# Patient Record
Sex: Male | Born: 1955 | Race: Black or African American | Hispanic: No | Marital: Single | Smoking: Never smoker
Health system: Southern US, Community
[De-identification: ages and names within clinical notes are randomized; demographics above are authoritative.]

## PROBLEM LIST (undated history)

## (undated) DIAGNOSIS — I10 Essential (primary) hypertension: Secondary | ICD-10-CM

## (undated) DIAGNOSIS — E785 Hyperlipidemia, unspecified: Secondary | ICD-10-CM

## (undated) DIAGNOSIS — G4733 Obstructive sleep apnea (adult) (pediatric): Secondary | ICD-10-CM

## (undated) DIAGNOSIS — I499 Cardiac arrhythmia, unspecified: Secondary | ICD-10-CM

## (undated) DIAGNOSIS — I2510 Atherosclerotic heart disease of native coronary artery without angina pectoris: Secondary | ICD-10-CM

## (undated) DIAGNOSIS — D649 Anemia, unspecified: Secondary | ICD-10-CM

## (undated) DIAGNOSIS — J301 Allergic rhinitis due to pollen: Secondary | ICD-10-CM

## (undated) DIAGNOSIS — E119 Type 2 diabetes mellitus without complications: Secondary | ICD-10-CM

## (undated) HISTORY — DX: Obstructive sleep apnea (adult) (pediatric): G47.33

## (undated) HISTORY — PX: PROSTATE BIOPSY: SHX241

## (undated) HISTORY — DX: Hyperlipidemia, unspecified: E78.5

## (undated) HISTORY — DX: Essential (primary) hypertension: I10

## (undated) HISTORY — DX: Allergic rhinitis due to pollen: J30.1

---

## 2001-05-12 ENCOUNTER — Encounter: Payer: Self-pay | Admitting: Family Medicine

## 2001-05-12 ENCOUNTER — Ambulatory Visit (HOSPITAL_COMMUNITY): Admission: RE | Admit: 2001-05-12 | Discharge: 2001-05-12 | Payer: Self-pay | Admitting: Family Medicine

## 2003-07-14 HISTORY — PX: OTHER SURGICAL HISTORY: SHX169

## 2004-02-06 ENCOUNTER — Ambulatory Visit (HOSPITAL_COMMUNITY): Admission: RE | Admit: 2004-02-06 | Discharge: 2004-02-06 | Payer: Self-pay | Admitting: Internal Medicine

## 2004-08-05 ENCOUNTER — Ambulatory Visit: Payer: Self-pay | Admitting: Internal Medicine

## 2004-08-11 ENCOUNTER — Ambulatory Visit: Payer: Self-pay | Admitting: Internal Medicine

## 2004-10-06 ENCOUNTER — Ambulatory Visit: Payer: Self-pay | Admitting: Internal Medicine

## 2004-10-28 ENCOUNTER — Ambulatory Visit: Payer: Self-pay | Admitting: Internal Medicine

## 2005-03-31 ENCOUNTER — Ambulatory Visit: Payer: Self-pay | Admitting: Internal Medicine

## 2005-07-29 ENCOUNTER — Ambulatory Visit: Payer: Self-pay | Admitting: Internal Medicine

## 2006-05-25 ENCOUNTER — Ambulatory Visit: Payer: Self-pay | Admitting: Internal Medicine

## 2006-07-02 ENCOUNTER — Ambulatory Visit: Payer: Self-pay | Admitting: Internal Medicine

## 2006-11-02 ENCOUNTER — Ambulatory Visit: Payer: Self-pay | Admitting: Internal Medicine

## 2007-04-22 ENCOUNTER — Ambulatory Visit: Payer: Self-pay | Admitting: Internal Medicine

## 2007-04-23 DIAGNOSIS — J3089 Other allergic rhinitis: Secondary | ICD-10-CM

## 2007-04-23 DIAGNOSIS — J302 Other seasonal allergic rhinitis: Secondary | ICD-10-CM

## 2007-06-27 ENCOUNTER — Ambulatory Visit: Payer: Self-pay | Admitting: Internal Medicine

## 2007-09-16 ENCOUNTER — Ambulatory Visit: Payer: Self-pay | Admitting: Internal Medicine

## 2008-01-31 ENCOUNTER — Ambulatory Visit: Payer: Self-pay | Admitting: Internal Medicine

## 2008-06-27 ENCOUNTER — Ambulatory Visit: Payer: Self-pay | Admitting: Internal Medicine

## 2008-08-14 ENCOUNTER — Ambulatory Visit: Payer: Self-pay | Admitting: Internal Medicine

## 2008-08-14 DIAGNOSIS — I10 Essential (primary) hypertension: Secondary | ICD-10-CM | POA: Insufficient documentation

## 2008-08-14 DIAGNOSIS — E785 Hyperlipidemia, unspecified: Secondary | ICD-10-CM | POA: Insufficient documentation

## 2008-12-07 ENCOUNTER — Ambulatory Visit: Payer: Self-pay | Admitting: Internal Medicine

## 2009-03-22 ENCOUNTER — Telehealth (INDEPENDENT_AMBULATORY_CARE_PROVIDER_SITE_OTHER): Payer: Self-pay | Admitting: *Deleted

## 2009-05-02 ENCOUNTER — Ambulatory Visit: Payer: Self-pay | Admitting: Internal Medicine

## 2009-08-04 ENCOUNTER — Emergency Department (HOSPITAL_COMMUNITY): Admission: EM | Admit: 2009-08-04 | Discharge: 2009-08-04 | Payer: Self-pay | Admitting: Emergency Medicine

## 2009-08-04 IMAGING — CR DG LUMBAR SPINE 2-3V
3 series · 3 of 3 positions shown · non-contrast
Comparison: None

CLINICAL DATA: Low back pain.

LUMBAR SPINE - 2-3 VIEW

[view not recorded (1 of 3)]
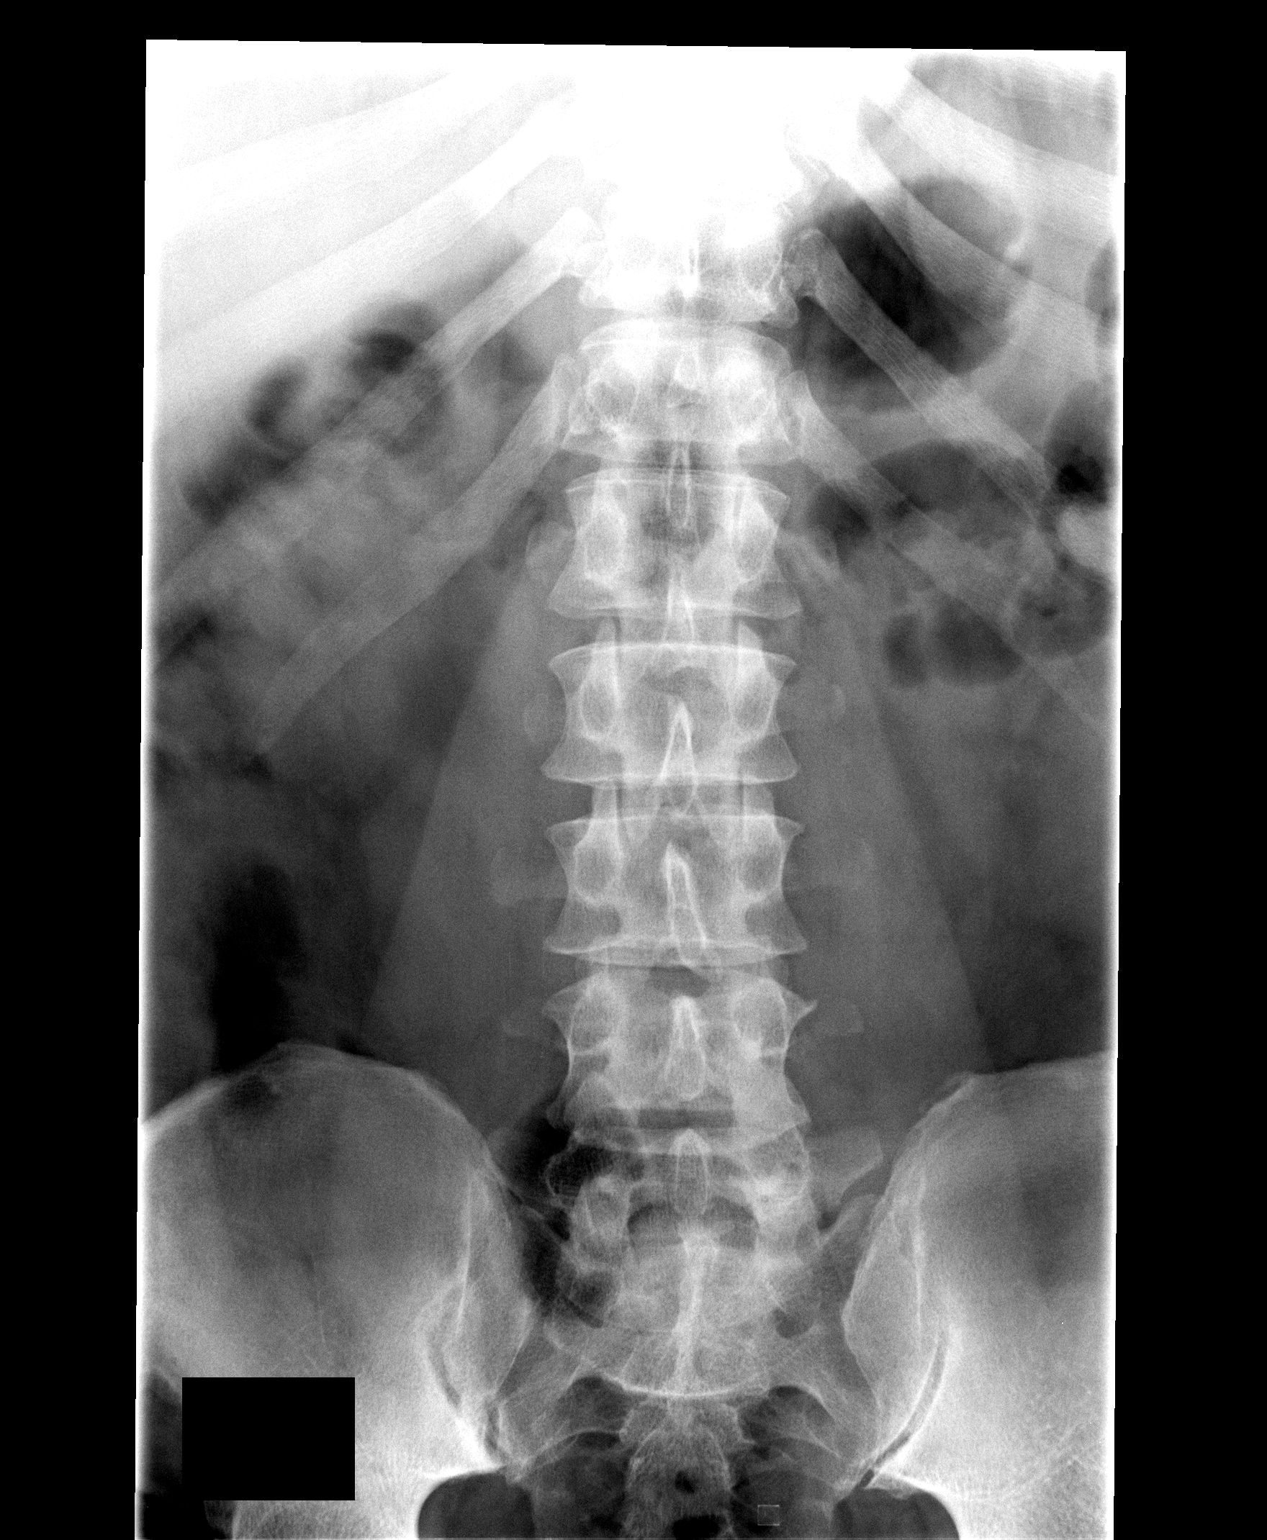

[view not recorded (2 of 3)]
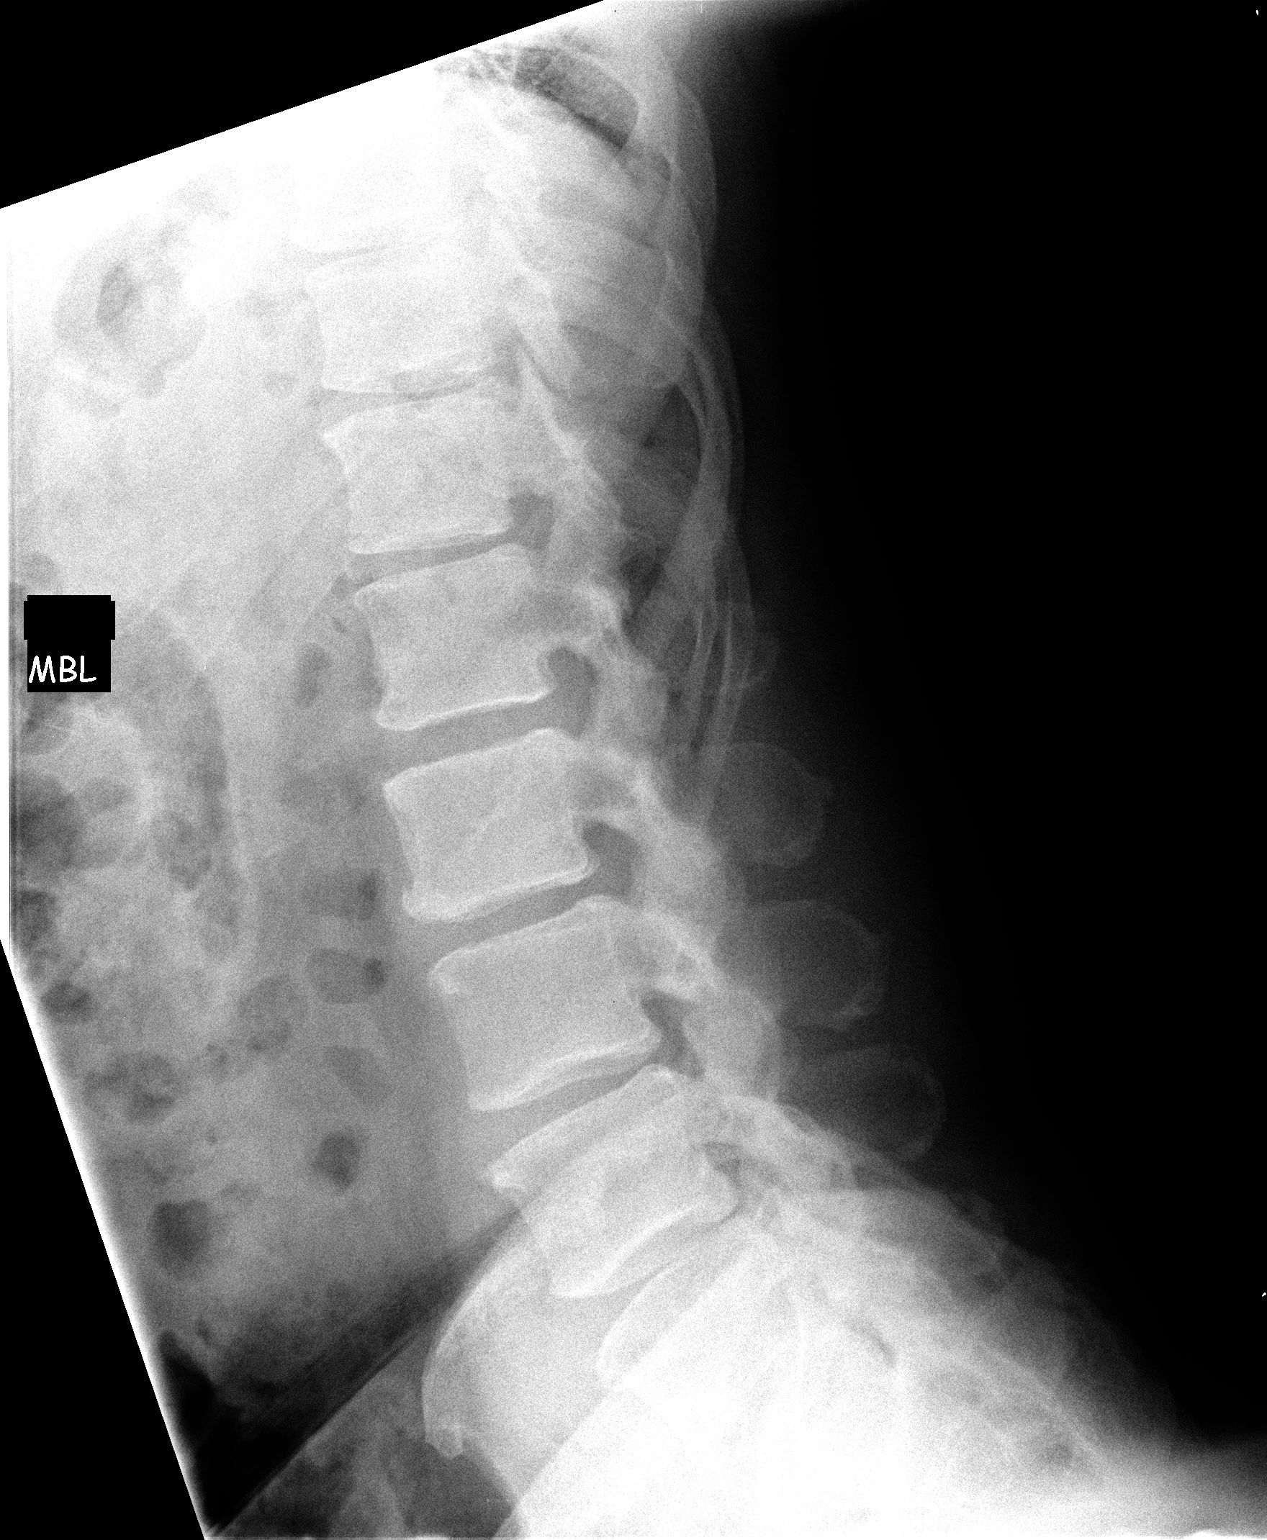

[view not recorded (3 of 3)]
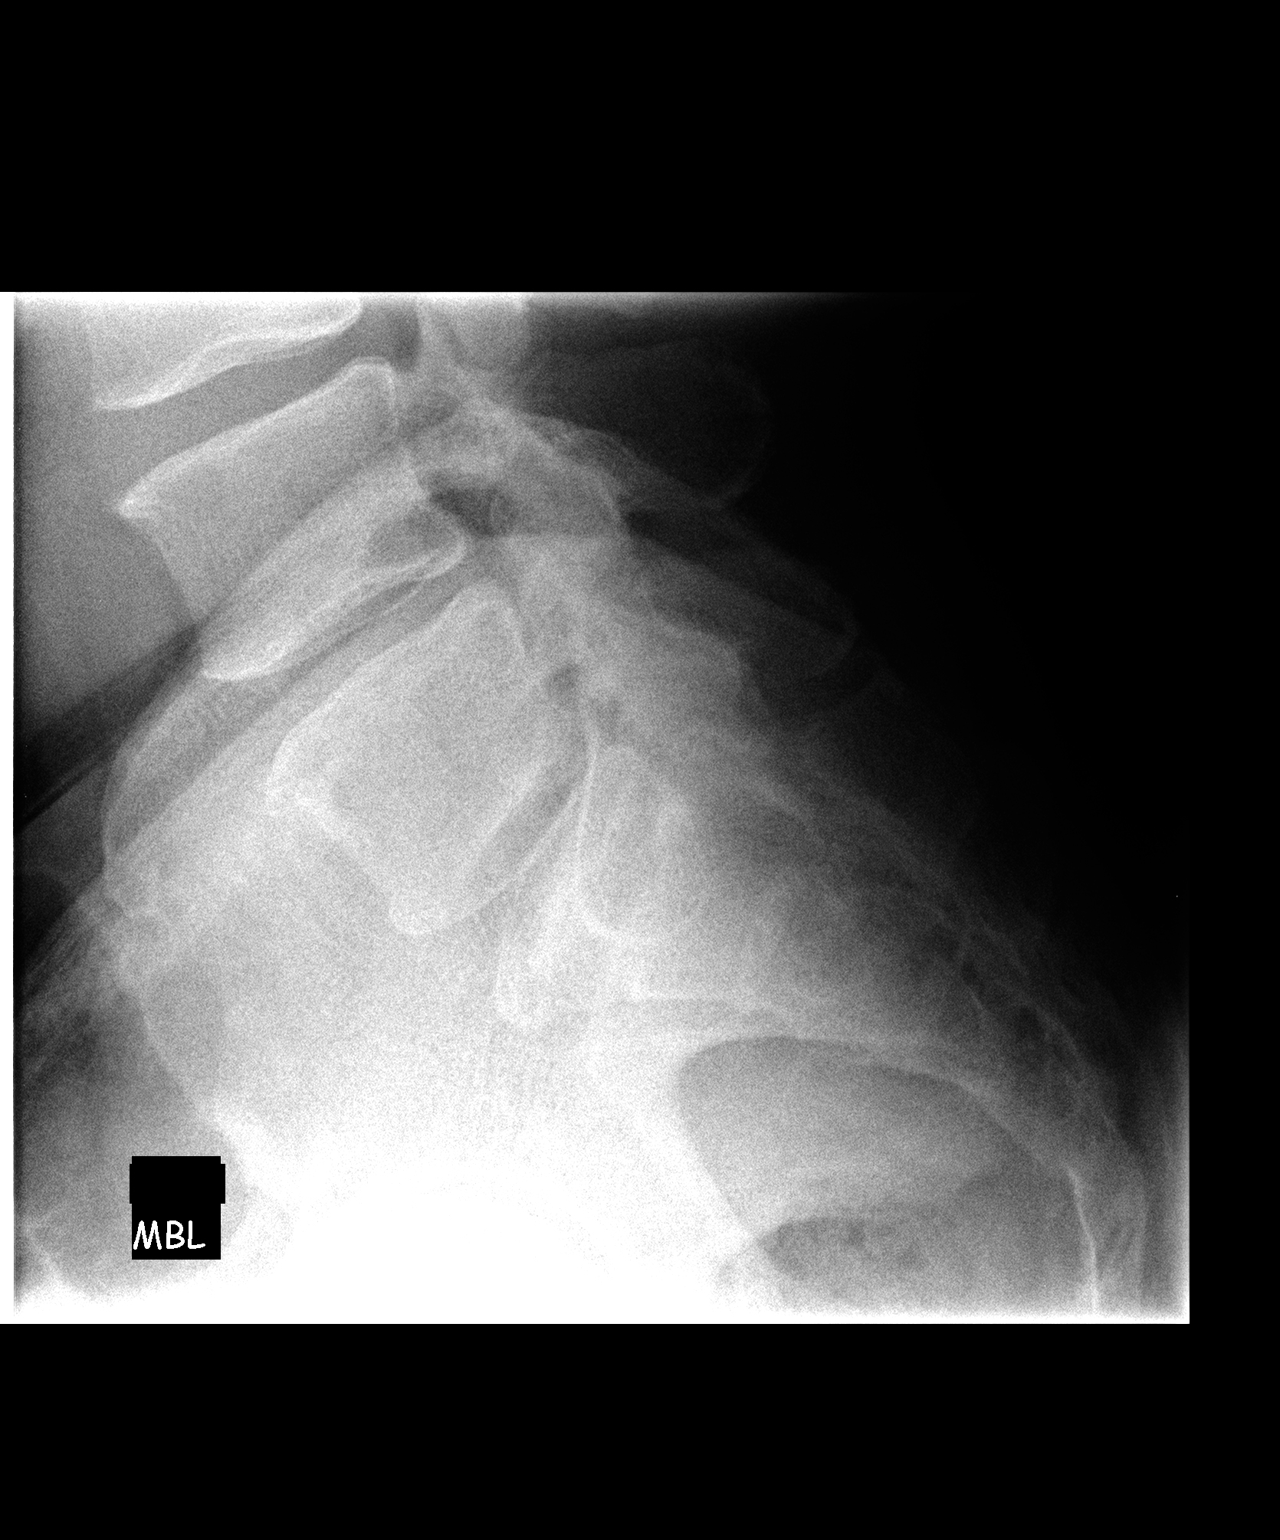

[3 of 3 positions shown; findings below may reference images not displayed]

FINDINGS: There is no evidence of lumbar spine fracture or
subluxation.  Mild degenerative disc disease is seen from levels of
L2-L5. No other significant bone abnormality identified.
IMPRESSION: 1.  No acute findings.
2.  Mild degenerative disc disease from levels of L2-L5.

## 2009-08-13 ENCOUNTER — Ambulatory Visit: Payer: Self-pay | Admitting: Internal Medicine

## 2009-08-14 ENCOUNTER — Emergency Department (HOSPITAL_COMMUNITY): Admission: EM | Admit: 2009-08-14 | Discharge: 2009-08-15 | Payer: Self-pay | Admitting: Emergency Medicine

## 2009-08-14 IMAGING — CR DG LUMBAR SPINE COMPLETE 4+V
5 series · 5 of 5 positions shown · non-contrast
Comparison: [DATE].

CLINICAL DATA: 53-year-old male with low back pain.  No known
injury.  No improvement.

LUMBAR SPINE - COMPLETE 4+ VIEW

[view not recorded (1 of 5)]
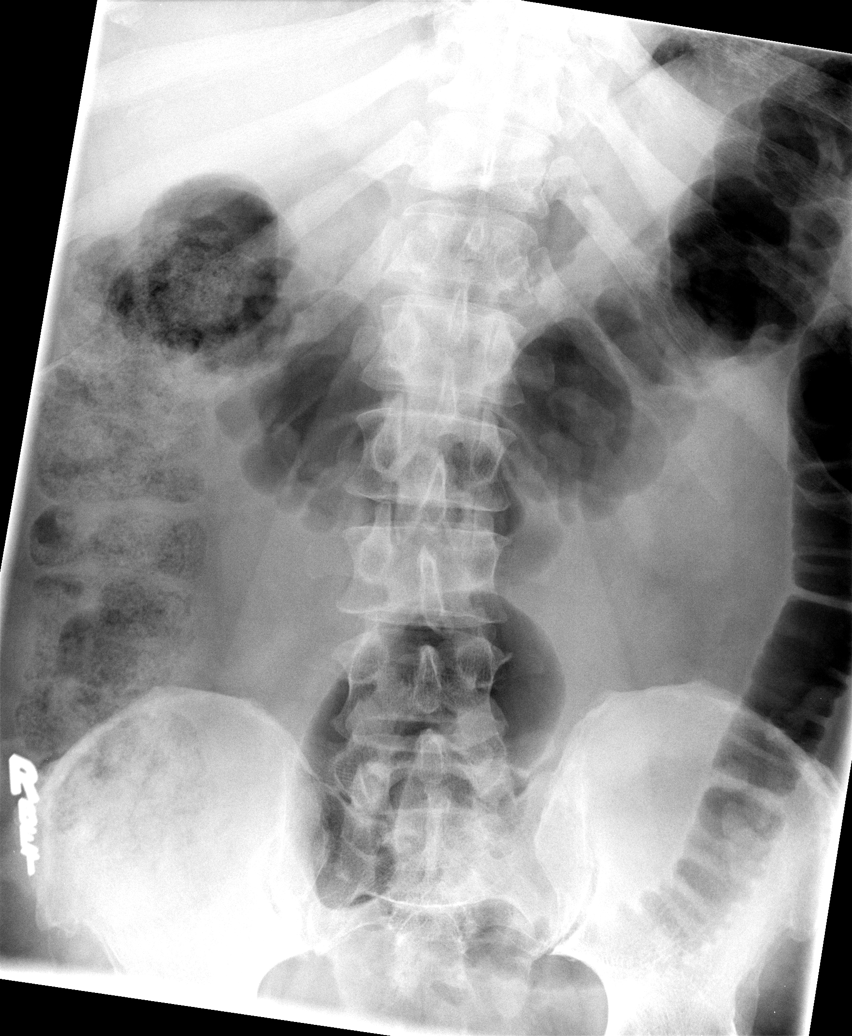

[view not recorded (2 of 5)]
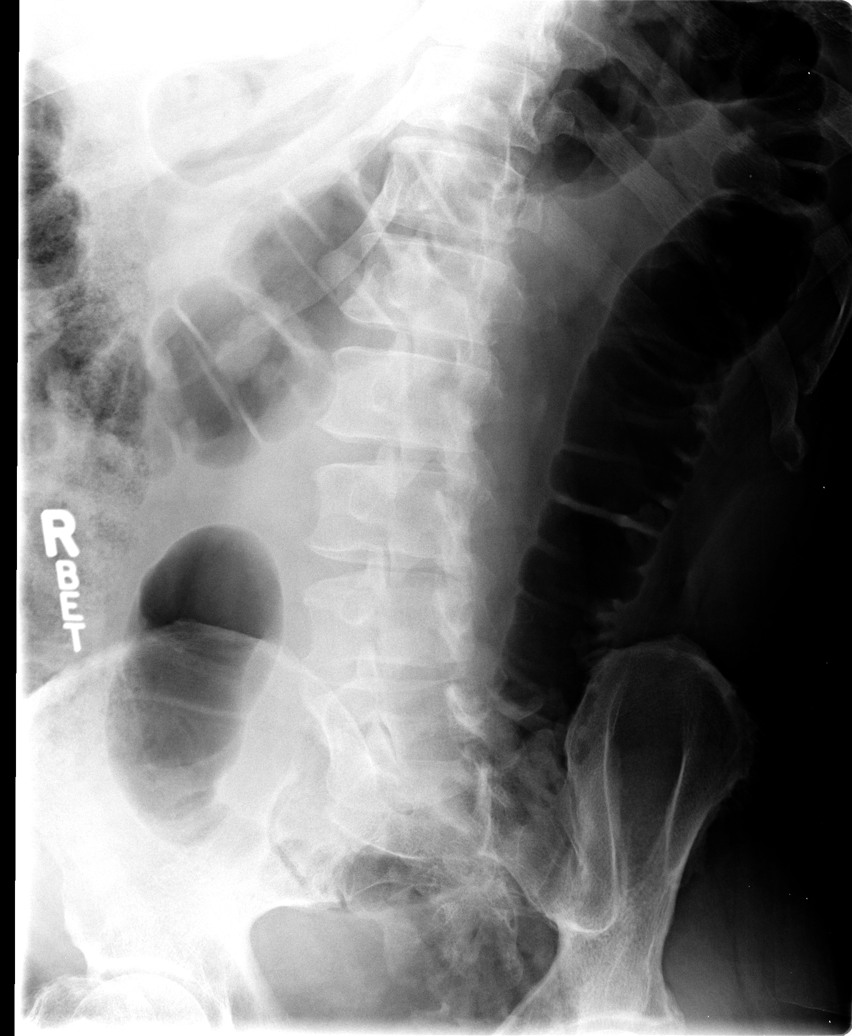

[view not recorded (3 of 5)]
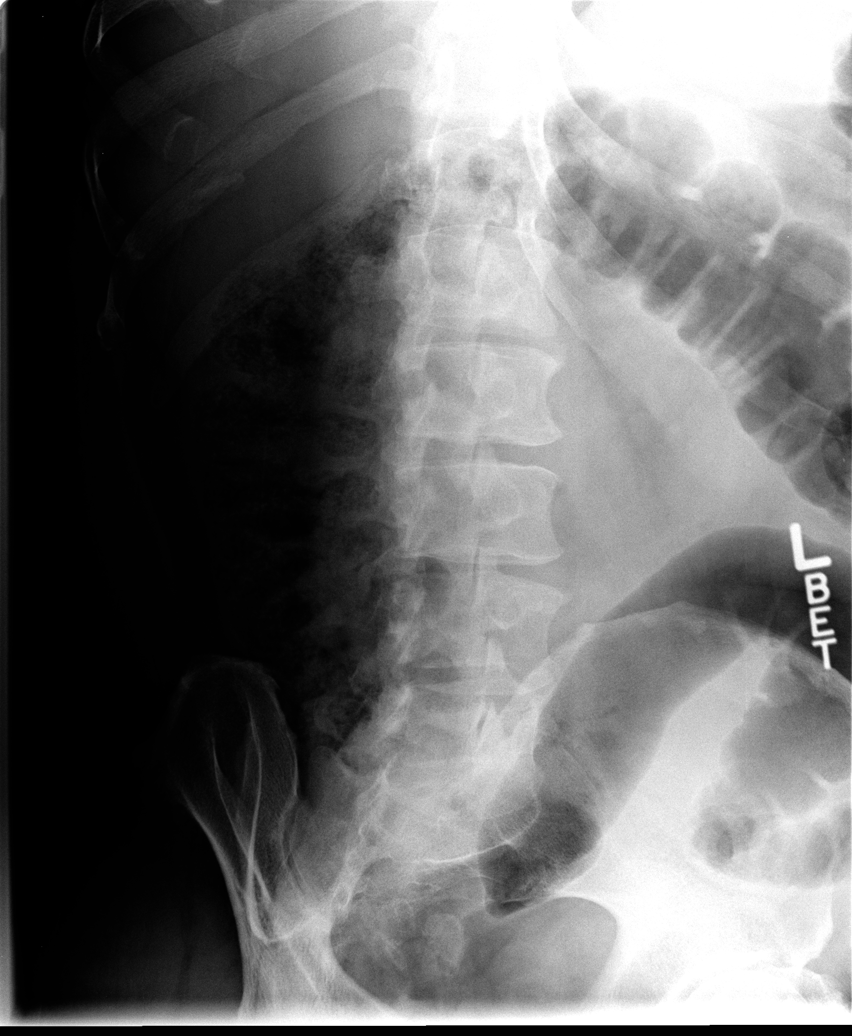

[view not recorded (4 of 5)]
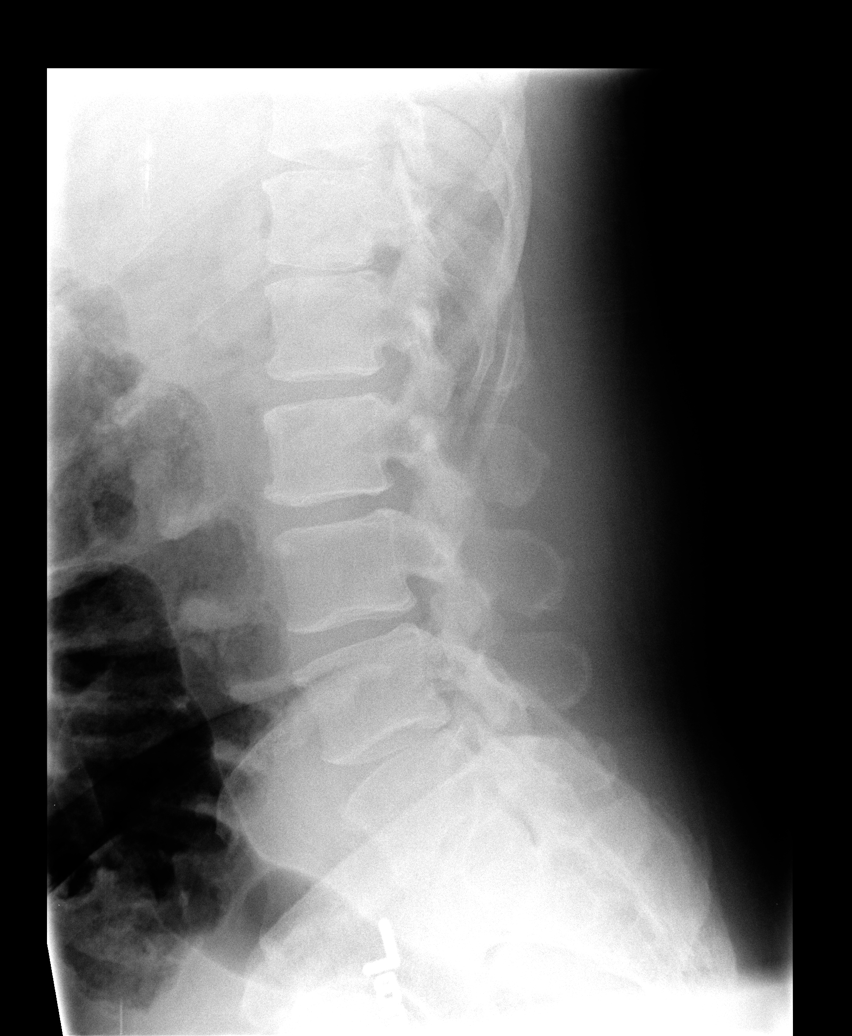

[view not recorded (5 of 5)]
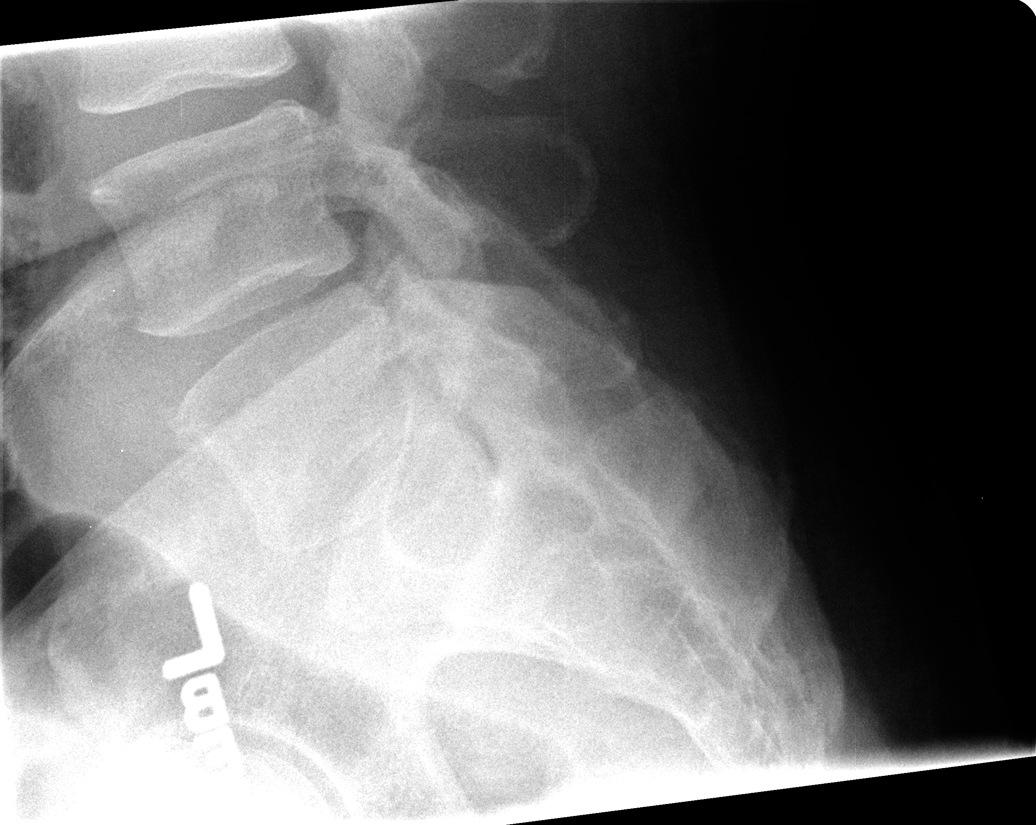

[5 of 5 positions shown; findings below may reference images not displayed]

FINDINGS: Stable, normal vertebral body height and alignment.
Normal lumbar segmentation again noted.  No pars fracture.
Relatively preserved disc spaces are stable.  Stable visualized
lower thoracic and pelvis structures.
IMPRESSION: No acute osseous abnormality identified in the lumbar spine.

## 2009-10-21 ENCOUNTER — Ambulatory Visit: Payer: Self-pay | Admitting: Internal Medicine

## 2010-03-12 ENCOUNTER — Ambulatory Visit: Payer: Self-pay | Admitting: Internal Medicine

## 2010-08-11 ENCOUNTER — Telehealth (INDEPENDENT_AMBULATORY_CARE_PROVIDER_SITE_OTHER): Payer: Self-pay | Admitting: *Deleted

## 2010-08-12 ENCOUNTER — Ambulatory Visit
Admission: RE | Admit: 2010-08-12 | Discharge: 2010-08-12 | Payer: Self-pay | Source: Home / Self Care | Attending: Internal Medicine | Admitting: Internal Medicine

## 2010-08-12 NOTE — Assessment & Plan Note (Signed)
Summary: 12 months/apc   Primary Provider/Referring Provider:  Alla German  CC:  follow up visit-no complaints..  History of Present Illness: 06/15/07- History of Present Illness: Allergic rhinitis follow-up Conntinues allergy vaccine 1:50 at [facility] in [place] able to work outside. 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 In summer freshcut grass, fall leaves transient nasal stuffiness.No need antihistami esw etc. No longer defined seasonal problems. Some nasal congestion in AMs, clear by 10:00AM. All nasal- no purulent, no headache, eyes ok. Never wheeze.  08/14/08- Allergic rhinitis Continues to do  very well with allergy vaccine. Gets at 1:50 given at his primary office in [place] with no concerns or reactions. Occasional minor cough, not bothersome and no wheeze or asthma concern. No longer seasonal symptoms- more mildly perennial.  August 13, 2009- Allergic rhinitis  He continues to get his allergy injections at medical office in [place] and says he is doing very well.  I noted his lisinopril- he denies any cough or hives. He has no reactions to his shots.      Current Medications (verified): 1)  Allergy Vaccine 1: 10 .... Gets Inj in [place] 2)  Aspirin 81 Mg Tbec (Aspirin) .... Take 1 By Mouth Once Daily 3)  Simvastatin 80 Mg  Tabs (Simvastatin) .... Use One By Mouth Once Daily 4)  Enalapril Maleate 20 Mg  Tabs (Enalapril Maleate) 5)  Hydrochlorothiazide 25 Mg  Tabs (Hydrochlorothiazide) .... Take 1/4 Tablet By Mouth Once Daily 6)  Nexium 40 Mg  Cpdr (Esomeprazole Magnesium) .... Take 1 By Mouth Once Daily 7)  Vitamin C-Rose Hips 500 Mg  Tabs (Ascorbic Acid) .... Take 1 By Mouth Once Daily 8)  B-100 Complex   Tabs (Vitamins-Lipotropics) .... Take 1 By Mouth Once Daily 9)  Diltiazem Hcl Coated Beads 240 Mg Xr24h-Cap (Diltiazem Hcl Coated Beads) .... 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         He is guarding sore left hip today Educated on potential for sleep apnea.  Vital Signs:  Patient profile:   55 year old male Weight:      270.25 pounds O2 Sat:      100 % on Room air Pulse rate:   70 / minute BP sitting:   124 / 80  (left arm) Cuff size:   large  Vitals Entered By: Reynaldo Minium CMA (August 13, 2009 3:05 PM)  O2 Flow:  Room air  Physical Exam  Additional Exam:  General: A/Ox3; pleasant and cooperative, NAD, overweight, SKIN: no rash, lesions NODES: no lymphadenopathy HEENT: [place]/AT, EOM- WNL, Conjuctivae- clear, PERRLA, TM-WNL, Nose- clear, Throat- clear and wnl, Melampatti II NECK: Supple w/ fair ROM, JVD- none, normal carotid impulses w/o bruits Thyroid- normal to palpation CHEST: Clear to P&A HEART: RRR, no m/g/r heard ABDOMEN: Soft and  nl; EAV:WUJW, nl pulses, no edema  NEURO: Grossly intact to observation      Impression & Recommendations:  Problem # 1:  RHINITIS, ALLERGIC, 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 Take 1 by mouth once daily  Other Orders: Est. Patient Level II  (11914)  Patient Instructions: 1)  Schedule return in one year, earlier if needed 2)  Continue allergy  vaccine. Call if you have questions or problems.

## 2010-08-20 NOTE — Progress Notes (Signed)
Summary: allergy medication  Phone Note Call from Patient   Caller: Patient Call For: DR YOUNG Summary of Call: Patient phoned stated that he has an appointment tomorrow with Dr. Maple Hudson and he is out of his allegry injections and he has lost the little form sheet. He has no idea where he put it. He wants to know if it can pick it up tomorrow at his appointment. He can be reached at [phone]  Initial call taken by: Vedia Coffer,  August 11, 2010 10:03 AM  Follow-up for Phone Call        Will forward message to Tammy S in [facility] so she can pull this info for pt to pick up tomorrow. Gweneth Dimitri RN  August 11, 2010 10:51 AM   Additional Follow-up for Phone Call Additional follow up Details #1::        Called pt. this morning left a message. He called me back; I told him we could have his vaccine ready for him to pick-up after his appt.tomorrow. Additional Follow-up by: Dimas Millin,  August 11, 2010 4:57 PM

## 2010-08-20 NOTE — Assessment & Plan Note (Signed)
Summary: 12 months/apc   Primary Provider/Referring Provider:  Gareth Morgan  CC:  55 yr followup and coughing  x 2 weeksat night much better this week .  History of Present Illness: 08/14/08- Allergic rhinitis Continues to do  very well with allergy vaccine. Gets at 1:50 given at his primary office in [place] with no concerns or reactions. Occasional minor cough, not bothersome and no wheeze or asthma concern. No longer seasonal symptoms- more mildly perennial.  August 13, 2009- Allergic rhinitis  He continues to get his allergy injections at medical office in [place] and says he is doing very well.  I noted his lisinopril- he denies any cough or hives. He has no reactions to his shots.  August 12, 2010-  Allergic rhinitis Nurse-CC: 55 yr followup, coughing  x 2 weeks at night much better this week  Allergy vaccine is given with no problems through a  clinic in [place] and he says this works very well.  Recent sinus cold x weeks- improving with otc remedies. He again denies chronic cough or issues pertinent to his ACE inhibitor. Denies hx of asthma. 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 He has no desire to change current management.     Preventive Screening-Counseling & Management  Alcohol-Tobacco     Smoking Status: never  Current Medications (verified): 1)  Allergy Vaccine 1: 10 .... Gets Inj in [place] 2)  Aspirin 81 Mg Tbec (Aspirin) .... Take 1 By Mouth Once Daily 3)  Simvastatin 80 Mg  Tabs (Simvastatin) .... Use One By Mouth Once Daily 4)  Enalapril Maleate 20 Mg  Tabs (Enalapril Maleate) 5)  Hydrochlorothiazide 50 Mg Tabs (Hydrochlorothiazide) .... 1/4 Tab Daily 6)  Nexium 40 Mg  Cpdr (Esomeprazole Magnesium) .... Take 1 By Mouth Once Daily 7)  Vitamin C-Rose Hips 500 Mg  Tabs (Ascorbic Acid) .... Take 1 By Mouth Once Daily 8)  B-100 Complex   Tabs (Vitamins-Lipotropics) .... Take 1 By Mouth Once Daily 9)  Diltiazem Hcl Coated Beads 240 Mg  Xr24h-Cap (Diltiazem Hcl Coated Beads) .... 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  The patient denies shortness of breath with activity, shortness of breath at rest, productive cough, coughing up blood, chest pain, irregular heartbeats, acid heartburn, indigestion, loss of appetite, weight change, abdominal pain, difficulty swallowing, sore throat, tooth/dental problems, headaches, and sneezing.    Vital Signs:  Patient profile:   55 year old male Height:      67 inches Weight:      271.38 pounds BMI:     42.66 BP sitting:   130 / 62  (left arm) Cuff size:   large  Vitals Entered By: Reynaldo Minium CMA (August 12, 2010 3:12 PM) CC: 55 yr followup, coughing  x 2 weeksat night much better this week    Physical Exam  Additional Exam:  General: A/Ox3;  pleasant and cooperative, NAD, overweight, SKIN: no rash, lesions NODES: no lymphadenopathy HEENT: /AT, EOM- WNL, Conjuctivae- clear, PERRLA, TM-WNL, Nose- clear, Throat- clear and wnl, Mallampati III-IV NECK: Supple w/ fair ROM, JVD- none, normal carotid impulses w/o bruits Thyroid- normal to palpation CHEST: Clear to P&A HEART: RRR, no m/g/r heard ABDOMEN: Soft and nl; ZHY:QMVH, nl pulses, no edema  NEURO: Grossly intact to observation      Impression &  Recommendations:  Problem # 1:  RHINITIS, ALLERGIC, 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 I don't think he is having problems with cough from enalapril, but cough is common enough ACEI side effect to discuss with that in mind Incidental cold now, is clearing.  Watch for sleep apnea because of body habitus. He denies tiredness, witnessed apnea, or concern.    Orders: Est. Patient Level III (84696)  Medications Added to Medication List This Visit: 1)  Hydrochlorothiazide 50 Mg Tabs (Hydrochlorothiazide) .... 1/4 tab daily  Patient Instructions: 1)  Please schedule a follow-up appointment in 1 year. Please call as needed. 2)  Continue allergy vaccine

## 2010-11-28 NOTE — Op Note (Signed)
NAME:  Andrew Arnold, Andrew Arnold                      ACCOUNT NO.:  [id number]   MEDICAL RECORD NO.:  [id number]                   PATIENT TYPE:  AMB   LOCATION:  DAY                                  FACILITY:  [facility]   PHYSICIAN:  R. Roetta Sessions, M.D.              DATE OF BIRTH:  01/12/1956   DATE OF PROCEDURE:  02/06/2004  DATE OF DISCHARGE:                                 OPERATIVE REPORT   INDICATIONS FOR PROCEDURE:  The patient is a 55 year old gentleman devoid  any upper GI tract symptoms.  He was sent __________ by Dr. Gareth Morgan  for colorectal screening via colonoscopy.  There is no family history of  colorectal neoplasia and he has never had his colon examined.  This  colonoscopy is now being done as a standard for screening maneuver.  The  __________ potential risks, benefits, and alternatives have been reviewed,  please see my handwritten H&P.   PROCEDURE:  Screening colonoscopy.   PROCEDURE NOTE:  O2 saturation, blood pressure, pulse, and respirations were  monitored throughout the entire procedure.   CONSCIOUS SEDATION:  Versed 3 mg IV and Demerol 75 mg IV.   INSTRUMENT:  Olympus video chip system.   FINDINGS:  Digital rectal examination revealed no abnormalities.  Endoscopic  findings:  The prep was excellent.  In the rectum, examination of the rectal  mucosa, including retroflexion to the anal verge revealed no abnormalities.   COLON:  The colonic mucosa was surveyed from the rectosigmoid junction  through the left, transverse and right colon to the appendiceal orifice,  ileocecal valve, and cecum.  The terminal ileum was easily intubated as  well.  Pictures of the ileocecal valve, cecum, and appendiceal orifice were  taken for the record.  From this level, the scope was slowly withdrawn and  all previously mentioned mucosal surfaces were again seen.  There was a  straight shot to the cecum, the scope was easily advanced to the cecum and  back, and the colonic  mucosa appeared entirely normal except for one or two  small sigmoid diverticula.  The terminal ileum appeared normal.   The patient tolerated the procedure well and was reacted after endoscopy.   IMPRESSION:  1. Normal rectum.  2. A couple of sigmoid diverticula in colonic mucosa and terminal ileum     mucosa appeared normal.   RECOMMENDATIONS:  Repeat colonoscopy in 10 years.      ___________________________________________                                            Andrew Arnold, M.D.   RMR/MEDQ  D:  02/06/2004  T:  02/06/2004  Job:  [id number]   cc:   Mila Homer. Sudie Bailey, M.D.  [address] [place], [place] [address]  Fax: [phone]

## 2010-11-28 NOTE — Assessment & Plan Note (Signed)
[facility]                             PULMONARY OFFICE NOTE   NAME:Andrew Arnold, Andrew Arnold                   MRN:          [id number]  DATE:07/02/2006                            DOB:          11/25/1955    PROBLEM:  Allergic rhinitis.   HISTORY:  Andrew Arnold comes in for a one-year followup, doing well.  He  feels that his allergy vaccine control is good enough on the current  strength at 1:50, with no reactions or problems.  He gets his shots at  his primary office, [facility] in [place].  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  No asthma.  He says he can tell is he misses a shot, because he gets  more head congestion.  Overall there has been no progression.  He is  feeling very stable.   MEDICATIONS:  1. Allergy vaccine.  2. Multivitamins.  3. Aspirin 325 mg.  4. Hydrochlorothiazide.  5. Simvastatin 80 mg.  6. Enalapril 20 mg.   ALLERGIES:  No known drug allergies.   OBJECTIVE:  VITAL SIGNS:  Weight 250 pounds, blood pressure 122/64,  pulse regular 67, room air saturation 97%.  HEENT:  There is turbinate edema with clear secretions.  Palate length  is 3 to 4/4.  I discussed warning signals for sleep apnea for him to  watch for, based on his body build.  LUNGS:  Very clear.  HEART:  Sounds regular, without murmur or gallop.   IMPRESSION:  Allergic rhinitis, under stable control.   PLAN:  To continue the vaccine at 1:50.  Schedule a return in one year,  earlier p.r.n.     Clinton D. Maple Hudson, MD, Tonny Bollman, FACP  Electronically Signed    CDY/MedQ  DD: 07/07/2006  DT: 07/07/2006  Job #: [id number]

## 2010-12-11 ENCOUNTER — Ambulatory Visit (INDEPENDENT_AMBULATORY_CARE_PROVIDER_SITE_OTHER): Payer: Self-pay

## 2010-12-11 DIAGNOSIS — J309 Allergic rhinitis, unspecified: Secondary | ICD-10-CM

## 2011-04-16 ENCOUNTER — Ambulatory Visit (INDEPENDENT_AMBULATORY_CARE_PROVIDER_SITE_OTHER): Payer: Self-pay

## 2011-04-16 DIAGNOSIS — J309 Allergic rhinitis, unspecified: Secondary | ICD-10-CM

## 2011-08-12 ENCOUNTER — Encounter: Payer: Self-pay | Admitting: Internal Medicine

## 2011-08-13 ENCOUNTER — Encounter: Payer: Self-pay | Admitting: Internal Medicine

## 2011-08-13 ENCOUNTER — Ambulatory Visit (INDEPENDENT_AMBULATORY_CARE_PROVIDER_SITE_OTHER): Payer: BC Managed Care – PPO | Admitting: Internal Medicine

## 2011-08-13 VITALS — BP 128/76 | HR 76 | Ht 67.0 in | Wt 283.6 lb

## 2011-08-13 DIAGNOSIS — J301 Allergic rhinitis due to pollen: Secondary | ICD-10-CM

## 2011-08-13 NOTE — Progress Notes (Signed)
08/13/11- 55 yoM never smoker followed for allergic rhinitis complicated by hypertension He continues to get his allergy vaccine injections through a clinic in [place]. He is strongly convinced they help him. He says if he misses one or 2 shots, he begins to notice significant increase in nasal congestion and rhinorrhea. He has no lower respiratory problems including wheezing, chest pain or any cough attributable to his ACE inhibitor. He is prone to increased nasal symptoms season changes, especially spring and fall. We discussed use of a supplemental antihistamine if necessary.  ROS-see HPI Constitutional:   No-   weight loss, night sweats, fevers, chills, fatigue, lassitude. HEENT:   No-  headaches, difficulty swallowing, tooth/dental problems, sore throat,       No-  sneezing, itching, ear ache, nasal congestion, post nasal drip,  CV:  No-   chest pain, orthopnea, PND, swelling in lower extremities, anasarca, dizziness, palpitations Resp: No-   shortness of breath with exertion or at rest.              No-   productive cough,  No non-productive cough,  No- coughing up of blood.              No-   change in color of mucus.  No- wheezing.   Skin: No-   rash or lesions. GI:  No-   heartburn, indigestion, abdominal pain, nausea, vomiting, diarrhea,                 change in bowel habits, loss of appetite GU: MS:  No-   joint pain or swelling.  No- decreased range of motion.  No- back pain. Neuro-     nothing unusual Psych:  No- change in mood or affect. No depression or anxiety.  No memory loss.   OBJ- Physical Exam General- Alert, Oriented, Affect-appropriate, Distress- none acute, obese Skin- rash-none, lesions- none, excoriation- none Lymphadenopathy- none Head- atraumatic            Eyes- Gross vision intact, PERRLA, conjunctivae and secretions clear            Ears- Hearing, canals-normal            Nose- Clear, no-Septal dev, mucus, polyps, erosion, perforation   Throat- Mallampati II , mucosa clear , drainage- none, tonsils- atrophic Neck- flexible , trachea midline, no stridor , thyroid nl, carotid no bruit Chest - symmetrical excursion , unlabored           Heart/CV- RRR , no murmur , no gallop  , no rub, nl s1 s2                           - JVD- none , edema- none, stasis changes- none, varices- none           Lung- clear to P&A, wheeze- none, cough- none , dullness-none, rub- none           Chest wall-  Abd- tender-no, distended-no, bowel sounds-present, HSM- no Br/ Gen/ Rectal- Not done, not indicated Extrem- cyanosis- none, clubbing, none, atrophy- none, strength- nl Neuro- grossly intact to observation

## 2011-08-13 NOTE — Patient Instructions (Signed)
Continue allergy vaccine  Ok to ad an otc antihistamine like claritin/ zyrtec/ allegra at any time if needed

## 2011-08-13 NOTE — Assessment & Plan Note (Signed)
History please with his results on allergy vaccine and will continue indefinitely. He recognizes the difference between allergy exacerbation and viral respiratory infections, but he has avoided this winter.

## 2011-08-14 ENCOUNTER — Ambulatory Visit (INDEPENDENT_AMBULATORY_CARE_PROVIDER_SITE_OTHER): Payer: BC Managed Care – PPO

## 2011-08-14 DIAGNOSIS — J309 Allergic rhinitis, unspecified: Secondary | ICD-10-CM

## 2011-12-17 ENCOUNTER — Ambulatory Visit (INDEPENDENT_AMBULATORY_CARE_PROVIDER_SITE_OTHER): Payer: BC Managed Care – PPO

## 2011-12-17 DIAGNOSIS — J309 Allergic rhinitis, unspecified: Secondary | ICD-10-CM

## 2012-04-28 ENCOUNTER — Ambulatory Visit (INDEPENDENT_AMBULATORY_CARE_PROVIDER_SITE_OTHER): Payer: BC Managed Care – PPO

## 2012-04-28 DIAGNOSIS — J309 Allergic rhinitis, unspecified: Secondary | ICD-10-CM

## 2012-08-12 ENCOUNTER — Ambulatory Visit (INDEPENDENT_AMBULATORY_CARE_PROVIDER_SITE_OTHER): Payer: BC Managed Care – PPO | Admitting: Internal Medicine

## 2012-08-12 ENCOUNTER — Encounter: Payer: Self-pay | Admitting: Internal Medicine

## 2012-08-12 VITALS — BP 150/92 | HR 76 | Ht 67.0 in | Wt 262.2 lb

## 2012-08-12 DIAGNOSIS — J301 Allergic rhinitis due to pollen: Secondary | ICD-10-CM

## 2012-08-12 NOTE — Patient Instructions (Addendum)
We can continue allergy vaccine 1:10.   Please have your [facility] staff call us if there are any questions about your allergy shots.  It would be ok for you to add an antihistamine for nasal allergy if needed. Good over-the- counter ones would be Claritin/ loratadine or Allegra/ fexofenadine  Please call as needed

## 2012-08-12 NOTE — Progress Notes (Signed)
08/13/11- 55 yoM never smoker followed for allergic rhinitis complicated by hypertension He continues to get his allergy vaccine injections through a clinic in [place]. He is strongly convinced they help him. He says if he misses one or 2 shots, he begins to notice significant increase in nasal congestion and rhinorrhea. He has no lower respiratory problems including wheezing, chest pain or any cough attributable to his ACE inhibitor. He is prone to increased nasal symptoms season changes, especially spring and fall. We discussed use of a supplemental antihistamine if necessary.  08/12/12- 56 yoM never smoker followed for allergic rhinitis complicated by hypertension follows for :still on vaccine. still getting in [place].  Allergy vaccine 1:10 given at [facility] in [place], weekly without problems. He is satisfied that it helps. Spring and fall seasons are worst. No asthma. Risk-benefit discussion done.  ROS-see HPI Constitutional:   No-   weight loss, night sweats, fevers, chills, fatigue, lassitude. HEENT:   No-  headaches, difficulty swallowing, tooth/dental problems, sore throat,       No-  sneezing, itching, ear ache, nasal congestion, post nasal drip,  CV:  No-   chest pain, orthopnea, PND, swelling in lower extremities, anasarca, dizziness, palpitations Resp: No-   shortness of breath with exertion or at rest.              No-   productive cough,  No non-productive cough,  No- coughing up of blood.              No-   change in color of mucus.  No- wheezing.   Skin: No-   rash or lesions. GI:  No-   heartburn, indigestion, abdominal pain, nausea, vomiting,  GU: MS:  No-   joint pain or swelling.  . Neuro-     nothing unusual Psych:  No- change in mood or affect. No depression or anxiety.  No memory loss.   OBJ- Physical Exam General- Alert, Oriented, Affect-appropriate, Distress- none acute, obese Skin- rash-none, lesions- none, excoriation- none Lymphadenopathy-  none Head- atraumatic            Eyes- Gross vision intact, PERRLA, conjunctivae and secretions clear            Ears- Hearing, canals-normal            Nose- Clear, no-Septal dev, mucus, polyps, erosion, perforation             Throat- Mallampati III , mucosa clear , drainage- none, tonsils- atrophic Neck- flexible , trachea midline, no stridor , thyroid nl, carotid no bruit Chest - symmetrical excursion , unlabored           Heart/CV- RRR , no murmur , no gallop  , no rub, nl s1 s2                           - JVD- none , edema- none, stasis changes- none, varices- none           Lung- clear to P&A, wheeze- none, cough- none , dullness-none, rub- none           Chest wall-  Abd-  Br/ Gen/ Rectal- Not done, not indicated Extrem- cyanosis- none, clubbing, none, atrophy- none, strength- nl Neuro- grossly intact to observation

## 2012-08-15 ENCOUNTER — Ambulatory Visit (INDEPENDENT_AMBULATORY_CARE_PROVIDER_SITE_OTHER): Payer: BC Managed Care – PPO

## 2012-08-15 DIAGNOSIS — J309 Allergic rhinitis, unspecified: Secondary | ICD-10-CM

## 2012-08-20 NOTE — Assessment & Plan Note (Signed)
He believes he get symptomatic if he misses a couple of weeks. He intends to continue shots. Discussed safety. Discussed supplementation with antihistamine if needed

## 2012-12-14 ENCOUNTER — Ambulatory Visit (INDEPENDENT_AMBULATORY_CARE_PROVIDER_SITE_OTHER): Payer: BC Managed Care – PPO

## 2012-12-14 DIAGNOSIS — J309 Allergic rhinitis, unspecified: Secondary | ICD-10-CM

## 2013-04-28 ENCOUNTER — Ambulatory Visit (INDEPENDENT_AMBULATORY_CARE_PROVIDER_SITE_OTHER): Payer: BC Managed Care – PPO

## 2013-04-28 DIAGNOSIS — J309 Allergic rhinitis, unspecified: Secondary | ICD-10-CM

## 2013-08-23 ENCOUNTER — Ambulatory Visit (INDEPENDENT_AMBULATORY_CARE_PROVIDER_SITE_OTHER): Payer: BC Managed Care – PPO

## 2013-08-23 DIAGNOSIS — J309 Allergic rhinitis, unspecified: Secondary | ICD-10-CM

## 2013-11-12 ENCOUNTER — Emergency Department (HOSPITAL_COMMUNITY): Payer: BC Managed Care – PPO

## 2013-11-12 ENCOUNTER — Observation Stay (HOSPITAL_COMMUNITY)
Admission: EM | Admit: 2013-11-12 | Discharge: 2013-11-14 | Disposition: A | Discharge status: Home / Self Care | Payer: BC Managed Care – PPO | Attending: Family Medicine | Admitting: Family Medicine

## 2013-11-12 ENCOUNTER — Encounter (HOSPITAL_COMMUNITY): Payer: Self-pay | Admitting: Emergency Medicine

## 2013-11-12 DIAGNOSIS — Z7982 Long term (current) use of aspirin: Secondary | ICD-10-CM | POA: Diagnosis not present

## 2013-11-12 DIAGNOSIS — Z79899 Other long term (current) drug therapy: Secondary | ICD-10-CM | POA: Diagnosis not present

## 2013-11-12 DIAGNOSIS — I10 Essential (primary) hypertension: Secondary | ICD-10-CM | POA: Insufficient documentation

## 2013-11-12 DIAGNOSIS — N289 Disorder of kidney and ureter, unspecified: Secondary | ICD-10-CM

## 2013-11-12 DIAGNOSIS — E785 Hyperlipidemia, unspecified: Secondary | ICD-10-CM | POA: Insufficient documentation

## 2013-11-12 DIAGNOSIS — J189 Pneumonia, unspecified organism: Secondary | ICD-10-CM | POA: Diagnosis present

## 2013-11-12 DIAGNOSIS — J159 Unspecified bacterial pneumonia: Secondary | ICD-10-CM | POA: Diagnosis not present

## 2013-11-12 DIAGNOSIS — R404 Transient alteration of awareness: Secondary | ICD-10-CM | POA: Diagnosis present

## 2013-11-12 DIAGNOSIS — R55 Syncope and collapse: Secondary | ICD-10-CM | POA: Diagnosis not present

## 2013-11-12 DIAGNOSIS — J301 Allergic rhinitis due to pollen: Secondary | ICD-10-CM

## 2013-11-12 HISTORY — DX: Cardiac arrhythmia, unspecified: I49.9

## 2013-11-12 LAB — I-STAT CG4 LACTIC ACID, ED: LACTIC ACID, VENOUS: 2.22 mmol/L — AB (ref 0.5–2.2)

## 2013-11-12 LAB — TROPONIN I

## 2013-11-12 LAB — COMPREHENSIVE METABOLIC PANEL
ALT: 88 U/L — ABNORMAL HIGH (ref 0–53)
AST: 50 U/L — ABNORMAL HIGH (ref 0–37)
Albumin: 3.9 g/dL (ref 3.5–5.2)
Alkaline Phosphatase: 178 U/L — ABNORMAL HIGH (ref 39–117)
BUN: 25 mg/dL — AB (ref 6–23)
CALCIUM: 9.3 mg/dL (ref 8.4–10.5)
CO2: 22 mEq/L (ref 19–32)
CREATININE: 1.74 mg/dL — AB (ref 0.50–1.35)
Chloride: 96 mEq/L (ref 96–112)
GFR calc Af Amer: 48 mL/min — ABNORMAL LOW (ref >90)
GFR calc non Af Amer: 41 mL/min — ABNORMAL LOW (ref >90)
GLUCOSE: 139 mg/dL — AB (ref 70–99)
Potassium: 3 mEq/L — ABNORMAL LOW (ref 3.7–5.3)
Sodium: 138 mEq/L (ref 137–147)
TOTAL PROTEIN: 8.2 g/dL (ref 6.0–8.3)
Total Bilirubin: 1.2 mg/dL (ref 0.3–1.2)

## 2013-11-12 LAB — URINALYSIS, ROUTINE W REFLEX MICROSCOPIC
Glucose, UA: NEGATIVE mg/dL
Hgb urine dipstick: NEGATIVE
Ketones, ur: 15 mg/dL — AB
Leukocytes, UA: NEGATIVE
Nitrite: NEGATIVE
Protein, ur: >300 mg/dL — AB
Specific Gravity, Urine: 1.03 (ref 1.005–1.030)
UROBILINOGEN UA: 1 mg/dL (ref 0.0–1.0)
pH: 5 (ref 5.0–8.0)

## 2013-11-12 LAB — CBC WITH DIFFERENTIAL/PLATELET
Basophils Absolute: 0 10*3/uL (ref 0.0–0.1)
Basophils Relative: 0 % (ref 0–1)
EOS ABS: 0 10*3/uL (ref 0.0–0.7)
EOS PCT: 0 % (ref 0–5)
HCT: 40.2 % (ref 39.0–52.0)
Hemoglobin: 14.3 g/dL (ref 13.0–17.0)
LYMPHS ABS: 1.8 10*3/uL (ref 0.7–4.0)
Lymphocytes Relative: 36 % (ref 12–46)
MCH: 32.9 pg (ref 26.0–34.0)
MCHC: 35.6 g/dL (ref 30.0–36.0)
MCV: 92.4 fL (ref 78.0–100.0)
MONO ABS: 0.4 10*3/uL (ref 0.1–1.0)
Monocytes Relative: 8 % (ref 3–12)
Neutro Abs: 2.8 10*3/uL (ref 1.7–7.7)
Neutrophils Relative %: 56 % (ref 43–77)
PLATELETS: 169 10*3/uL (ref 150–400)
RBC: 4.35 MIL/uL (ref 4.22–5.81)
RDW: 14 % (ref 11.5–15.5)
WBC: 5 10*3/uL (ref 4.0–10.5)

## 2013-11-12 LAB — URINE MICROSCOPIC-ADD ON

## 2013-11-12 LAB — CBG MONITORING, ED: Glucose-Capillary: 139 mg/dL — ABNORMAL HIGH (ref 70–99)

## 2013-11-12 LAB — TSH: TSH: 1.68 u[IU]/mL (ref 0.350–4.500)

## 2013-11-12 LAB — LIPASE, BLOOD: Lipase: 26 U/L (ref 11–59)

## 2013-11-12 IMAGING — CT CT HEAD W/O CM
2 series · 16 of 30 positions shown, 20 images · non-contrast
Comparison: None.

CLINICAL DATA: Loss of consciousness, denies pain to the head, neck
or back

EXAM:
CT HEAD WITHOUT CONTRAST
TECHNIQUE: Contiguous axial images were obtained from the base of the skull
through the vertex without intravenous contrast.

[Series 201: head w/o, idose (1) · axial · non-contrast · 0.49mm/px · z∈[+67,+187]mm · 13 of 30 slices shown, 17 images]
[im 3/30  brain]
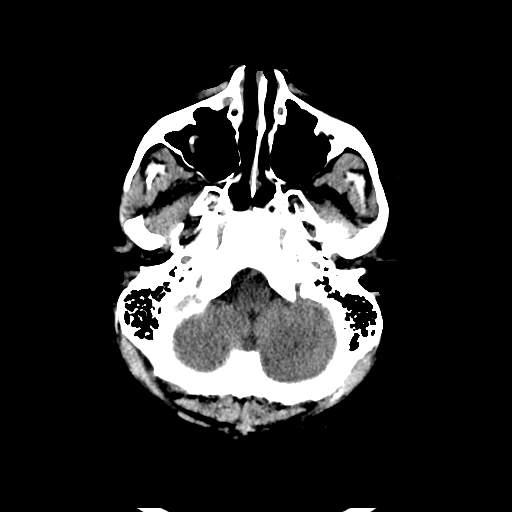
[im 3/30  bone]
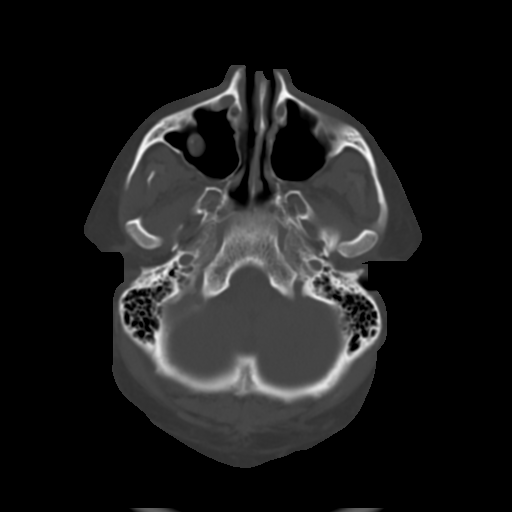
[im 5/30  brain]
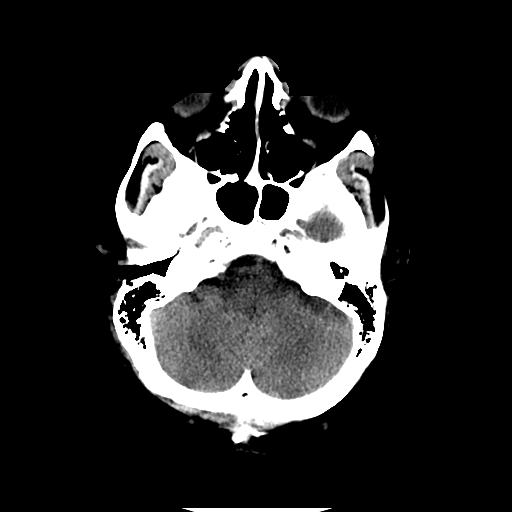
[im 7/30  brain]
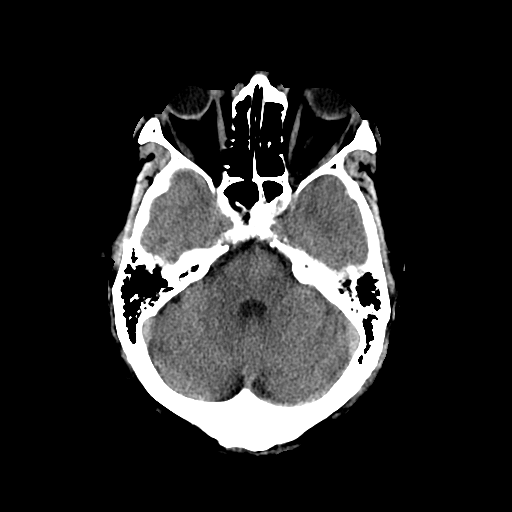
[im 9/30  brain]
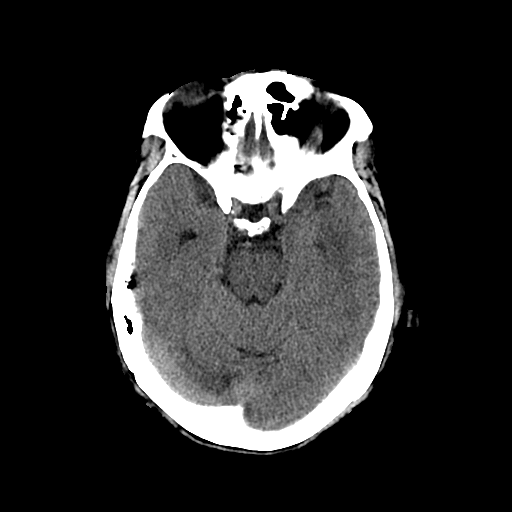
[im 11/30  brain]
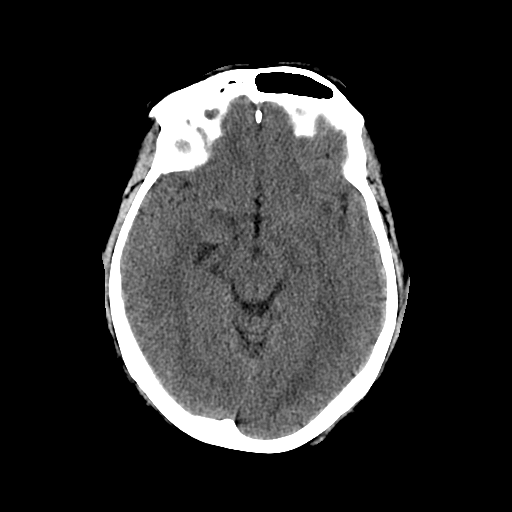
[im 11/30  bone]
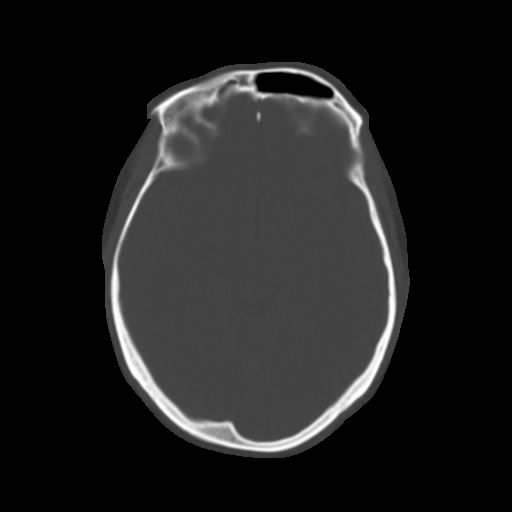
[im 13/30  brain]
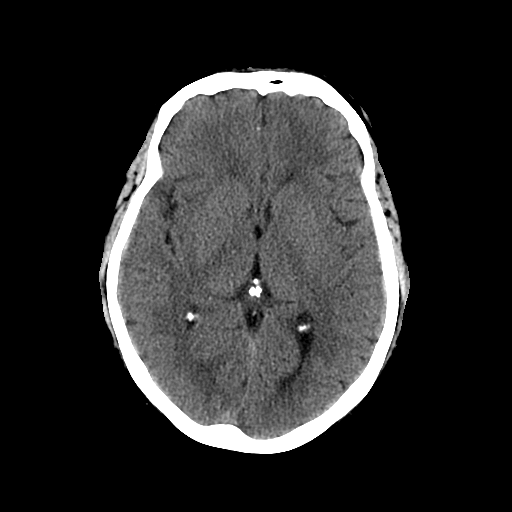
[im 15/30  brain]
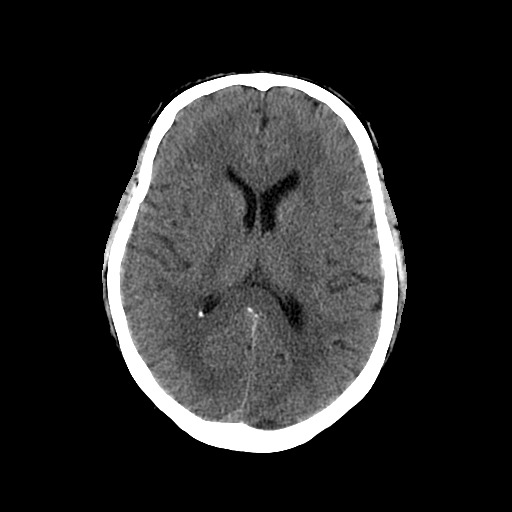
[im 17/30  brain]
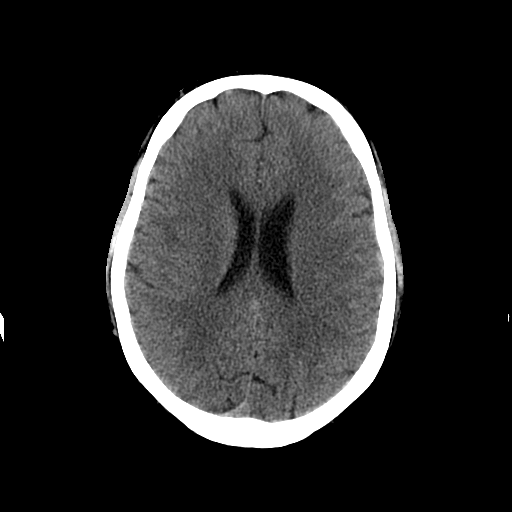
[im 19/30  brain]
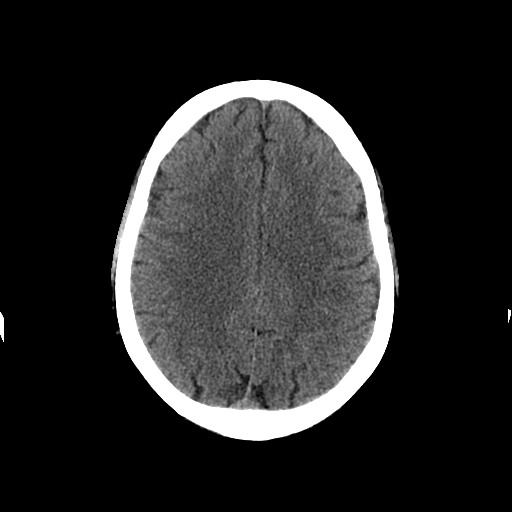
[im 19/30  bone]
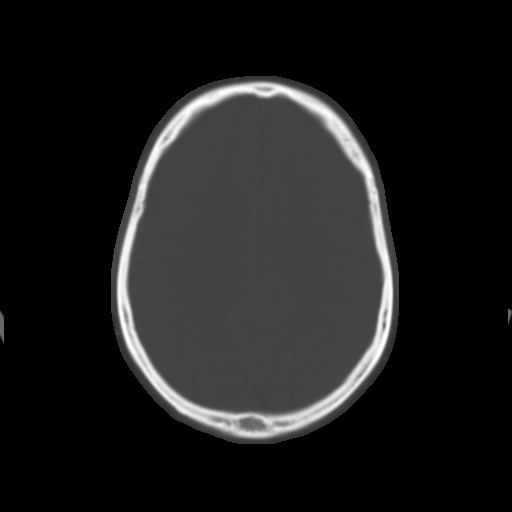
[im 21/30  brain]
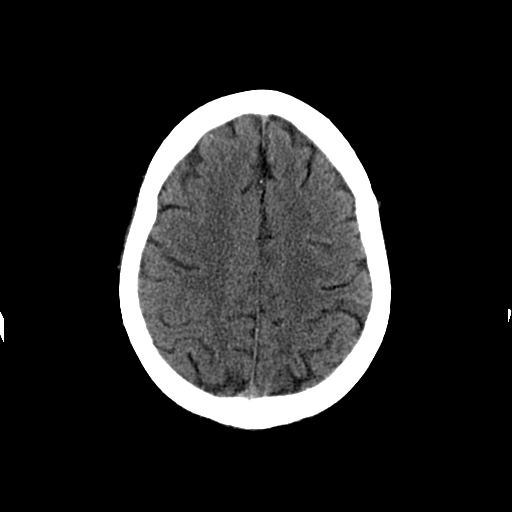
[im 23/30  brain]
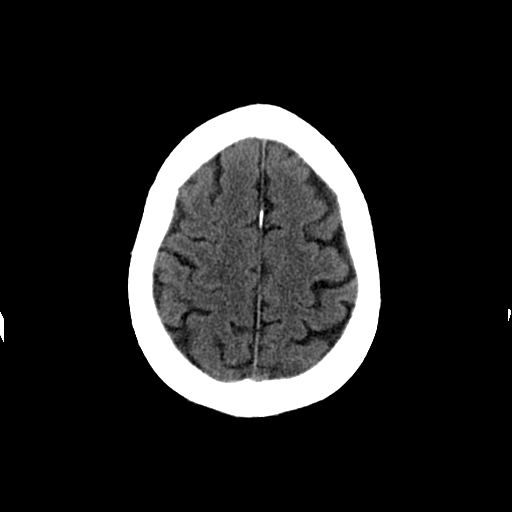
[im 25/30  brain]
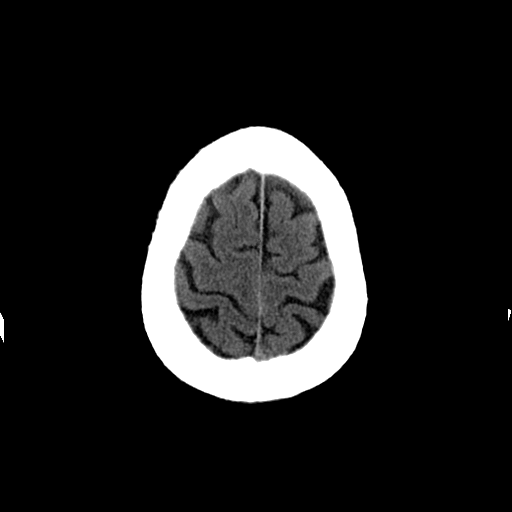
[im 27/30  brain]
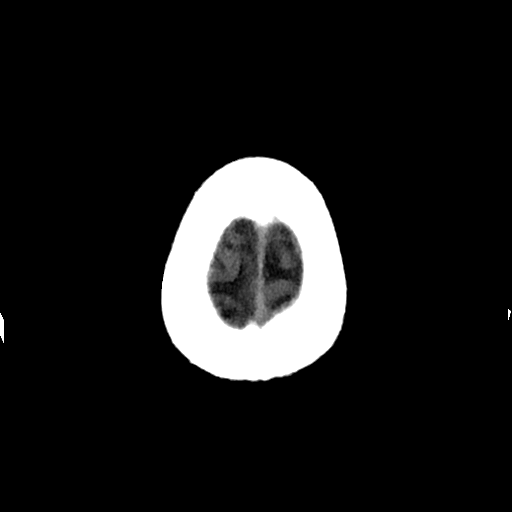
[im 27/30  bone]
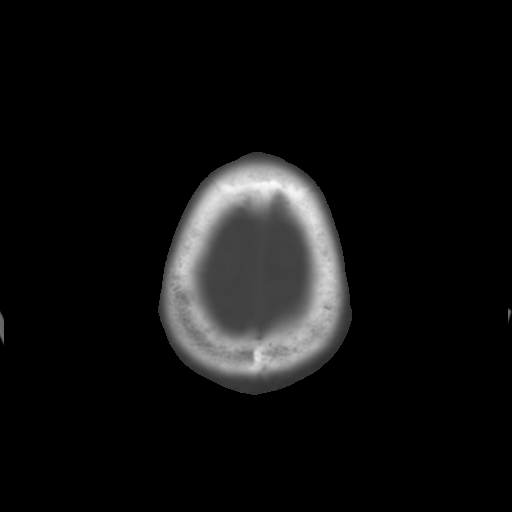

[Series 202: head w/o bone, idose (1) · axial · non-contrast · 0.49mm/px · z∈[+67,+107]mm · 3 of 30 slices shown]
[im 3/30  bone]
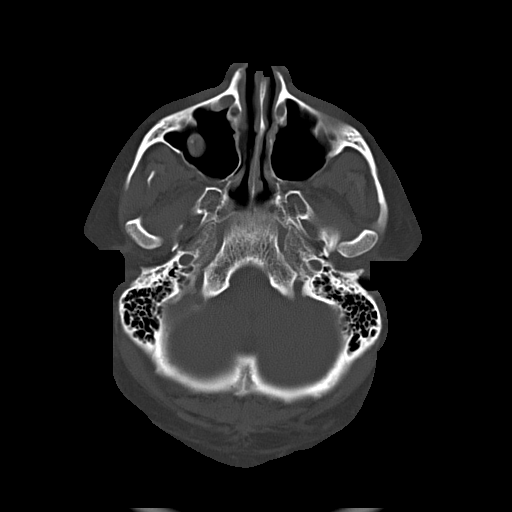
[im 7/30  bone]
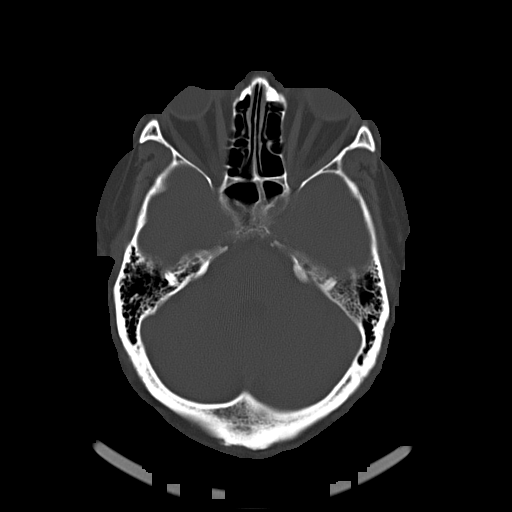
[im 11/30  bone]
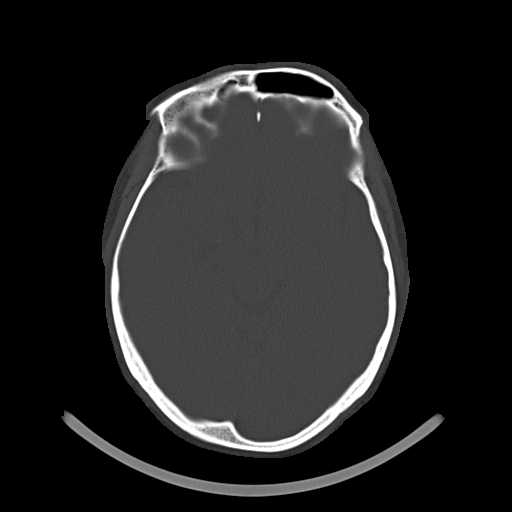

[16 of 30 positions shown; findings below may reference images not displayed]

FINDINGS: There is no evidence of mass effect, midline shift or extra-axial
fluid collections. There is no evidence of a space-occupying lesion
or intracranial hemorrhage. There is no evidence of a cortical-based
area of acute infarction.

The ventricles and sulci are appropriate for the patient's age. The
basal cisterns are patent.

Visualized portions of the orbits are unremarkable. The visualized
portions of the paranasal sinuses and mastoid air cells are
unremarkable.

The osseous structures are unremarkable.
IMPRESSION: No acute intracranial pathology.

## 2013-11-12 IMAGING — CR DG CHEST 2V
2 series · 2 of 2 positions shown · non-contrast
Comparison: None.

CLINICAL DATA: Cough, congestion and fever.

EXAM:
CHEST  2 VIEW

[w chest pa]
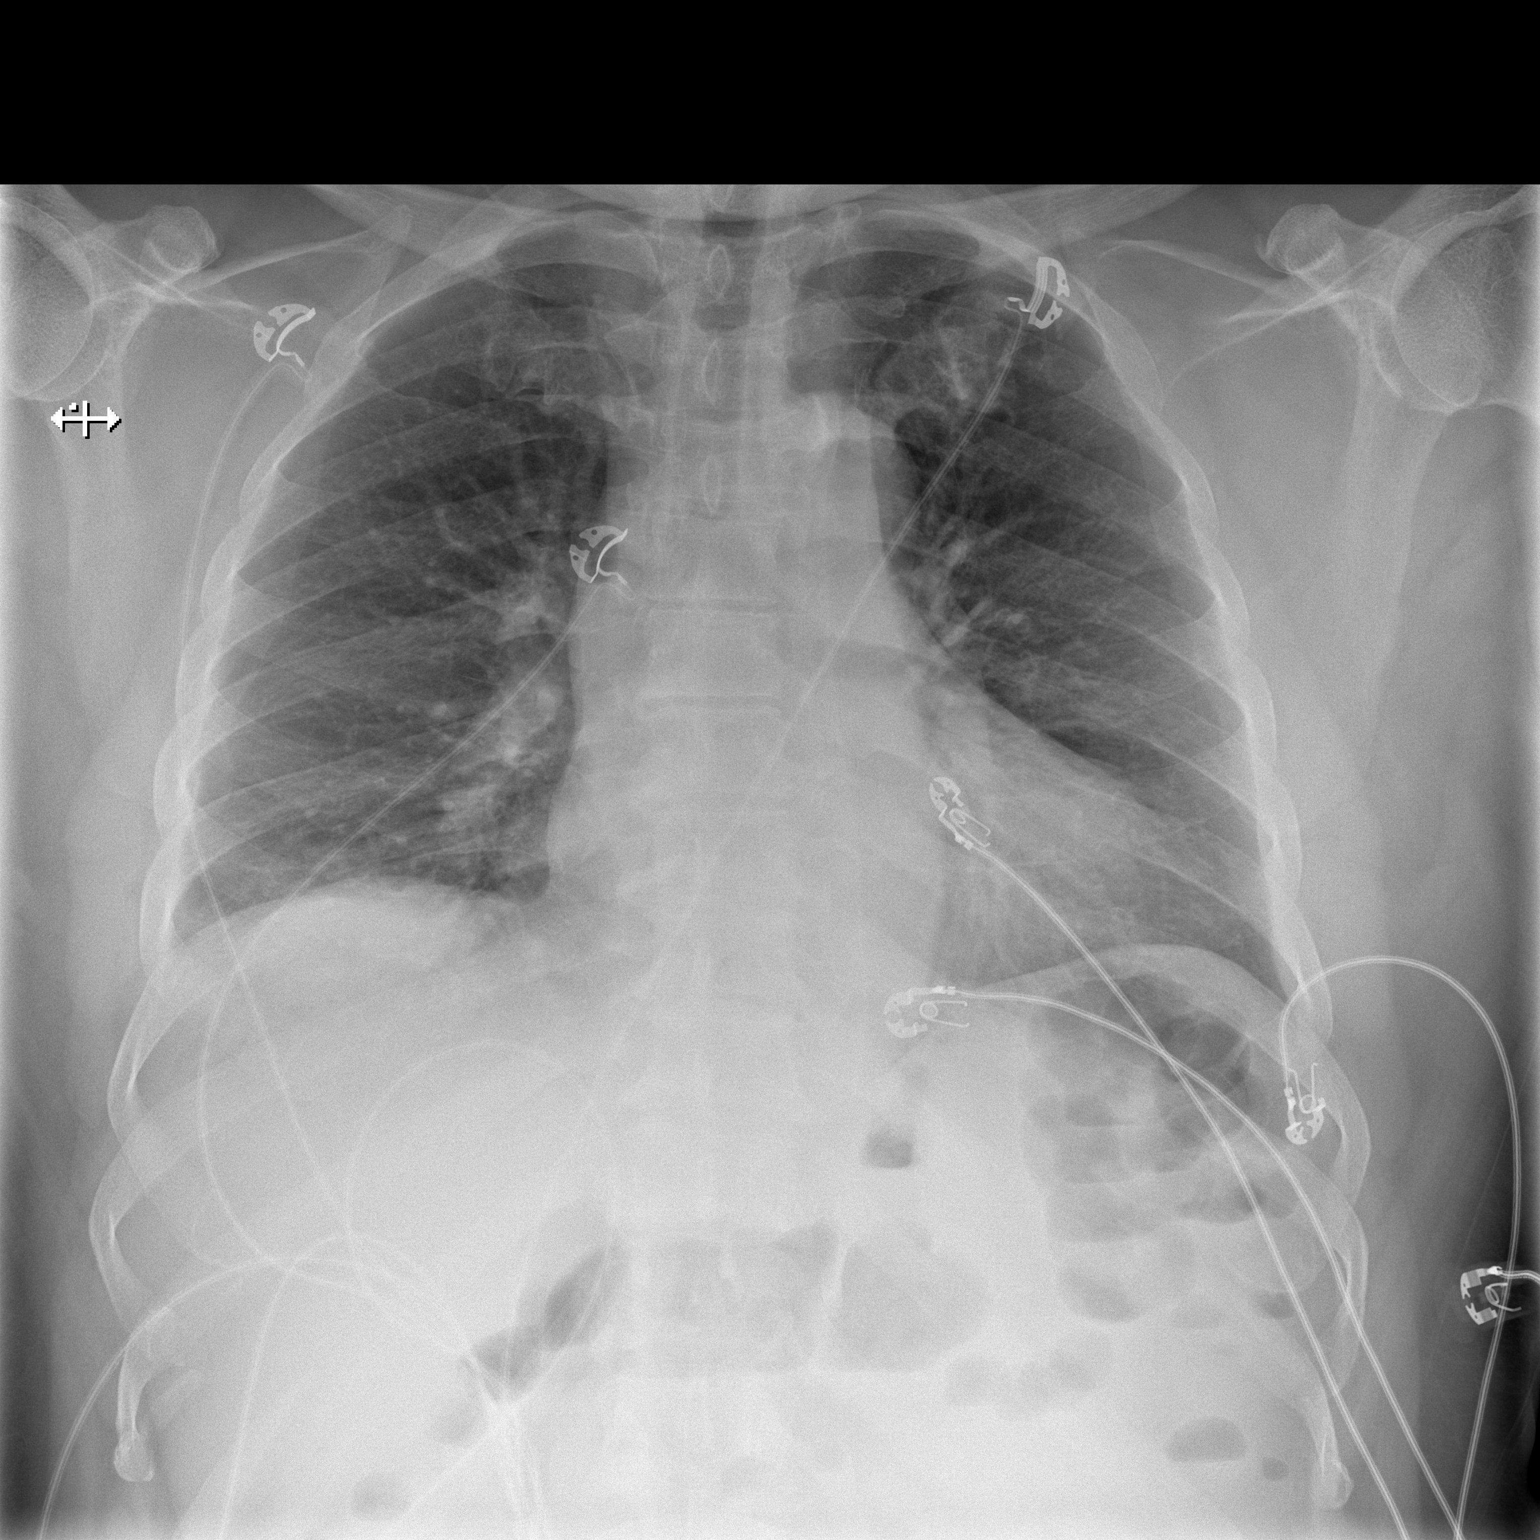

[w chest lat]
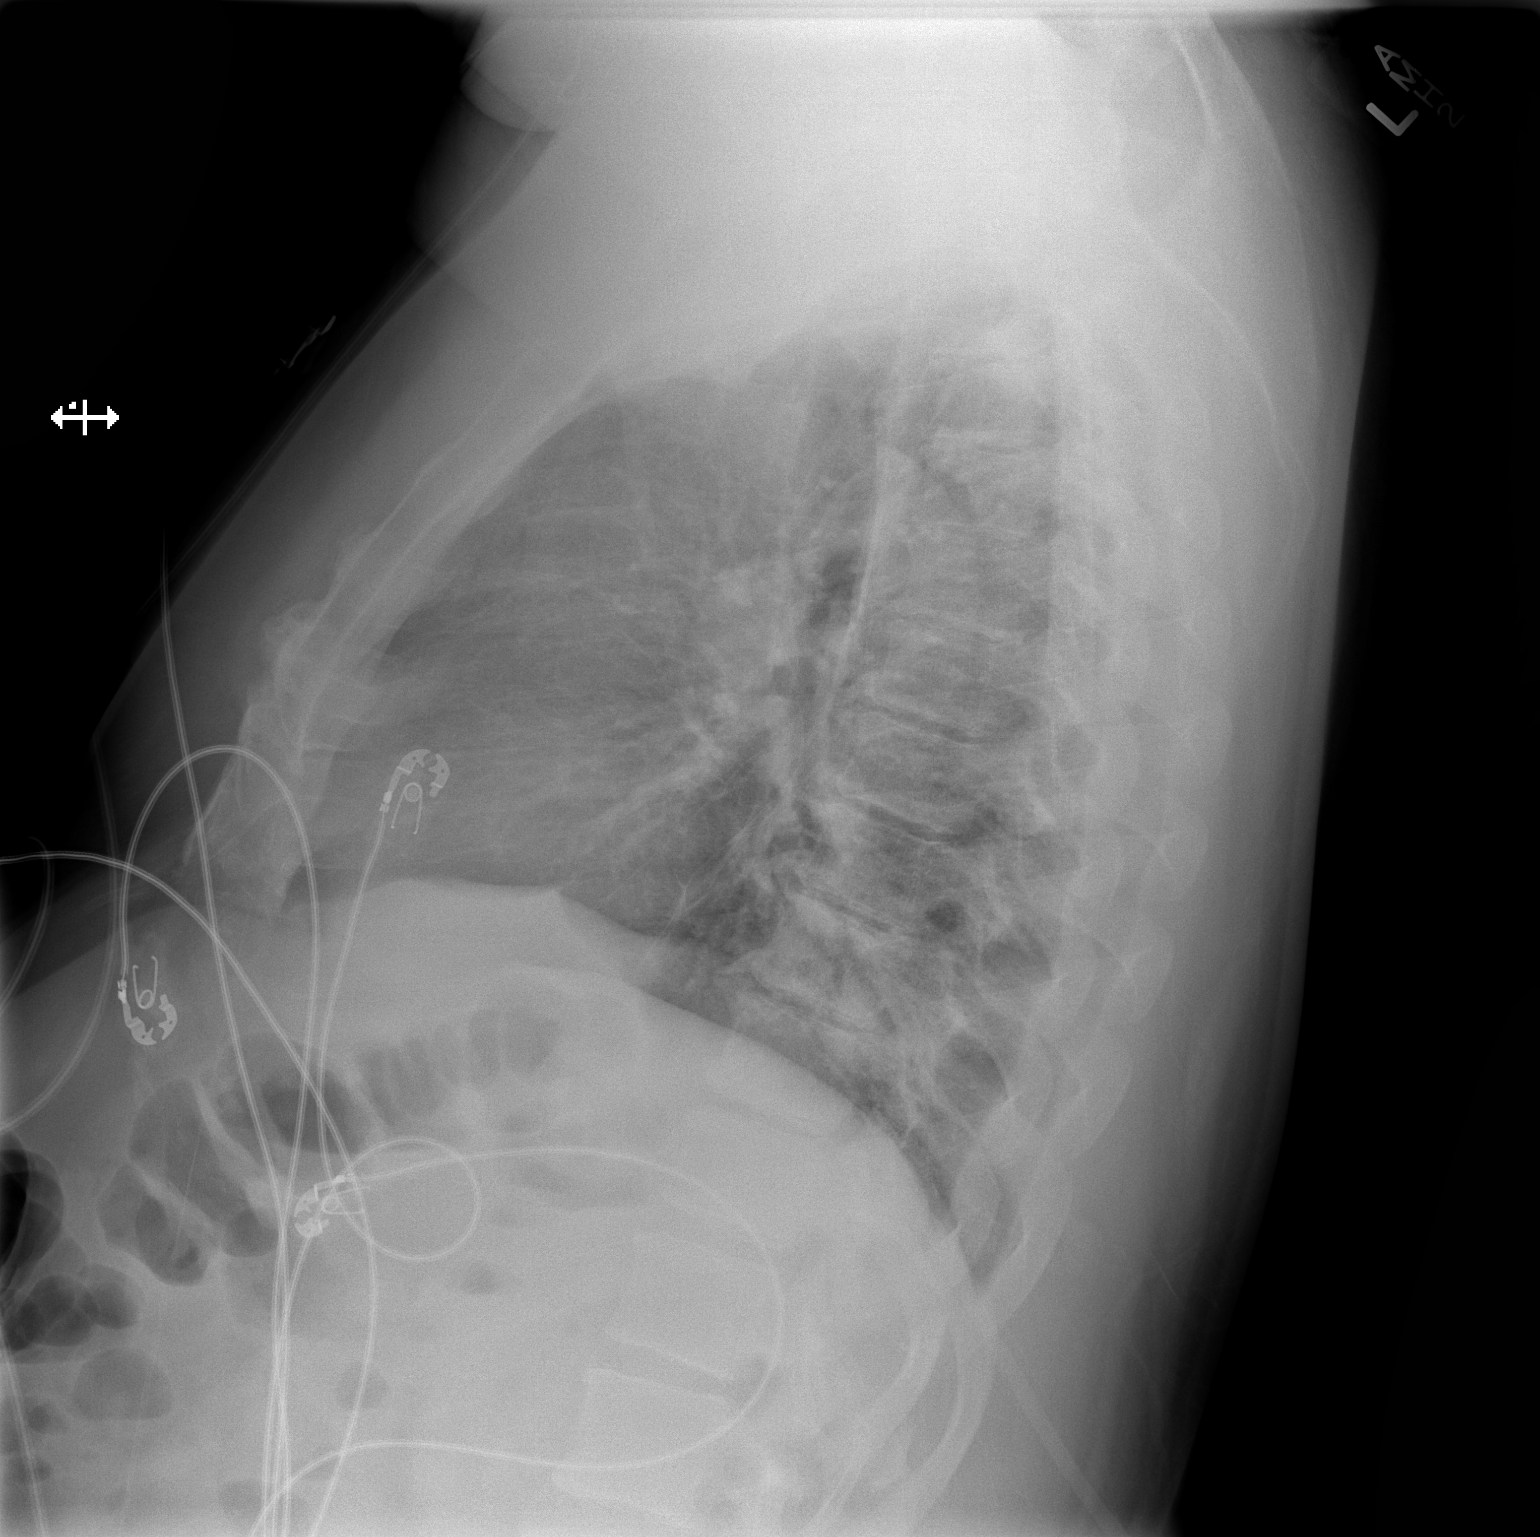

[2 of 2 positions shown; findings below may reference images not displayed]

FINDINGS: Right lower lobe airspace disease is compatible with pneumonia.

The cardiomediastinal silhouette is unremarkable.

There is no evidence of pulmonary edema, suspicious pulmonary
nodule/mass, pleural effusion, or pneumothorax. No acute bony
abnormalities are identified.
IMPRESSION: Right lower lobe pneumonia. Radiographic followup to resolution is
recommended.

## 2013-11-12 MED ORDER — AZITHROMYCIN 250 MG PO TABS
500.0000 mg | ORAL_TABLET | Freq: Once | ORAL | Status: AC
  Administered 2013-11-12: 500 mg via ORAL
  Filled 2013-11-12: qty 2

## 2013-11-12 MED ORDER — DILTIAZEM HCL ER 240 MG PO CP24
240.0000 mg | ORAL_CAPSULE | Freq: Every day | ORAL | Status: DC
  Administered 2013-11-12 – 2013-11-13 (×2): 240 mg via ORAL
  Filled 2013-11-12 (×3): qty 1

## 2013-11-12 MED ORDER — ACETAMINOPHEN 650 MG RE SUPP: 650.0000 mg | Freq: Four times a day (QID) | RECTAL | Status: DC | PRN

## 2013-11-12 MED ORDER — ATORVASTATIN CALCIUM 80 MG PO TABS
80.0000 mg | ORAL_TABLET | Freq: Every day | ORAL | Status: DC
  Administered 2013-11-12 – 2013-11-13 (×2): 80 mg via ORAL
  Filled 2013-11-12 (×3): qty 1

## 2013-11-12 MED ORDER — HEPARIN SODIUM (PORCINE) 5000 UNIT/ML IJ SOLN
5000.0000 [IU] | Freq: Three times a day (TID) | INTRAMUSCULAR | Status: DC
  Administered 2013-11-13 – 2013-11-14 (×4): 5000 [IU] via SUBCUTANEOUS
  Filled 2013-11-12 (×7): qty 1

## 2013-11-12 MED ORDER — VITAMIN C 500 MG PO TABS: 500.0000 mg | ORAL_TABLET | Freq: Every morning | ORAL | Status: DC

## 2013-11-12 MED ORDER — DEXTROSE 5 % IV SOLN
1.0000 g | Freq: Once | INTRAVENOUS | Status: AC
  Administered 2013-11-12: 1 g via INTRAVENOUS
  Filled 2013-11-12: qty 10

## 2013-11-12 MED ORDER — ACETAMINOPHEN 325 MG PO TABS: 650.0000 mg | ORAL_TABLET | Freq: Four times a day (QID) | ORAL | Status: DC | PRN

## 2013-11-12 MED ORDER — SODIUM CHLORIDE 0.9 % IJ SOLN
3.0000 mL | Freq: Two times a day (BID) | INTRAMUSCULAR | Status: DC
  Administered 2013-11-12: 3 mL via INTRAVENOUS

## 2013-11-12 MED ORDER — ASPIRIN EC 81 MG PO TBEC
81.0000 mg | DELAYED_RELEASE_TABLET | Freq: Every day | ORAL | Status: DC
  Administered 2013-11-13 – 2013-11-14 (×2): 81 mg via ORAL
  Filled 2013-11-12 (×2): qty 1

## 2013-11-12 MED ORDER — ALBUTEROL SULFATE (2.5 MG/3ML) 0.083% IN NEBU: 2.5000 mg | INHALATION_SOLUTION | RESPIRATORY_TRACT | Status: DC | PRN

## 2013-11-12 MED ORDER — ASPIRIN 81 MG PO TABS: 81.0000 mg | ORAL_TABLET | Freq: Every morning | ORAL | Status: DC

## 2013-11-12 MED ORDER — SODIUM CHLORIDE 0.9 % IV SOLN
INTRAVENOUS | Status: DC
  Administered 2013-11-12 – 2013-11-14 (×3): via INTRAVENOUS

## 2013-11-12 MED ORDER — AZITHROMYCIN 250 MG PO TABS
250.0000 mg | ORAL_TABLET | Freq: Every day | ORAL | Status: DC
  Administered 2013-11-13 – 2013-11-14 (×2): 250 mg via ORAL
  Filled 2013-11-12 (×2): qty 1

## 2013-11-12 MED ORDER — PANTOPRAZOLE SODIUM 40 MG PO TBEC
40.0000 mg | DELAYED_RELEASE_TABLET | Freq: Every day | ORAL | Status: DC
  Administered 2013-11-13 – 2013-11-14 (×2): 40 mg via ORAL
  Filled 2013-11-12 (×2): qty 1

## 2013-11-12 MED ORDER — VITAMIN C 500 MG PO TABS
500.0000 mg | ORAL_TABLET | Freq: Every day | ORAL | Status: DC
  Administered 2013-11-13 – 2013-11-14 (×2): 500 mg via ORAL
  Filled 2013-11-12 (×2): qty 1

## 2013-11-12 MED ORDER — POTASSIUM CHLORIDE 20 MEQ/15ML (10%) PO LIQD
40.0000 meq | Freq: Once | ORAL | Status: AC
  Administered 2013-11-13: 40 meq via ORAL
  Filled 2013-11-12: qty 30

## 2013-11-12 NOTE — ED Notes (Signed)
Lab results reported to Dr.McManus. 

## 2013-11-12 NOTE — ED Notes (Signed)
Pt was upstairs visiting family when he reports becoming diaphoretic. Pt does not remember wheat happened after that. Pt states that he was sitting in a chair. Was not reported if he fell out of chair. Pt denies and pain to head neck or back. Pt able to move all extremities. Pt was diaphoretic and arousable and oriented upon arrival to department. Pt was able to stand and sit on bed. Pt states that he has not ate much since Thursday. States that he has not had an appetite. Pt was incontinent upon arrival as well.

## 2013-11-12 NOTE — H&P (Signed)
[facility] Admission History and Physical Service Pager: [phone]  Patient name: Andrew Arnold Medical record number: [id number] Date of birth: Nov 17, 1955 Age: 58 y.o. Gender: male  Primary Care Provider: Robert Bellow, MD - Unassigned Consultants: none Code Status: DNR  Chief Complaint: syncope, general malaise  Assessment and Plan: Andrew Arnold is a 58 y.o. male presenting with syncope with urinary incontinence, found to have RLL PNA. PMH is significant for HTN, HLD  #Pulm: RLL PNA on CXR; allergic rhinitis; has sick contact with sister (admitted to hospital recently) and attesting to hx of cough and congestion in last several days; lungs otherwise largely clear to auscultation. CURB-65 score 2 (no confusion, BUN >19, RR <30, SBP >49 but diastolic <17) making moderate risk 6.8% 30 day mortality -s/p azithro, rocephin in the ED -will continue azithro 277m tmw, to complete 5 day course for CAP -trend fever curve -rehydration with IVF  #Syncopal episode assc with loss of urinary continence; ddx includes vasovagal vs cardiogenic vs seizure; CT head negative; Poor PO intake proceeding; Pt denies discussion of stressful events prior to event. No abnormal twitching or tongue biting noted.    -telemetry -monitor for close MS changes  -rehydration with IVF -cycle trops as below -A1C, TSH for risk strat  #CV: Hyperlipidemia/HTN; EKG changes concerning for cardiac etiology of syncope with uwaves and twave inversions, do not have prior EKG to compare to; does not appear volume overloaded on exam (actually volume contracted), trace edema bilaterally and lungs CTAB -on telemetry -repeat EKG tmw am -will cycle trops -ASA 81, ator 80 -will hold on ECHO for now -if concern for ACS will consider therapeutic heparin drip -cont on dilt given tachycardia, will hold enalapril, HCTZ in light of dehydration and AKI  #Renal: AKI, proteinuria >300; likely  component of decreased PO intake and excess GI losses (through stool); albumin 3.9 and total protein 8.2; denies recent hx of polydipsia/polyuria or polyphagia; acanthosis present and pt with central adiposity -hgb A1C  -repeat U/A tmw morning  -CK  -consider Protein/Cr ratio, FENa and Renal UKoreaif persistent -Ucx pending  #FEN/GI: hypokalemia; component of dehydration with elevated lactic acid 2.22, Cr 1.74 and hx of poor PO intake in last several days; reports UTD with colonoscopy (is due this year when turns 58) -rehydration with IVF, KCL -HHD -recheck BMET tmw morning  Prophylaxis: hsq  Disposition: admit to tele under Dr. FRee Kida History of Present Illness: Andrew BANIKis a 58y.o. male presenting with syncopal episode while visiting family member in the hospital. Pt reporting poor PO intake for the past several days. Has had both altered smell and taste for foods. Attesting to mostly drinking sweet teas and cokes; eating mainly only crackers. Additionally has had 2-3 days of loose stools. Also has noted general malaise and intermittent cough. Reporting minimal intake this morning prior to coming to visit sister in hospital. While sitting in chair at bedside noted himself to be profusely sweaty. Denies CP, SOB, palps, exertional dyspnea. Pt then woke up with several family members/friends looking over him. Has no memory in between. Noted urinary incontinence. No reported seizure like activity per family members.   In the ED pt with tmax 99.7, HR 104, BP 112/59. CT head obtained, negative. Lactic acid 2.22. CMET with mild transaminitis 50/88, alk phos 178. Cr 1.74, K 3.0. itrop neg. WBC 5.0, hgb 14.3. Urine with >300 protein and 15 ketones. Spec grav 1.030. CXR obtained suggestive of RLL PNA.  Of note sister diagnosed with PNA while in the hospital. Pt started on azithro/rocephin.  Has been taking all meds including HCTZ and enalapril.  Unclear as to why on Dilt but hx of ?arrhythmia.   No known A.Fib or prior CAD/MI.  Review Of Systems: Per HPI with the following additions: none Otherwise 12 point review of systems was performed and was unremarkable.  Patient Active Problem List   Diagnosis Date Noted  . CAP (community acquired pneumonia) 11/12/2013  . HYPERLIPIDEMIA 08/14/2008  . HYPERTENSION 08/14/2008  . RHINITIS, ALLERGIC, DUE TO POLLEN 04/23/2007   Past Medical History: Past Medical History  Diagnosis Date  . Allergic rhinitis due to pollen     on allergy shots  . Hyperlipidemia   . HTN (hypertension)   . Arrhythmia     1980s hospitalized for HTN and ?irregular rhythm but no specific dx but resolved during hospitalization   Past Surgical History: Past Surgical History  Procedure Laterality Date  . Colonoscopy      @ age 36; reported as normal; recommended to repeat at age 50   Social History: History  Substance Use Topics  . Smoking status: Never Smoker   . Smokeless tobacco: Not on file  . Alcohol Use: No   Additional social history:  Please also refer to relevant sections of EMR.  Family History: Family History  Problem Relation Age of Onset  . Cancer Mother   . Cirrhosis Father     cirrhosis of the liver  . Other Other     neice was on allergy vaccine when young   Allergies and Medications: No Known Allergies No current facility-administered medications on file prior to encounter.   Current Outpatient Prescriptions on File Prior to Encounter  Medication Sig Dispense Refill  . ascorbic acid (VITAMIN C) 500 MG tablet Take 500 mg by mouth every morning.       Marland Kitchen aspirin 81 MG tablet Take 81 mg by mouth every morning.       Marland Kitchen atorvastatin (LIPITOR) 80 MG tablet Take 80 mg by mouth at bedtime.       . B Complex Vitamins (B COMPLEX 100 PO) Take 1 tablet by mouth every morning.       . diltiazem (DILACOR XR) 240 MG 24 hr capsule Take 240 mg by mouth at bedtime.       . enalapril (VASOTEC) 20 MG tablet Take 20 mg by mouth at bedtime.        . hydrochlorothiazide (HYDRODIURIL) 50 MG tablet Take 12.5 mg by mouth at bedtime. 1/4 tab by mouth once daily        Objective: BP 152/68  Pulse 93  Temp(Src) 99.2 F (37.3 C) (Oral)  Resp 18  Ht '5\' 9"'  (1.753 m)  Wt 261 lb 12.8 oz (118.752 kg)  BMI 38.64 kg/m2  SpO2 96% Exam: General: awake, alert, lying in bed HEENT: NCAT, PERRL, EOMI, supple thick neck Cardiovascular: RRR, nml S1/2, no murmurs, carotid bruits difficult to assess 2/2 patient habitus Respiratory: CTAB apart from faint crackle on RLL, nml WOB on RA Abdomen: obese, soft, NTND, normoactive  Extremities: WWP, trace edema bilaterally Skin: no rashes or lesions Neuro: A&OX3, no focal deficits  Labs and Imaging: CBC BMET   Recent Labs Lab 11/12/13 1640  WBC 5.0  HGB 14.3  HCT 40.2  PLT 169    Recent Labs Lab 11/12/13 1640  NA 138  K 3.0*  CL 96  CO2 22  BUN 25*  CREATININE 1.74*  GLUCOSE 139*  CALCIUM 9.3     IMPRESSION:  Right lower lobe pneumonia. Radiographic followup to resolution is  recommended.  EKG- sinus with uwaves in II, III, avf, and V5 with twave inversion  Andrew Masker, MD 11/13/2013 PGY-1, [place] Intern pager: [phone], text pages welcome  R2 Addendum:  I have seen the above patient and have discussed the patient's presentation, history, objective data, physical exam and assessment and plan with Dr. Skeet Simmer.  Briefly, this patient is a 59 y.o. year old male who experienced a syncopal event while visiting his sister.  Prodromal illness likely reflecting underlying PNA dx in ED.  Given unclear history of need for diltiazem will cycle CEs and monitor on telemetry.  Likely neurocardiogenic given prodromal flushing but must consider arrhythmia as well.  Will risk stratify as well with TSH and A1c.  Needs close monitoring.  Of note Code Status discussion pt reports he does not wish for Heroic measures and this is consistent with his mother and aunt who both  shared this view.  This should likely be further discussed but pt was clear in his decision.  Gerda Diss, DO [facility] Resident - PGY-3 11/13/2013 12:15 AM

## 2013-11-12 NOTE — ED Provider Notes (Signed)
CSN: [id number]     Arrival date & time 11/12/13  1615 History   First MD Initiated Contact with Patient 11/12/13 1634     Chief Complaint  Patient presents with  . Loss of Consciousness      HPI Pt was seen at 1630. Per witness report and pt, c/o sudden onset and resolution of one episode of brief syncope that occurred PTA. Pt was sitting in a chair visiting a friend in the hospital when he "felt lightheaded and sweaty" and "then I woke up with everyone around me." Witnesses reported syncopal episode with urinary incont. Pt awoke A&O. No reported seizure activity, no apnea, no pulselessness. Pt states for the past 3 to 4 days he has had intermittent loose stools, cough, decreased PO intake, as well as generalized weakness/fatigue. Denies CP/palpitations, no SOB, no abd pain, no N/V, no fevers, no back pain, no black or blood in stools.    Past Medical History  Diagnosis Date  . Allergic rhinitis due to pollen     on allergy shots  . Hyperlipidemia   . HTN (hypertension)    History reviewed. No pertinent past surgical history.  Family History  Problem Relation Age of Onset  . Cancer Mother   . Cirrhosis Father     cirrhosis of the liver  . Other Other     neice was on allergy vaccine when young   History  Substance Use Topics  . Smoking status: Never Smoker   . Smokeless tobacco: Not on file  . Alcohol Use: No    Review of Systems ROS: Statement: All systems negative except as marked or noted in the HPI; Constitutional: Negative for fever and chills. +generalized weakness/fatigue.; ; Eyes: Negative for eye pain, redness and discharge. ; ; ENMT: Negative for ear pain, hoarseness, nasal congestion, sinus pressure and sore throat. ; ; Cardiovascular: Negative for chest pain, palpitations, dyspnea and peripheral edema. ; ; Respiratory: +cough. Negative for wheezing and stridor. ; ; Gastrointestinal: +diarrhea. Negative for nausea, vomiting, abdominal pain, blood in stool,  hematemesis, jaundice and rectal bleeding. ; ; Genitourinary: Negative for dysuria, flank pain and hematuria. ; ; Musculoskeletal: Negative for back pain and neck pain. Negative for swelling and trauma.; ; Skin: +diaphoresis. Negative for pruritus, rash, abrasions, blisters, bruising and skin lesion.; ; Neuro: +lightheadedness.Negative for headache and neck stiffness. Negative for extremity weakness, paresthesias, involuntary movement, seizure and +syncope.      Allergies  Review of patient's allergies indicates no known allergies.  Home Medications   Prior to Admission medications   Medication Sig Start Date End Date Taking? Authorizing Provider  ascorbic acid (VITAMIN C) 500 MG tablet Take 500 mg by mouth every morning.    Yes Historical Provider, MD  aspirin 81 MG tablet Take 81 mg by mouth every morning.    Yes Historical Provider, MD  atorvastatin (LIPITOR) 80 MG tablet Take 80 mg by mouth at bedtime.    Yes Historical Provider, MD  B Complex Vitamins (B COMPLEX 100 PO) Take 1 tablet by mouth every morning.    Yes Historical Provider, MD  diltiazem (DILACOR XR) 240 MG 24 hr capsule Take 240 mg by mouth at bedtime.    Yes Historical Provider, MD  enalapril (VASOTEC) 20 MG tablet Take 20 mg by mouth at bedtime.    Yes Historical Provider, MD  esomeprazole (NEXIUM) 20 MG capsule Take 20 mg by mouth daily as needed (for heart burn).   Yes Historical Provider, MD  hydrochlorothiazide (HYDRODIURIL)  50 MG tablet Take 12.5 mg by mouth at bedtime. 1/4 tab by mouth once daily   Yes Historical Provider, MD   BP 112/59  Pulse 104  Temp(Src) 99.7 F (37.6 C) (Oral)  Resp 23  Ht 5\' 9"  (1.753 m)  Wt 270 lb (122.471 kg)  BMI 39.85 kg/m2  SpO2 95% Physical Exam 1635: Physical examination:  Nursing notes reviewed; Vital signs and O2 SAT reviewed;  Constitutional: Well developed, Well nourished, In no acute distress; Head:  Normocephalic, atraumatic; Eyes: EOMI, PERRL, No scleral icterus; ENMT: TM's  clear bilat. +edemetous nasal turbinates bilat with clear rhinorrhea. Mouth and pharynx normal, Mucous membranes dry; Neck: Supple, Full range of motion, No lymphadenopathy; Cardiovascular: Regular rate and rhythm, No gallop; Respiratory: Breath sounds coarse & equal bilaterally, No wheezes. Speaking full sentences with ease, Normal respiratory effort/excursion; Chest: Nontender, Movement normal; Abdomen: Soft, Nontender, Nondistended, Normal bowel sounds; Genitourinary: No CVA tenderness; Extremities: Pulses normal, No tenderness, No edema, No calf edema or asymmetry.; Neuro: AA&Ox3, Major CN grossly intact.Speech clear.  No facial droop.  No nystagmus. Grips equal. Strength 5/5 equal bilat UE's and LE's.  DTR 2/4 equal bilat UE's and LE's.  No gross sensory deficits.  Normal cerebellar testing bilat UE's (finger-nose) and LE's (heel-shin).; Skin: Color normal, Warm, Dry.   ED Course  Procedures     EKG Interpretation   Date/Time:  Sunday Nov 12 2013 16:20:27 EDT Ventricular Rate:  97 PR Interval:  79 QRS Duration: 97 QT Interval:  361 QTC Calculation: 459 R Axis:   48 Text Interpretation:  Sinus rhythm Short PR interval Baseline wander  Nonspecific T wave abnormality No old tracing to compare Confirmed by  [facility]  MD, Nunzio Cory [phone]) on 11/12/2013 4:45:20 PM      MDM  MDM Reviewed: nursing note and vitals Interpretation: labs, ECG, x-ray and CT scan    Results for orders placed during the hospital encounter of 11/12/13  CBC WITH DIFFERENTIAL      Result Value Ref Range   WBC 5.0  4.0 - 10.5 K/uL   RBC 4.35  4.22 - 5.81 MIL/uL   Hemoglobin 14.3  13.0 - 17.0 g/dL   HCT 40.2  39.0 - 52.0 %   MCV 92.4  78.0 - 100.0 fL   MCH 32.9  26.0 - 34.0 pg   MCHC 35.6  30.0 - 36.0 g/dL   RDW 14.0  11.5 - 15.5 %   Platelets 169  150 - 400 K/uL   Neutrophils Relative % 56  43 - 77 %   Neutro Abs 2.8  1.7 - 7.7 K/uL   Lymphocytes Relative 36  12 - 46 %   Lymphs Abs 1.8  0.7 - 4.0 K/uL    Monocytes Relative 8  3 - 12 %   Monocytes Absolute 0.4  0.1 - 1.0 K/uL   Eosinophils Relative 0  0 - 5 %   Eosinophils Absolute 0.0  0.0 - 0.7 K/uL   Basophils Relative 0  0 - 1 %   Basophils Absolute 0.0  0.0 - 0.1 K/uL  COMPREHENSIVE METABOLIC PANEL      Result Value Ref Range   Sodium 138  137 - 147 mEq/L   Potassium 3.0 (*) 3.7 - 5.3 mEq/L   Chloride 96  96 - 112 mEq/L   CO2 22  19 - 32 mEq/L   Glucose, Bld 139 (*) 70 - 99 mg/dL   BUN 25 (*) 6 - 23 mg/dL   Creatinine, Ser  1.74 (*) 0.50 - 1.35 mg/dL   Calcium 9.3  8.4 - 10.5 mg/dL   Total Protein 8.2  6.0 - 8.3 g/dL   Albumin 3.9  3.5 - 5.2 g/dL   AST 50 (*) 0 - 37 U/L   ALT 88 (*) 0 - 53 U/L   Alkaline Phosphatase 178 (*) 39 - 117 U/L   Total Bilirubin 1.2  0.3 - 1.2 mg/dL   GFR calc non Af Amer 41 (*) >90 mL/min   GFR calc Af Amer 48 (*) >90 mL/min  LIPASE, BLOOD      Result Value Ref Range   Lipase 26  11 - 59 U/L  TROPONIN I      Result Value Ref Range   Troponin I <0.30  <0.30 ng/mL  CBG MONITORING, ED      Result Value Ref Range   Glucose-Capillary 139 (*) 70 - 99 mg/dL  I-STAT CG4 LACTIC ACID, ED      Result Value Ref Range   Lactic Acid, Venous 2.22 (*) 0.5 - 2.2 mmol/L   Dg Chest 2 View 11/12/2013   CLINICAL DATA:  Cough, congestion and fever.  EXAM: CHEST  2 VIEW  COMPARISON:  None.  FINDINGS: Right lower lobe airspace disease is compatible with pneumonia.  The cardiomediastinal silhouette is unremarkable.  There is no evidence of pulmonary edema, suspicious pulmonary nodule/mass, pleural effusion, or pneumothorax. No acute bony abnormalities are identified.  IMPRESSION: Right lower lobe pneumonia. Radiographic followup to resolution is recommended.   Electronically Signed   By: Hassan Rowan M.D.   On: 11/12/2013 18:01   Ct Head Wo Contrast 11/12/2013   CLINICAL DATA:  Loss of consciousness, denies pain to the head, neck or back  EXAM: CT HEAD WITHOUT CONTRAST  TECHNIQUE: Contiguous axial images were obtained from the  base of the skull through the vertex without intravenous contrast.  COMPARISON:  None.  FINDINGS: There is no evidence of mass effect, midline shift or extra-axial fluid collections. There is no evidence of a space-occupying lesion or intracranial hemorrhage. There is no evidence of a cortical-based area of acute infarction.  The ventricles and sulci are appropriate for the patient's age. The basal cisterns are patent.  Visualized portions of the orbits are unremarkable. The visualized portions of the paranasal sinuses and mastoid air cells are unremarkable.  The osseous structures are unremarkable.  IMPRESSION: No acute intracranial pathology.   Electronically Signed   By: Kathreen Devoid   On: 11/12/2013 18:05    1900:  CAP on CXR; will dose IV abx. BUN/Cr elevated, no old to compare; will dose judicious IVF. Potassium repleted PO. Dx and testing d/w pt and family.  Questions answered.  Verb understanding, agreeable to admit. T/C to [facility] Resident, case discussed, including:  HPI, pertinent PM/SHx, VS/PE, dx testing, ED course and treatment:  Agreeable to admit, requests to write temporary orders, obtain observation tele bed to Dr. Nedra Hai service.    Alfonzo Feller, DO 11/14/13 [phone]

## 2013-11-12 NOTE — ED Notes (Signed)
MD at bedside. Admitting MD.

## 2013-11-12 NOTE — ED Notes (Signed)
MD at bedside. 

## 2013-11-13 DIAGNOSIS — N289 Disorder of kidney and ureter, unspecified: Secondary | ICD-10-CM

## 2013-11-13 DIAGNOSIS — I517 Cardiomegaly: Secondary | ICD-10-CM

## 2013-11-13 DIAGNOSIS — I10 Essential (primary) hypertension: Secondary | ICD-10-CM

## 2013-11-13 LAB — URINALYSIS, ROUTINE W REFLEX MICROSCOPIC
BILIRUBIN URINE: NEGATIVE
Glucose, UA: NEGATIVE mg/dL
Ketones, ur: NEGATIVE mg/dL
Leukocytes, UA: NEGATIVE
NITRITE: NEGATIVE
Protein, ur: 30 mg/dL — AB
SPECIFIC GRAVITY, URINE: 1.027 (ref 1.005–1.030)
Urobilinogen, UA: 1 mg/dL (ref 0.0–1.0)
pH: 5 (ref 5.0–8.0)

## 2013-11-13 LAB — TROPONIN I: Troponin I: <0.3 ng/mL (ref <0.30)

## 2013-11-13 LAB — CREATININE, SERUM
Creatinine, Ser: 1.51 mg/dL — ABNORMAL HIGH (ref 0.50–1.35)
GFR calc Af Amer: 57 mL/min — ABNORMAL LOW (ref >90)
GFR calc non Af Amer: 49 mL/min — ABNORMAL LOW (ref >90)

## 2013-11-13 LAB — COMPREHENSIVE METABOLIC PANEL
ALK PHOS: 147 U/L — AB (ref 39–117)
ALT: 69 U/L — AB (ref 0–53)
AST: 43 U/L — ABNORMAL HIGH (ref 0–37)
Albumin: 3.3 g/dL — ABNORMAL LOW (ref 3.5–5.2)
BUN: 24 mg/dL — ABNORMAL HIGH (ref 6–23)
CO2: 24 meq/L (ref 19–32)
Calcium: 8.4 mg/dL (ref 8.4–10.5)
Chloride: 98 mEq/L (ref 96–112)
Creatinine, Ser: 1.38 mg/dL — ABNORMAL HIGH (ref 0.50–1.35)
GFR, EST AFRICAN AMERICAN: 63 mL/min — AB (ref >90)
GFR, EST NON AFRICAN AMERICAN: 54 mL/min — AB (ref >90)
GLUCOSE: 120 mg/dL — AB (ref 70–99)
POTASSIUM: 3.5 meq/L — AB (ref 3.7–5.3)
SODIUM: 136 meq/L — AB (ref 137–147)
Total Bilirubin: 0.9 mg/dL (ref 0.3–1.2)
Total Protein: 7.2 g/dL (ref 6.0–8.3)

## 2013-11-13 LAB — CBC
HCT: 35.7 % — ABNORMAL LOW (ref 39.0–52.0)
Hemoglobin: 12.3 g/dL — ABNORMAL LOW (ref 13.0–17.0)
MCH: 32 pg (ref 26.0–34.0)
MCHC: 34.5 g/dL (ref 30.0–36.0)
MCV: 93 fL (ref 78.0–100.0)
Platelets: 140 10*3/uL — ABNORMAL LOW (ref 150–400)
RBC: 3.84 MIL/uL — ABNORMAL LOW (ref 4.22–5.81)
RDW: 14.4 % (ref 11.5–15.5)
WBC: 3.2 10*3/uL — ABNORMAL LOW (ref 4.0–10.5)

## 2013-11-13 LAB — URINE MICROSCOPIC-ADD ON

## 2013-11-13 LAB — URINE CULTURE
Colony Count: NO GROWTH
Culture: NO GROWTH

## 2013-11-13 LAB — HEMOGLOBIN A1C
Hgb A1c MFr Bld: 6.3 % — ABNORMAL HIGH (ref <5.7)
Mean Plasma Glucose: 134 mg/dL — ABNORMAL HIGH (ref <117)

## 2013-11-13 LAB — CK: Total CK: 646 U/L — ABNORMAL HIGH (ref 7–232)

## 2013-11-13 MED ORDER — POTASSIUM CHLORIDE 20 MEQ/15ML (10%) PO LIQD
40.0000 meq | Freq: Once | ORAL | Status: AC
  Administered 2013-11-13: 40 meq via ORAL
  Filled 2013-11-13: qty 30

## 2013-11-13 MED ORDER — DEXTROSE 5 % IV SOLN
1.0000 g | Freq: Once | INTRAVENOUS | Status: AC
  Administered 2013-11-13: 1 g via INTRAVENOUS
  Filled 2013-11-13: qty 10

## 2013-11-13 NOTE — Progress Notes (Signed)
UR Completed.  Vergie Living [id number] 11/13/2013

## 2013-11-13 NOTE — Progress Notes (Signed)
Family Medicine Teaching Service Daily Progress Note Intern Pager: [phone]  Patient name: Andrew Arnold Medical record number: [id number] Date of birth: [birth year] Age: 58 y.o. Gender: male  Primary Care Provider: Robert Bellow, MD Consultants: none Code Status: DNR  Pt Overview and Major Events to Date:  5/3 pt admitted for syncope and RLL PNA 5/4 rehydration, repeat EKG, Ucx pending  Assessment and Plan: Andrew Arnold is a 58 y.o. male presenting with syncope with urinary incontinence, found to have RLL PNA. PMH is significant for HTN, HLD   #Pulm: RLL PNA on CXR; allergic rhinitis; has sick contact with sister (admitted to hospital recently) and attesting to hx of cough and congestion in last several days; lungs otherwise largely clear to auscultation. CURB-65 score 2 (no confusion, BUN >19, RR <30, SBP >76 but diastolic <73) making moderate risk 6.8% 30 day mortality  -s/p azithro, rocephin in the ED  -azithro 250mg , to complete 5 day course for CAP  -trend fever curve  -rehydration with IVF   #Syncopal episode assc with loss of urinary continence; ddx includes vasovagal vs cardiogenic vs seizure; CT head negative; Poor PO intake proceeding; Pt denies discussion of stressful events prior to event. No abnormal twitching or tongue biting noted. TSH 1.68  -telemetry  -monitor for close MS changes  -rehydration with IVF  -A1C for risk strat   #CV: Hyperlipidemia/HTN; EKG changes concerning for cardiac etiology of syncope with uwaves and twave inversions, do not have prior EKG to compare to; does not appear volume overloaded on exam (actually volume contracted), trace edema bilaterally and lungs CTAB; trops neg X3 -on telemetry  -repeat EKG this morning  -ASA 81, ator 80  -ECHO today given PVCs for valvular dx -cont on dilt given tachycardia (pt unclear of why he is on this medication), will hold enalapril, HCTZ in light of dehydration and AKI   #Renal: AKI,  proteinuria >300; likely component of decreased PO intake and excess GI losses (through stool) and assc ATN; albumin 3.9 and total protein 8.2; denies recent hx of polydipsia/polyuria or polyphagia; acanthosis present and pt with central adiposity. CK elevated to 646. Cr improved 1.74->1.38, repeat U/A with 30 protein, negative ketones but large hgb and RBCs TNTC and granular casts, accompaniment of URI predisposse to glomerular nephritis or IGA -hgb A1C   -consider Protein/Cr ratio, FENa and Renal US if persistent  -Ucx pending, will cont rocephin for coverage until neg -repeat U/A in outpt setting  #FEN/GI: hypokalemia; component of dehydration with elevated lactic acid 2.22, initial Cr of 1.74 and hx of poor PO intake in last several days; reports UTD with colonoscopy (is due this year when turns 58)  -rehydration with IVF, KCL  -HHD   Prophylaxis: hsq  Disposition: rehydration, improvement in vitals  Subjective: No further syncopal episodes, still having difficulty taking good amount of PO; denies CP, palps SOB  Objective: Temp:  [98.6 F (37 C)-99.7 F (37.6 C)] 98.6 F (37 C) (05/04 0430) Pulse Rate:  [78-104] 86 (05/04 0436) Resp:  [18-23] 18 (05/04 0430) BP: (112-152)/(45-79) 127/71 mmHg (05/04 0436) SpO2:  [95 %-100 %] 98 % (05/04 0430) Weight:  [261 lb 12.8 oz (118.752 kg)-270 lb (122.471 kg)] 261 lb 12.8 oz (118.752 kg) (05/03 2117) Physical Exam: General: awake, alert, sitting up in bed with friend at bedside HEENT: NCAT, PERRL, EOMI, supple thick neck  Cardiovascular: RRR, nml S1/2, no murmurs, carotid bruits difficult to assess 2/2 patient habitus  Respiratory: CTAB apart from faint  crackle on RLL, nml WOB on RA  Abdomen: obese, soft, NTND, normoactive  Extremities: WWP, trace edema bilaterally  Skin: no rashes or lesions  Neuro: A&OX3, no focal deficits  Laboratory:  Recent Labs Lab 11/12/13 1640 11/13/13 0335  WBC 5.0 3.2*  HGB 14.3 12.3*  HCT 40.2 35.7*   PLT 169 140*    Recent Labs Lab 11/12/13 1640 11/12/13 2235 11/13/13 0335  NA 138  --  136*  K 3.0*  --  3.5*  CL 96  --  98  CO2 22  --  24  BUN 25*  --  24*  CREATININE 1.74* 1.51* 1.38*  CALCIUM 9.3  --  8.4  PROT 8.2  --  7.2  BILITOT 1.2  --  0.9  ALKPHOS 178*  --  147*  ALT 88*  --  69*  AST 50*  --  43*  GLUCOSE 139*  --  120*     Imaging/Diagnostic Tests: IMPRESSION:  Right lower lobe pneumonia. Radiographic followup to resolution is  recommended.   EKG- sinus with uwaves in II, III, avf, and V5 with twave inversion   Langston Masker, MD 11/13/2013, 7:31 AM PGY-1, [place] Intern pager: [phone], text pages welcome

## 2013-11-13 NOTE — H&P (Signed)
FMTS Attending Note  I personally saw and evaluated the patient. The plan of care was discussed with the resident team. I agree with the assessment and plan as documented by the resident.   Patient was seen and evaluated at 0900 on 11/13/2013. Andrew Arnold is a pleasant 58 year old male with past medical history of hypertension and hyperlipidemia who is admitted for a syncopal episode while visiting a family member in the hospital. Patient reports one week history of worsening shortness of breath, productive cough, generalized malaise. He reports decreased by mouth intake over the past few days. When he was visiting his family member he suddenly became flushed and lightheaded, he apparently passed out however does not recall the episode. Please refer to resident note for additional history of present illness. Patient currently denies chest pain or shortness of breath, he tolerated his breakfast well, denies abdominal pain, no changes in his current bowel habits, he denies a history of myocardial infarction or CHF. Patient denies orthopnea or PND.  Vitals: Reviewed General: Pleasant African American male, no acute distress HEENT: Normocephalic, pupils are equal round and reactive to light, extraocular movements are intact, no scleral icterus, moist mucous members, uvula midline, no pharyngeal erythema or exudate noted, neck was supple Cardiac: Regular in rhythm, S1 and S2 present, no murmurs, no heaves or thrills, no JVD Respiratory: Clear to auscultation bilaterally, normal effort Abdomen: Obese, soft, nontender, normal bowel sounds Extremities: No edema, 2+ radial pulses bilaterally, 2 posterior cells pedis pulses bilaterally  Reviewed lab work since time of admission. EKG was concerning for T wave inversions in the inferior leads, chest x-ray consistent with right lower lobe pneumonia  Assessment and plan: 58 year old male admitted with syncopal episode 1. Syncope-suspect due to dehydration from  community acquired pneumonia, given EKG changes Will set of troponins and check echocardiogram, initiate aspirin and statin therapy 2. Right lower lobe pneumonia-likely contributing to syncopal episode, agree with azithromycin 3. EKG changes - trend troponins, consider cardiology referral 4. Acute kidney injury-likely secondary to radiation from community acquired pneumonia, continue IV fluid resuscitation and monitor creatinine  Dossie Arbour MD

## 2013-11-13 NOTE — Progress Notes (Signed)
Pharmacy Antibiotic Consult : Initial Note   Pharmacy consulted to manage antibiotic dose in 12 YOM for RLL pneumonia. Currently only on azithromycin 250 mg by mouth daily. The dose is appropriate and does not need any renal dose adjustment. Pharmacy will sign off for now.   Albertina Parr, PharmD.  Clinical Pharmacist Pager [phone]

## 2013-11-13 NOTE — Progress Notes (Signed)
FMTS Attending Note  I personally saw and evaluated the patient. The plan of care was discussed with the resident team. I agree with the assessment and plan as documented by the resident.   Hristopher Missildine MD 

## 2013-11-14 LAB — BASIC METABOLIC PANEL
BUN: 12 mg/dL (ref 6–23)
CO2: 22 mEq/L (ref 19–32)
Calcium: 8.3 mg/dL — ABNORMAL LOW (ref 8.4–10.5)
Chloride: 106 mEq/L (ref 96–112)
Creatinine, Ser: 1 mg/dL (ref 0.50–1.35)
GFR, EST NON AFRICAN AMERICAN: 80 mL/min — AB (ref >90)
Glucose, Bld: 151 mg/dL — ABNORMAL HIGH (ref 70–99)
POTASSIUM: 3.7 meq/L (ref 3.7–5.3)
SODIUM: 141 meq/L (ref 137–147)

## 2013-11-14 MED ORDER — ENALAPRIL MALEATE 20 MG PO TABS: 10.0000 mg | ORAL_TABLET | Freq: Every day | ORAL | Status: DC

## 2013-11-14 MED ORDER — AZITHROMYCIN 250 MG PO TABS: ORAL_TABLET | ORAL | Status: DC

## 2013-11-14 NOTE — Discharge Instructions (Signed)
[name], Andrew Arnold were seen in the hospital for a syncopal episode assc with urinary incontinence. We obtained an EKG of heart and cycled cardiac enzymes to evaluate for possible cardiac injury. We also obtained an ECHOcardiogram of your heart to look at your valves. It did show some thickening of your heart muscle as well as some difficulty with relaxation. It will be important to modify your cardiac risk factors when you go home. Continue taking ASA 81 and atorvastatin. We also looked at your thyroid fxn as well as tested you for diabetes. Your hemoglobin A1C was elevated, indicating that you will need to modify your diet to prevent diabetic changes. You may also need to go medication but hopefully you can modify through diet and exercise. Please call your PCP for follow up appointment ASAP. You should also f/up with a cardiologist for consideration of an outpatient stress test. When you go home should you experience another syncopal episode, nausea and vomiting associated chest pain or confusion and sweating please call 911 immediately as these could be signs of a medical emergency.

## 2013-11-14 NOTE — Discharge Summary (Signed)
FMTS Attending Note  I personally saw and evaluated the patient. The plan of care was discussed with the resident team. I agree with the assessment and plan as documented by the resident.   Patient asymptomatic today. No further syncopal episodes. Breathing status has improved.  Echo: EF 60-65%, LVH, grade I diastolic dysfunction  Stable for discharge. Will need outpatient cardiology follow up given ?history of arrythmia and EKG changes. Patient counseled that he may benefit from outpatient stress test and OSA evaluation. Continue home Diltiazem and Enalapril. Hold HCTZ given syncopal episode.   Dossie Arbour MD

## 2013-11-14 NOTE — Progress Notes (Signed)
Patient discharged to home.  Patient alert, oriented, verbally responsive, breathing regular and non-labored throughout, no s/s of distress noted throughout, no c/o pain throughout.  Discharge instructions verbalized to patient and friend at bedside.  Both verbalized understanding throughout.  Patient left unit per wheelchair accompanied by Apolonio Schneiders CNA.  Diabetes meal planning education administered to patient upon discharge.  Landin Tallon 11/14/2013 3:20 PM

## 2013-11-14 NOTE — Discharge Summary (Signed)
[facility] Discharge Summary  Patient name: Andrew Arnold Medical record number: [id number] Date of birth: 11/20/1955 Age: 58 y.o. Gender: male Date of Admission: 11/12/2013  Date of Discharge: 11/14/2013 Admitting Physician: Lupita Dawn, MD  Primary Care Provider: Robert Bellow, MD Consultants: none  Indication for Hospitalization: syncope, CAP (RLL PNA)  Discharge Diagnoses/Problem List:  CAP Syncope HTN HLD AKI Hypokalemia Diastolic dysfunction (grade I) Mild LVH  Disposition: home   Discharge Condition: stable  Discharge Exam:  BP 122/46  Pulse 71  Temp(Src) 98.6 F (37 C) (Oral)  Resp 18  Ht '5\' 9"'  (1.753 m)  Wt 269 lb (122.018 kg)  BMI 39.71 kg/m2  SpO2 98% General: awake, alert, sitting up in bed with friend at bedside  HEENT: NCAT, PERRL, EOMI, supple thick neck  Cardiovascular: RRR, nml S1/2, no murmurs, carotid bruits difficult to assess 2/2 patient habitus  Respiratory: CTAB apart from faint crackle on RLL, nml WOB on RA  Abdomen: obese, soft, NTND, normoactive  Extremities: WWP, trace edema bilaterally  Skin: no rashes or lesions  Neuro: A&OX3, no focal deficits  Brief Hospital Course:  Andrew Arnold is a 58 y.o. male presenting with syncope with urinary incontinence, found to have RLL PNA. PMH is significant for HTN, HLD   #Pulm: In the ED pt with tmax 99.7, HR 104, BP 112/59. CT head obtained, negative. Lactic acid 2.22. CMET with mild transaminitis 50/88, alk phos 178. Cr 1.74, K 3.0. itrop neg. WBC 5.0, hgb 14.3. Urine with >300 protein and 15 ketones. Spec grav 1.030. CXR obtained suggestive of RLL PNA. S/p rocephin and azithro. Of note sister also with PNA. On admission lungs otherwise largely clear to auscultation. CURB-65 score 2 (no confusion, BUN >19, RR <30l SBP >40 but diastolic <81) making moderate risk 6.8% 30 day mortality. Pt given one additional dose of rocephin (for possible coverage of UTI) and  was d/c to complete 5 day course of azithro for CAP  #Syncopal episode assc with loss of urinary continence: Pt with syncopal episode while visiting family member in hospital (in light of recent poor PO intake, likely related to the above). No seizure like activity noted. Etiology felt to be vasovagal vs cardiogenic vs seizure; CT head negative; TSH 1.68. No further changes/syncopal events after rehydration with IVF.   #CV: Pt with hx of hyperlipidemia/HTN. Initial EKG changes concerning for cardiac etiology of syncope with uwaves and twave inversions. Repeat EKG unchanged. Trops cycled, negative. Echo obtained given presence of PVCs throughout admission showing mild LVH and grade 1 diastolic dysfxn. Cont on ASA 81 and ator 80. Enalapril, HCTZ held in light of dehydration/AKI. Cont'd on dilt? (unclear indication??) On discharge pt started on 1/2 home dose enalapril.   #Renal: AKI, proteinuria >300; CK elevated to 646. Cr improved 1.74. Likely component of decreased PO intake and excess GI losses (through stool) and assc ATN given urine with granular casts. Albumin 3.9 and total protein 8.2. Subsequent u/a with hgb and RBCs TNTC. Pt given 2 doses of IV rocephin (in light of above). Ucx neg. Could consider repeat outpt U/A for resolution and possible protein/cr ratio, FENa and Renal US. Denies recent hx of polydipsia/polyuria or polyphagia. Acanthosis present and pt with central adiposity. HgbA1C 6.3. Will defer to PCP for dietary/exercise interventions before initiation of metformin.   #FEN/GI: Pt initially with hypokalemia and lactic acidosis (to 2.22). Pt with report of poor PO intake in last several days. Improved with rehydration with IVF  and KCl. Reports UTD with colonoscopy (is due this year when turns 58)   Issues for Follow Up:  1. Need for weight loss/dietary changes vs metformin in A1C of 6.3 2. Rec outpt f.up on BMET for AKI vs CKD (discharge Cr 1.00) 3. Completion of azithro course 4.  Cardiology follow up for outpt myoview given PVCs seen during admission, ECHO with mild LVH and grade I diasystolic dysfxn 5. Need for diltiazem? Unclear indication apart from patient reporting arrhythmia in 1980s  Significant Procedures: None  Significant Labs and Imaging:   Recent Labs Lab 11/12/13 1640 11/13/13 0335  WBC 5.0 3.2*  HGB 14.3 12.3*  HCT 40.2 35.7*  PLT 169 140*    Recent Labs Lab 11/12/13 1640 11/12/13 2235 11/13/13 0335 11/14/13 0915  NA 138  --  136* 141  K 3.0*  --  3.5* 3.7  CL 96  --  98 106  CO2 22  --  24 22  GLUCOSE 139*  --  120* 151*  BUN 25*  --  24* 12  CREATININE 1.74* 1.51* 1.38* 1.00  CALCIUM 9.3  --  8.4 8.3*  ALKPHOS 178*  --  147*  --   AST 50*  --  43*  --   ALT 88*  --  69*  --   ALBUMIN 3.9  --  3.3*  --    ECHO 11/13/13 Left ventricle: The cavity size was normal. Wall thickness was increased in a pattern of mild LVH. Systolic function was normal. The estimated ejection fraction was in the range of 60% to 65%. Wall motion was normal; there were no regional wall motion abnormalities. Doppler parameters are consistent with abnormal left ventricular relaxation (grade 1 diastolic dysfunction  EKG- uwaves in i, ii, avr, avf  Results/Tests Pending at Time of Discharge: none  Discharge Medications:    Medication List    STOP taking these medications       hydrochlorothiazide 50 MG tablet  Commonly known as:  HYDRODIURIL      TAKE these medications       ascorbic acid 500 MG tablet  Commonly known as:  VITAMIN C  Take 500 mg by mouth every morning.     aspirin 81 MG tablet  Take 81 mg by mouth every morning.     atorvastatin 80 MG tablet  Commonly known as:  LIPITOR  Take 80 mg by mouth at bedtime.     azithromycin 250 MG tablet  Commonly known as:  ZITHROMAX  Take one pill every day for next 3 days     B COMPLEX 100 PO  Take 1 tablet by mouth every morning.     diltiazem 240 MG 24 hr capsule  Commonly known  as:  DILACOR XR  Take 240 mg by mouth at bedtime.     enalapril 20 MG tablet  Commonly known as:  VASOTEC  Take 0.5 tablets (10 mg total) by mouth at bedtime. Until you see your PCP     esomeprazole 20 MG capsule  Commonly known as:  NEXIUM  Take 20 mg by mouth daily as needed (for heart burn).        Discharge Instructions: Please refer to Patient Instructions section of EMR for full details.  Patient was counseled important signs and symptoms that should prompt return to medical care, changes in medications, dietary instructions, activity restrictions, and follow up appointments.   Follow-Up Appointments: Follow-up Information   Schedule an appointment as soon as possible for a  visit with Robert Bellow, MD. (For post hospital follow up)    Specialty:  Family Medicine   Contact information:   [place]  [phone]       Schedule an appointment as soon as possible for a visit with [place]. (For consideration of an outpatient stress test)    Contact information:   [place] [address] [phone]      Langston Masker, MD 11/14/2013, 12:58 PM PGY-1, [place]

## 2013-12-11 ENCOUNTER — Telehealth: Payer: Self-pay | Admitting: *Deleted

## 2013-12-11 NOTE — Telephone Encounter (Signed)
Pt was referred by Dr. Vickey Sages office for screening colonoscopy. His last one according to our records was 01/2004 by Dr/ Rourk and his next to be in 10 yrs.  I returned his call and LMOM for a return call.

## 2013-12-11 NOTE — Telephone Encounter (Signed)
Pt called to schedule a colonoscopy. Please advise [phone] [facility] if he don't answer

## 2013-12-12 NOTE — Telephone Encounter (Signed)
Pt returned Andrew Arnold' call. Please advise [phone]

## 2013-12-12 NOTE — Telephone Encounter (Signed)
Pt wants to be called early next week to get scheduled for his colonoscopy. His last one was 02/06/2004.

## 2013-12-19 NOTE — Telephone Encounter (Signed)
LMOM for a return call.  

## 2013-12-27 ENCOUNTER — Ambulatory Visit (INDEPENDENT_AMBULATORY_CARE_PROVIDER_SITE_OTHER): Payer: BC Managed Care – PPO

## 2013-12-27 DIAGNOSIS — J309 Allergic rhinitis, unspecified: Secondary | ICD-10-CM

## 2014-01-11 NOTE — Telephone Encounter (Signed)
Pt is scheduled for OV with Laban Emperor, NP on 02/01/2014 at 8:00 AM . Pt not sure if he has hemorrhoids, he has had some spotting of blood on tissue.   LMOM for him to return call if he has any questions.

## 2014-01-15 ENCOUNTER — Encounter: Payer: Self-pay | Admitting: Cardiovascular Disease

## 2014-01-15 ENCOUNTER — Ambulatory Visit (INDEPENDENT_AMBULATORY_CARE_PROVIDER_SITE_OTHER): Payer: BC Managed Care – PPO | Admitting: Cardiovascular Disease

## 2014-01-15 VITALS — BP 148/91 | HR 60 | Ht 69.0 in | Wt 252.2 lb

## 2014-01-15 DIAGNOSIS — I951 Orthostatic hypotension: Secondary | ICD-10-CM | POA: Insufficient documentation

## 2014-01-15 NOTE — Assessment & Plan Note (Signed)
My presents today for followup of an episode of orthostatic hypotension and subsequent syncope. Was diagnosed as having pneumonia and was dehydrated at the time. He also was noted to have some premature ventricular contractions. He was thought that he might need a stress test because of the presence of these PVCs.  There is nothing that sounds anginal in his history. The fact that he had PVCs during this episode of dehydration  is not unusual. I've encouraged him to work on a good diet and exercise program. He needs to limit his carbohydrates as well as his salt. I've given him symptoms to look out for. He will comment back if he has any symptoms of chest pain or angina with exertion. I'll see him on an as-needed basis.

## 2014-01-15 NOTE — Progress Notes (Signed)
[name] Date of Birth  December 20, 1955       [facility] [address], [place], [place]  [id number]   [place], [place]  [address] [place]   Fax  [phone]     Fax [phone]  Problem List: 1. Orthostatic hypotension 2. PVCs 3, Hypertension 4. Hyperlipidemia 5, allergies   History of Present Illness:  Andrew Arnold is a 58 yo who had an episode of syncope while visiting family.  He had not eaten well for the previos 3-4 days.  He had crackers and a coke prior to going to church and then later went to the hospital to visit family  and had a syncopal episode.  He was taken down the the ER and was found to have pneumonia and dehydration.    He has better since that time.   He works as a Engineer, site.     He does not exercise as much as he should.    He's never had any episodes of angina when he does his daily activities. He does a lot of yard work without limitations. He was sent to [place] today to consider doing a stress test since he had some premature ventricular contractions when he was admitted to the hospital.  Current Outpatient Prescriptions on File Prior to Visit  Medication Sig Dispense Refill  . ascorbic acid (VITAMIN C) 500 MG tablet Take 500 mg by mouth every morning.       Marland Kitchen aspirin 81 MG tablet Take 81 mg by mouth every morning.       Marland Kitchen atorvastatin (LIPITOR) 80 MG tablet Take 80 mg by mouth at bedtime.       . B Complex Vitamins (B COMPLEX 100 PO) Take 1 tablet by mouth every morning.       . diltiazem (DILACOR XR) 240 MG 24 hr capsule Take 240 mg by mouth at bedtime.       Marland Kitchen esomeprazole (NEXIUM) 20 MG capsule Take 20 mg by mouth daily as needed (for heart burn).       No current facility-administered medications on file prior to visit.    No Known Allergies  Past Medical History  Diagnosis Date  . Allergic rhinitis due to pollen     on allergy shots  .  Hyperlipidemia   . HTN (hypertension)   . Arrhythmia     1980s hospitalized for HTN and ?irregular rhythm but no specific dx but resolved during hospitalization    Past Surgical History  Procedure Laterality Date  . Colonoscopy      @ age 19; reported as normal; recommended to repeat at age 11    History  Smoking status  . Never Smoker   Smokeless tobacco  . Not on file    History  Alcohol Use No    Family History  Problem Relation Age of Onset  . Cancer Mother   . Cirrhosis Father     cirrhosis of the liver  . Other Other     neice was on allergy vaccine when young    Reviw of Systems:  Reviewed in the HPI.  All other systems are negative.  Physical Exam: Blood pressure 148/91, pulse 60, height 5\' 9"  (1.753 m), weight 252 lb 3.2 oz (114.397 kg). Wt Readings from Last 3 Encounters:  01/15/14 252 lb 3.2 oz (114.397 kg)  11/14/13 269 lb (122.018 kg)  08/12/12 262 lb 3.2 oz (118.933 kg)     General: Well developed, well nourished, in no acute distress.  Head: Normocephalic, atraumatic, sclera non-icteric, mucus membranes are moist,   Neck: Supple. Carotids are 2 + without bruits. No JVD   Lungs: Clear   Heart: RR, norml S1S2  Abdomen: Soft, non-tender, non-distended with normal bowel sounds.  Msk:  Strength and tone are normal   Extremities: No clubbing or cyanosis. No edema.  Distal pedal pulses are 2+ and equal    Neuro: CN II - XII intact.  Alert and oriented X 3.   Psych:  Normal   ECG:   Assessment / Plan:

## 2014-01-15 NOTE — Patient Instructions (Signed)
Your physician recommends that you continue on your current medications as directed. Please refer to the Current Medication list given to you today.  Your physician recommends that you schedule a follow-up appointment in: as needed with Dr. Nahser  

## 2014-02-01 ENCOUNTER — Ambulatory Visit (INDEPENDENT_AMBULATORY_CARE_PROVIDER_SITE_OTHER): Payer: BC Managed Care – PPO | Admitting: Gastroenterology

## 2014-02-01 ENCOUNTER — Encounter: Payer: Self-pay | Admitting: Gastroenterology

## 2014-02-01 VITALS — BP 151/90 | HR 56 | Temp 97.0°F | Ht 69.0 in | Wt 252.8 lb

## 2014-02-01 DIAGNOSIS — Z1211 Encounter for screening for malignant neoplasm of colon: Secondary | ICD-10-CM

## 2014-02-01 MED ORDER — PEG 3350-KCL-NA BICARB-NACL 420 G PO SOLR: 4000.0000 mL | ORAL | Status: DC

## 2014-02-01 MED ORDER — HYDROCORTISONE 2.5 % RE CREA: 1.0000 "application " | TOPICAL_CREAM | Freq: Two times a day (BID) | RECTAL | Status: DC

## 2014-02-01 NOTE — Patient Instructions (Addendum)
We have scheduled you for a colonoscopy with Dr. Gala Romney in the near future.  Follow a high fiber diet; see attached. I have also sent a cream to your pharmacy to use twice a day for 7 days for hemorrhoids.   Further recommendations to follow.    High-Fiber Diet Fiber is found in fruits, vegetables, and grains. A high-fiber diet encourages the addition of more whole grains, legumes, fruits, and vegetables in your diet. The recommended amount of fiber for adult males is 38 g per day. For adult females, it is 25 g per day. Pregnant and lactating women should get 28 g of fiber per day. If you have a digestive or bowel problem, ask your caregiver for advice before adding high-fiber foods to your diet. Eat a variety of high-fiber foods instead of only a select few type of foods.  PURPOSE  To increase stool bulk.  To make bowel movements more regular to prevent constipation.  To lower cholesterol.  To prevent overeating. WHEN IS THIS DIET USED?  It may be used if you have constipation and hemorrhoids.  It may be used if you have uncomplicated diverticulosis (intestine condition) and irritable bowel syndrome.  It may be used if you need help with weight management.  It may be used if you want to add it to your diet as a protective measure against atherosclerosis, diabetes, and cancer. SOURCES OF FIBER  Whole-grain breads and cereals.  Fruits, such as apples, oranges, bananas, berries, prunes, and pears.  Vegetables, such as green peas, carrots, sweet potatoes, beets, broccoli, cabbage, spinach, and artichokes.  Legumes, such split peas, soy, lentils.  Almonds. FIBER CONTENT IN FOODS Starches and Grains / Dietary Fiber (g)  Cheerios, 1 cup / 3 g  Corn Flakes cereal, 1 cup / 0.7 g  Rice crispy treat cereal, 1 cup / 0.3 g  Instant oatmeal (cooked),  cup / 2 g  Frosted wheat cereal, 1 cup / 5.1 g  Brown, long-grain rice (cooked), 1 cup / 3.5 g  White, long-grain rice  (cooked), 1 cup / 0.6 g  Enriched macaroni (cooked), 1 cup / 2.5 g Legumes / Dietary Fiber (g)  Baked beans (canned, plain, or vegetarian),  cup / 5.2 g  Kidney beans (canned),  cup / 6.8 g  Pinto beans (cooked),  cup / 5.5 g Breads and Crackers / Dietary Fiber (g)  Plain or honey graham crackers, 2 squares / 0.7 g  Saltine crackers, 3 squares / 0.3 g  Plain, salted pretzels, 10 pieces / 1.8 g  Whole-wheat bread, 1 slice / 1.9 g  White bread, 1 slice / 0.7 g  Raisin bread, 1 slice / 1.2 g  Plain bagel, 3 oz / 2 g  Flour tortilla, 1 oz / 0.9 g  Corn tortilla, 1 small / 1.5 g  Hamburger or hotdog bun, 1 small / 0.9 g Fruits / Dietary Fiber (g)  Apple with skin, 1 medium / 4.4 g  Sweetened applesauce,  cup / 1.5 g  Banana,  medium / 1.5 g  Grapes, 10 grapes / 0.4 g  Orange, 1 small / 2.3 g  Raisin, 1.5 oz / 1.6 g  Melon, 1 cup / 1.4 g Vegetables / Dietary Fiber (g)  Green beans (canned),  cup / 1.3 g  Carrots (cooked),  cup / 2.3 g  Broccoli (cooked),  cup / 2.8 g  Peas (cooked),  cup / 4.4 g  Mashed potatoes,  cup / 1.6 g  Lettuce, 1  cup / 0.5 g  Corn (canned),  cup / 1.6 g  Tomato,  cup / 1.1 g Document Released: 06/29/2005 Document Revised: 12/29/2011 Document Reviewed: 10/01/2011 [employer] Patient Information 2015 [place] [place]. This information is not intended to replace advice given to you by your health care provider. Make sure you discuss any questions you have with your health care provider.

## 2014-02-01 NOTE — Assessment & Plan Note (Signed)
58 year old due for routine screening colonoscopy, with last in 2005 overall without significant findings or polyps. No FH of colorectal cancer. Painless low-volume hematochezia likely benign in nature, hemorrhoidal etiology. Provide Anusol BID in interim prior to colonoscopy; avoid straining, follow high fiber diet.   Proceed with TCS with Dr. Gala Romney in near future: the risks, benefits, and alternatives have been discussed with the patient in detail. The patient states understanding and desires to proceed.

## 2014-02-01 NOTE — Progress Notes (Signed)
Primary Care Physician:  Robert Bellow, MD Primary Gastroenterologist:  Dr. Gala Romney   Chief Complaint  Patient presents with  . Colonoscopy  . Rectal Bleeding    HPI:   Andrew Arnold presents today for a visit prior to routine screening colonoscopy. Last colonoscopy in 2005 with sigmoid diverticula, no polyps. No FH of colon cancer.   States he has seen spotty paper hematochezia in the past. Not a routine, continuous thing. Mild rectal itching, painless. No constipation or diarrhea. Will sometimes strain. No abdominal pain. No N/V. No GERD symptoms or dysphagia. Weight stable, good appetite.   Past Medical History  Diagnosis Date  . Allergic rhinitis due to pollen     on allergy shots  . Hyperlipidemia   . HTN (hypertension)   . Arrhythmia     1980s hospitalized for HTN and ?irregular rhythm but no specific dx but resolved during hospitalization    Past Surgical History  Procedure Laterality Date  . Colonoscopy  2005    Dr. Gala Romney: normal rectum, sigmoid diverticula in colonic mucosa    Current Outpatient Prescriptions  Medication Sig Dispense Refill  . ascorbic acid (VITAMIN C) 500 MG tablet Take 500 mg by mouth every morning.       Marland Kitchen aspirin 81 MG tablet Take 81 mg by mouth every morning.       Marland Kitchen atorvastatin (LIPITOR) 80 MG tablet Take 80 mg by mouth at bedtime.       . B Complex Vitamins (B COMPLEX 100 PO) Take 1 tablet by mouth every morning.       . diltiazem (DILACOR XR) 240 MG 24 hr capsule Take 240 mg by mouth at bedtime.       Marland Kitchen esomeprazole (NEXIUM) 20 MG capsule Take 20 mg by mouth daily as needed (for heart burn).      Marland Kitchen lisinopril (PRINIVIL,ZESTRIL) 20 MG tablet Take 20 mg by mouth daily.      . hydrocortisone (PROCTOSOL HC) 2.5 % rectal cream Place 1 application rectally 2 (two) times daily.  30 g  0  . polyethylene glycol-electrolytes (TRILYTE) 420 G solution Take 4,000 mLs by mouth as directed.  4000 mL  0   No current facility-administered  medications for this visit.    Allergies as of 02/01/2014  . (No Known Allergies)    Family History  Problem Relation Age of Onset  . Cancer Mother   . Cirrhosis Father     cirrhosis of the liver; alcohol related  . Other Other     neice was on allergy vaccine when young  . Colon cancer Neg Hx     History   Social History  . Marital Status: Single    Spouse Name: N/A    Number of Children: N/A  . Years of Education: N/A   Occupational History  . school counselor    Social History Main Topics  . Smoking status: Never Smoker   . Smokeless tobacco: Not on file  . Alcohol Use: No  . Drug Use: No  . Sexual Activity: Yes   Other Topics Concern  . 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  CV: Denies chest pain, heart palpitations, peripheral edema, syncope.  Resp: Denies shortness of breath at rest  or with exertion. Denies wheezing or cough.  GI: see HPI GU : Denies urinary burning, urinary frequency, urinary hesitancy MS: Denies joint pain, muscle weakness, cramps, or limitation of movement.  Derm: Denies rash, itching, dry skin Psych: Denies depression, anxiety, memory loss, and confusion Heme: Denies bruising, bleeding, and enlarged lymph nodes.  Physical Exam: BP 151/90  Pulse 56  Temp(Src) 97 F (36.1 C) (Oral)  Ht 5\' 9"  (1.753 m)  Wt 252 lb 12.8 oz (114.669 kg)  BMI 37.31 kg/m2 General:   Alert and oriented. Pleasant and cooperative. Well-nourished and well-developed.  Head:  Normocephalic and atraumatic. Eyes:  Without icterus, sclera clear and conjunctiva pink.  Ears:  Normal auditory acuity. Nose:  No deformity, discharge,  or lesions. Mouth:  No deformity or lesions, oral mucosa pink.  Neck:  Supple, without mass or thyromegaly. Lungs:  Clear to auscultation bilaterally. No wheezes, rales,  or rhonchi. No distress.  Heart:  S1, S2 present without murmurs appreciated.  Abdomen:  +BS, soft, non-tender and non-distended. No HSM noted. No guarding or rebound. No masses appreciated.  Rectal:  Deferred  Msk:  Symmetrical without gross deformities. Normal posture. Extremities:  Without clubbing or edema. Neurologic:  Alert and  oriented x4;  grossly normal neurologically. Skin:  Intact without significant lesions or rashes. Cervical Nodes:  No significant cervical adenopathy. Psych:  Alert and cooperative. Normal mood and affect.

## 2014-02-06 NOTE — Progress Notes (Signed)
cc'd to pcp 

## 2014-02-13 ENCOUNTER — Encounter (HOSPITAL_COMMUNITY): Payer: Self-pay | Admitting: Pharmacy Technician

## 2014-02-15 ENCOUNTER — Encounter (HOSPITAL_COMMUNITY): Payer: Self-pay

## 2014-02-15 ENCOUNTER — Ambulatory Visit (HOSPITAL_COMMUNITY)
Admission: RE | Admit: 2014-02-15 | Discharge: 2014-02-15 | Disposition: A | Discharge status: Home / Self Care | Payer: BC Managed Care – PPO | Source: Ambulatory Visit | Attending: Internal Medicine | Admitting: Internal Medicine

## 2014-02-15 ENCOUNTER — Encounter (HOSPITAL_COMMUNITY)
Admission: RE | Disposition: A | Discharge status: Home / Self Care | Payer: Self-pay | Source: Ambulatory Visit | Attending: Internal Medicine

## 2014-02-15 DIAGNOSIS — Z1211 Encounter for screening for malignant neoplasm of colon: Secondary | ICD-10-CM | POA: Insufficient documentation

## 2014-02-15 DIAGNOSIS — K5730 Diverticulosis of large intestine without perforation or abscess without bleeding: Secondary | ICD-10-CM | POA: Insufficient documentation

## 2014-02-15 DIAGNOSIS — D126 Benign neoplasm of colon, unspecified: Secondary | ICD-10-CM | POA: Insufficient documentation

## 2014-02-15 DIAGNOSIS — Z86010 Personal history of colonic polyps: Secondary | ICD-10-CM

## 2014-02-15 HISTORY — PX: COLONOSCOPY: SHX5424

## 2014-02-15 SURGERY — COLONOSCOPY
Anesthesia: Moderate Sedation

## 2014-02-15 MED ORDER — ONDANSETRON HCL 4 MG/2ML IJ SOLN
INTRAMUSCULAR | Status: DC | PRN
  Administered 2014-02-15: 4 mg via INTRAVENOUS

## 2014-02-15 MED ORDER — ONDANSETRON HCL 4 MG/2ML IJ SOLN
INTRAMUSCULAR | Status: AC
  Filled 2014-02-15: qty 2

## 2014-02-15 MED ORDER — MEPERIDINE HCL 100 MG/ML IJ SOLN
INTRAMUSCULAR | Status: DC | PRN
  Administered 2014-02-15: 25 mg via INTRAVENOUS
  Administered 2014-02-15: 50 mg via INTRAVENOUS

## 2014-02-15 MED ORDER — SIMETHICONE 40 MG/0.6ML PO SUSP
ORAL | Status: DC | PRN
  Administered 2014-02-15: 14:00:00

## 2014-02-15 MED ORDER — MIDAZOLAM HCL 5 MG/5ML IJ SOLN
INTRAMUSCULAR | Status: AC
  Filled 2014-02-15: qty 10

## 2014-02-15 MED ORDER — MEPERIDINE HCL 100 MG/ML IJ SOLN
INTRAMUSCULAR | Status: AC
  Filled 2014-02-15: qty 2

## 2014-02-15 MED ORDER — SODIUM CHLORIDE 0.9 % IV SOLN
INTRAVENOUS | Status: DC
  Administered 2014-02-15: 14:00:00 via INTRAVENOUS

## 2014-02-15 MED ORDER — MIDAZOLAM HCL 5 MG/5ML IJ SOLN
INTRAMUSCULAR | Status: DC | PRN
  Administered 2014-02-15: 2 mg via INTRAVENOUS
  Administered 2014-02-15: 1 mg via INTRAVENOUS
  Administered 2014-02-15: 2 mg via INTRAVENOUS

## 2014-02-15 NOTE — Op Note (Signed)
[facility] [address] [place], [address]   COLONOSCOPY PROCEDURE REPORT  PATIENT: Andrew Arnold, Andrew Arnold  MR#:         [id number] BIRTHDATE: 18-Oct-1955 , 58  yrs. old GENDER: Male ENDOSCOPIST: R.  Garfield Cornea, MD FACP FACG REFERRED BY:  [name], M.D. PROCEDURE DATE:  02/15/2014 PROCEDURE:     Colonoscopy with biopsy  INDICATIONS: Average risk colon cancer screening examination  INFORMED CONSENT:  The risks, benefits, alternatives and imponderables including but not limited to bleeding, perforation as well as the possibility of a missed lesion have been reviewed.  The potential for biopsy, lesion removal, etc. have also been discussed.  Questions have been answered.  All parties agreeable. Please see the history and physical in the medical record for more information.  MEDICATIONS: Versed 5 mg IV and Demerol 75 mg IV in divided doses. Zofran 4 mg IV.  DESCRIPTION OF PROCEDURE:  After a digital rectal exam was performed, the EC-[id number] ([id number])  colonoscope was advanced from the anus through the rectum and colon to the area of the cecum, ileocecal valve and appendiceal orifice.  The cecum was deeply intubated.  These structures were well-seen and photographed for the record.  From the level of the cecum and ileocecal valve, the scope was slowly and cautiously withdrawn.  The mucosal surfaces were carefully surveyed utilizing scope tip deflection to facilitate fold flattening as needed.  The scope was pulled down into the rectum where a thorough examination including retroflexion was performed.    FINDINGS:  Adequate preparation. Normal rectum. Shallow left-sided diverticula; the remainder of the colonic mucosa appeared normal aside from one diminutive polyp in the cecum.  THERAPEUTIC / DIAGNOSTIC MANEUVERS PERFORMED:  The above-mentioned polyps cold biopsied/removed  COMPLICATIONS: none  CECAL WITHDRAWAL TIME:  11 minutes  IMPRESSION:   Colonic diverticulosis. Colonic polyp-removed as described above  RECOMMENDATIONS: Followup on pathology.   _______________________________ eSigned:  R. Garfield Cornea, MD FACP [facility] 02/15/2014 2:48 PM   CC:

## 2014-02-15 NOTE — Interval H&P Note (Signed)
History and Physical Interval Note:  02/15/2014 2:07 PM  Andrew Arnold  has presented today for surgery, with the diagnosis of SCREENING COLONOSCOPY  The various methods of treatment have been discussed with the patient and family. After consideration of risks, benefits and other options for treatment, the patient has consented to  Procedure(s) with comments: COLONOSCOPY (N/A) - 2:00 as a surgical intervention .  The patient's history has been reviewed, patient examined, no change in status, stable for surgery.  I have reviewed the patient's chart and labs.  Questions were answered to the patient's satisfaction.     Andrew Arnold  No change. Colonoscopy per plan.The risks, benefits, limitations, alternatives and imponderables have been reviewed with the patient. Questions have been answered. All parties are agreeable.

## 2014-02-15 NOTE — H&P (View-Only) (Signed)
Primary Care Physician:  Robert Bellow, MD Primary Gastroenterologist:  Dr. Gala Romney   Chief Complaint  Patient presents with  . Colonoscopy  . Rectal Bleeding    HPI:   Andrew Arnold presents today for a visit prior to routine screening colonoscopy. Last colonoscopy in 2005 with sigmoid diverticula, no polyps. No FH of colon cancer.   States he has seen spotty paper hematochezia in the past. Not a routine, continuous thing. Mild rectal itching, painless. No constipation or diarrhea. Will sometimes strain. No abdominal pain. No N/V. No GERD symptoms or dysphagia. Weight stable, good appetite.   Past Medical History  Diagnosis Date  . Allergic rhinitis due to pollen     on allergy shots  . Hyperlipidemia   . HTN (hypertension)   . Arrhythmia     1980s hospitalized for HTN and ?irregular rhythm but no specific dx but resolved during hospitalization    Past Surgical History  Procedure Laterality Date  . Colonoscopy  2005    Dr. Gala Romney: normal rectum, sigmoid diverticula in colonic mucosa    Current Outpatient Prescriptions  Medication Sig Dispense Refill  . ascorbic acid (VITAMIN C) 500 MG tablet Take 500 mg by mouth every morning.       Marland Kitchen aspirin 81 MG tablet Take 81 mg by mouth every morning.       Marland Kitchen atorvastatin (LIPITOR) 80 MG tablet Take 80 mg by mouth at bedtime.       . B Complex Vitamins (B COMPLEX 100 PO) Take 1 tablet by mouth every morning.       . diltiazem (DILACOR XR) 240 MG 24 hr capsule Take 240 mg by mouth at bedtime.       Marland Kitchen esomeprazole (NEXIUM) 20 MG capsule Take 20 mg by mouth daily as needed (for heart burn).      Marland Kitchen lisinopril (PRINIVIL,ZESTRIL) 20 MG tablet Take 20 mg by mouth daily.      . hydrocortisone (PROCTOSOL HC) 2.5 % rectal cream Place 1 application rectally 2 (two) times daily.  30 g  0  . polyethylene glycol-electrolytes (TRILYTE) 420 G solution Take 4,000 mLs by mouth as directed.  4000 mL  0   No current facility-administered  medications for this visit.    Allergies as of 02/01/2014  . (No Known Allergies)    Family History  Problem Relation Age of Onset  . Cancer Mother   . Cirrhosis Father     cirrhosis of the liver; alcohol related  . Other Other     neice was on allergy vaccine when young  . Colon cancer Neg Hx     History   Social History  . Marital Status: Single    Spouse Name: N/A    Number of Children: N/A  . Years of Education: N/A   Occupational History  . school counselor    Social History Main Topics  . Smoking status: Never Smoker   . Smokeless tobacco: Not on file  . Alcohol Use: No  . Drug Use: No  . Sexual Activity: Yes   Other Topics Concern  . 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  CV: Denies chest pain, heart palpitations, peripheral edema, syncope.  Resp: Denies shortness of breath at rest  or with exertion. Denies wheezing or cough.  GI: see HPI GU : Denies urinary burning, urinary frequency, urinary hesitancy MS: Denies joint pain, muscle weakness, cramps, or limitation of movement.  Derm: Denies rash, itching, dry skin Psych: Denies depression, anxiety, memory loss, and confusion Heme: Denies bruising, bleeding, and enlarged lymph nodes.  Physical Exam: BP 151/90  Pulse 56  Temp(Src) 97 F (36.1 C) (Oral)  Ht 5\' 9"  (1.753 m)  Wt 252 lb 12.8 oz (114.669 kg)  BMI 37.31 kg/m2 General:   Alert and oriented. Pleasant and cooperative. Well-nourished and well-developed.  Head:  Normocephalic and atraumatic. Eyes:  Without icterus, sclera clear and conjunctiva pink.  Ears:  Normal auditory acuity. Nose:  No deformity, discharge,  or lesions. Mouth:  No deformity or lesions, oral mucosa pink.  Neck:  Supple, without mass or thyromegaly. Lungs:  Clear to auscultation bilaterally. No wheezes, rales,  or rhonchi. No distress.  Heart:  S1, S2 present without murmurs appreciated.  Abdomen:  +BS, soft, non-tender and non-distended. No HSM noted. No guarding or rebound. No masses appreciated.  Rectal:  Deferred  Msk:  Symmetrical without gross deformities. Normal posture. Extremities:  Without clubbing or edema. Neurologic:  Alert and  oriented x4;  grossly normal neurologically. Skin:  Intact without significant lesions or rashes. Cervical Nodes:  No significant cervical adenopathy. Psych:  Alert and cooperative. Normal mood and affect.

## 2014-02-15 NOTE — Discharge Instructions (Addendum)
Colonoscopy Discharge Instructions  Read the instructions outlined below and refer to this sheet in the next few weeks. These discharge instructions provide you with general information on caring for yourself after you leave the hospital. Your doctor may also give you specific instructions. While your treatment has been planned according to the most current medical practices available, unavoidable complications occasionally occur. If you have any problems or questions after discharge, call Dr. Gala Romney at [phone]. ACTIVITY  You may resume your regular activity, but move at a slower pace for the next 24 hours.   Take frequent rest periods for the next 24 hours.   Walking will help get rid of the air and reduce the bloated feeling in your belly (abdomen).   No driving for 24 hours (because of the medicine (anesthesia) used during the test).    Do not sign any important legal documents or operate any machinery for 24 hours (because of the anesthesia used during the test).  NUTRITION  Drink plenty of fluids.   You may resume your normal diet as instructed by your doctor.   Begin with a light meal and progress to your normal diet. Heavy or fried foods are harder to digest and may make you feel sick to your stomach (nauseated).   Avoid alcoholic beverages for 24 hours or as instructed.  MEDICATIONS  You may resume your normal medications unless your doctor tells you otherwise.  WHAT YOU CAN EXPECT TODAY  Some feelings of bloating in the abdomen.   Passage of more gas than usual.   Spotting of blood in your stool or on the toilet paper.  IF YOU HAD POLYPS REMOVED DURING THE COLONOSCOPY:  No aspirin products for 7 days or as instructed.   No alcohol for 7 days or as instructed.   Eat a soft diet for the next 24 hours.  FINDING OUT THE RESULTS OF YOUR TEST Not all test results are available during your visit. If your test results are not back during the visit, make an appointment  with your caregiver to find out the results. Do not assume everything is normal if you have not heard from your caregiver or the medical facility. It is important for you to follow up on all of your test results.  SEEK IMMEDIATE MEDICAL ATTENTION IF:  You have more than a spotting of blood in your stool.   Your belly is swollen (abdominal distention).   You are nauseated or vomiting.   You have a temperature over 101.   Diverticulosis and polyp information provided  Further recommendations to follow pending review of pathology report  Begin Benefiber 2 teaspoons twice daily  If you have further rectal bleeding you need to let me know  You have abdominal pain or discomfort that is severe or gets worse throughout the day.   Diverticulosis Diverticulosis is the condition that develops when small pouches (diverticula) form in the wall of your colon. Your colon, or large intestine, is where water is absorbed and stool is formed. The pouches form when the inside layer of your colon pushes through weak spots in the outer layers of your colon. CAUSES  No one knows exactly what causes diverticulosis. RISK FACTORS Being older than 19. Your risk for this condition increases with age. Diverticulosis is rare in people younger than 40 years. By age 32, almost everyone has it. Eating a low-fiber diet. Being frequently constipated. Being overweight. Not getting enough exercise. Smoking. Taking over-the-counter pain medicines, like aspirin and ibuprofen. SYMPTOMS  Most people with diverticulosis do not have symptoms. DIAGNOSIS  Because diverticulosis often has no symptoms, health care providers often discover the condition during an exam for other colon problems. In many cases, a health care provider will diagnose diverticulosis while using a flexible scope to examine the colon (colonoscopy). TREATMENT  If you have never developed an infection related to diverticulosis, you may not need  treatment. If you have had an infection before, treatment may include: Eating more fruits, vegetables, and grains. Taking a fiber supplement. Taking a live bacteria supplement (probiotic). Taking medicine to relax your colon. HOME CARE INSTRUCTIONS  Drink at least 6-8 glasses of water each day to prevent constipation. Try not to strain when you have a bowel movement. Keep all follow-up appointments. If you have had an infection before: Increase the fiber in your diet as directed by your health care provider or dietitian. Take a dietary fiber supplement if your health care provider approves. Only take medicines as directed by your health care provider. SEEK MEDICAL CARE IF:  You have abdominal pain. You have bloating. You have cramps. You have not gone to the bathroom in 3 days. SEEK IMMEDIATE MEDICAL CARE IF:  Your pain gets worse. Yourbloating becomes very bad. You have a fever or chills, and your symptoms suddenly get worse. You begin vomiting. You have bowel movements that are bloody or black. MAKE SURE YOU: Understand these instructions. Will watch your condition. Will get help right away if you are not doing well or get worse. Document Released: 03/26/2004 Document Revised: 07/04/2013 Document Reviewed: 05/24/2013 [facility] Patient Information 2015 [place], [place]. This information is not intended to replace advice given to you by your health care provider. Make sure you discuss any questions you have with your health care provider.  Colon Polyps Polyps are lumps of extra tissue growing inside the body. Polyps can grow in the large intestine (colon). Most colon polyps are noncancerous (benign). However, some colon polyps can become cancerous over time. Polyps that are larger than a pea may be harmful. To be safe, caregivers remove and test all polyps. CAUSES  Polyps form when mutations in the genes cause your cells to grow and divide even though no more tissue is needed. RISK  FACTORS There are a number of risk factors that can increase your chances of getting colon polyps. They include:  Being older than 50 years.  Family history of colon polyps or colon cancer.  Long-term colon diseases, such as colitis or Crohn disease.  Being overweight.  Smoking.  Being inactive.  Drinking too much alcohol. SYMPTOMS  Most small polyps do not cause symptoms. If symptoms are present, they may include:  Blood in the stool. The stool may look dark red or black.  Constipation or diarrhea that lasts longer than 1 week. DIAGNOSIS People often do not know they have polyps until their caregiver finds them during a regular checkup. Your caregiver can use 4 tests to check for polyps:  Digital rectal exam. The caregiver wears gloves and feels inside the rectum. This test would find polyps only in the rectum.  Barium enema. The caregiver puts a liquid called barium into your rectum before taking X-rays of your colon. Barium makes your colon look white. Polyps are dark, so they are easy to see in the X-ray pictures.  Sigmoidoscopy. A thin, flexible tube (sigmoidoscope) is placed into your rectum. The sigmoidoscope has a light and tiny camera in it. The caregiver uses the sigmoidoscope to look at the last  third of your colon.  Colonoscopy. This test is like sigmoidoscopy, but the caregiver looks at the entire colon. This is the most common method for finding and removing polyps. TREATMENT  Any polyps will be removed during a sigmoidoscopy or colonoscopy. The polyps are then tested for cancer. PREVENTION  To help lower your risk of getting more colon polyps:  Eat plenty of fruits and vegetables. Avoid eating fatty foods.  Do not smoke.  Avoid drinking alcohol.  Exercise every day.  Lose weight if recommended by your caregiver.  Eat plenty of calcium and folate. Foods that are rich in calcium include milk, cheese, and broccoli. Foods that are rich in folate include  chickpeas, kidney beans, and spinach. HOME CARE INSTRUCTIONS Keep all follow-up appointments as directed by your caregiver. You may need periodic exams to check for polyps. SEEK MEDICAL CARE IF: You notice bleeding during a bowel movement. Document Released: 03/25/2004 Document Revised: 09/21/2011 Document Reviewed: 09/08/2011 [facility] Patient Information 2015 [place], [place]. This information is not intended to replace advice given to you by your health care provider. Make sure you discuss any questions you have with your health care provider.

## 2014-02-20 ENCOUNTER — Encounter: Payer: Self-pay | Admitting: Internal Medicine

## 2014-02-21 ENCOUNTER — Encounter (HOSPITAL_COMMUNITY): Payer: Self-pay | Admitting: Internal Medicine

## 2014-04-13 ENCOUNTER — Ambulatory Visit (INDEPENDENT_AMBULATORY_CARE_PROVIDER_SITE_OTHER): Payer: BC Managed Care – PPO

## 2014-04-13 DIAGNOSIS — J309 Allergic rhinitis, unspecified: Secondary | ICD-10-CM

## 2014-05-25 ENCOUNTER — Encounter: Payer: Self-pay | Admitting: Internal Medicine

## 2014-08-06 ENCOUNTER — Ambulatory Visit (INDEPENDENT_AMBULATORY_CARE_PROVIDER_SITE_OTHER): Payer: BLUE CROSS/BLUE SHIELD

## 2014-08-06 DIAGNOSIS — J309 Allergic rhinitis, unspecified: Secondary | ICD-10-CM

## 2014-12-24 ENCOUNTER — Telehealth: Payer: Self-pay | Admitting: Internal Medicine

## 2014-12-24 NOTE — Telephone Encounter (Signed)
Called pt. Andrew Arnold. When pt.calls back to let me know when he want's to pick up his vac. I'll put it and close the note.

## 2014-12-27 ENCOUNTER — Telehealth: Payer: Self-pay | Admitting: Internal Medicine

## 2014-12-27 ENCOUNTER — Ambulatory Visit (INDEPENDENT_AMBULATORY_CARE_PROVIDER_SITE_OTHER): Payer: BLUE CROSS/BLUE SHIELD

## 2014-12-27 DIAGNOSIS — J309 Allergic rhinitis, unspecified: Secondary | ICD-10-CM | POA: Diagnosis not present

## 2015-01-03 NOTE — Telephone Encounter (Signed)
Tammy, has this been taken care of?

## 2015-01-03 NOTE — Telephone Encounter (Signed)
Yes, pt. Came and picked up his vac. The other day. My bad, I thought I had taken care of it,it wasn't in my box(basket).

## 2015-01-04 ENCOUNTER — Ambulatory Visit (INDEPENDENT_AMBULATORY_CARE_PROVIDER_SITE_OTHER): Payer: BLUE CROSS/BLUE SHIELD | Admitting: Internal Medicine

## 2015-01-04 ENCOUNTER — Encounter: Payer: Self-pay | Admitting: Internal Medicine

## 2015-01-04 VITALS — BP 160/80 | HR 65 | Ht 68.0 in | Wt 262.0 lb

## 2015-01-04 DIAGNOSIS — J301 Allergic rhinitis due to pollen: Secondary | ICD-10-CM | POA: Diagnosis not present

## 2015-01-04 NOTE — Patient Instructions (Signed)
We can continue allergy vaccine 1:10, given at [facility] in [place]  If needed, there would be no problem for you to add an otc antihistamine like         Claritin/ loratadine, Allegra/ fexofenadine or Zyrtec/ cetirizine        And/ or an allergy nasal spray like Flonase/ fluticasone

## 2015-01-04 NOTE — Assessment & Plan Note (Signed)
He describes personal satisfaction that allergy vaccine continues to be helpful for him, pointing out that he has had much less trouble with seasonal allergic rhinitis. The spring was particularly good. We discussed options of nasal steroid sprays and antihistamines and introduced concept of sublingual immunotherapy as alternatives. We see no reason to change what he is doing.

## 2015-01-04 NOTE — Progress Notes (Signed)
08/13/11- 55 yoM never smoker followed for allergic rhinitis complicated by hypertension He continues to get his allergy vaccine injections through a clinic in [place]. He is strongly convinced they help him. He says if he misses one or 2 shots, he begins to notice significant increase in nasal congestion and rhinorrhea. He has no lower respiratory problems including wheezing, chest pain or any cough attributable to his ACE inhibitor. He is prone to increased nasal symptoms season changes, especially spring and fall. We discussed use of a supplemental antihistamine if necessary.  08/12/12- 3 yoM never smoker followed for allergic rhinitis complicated by hypertension follows for :still on vaccine. still getting in [place].  Allergy vaccine 1:10 given at [facility] in [place], weekly without problems. He is satisfied that it helps. Spring and fall seasons are worst. No asthma. Risk-benefit discussion done.  01/04/15- 59 yoM never smoker followed for allergic rhinitis complicated by hypertension Allergy vaccine 1:10 given at [facility] in [place] For: Pt states breathing is doing well. Pt states no new complaints. He feels allergy vaccine is still helpful in worth continuing. He reports a good spring this year without routine need for antihistamine or nasal spray. He is comfortable now.   ROS-see HPI Constitutional:   No-   weight loss, night sweats, fevers, chills, fatigue, lassitude. HEENT:   No-  headaches, difficulty swallowing, tooth/dental problems, sore throat,       No-  sneezing, itching, ear ache, nasal congestion, post nasal drip,  CV:  No-   chest pain, orthopnea, PND, swelling in lower extremities, anasarca, dizziness, palpitations Resp: No-   shortness of breath with exertion or at rest.              No-   productive cough,  No non-productive cough,  No- coughing up of blood.              No-   change in color of mucus.  No- wheezing.   Skin: No-   rash or  lesions. GI:  No-   heartburn, indigestion, abdominal pain, nausea, vomiting,  GU: MS:  No-   joint pain or swelling.  . Neuro-     nothing unusual Psych:  No- change in mood or affect. No depression or anxiety.  No memory loss.   OBJ- Physical Exam General- Alert, Oriented, Affect-appropriate, Distress- none acute,               +obese Skin- rash-none, lesions- none, excoriation- none Lymphadenopathy- none Head- atraumatic            Eyes- Gross vision intact, PERRLA, conjunctivae and secretions clear            Ears- Hearing, canals-normal            Nose- + mild turbinate edema, no-Septal dev, mucus, polyps, erosion, perforation             Throat- Mallampati III , mucosa clear , drainage- none, tonsils- atrophic Neck- flexible , trachea midline, no stridor , thyroid nl, carotid no bruit Chest - symmetrical excursion , unlabored           Heart/CV- RRR , no murmur , no gallop  , no rub, nl s1 s2                           - JVD- none , edema- none, stasis changes- none, varices- none  Lung- clear to P&A, wheeze- none, cough- none , dullness-none, rub- none           Chest wall-  Abd-  Br/ Gen/ Rectal- Not done, not indicated Extrem- cyanosis- none, clubbing, none, atrophy- none, strength- nl Neuro- grossly intact to observation

## 2015-01-25 NOTE — Telephone Encounter (Signed)
Date Mixed: 12/27/14 Vial: 1 Strength: 1:10 Here/Mail/Pick Up: pick-up Mixed By: tbs

## 2015-02-07 ENCOUNTER — Encounter (HOSPITAL_COMMUNITY): Payer: Self-pay | Admitting: *Deleted

## 2015-02-07 ENCOUNTER — Emergency Department (HOSPITAL_COMMUNITY)
Admission: EM | Admit: 2015-02-07 | Discharge: 2015-02-07 | Disposition: A | Discharge status: Home / Self Care | Payer: BLUE CROSS/BLUE SHIELD | Source: Home / Self Care | Attending: Family Medicine | Admitting: Family Medicine

## 2015-02-07 DIAGNOSIS — L739 Follicular disorder, unspecified: Secondary | ICD-10-CM | POA: Diagnosis not present

## 2015-02-07 MED ORDER — DOXYCYCLINE HYCLATE 100 MG PO CAPS: 100.0000 mg | ORAL_CAPSULE | Freq: Two times a day (BID) | ORAL | Status: DC

## 2015-02-07 NOTE — ED Notes (Signed)
Pt  Reports       A  Rash         X  2-3  Weeks        Rash  Spreads          Into  Groin  Area            Pt  denys  Starting  Any  New  meds  He  Displays  No angioedema     He  Is  Sitting  Upright  On the  Exam table  Speaking in  Complete sentances

## 2015-02-07 NOTE — Discharge Instructions (Signed)
Antibacterial soap, take all of medicine, return as needed or see your doctor

## 2015-02-07 NOTE — ED Provider Notes (Signed)
CSN: [id number]     Arrival date & time 02/07/15  1304 History   First MD Initiated Contact with Patient 02/07/15 1350     Chief Complaint  Patient presents with  . Rash   (Consider location/radiation/quality/duration/timing/severity/associated sxs/prior Treatment) Patient is a 59 y.o. Arnold presenting with rash. The history is provided by the patient.  Rash Location:  Leg Leg rash location:  L hip and L upper leg Quality: itchiness and redness   Severity:  Mild Onset quality:  Gradual Duration:  3 weeks Progression:  Unchanged Chronicity:  New Associated symptoms: no fever     Past Medical History  Diagnosis Date  . Allergic rhinitis due to pollen     on allergy shots  . Hyperlipidemia   . HTN (hypertension)   . Arrhythmia     1980s hospitalized for HTN and ?irregular rhythm but no specific dx but resolved during hospitalization   Past Surgical History  Procedure Laterality Date  . Colonoscopy  2005    Dr. Gala Romney: normal rectum, sigmoid diverticula in colonic mucosa  . Colonoscopy N/A 02/15/2014    Procedure: COLONOSCOPY;  Surgeon: Daneil Dolin, MD;  Location: [facility];  Service: Endoscopy;  Laterality: N/A;  2:00   Family History  Problem Relation Age of Onset  . Cancer Mother   . Cirrhosis Father     cirrhosis of the liver; alcohol related  . Other Other     neice was on allergy vaccine when young  . Colon cancer Neg Hx    History  Substance Use Topics  . Smoking status: Never Smoker   . Smokeless tobacco: Not on file  . Alcohol Use: No    Review of Systems  Constitutional: Negative.  Negative for fever.  Skin: Positive for rash.    Allergies  Review of patient's allergies indicates no known allergies.  Home Medications   Prior to Admission medications   Medication Sig Start Date End Date Taking? Authorizing Provider  ascorbic acid (VITAMIN C) 500 MG tablet Take 500 mg by mouth every morning.     Historical Provider, MD  aspirin 81 MG tablet  Take 81 mg by mouth every morning.     Historical Provider, MD  atorvastatin (LIPITOR) 80 MG tablet Take 80 mg by mouth at bedtime.     Historical Provider, MD  B Complex Vitamins (B COMPLEX 100 PO) Take 1 tablet by mouth every morning.     Historical Provider, MD  diltiazem (DILACOR XR) 240 MG 24 hr capsule Take 240 mg by mouth at bedtime.     Historical Provider, MD  esomeprazole (NEXIUM) 20 MG capsule Take 20 mg by mouth daily as needed (for heart burn).    Historical Provider, MD  lisinopril (PRINIVIL,ZESTRIL) 20 MG tablet Take 20 mg by mouth daily.    Historical Provider, MD   BP 143/86 mmHg  Pulse 65  Temp(Src) 97.8 F (36.6 C) (Oral)  Resp 16  SpO2 98% Physical Exam  Constitutional: He is oriented to person, place, and time. He appears well-developed and well-nourished. No distress.  Neurological: He is alert and oriented to person, place, and time.  Skin: Skin is warm and dry. Rash noted.  Papulopustular skin rash to left thigh/ buttock  Nursing note and vitals reviewed.   ED Course  Procedures (including critical care time) Labs Review Labs Reviewed - No data to display  Imaging Review No results found.   MDM  No diagnosis found.     Nelda Severe  Kindl, MD 02/07/15 1425

## 2015-03-25 ENCOUNTER — Ambulatory Visit (INDEPENDENT_AMBULATORY_CARE_PROVIDER_SITE_OTHER): Payer: BLUE CROSS/BLUE SHIELD

## 2015-03-25 DIAGNOSIS — J309 Allergic rhinitis, unspecified: Secondary | ICD-10-CM | POA: Diagnosis not present

## 2015-03-26 ENCOUNTER — Telehealth: Payer: Self-pay | Admitting: Internal Medicine

## 2015-03-26 NOTE — Telephone Encounter (Signed)
Date Mixed: 03/25/2015 Vial: A Strength: 1:10 Here/Mail/Pick Up: Mail Mixed By: Desmond Dike, CMA

## 2015-04-15 ENCOUNTER — Encounter: Payer: Self-pay | Admitting: Internal Medicine

## 2015-07-09 ENCOUNTER — Telehealth: Payer: Self-pay | Admitting: Internal Medicine

## 2015-07-09 ENCOUNTER — Ambulatory Visit: Payer: BLUE CROSS/BLUE SHIELD | Admitting: Internal Medicine

## 2015-07-09 NOTE — Telephone Encounter (Signed)
Pt. Called back and he decided to pick vac. Up 12/28 or 07/11/15. I explained to Mr. Demattos this is remainder of his 03/2015 vac.Marland Kitchen He had to see CDY  Then, in the mean time CDY  Told me to make him enough for a month's supply and he could pick the rest rest up at his appt.Marland Kitchen He and we forgot so this is the rest of it. Nothing further needed.

## 2015-07-09 NOTE — Telephone Encounter (Signed)
I will call pt. Explain and send vac. To him. Lm on pt.'s mobile, vac. Is ready either to pick-up or mail. Asked pt. To please call back if he had any questions.

## 2015-07-09 NOTE — Telephone Encounter (Signed)
Please advise Andrew Arnold. thanks 

## 2015-07-24 ENCOUNTER — Telehealth: Payer: Self-pay | Admitting: Internal Medicine

## 2015-07-25 NOTE — Telephone Encounter (Signed)
Spoke with Juluis Rainier at [facility], states that pt brought in 2 vials with his allergy serum that only had enough serum for 2 weeks worth of injections-Brenda was wanting to know why, and that we will need to make new vials for patient.   Spoke to [employer] about this, was told by Joellen Jersey to forward to [employer] as this pt needs a new vaccine vial made. Tammy please advise.  Thanks.

## 2015-07-25 NOTE — Telephone Encounter (Signed)
Allergy Serum Extract Date Mixed: 07/25/15 Vial: 1 Strength: 1:10 Here/Mail/Pick Up: mail Mixed By: tbs Last OV: 01/04/15 Pending OV: 01/07/16

## 2015-07-25 NOTE — Telephone Encounter (Signed)
Spoke with Andrew Arnold and explained to her as I did Andrew Arnold that what they have is the remainder of what was sent 03/2015. Pt. Is now out so I'll send him full vials(a 3 months supply). He saw CDY  01/04/15 and has an appt. This June. I will leave this encounter open so I can attach his vac. To it.

## 2015-07-26 DIAGNOSIS — J309 Allergic rhinitis, unspecified: Secondary | ICD-10-CM | POA: Diagnosis not present

## 2015-11-18 ENCOUNTER — Telehealth: Payer: Self-pay | Admitting: Internal Medicine

## 2015-11-18 DIAGNOSIS — J309 Allergic rhinitis, unspecified: Secondary | ICD-10-CM | POA: Diagnosis not present

## 2015-11-18 NOTE — Telephone Encounter (Signed)
Allergy Serum Extract Date Mixed: 11/18/15 Vial: 1 Strength: 1:10 Here/Mail/Pick Up: mail Mixed By: tbs Last OV: 01/04/15 Pending OV: 01/07/16

## 2016-01-06 ENCOUNTER — Ambulatory Visit (INDEPENDENT_AMBULATORY_CARE_PROVIDER_SITE_OTHER): Payer: BLUE CROSS/BLUE SHIELD | Admitting: Internal Medicine

## 2016-01-06 ENCOUNTER — Encounter: Payer: Self-pay | Admitting: Internal Medicine

## 2016-01-06 DIAGNOSIS — J302 Other seasonal allergic rhinitis: Secondary | ICD-10-CM | POA: Diagnosis not present

## 2016-01-06 DIAGNOSIS — J3089 Other allergic rhinitis: Secondary | ICD-10-CM

## 2016-01-06 NOTE — Patient Instructions (Signed)
Ok to continue allergy vaccine. We discussed changing to get your shots every other week till the vaccine supply is used up, or until we close the allergy clinic at this office early next year,  Please call if needed

## 2016-01-06 NOTE — Progress Notes (Signed)
08/13/11- 55 yoM never smoker followed for allergic rhinitis complicated by hypertension He continues to get his allergy vaccine injections through a clinic in [place]. He is strongly convinced they help him. He says if he misses one or 2 shots, he begins to notice significant increase in nasal congestion and rhinorrhea. He has no lower respiratory problems including wheezing, chest pain or any cough attributable to his ACE inhibitor. He is prone to increased nasal symptoms season changes, especially spring and fall. We discussed use of a supplemental antihistamine if necessary.  08/12/12- 76 yoM never smoker followed for allergic rhinitis complicated by hypertension follows for :still on vaccine. still getting in [place].  Allergy vaccine 1:10 given at [facility] in [place], weekly without problems. He is satisfied that it helps. Spring and fall seasons are worst. No asthma. Risk-benefit discussion done.  01/04/15- 59 yoM never smoker followed for allergic rhinitis complicated by hypertension Allergy vaccine 1:10 given at [facility] in [place] For: Pt states breathing is doing well. Pt states no new complaints. He feels allergy vaccine is still helpful in worth continuing. He reports a good spring this year without routine need for antihistamine or nasal spray. He is comfortable now.  01/06/2016- 60 year old male never smoker followed for Allergic rhinitis, complicated by HBP Allergy vaccine 1:10 given at [facility] in [place] He feels he is doing "great" with some seasonal stuffiness and drainage in spring and fall. Adequate control. No wheezing.  ROS-see HPI Constitutional:   No-   weight loss, night sweats, fevers, chills, fatigue, lassitude. HEENT:   No-  headaches, difficulty swallowing, tooth/dental problems, sore throat,       No-  sneezing, itching, ear ache, nasal congestion, post nasal drip,  CV:  No-   chest pain, orthopnea, PND, swelling in lower extremities,  anasarca, dizziness, palpitations Resp: No-   shortness of breath with exertion or at rest.              No-   productive cough,  No non-productive cough,  No- coughing up of blood.              No-   change in color of mucus.  No- wheezing.   Skin: No-   rash or lesions. GI:  No-   heartburn, indigestion, abdominal pain, nausea, vomiting,  GU: MS:  No-   joint pain or swelling.  . Neuro-     nothing unusual Psych:  No- change in mood or affect. No depression or anxiety.  No memory loss.   OBJ- Physical Exam General- Alert, Oriented, Affect-appropriate, Distress- none acute,  +obese Skin- rash-none, lesions- none, excoriation- none Lymphadenopathy- none Head- atraumatic            Eyes- Gross vision intact, PERRLA, conjunctivae and secretions clear            Ears- Hearing, canals-normal            Nose- + mild turbinate edema, no-Septal dev, mucus, polyps, erosion, perforation             Throat- Mallampati III , mucosa clear , drainage- none, tonsils- atrophic Neck- flexible , trachea midline, no stridor , thyroid nl, carotid no bruit Chest - symmetrical excursion , unlabored           Heart/CV- RRR , no murmur , no gallop  , no rub, nl s1 s2                           -  JVD- none , edema- none, stasis changes- none, varices- none           Lung- clear to P&A, wheeze- none, cough- none , dullness-none, rub- none           Chest wall-  Abd-  Br/ Gen/ Rectal- Not done, not indicated Extrem- cyanosis- none, clubbing, none, atrophy- none, strength- nl Neuro- grossly intact to observation

## 2016-04-23 ENCOUNTER — Telehealth: Payer: Self-pay | Admitting: Internal Medicine

## 2016-04-23 DIAGNOSIS — J309 Allergic rhinitis, unspecified: Secondary | ICD-10-CM | POA: Diagnosis not present

## 2016-04-23 NOTE — Telephone Encounter (Signed)
Allergy Serum Extract Date Mixed: 04/23/16 Vial: 1 Strength: 1:10 Here/Mail/Pick Up: mail Mixed By: tbs Last OV: 01/01/16 Pending OV: N/A

## 2016-04-26 NOTE — Assessment & Plan Note (Signed)
I explained plans to close the allergy program at this office late winter 2017. At that point I would suggest he will watch to see how he does with antihistamines and nasal sprays. He can transfer to another allergy practice at any time as needed.

## 2016-07-15 ENCOUNTER — Telehealth: Payer: Self-pay | Admitting: Internal Medicine

## 2016-07-15 NOTE — Telephone Encounter (Signed)
Or call his cell [phone]

## 2016-07-15 NOTE — Telephone Encounter (Signed)
Will forward to TS to follow up with pt about mixing his serum to last him till spring.  TS please advise. thanks

## 2016-07-15 NOTE — Telephone Encounter (Signed)
Called pt. Back and explained to him the allergy clinic is closed and I can't make up anymore vaccine. Pt. Was very understanding, he did get a letter but did not realize the allergy clinic was closing. Pt. Voiced he understood. Nothing further needed.

## 2017-08-29 ENCOUNTER — Observation Stay (HOSPITAL_COMMUNITY)
Admission: EM | Admit: 2017-08-29 | Discharge: 2017-08-30 | Disposition: A | Discharge status: Home / Self Care | Payer: BLUE CROSS/BLUE SHIELD | Attending: Family Medicine | Admitting: Family Medicine

## 2017-08-29 ENCOUNTER — Observation Stay (HOSPITAL_COMMUNITY): Payer: BLUE CROSS/BLUE SHIELD

## 2017-08-29 ENCOUNTER — Encounter (HOSPITAL_COMMUNITY): Payer: Self-pay | Admitting: Emergency Medicine

## 2017-08-29 ENCOUNTER — Other Ambulatory Visit: Payer: Self-pay

## 2017-08-29 ENCOUNTER — Emergency Department (HOSPITAL_COMMUNITY): Payer: BLUE CROSS/BLUE SHIELD

## 2017-08-29 DIAGNOSIS — Z79899 Other long term (current) drug therapy: Secondary | ICD-10-CM | POA: Diagnosis not present

## 2017-08-29 DIAGNOSIS — E860 Dehydration: Secondary | ICD-10-CM | POA: Diagnosis present

## 2017-08-29 DIAGNOSIS — A419 Sepsis, unspecified organism: Secondary | ICD-10-CM | POA: Insufficient documentation

## 2017-08-29 DIAGNOSIS — R079 Chest pain, unspecified: Secondary | ICD-10-CM | POA: Diagnosis not present

## 2017-08-29 DIAGNOSIS — K529 Noninfective gastroenteritis and colitis, unspecified: Secondary | ICD-10-CM | POA: Insufficient documentation

## 2017-08-29 DIAGNOSIS — Z7982 Long term (current) use of aspirin: Secondary | ICD-10-CM | POA: Diagnosis not present

## 2017-08-29 DIAGNOSIS — R7989 Other specified abnormal findings of blood chemistry: Secondary | ICD-10-CM | POA: Diagnosis not present

## 2017-08-29 DIAGNOSIS — K219 Gastro-esophageal reflux disease without esophagitis: Secondary | ICD-10-CM | POA: Insufficient documentation

## 2017-08-29 DIAGNOSIS — J988 Other specified respiratory disorders: Secondary | ICD-10-CM | POA: Diagnosis not present

## 2017-08-29 DIAGNOSIS — E872 Acidosis, unspecified: Secondary | ICD-10-CM

## 2017-08-29 DIAGNOSIS — R509 Fever, unspecified: Secondary | ICD-10-CM | POA: Diagnosis not present

## 2017-08-29 DIAGNOSIS — R197 Diarrhea, unspecified: Secondary | ICD-10-CM | POA: Diagnosis present

## 2017-08-29 DIAGNOSIS — I10 Essential (primary) hypertension: Secondary | ICD-10-CM | POA: Diagnosis present

## 2017-08-29 DIAGNOSIS — J069 Acute upper respiratory infection, unspecified: Secondary | ICD-10-CM | POA: Diagnosis not present

## 2017-08-29 DIAGNOSIS — E785 Hyperlipidemia, unspecified: Secondary | ICD-10-CM | POA: Diagnosis not present

## 2017-08-29 LAB — COMPREHENSIVE METABOLIC PANEL
ALBUMIN: 3.8 g/dL (ref 3.5–5.0)
ALT: 24 U/L (ref 17–63)
ANION GAP: 12 (ref 5–15)
AST: 21 U/L (ref 15–41)
Alkaline Phosphatase: 99 U/L (ref 38–126)
BILIRUBIN TOTAL: 0.8 mg/dL (ref 0.3–1.2)
BUN: 27 mg/dL — ABNORMAL HIGH (ref 6–20)
CALCIUM: 9.1 mg/dL (ref 8.9–10.3)
CO2: 26 mmol/L (ref 22–32)
CREATININE: 1.21 mg/dL (ref 0.61–1.24)
Chloride: 100 mmol/L — ABNORMAL LOW (ref 101–111)
GFR calc Af Amer: >60 mL/min (ref >60)
GFR calc non Af Amer: >60 mL/min (ref >60)
GLUCOSE: 130 mg/dL — AB (ref 65–99)
Potassium: 3.6 mmol/L (ref 3.5–5.1)
Sodium: 138 mmol/L (ref 135–145)
TOTAL PROTEIN: 7.7 g/dL (ref 6.5–8.1)

## 2017-08-29 LAB — URINALYSIS, ROUTINE W REFLEX MICROSCOPIC
BACTERIA UA: NONE SEEN
Bilirubin Urine: NEGATIVE
GLUCOSE, UA: NEGATIVE mg/dL
HGB URINE DIPSTICK: NEGATIVE
Ketones, ur: NEGATIVE mg/dL
Leukocytes, UA: NEGATIVE
NITRITE: NEGATIVE
PROTEIN: 100 mg/dL — AB
Specific Gravity, Urine: 1.027 (ref 1.005–1.030)
pH: 5 (ref 5.0–8.0)

## 2017-08-29 LAB — D-DIMER, QUANTITATIVE (NOT AT ARMC): D DIMER QUANT: 6.11 ug{FEU}/mL — AB (ref 0.00–0.50)

## 2017-08-29 LAB — CBC WITH DIFFERENTIAL/PLATELET
BASOS ABS: 0 10*3/uL (ref 0.0–0.1)
BASOS PCT: 0 %
EOS ABS: 0 10*3/uL (ref 0.0–0.7)
EOS PCT: 0 %
HEMATOCRIT: 41.9 % (ref 39.0–52.0)
Hemoglobin: 13.7 g/dL (ref 13.0–17.0)
Lymphocytes Relative: 13 %
Lymphs Abs: 0.4 10*3/uL — ABNORMAL LOW (ref 0.7–4.0)
MCH: 31.1 pg (ref 26.0–34.0)
MCHC: 32.7 g/dL (ref 30.0–36.0)
MCV: 95.2 fL (ref 78.0–100.0)
MONO ABS: 0.3 10*3/uL (ref 0.1–1.0)
MONOS PCT: 9 %
NEUTROS ABS: 2.6 10*3/uL (ref 1.7–7.7)
Neutrophils Relative %: 78 %
PLATELETS: 202 10*3/uL (ref 150–400)
RBC: 4.4 MIL/uL (ref 4.22–5.81)
RDW: 14.1 % (ref 11.5–15.5)
WBC: 3.3 10*3/uL — ABNORMAL LOW (ref 4.0–10.5)

## 2017-08-29 LAB — TROPONIN I
Troponin I: <0.03 ng/mL (ref <0.03)
Troponin I: <0.03 ng/mL (ref <0.03)

## 2017-08-29 LAB — I-STAT CG4 LACTIC ACID, ED
Lactic Acid, Venous: 2.25 mmol/L (ref 0.5–1.9)
Lactic Acid, Venous: 2.11 mmol/L (ref 0.5–1.9)

## 2017-08-29 LAB — TSH: TSH: 0.333 u[IU]/mL — ABNORMAL LOW (ref 0.350–4.500)

## 2017-08-29 LAB — INFLUENZA PANEL BY PCR (TYPE A & B)
INFLAPCR: NEGATIVE
Influenza B By PCR: NEGATIVE

## 2017-08-29 IMAGING — CT CT ANGIO CHEST
2 of 6 series · 18 of 46 positions shown · IV contrast (Isovue)
Comparison: Chest radiograph from earlier today.

CLINICAL DATA: Dyspnea.  Elevated D-dimer.

EXAM:
CT ANGIOGRAPHY CHEST WITH CONTRAST
TECHNIQUE: Multidetector CT imaging of the chest was performed using the
standard protocol during bolus administration of intravenous
contrast. Multiplanar CT image reconstructions and MIPs were
obtained to evaluate the vascular anatomy.
CONTRAST:  80 cc [01] IOPAMIDOL ([01]) INJECTION 76%

[Series 5: thins · axial · 0.72mm/px · z∈[+1315,+1609]mm · 15 of 323 slices shown]
[im 15/323  lung]
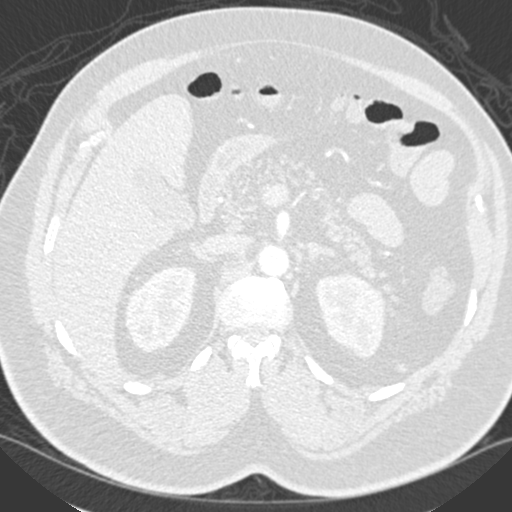
[im 43/323  soft-tissue]
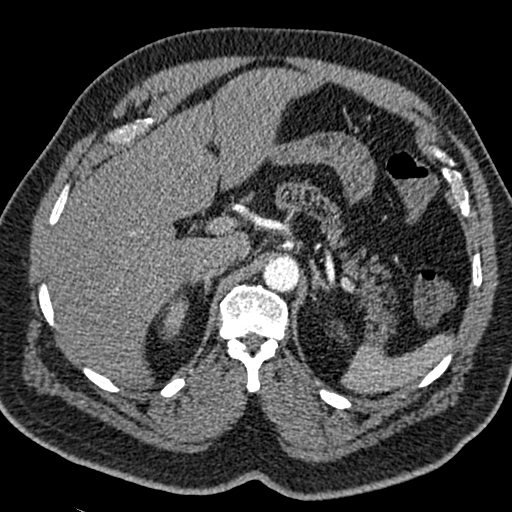
[im 57/323  lung]
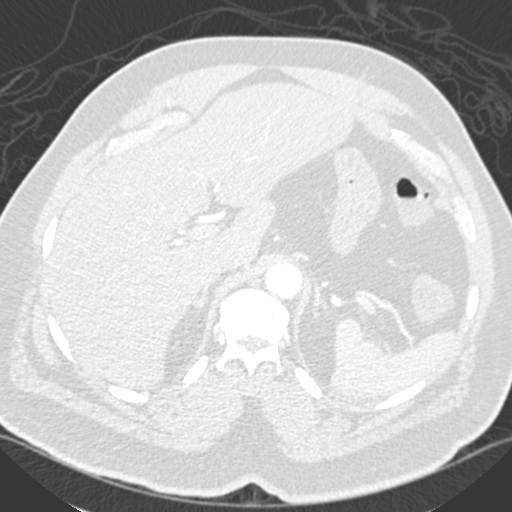
[im 85/323  soft-tissue]
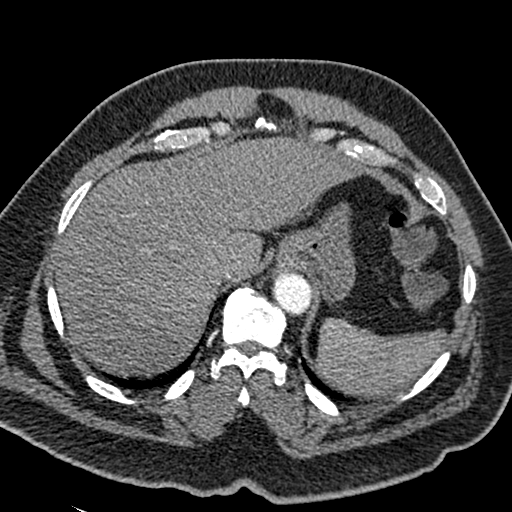
[im 99/323  lung]
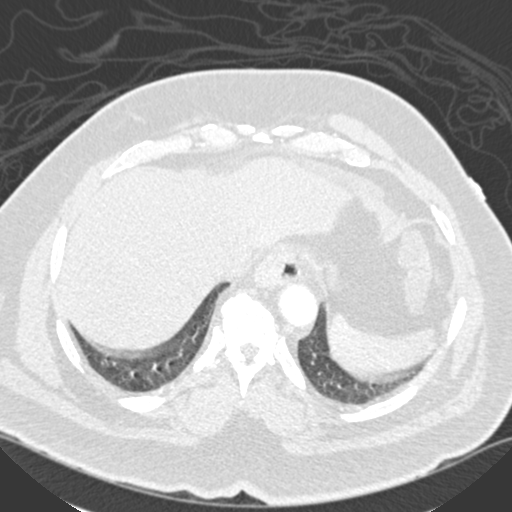
[im 127/323  soft-tissue]
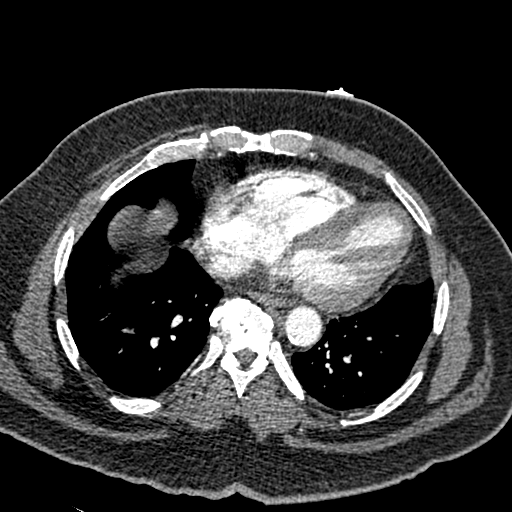
[im 141/323  lung]
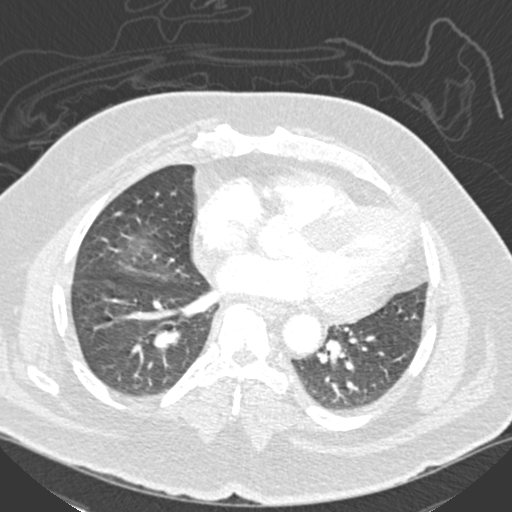
[im 169/323  soft-tissue]
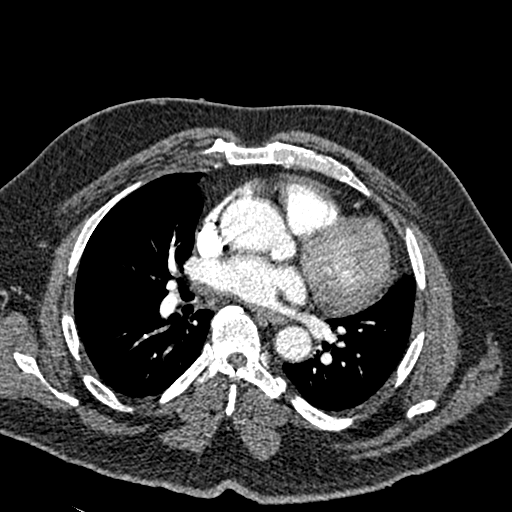
[im 183/323  lung]
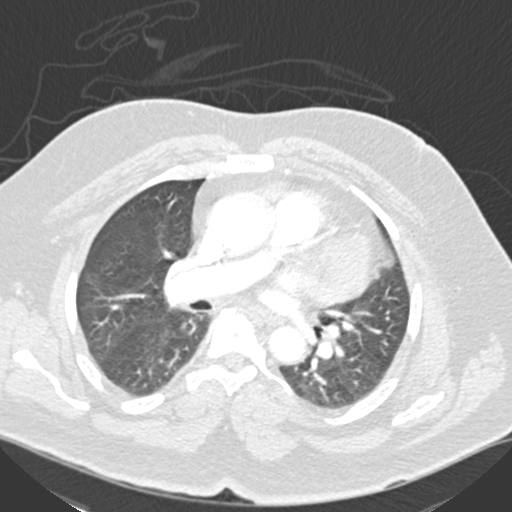
[im 197/323  soft-tissue]
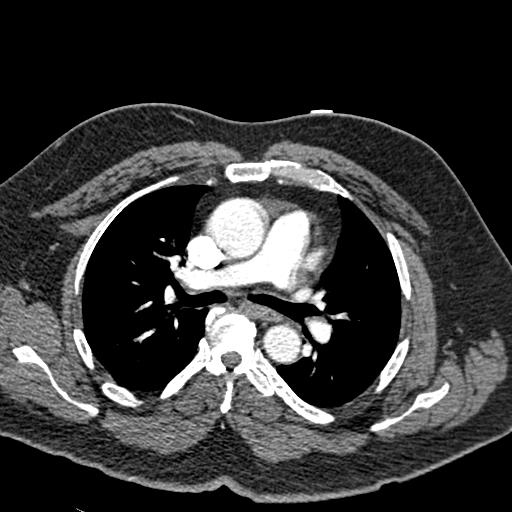
[im 225/323  lung]
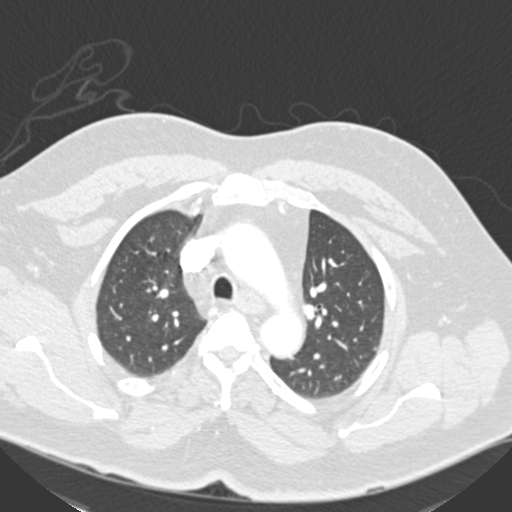
[im 239/323  soft-tissue]
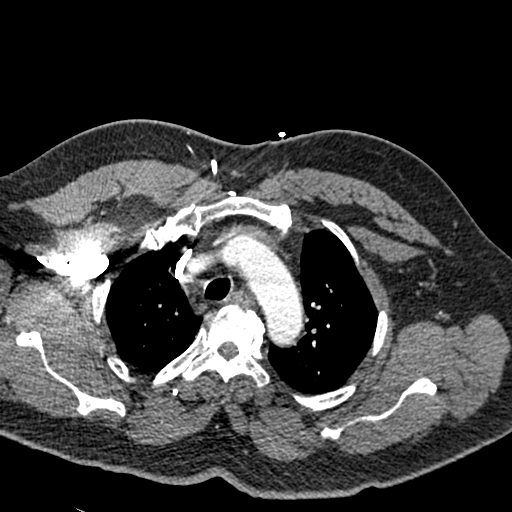
[im 267/323  lung]
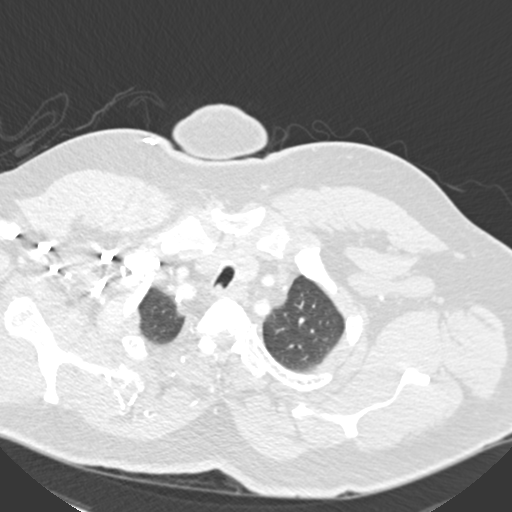
[im 281/323  soft-tissue]
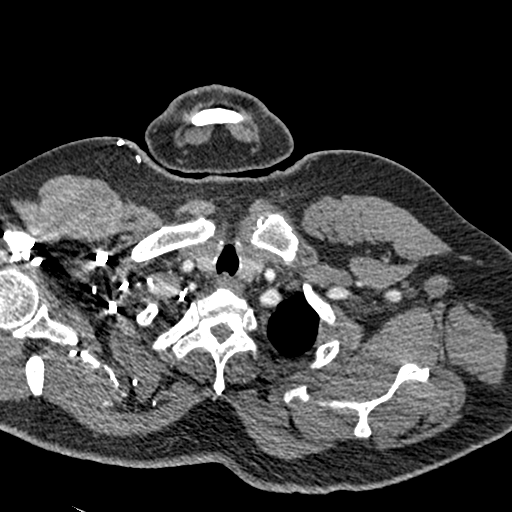
[im 309/323  lung]
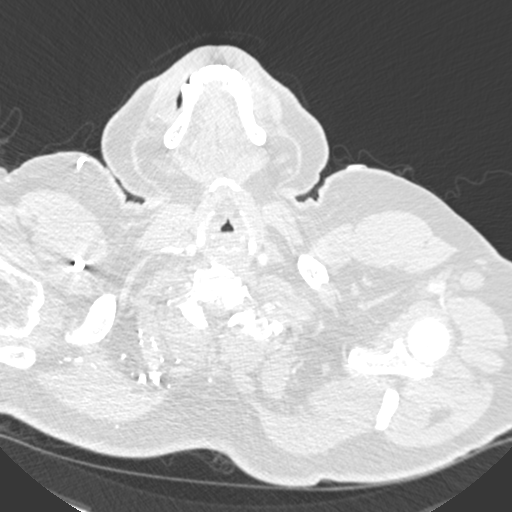

[Series 7: coronal mpr · coronal · 0.63mm/px · 3 of 162 slices shown]
[im 41/162  soft-tissue]
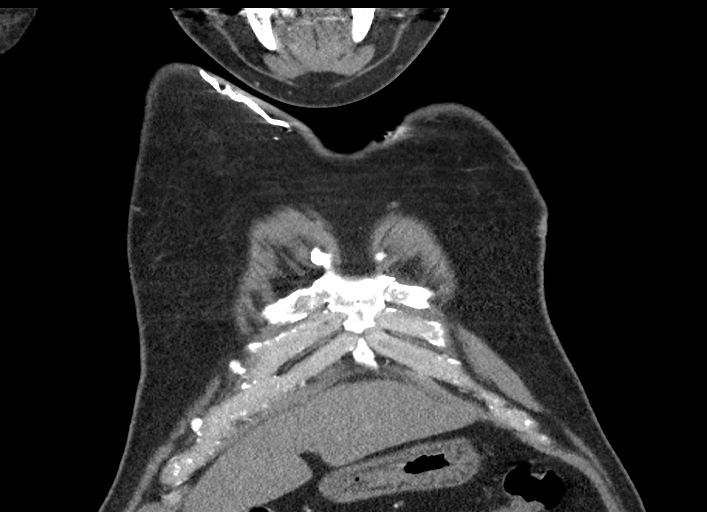
[im 81/162  soft-tissue]
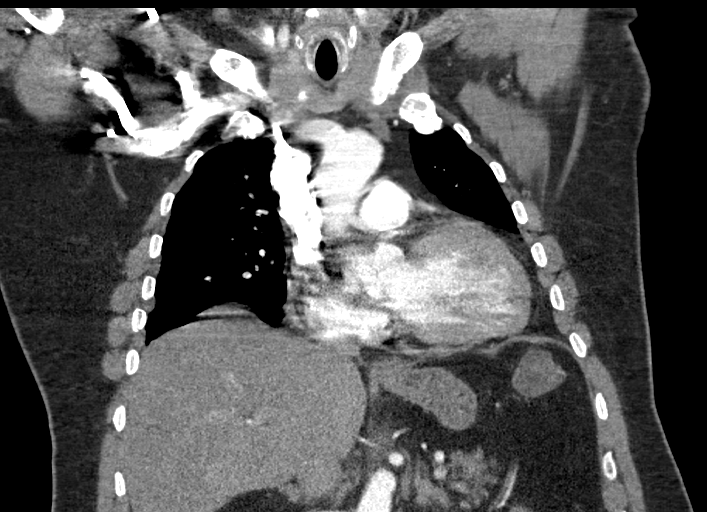
[im 121/162  soft-tissue]
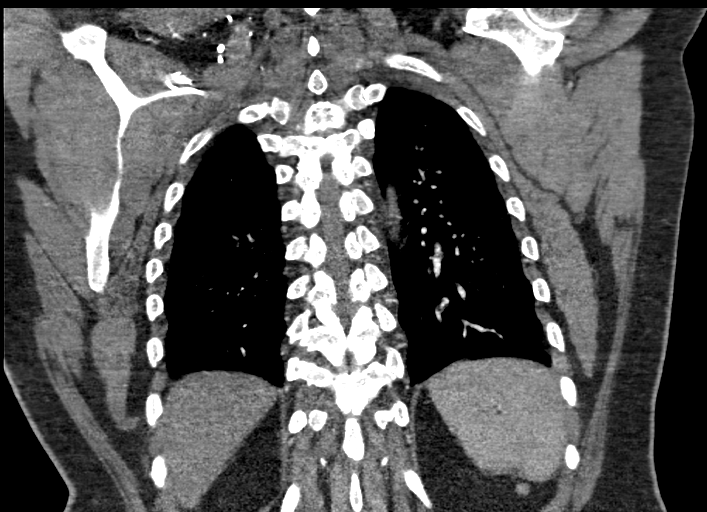

[18 of 46 positions shown; findings below may reference images not displayed]

FINDINGS: Cardiovascular: The study is moderate quality for the evaluation of
pulmonary embolism, with some motion degradation limiting evaluation
of the segmental and subsegmental branches. There are no convincing
filling defects in the central, lobar, segmental or subsegmental
pulmonary artery branches to suggest acute pulmonary embolism.
Atherosclerotic nonaneurysmal thoracic aorta. Normal caliber
pulmonary arteries. Top-normal heart size. No significant
pericardial fluid/thickening.

Mediastinum/Nodes: No discrete thyroid nodules. Unremarkable
esophagus. No pathologically enlarged axillary, mediastinal or hilar
lymph nodes.

Lungs/Pleura: No pneumothorax. No pleural effusion. No acute
consolidative airspace disease, lung masses or significant pulmonary
nodules.

Upper abdomen: Small hiatal hernia.

Musculoskeletal: No aggressive appearing focal osseous lesions.
Marked thoracic spondylosis.

Review of the MIP images confirms the above findings.
IMPRESSION: 1. Motion degraded scan. No convincing evidence of pulmonary
embolism.
2. No acute pulmonary disease.
3. Small hiatal hernia.

Aortic Atherosclerosis ([01]-[01]).

## 2017-08-29 IMAGING — CR DG CHEST 1V PORT
1 series · 2 of 2 positions shown · non-contrast
Comparison: [DATE]

CLINICAL DATA: Chest pain with syncopal episode and diarrhea.

EXAM:
PORTABLE CHEST 1 VIEW

[Series 1: portable · 0.17mm/px · 2 of 2 slices shown]
[im 1/2]
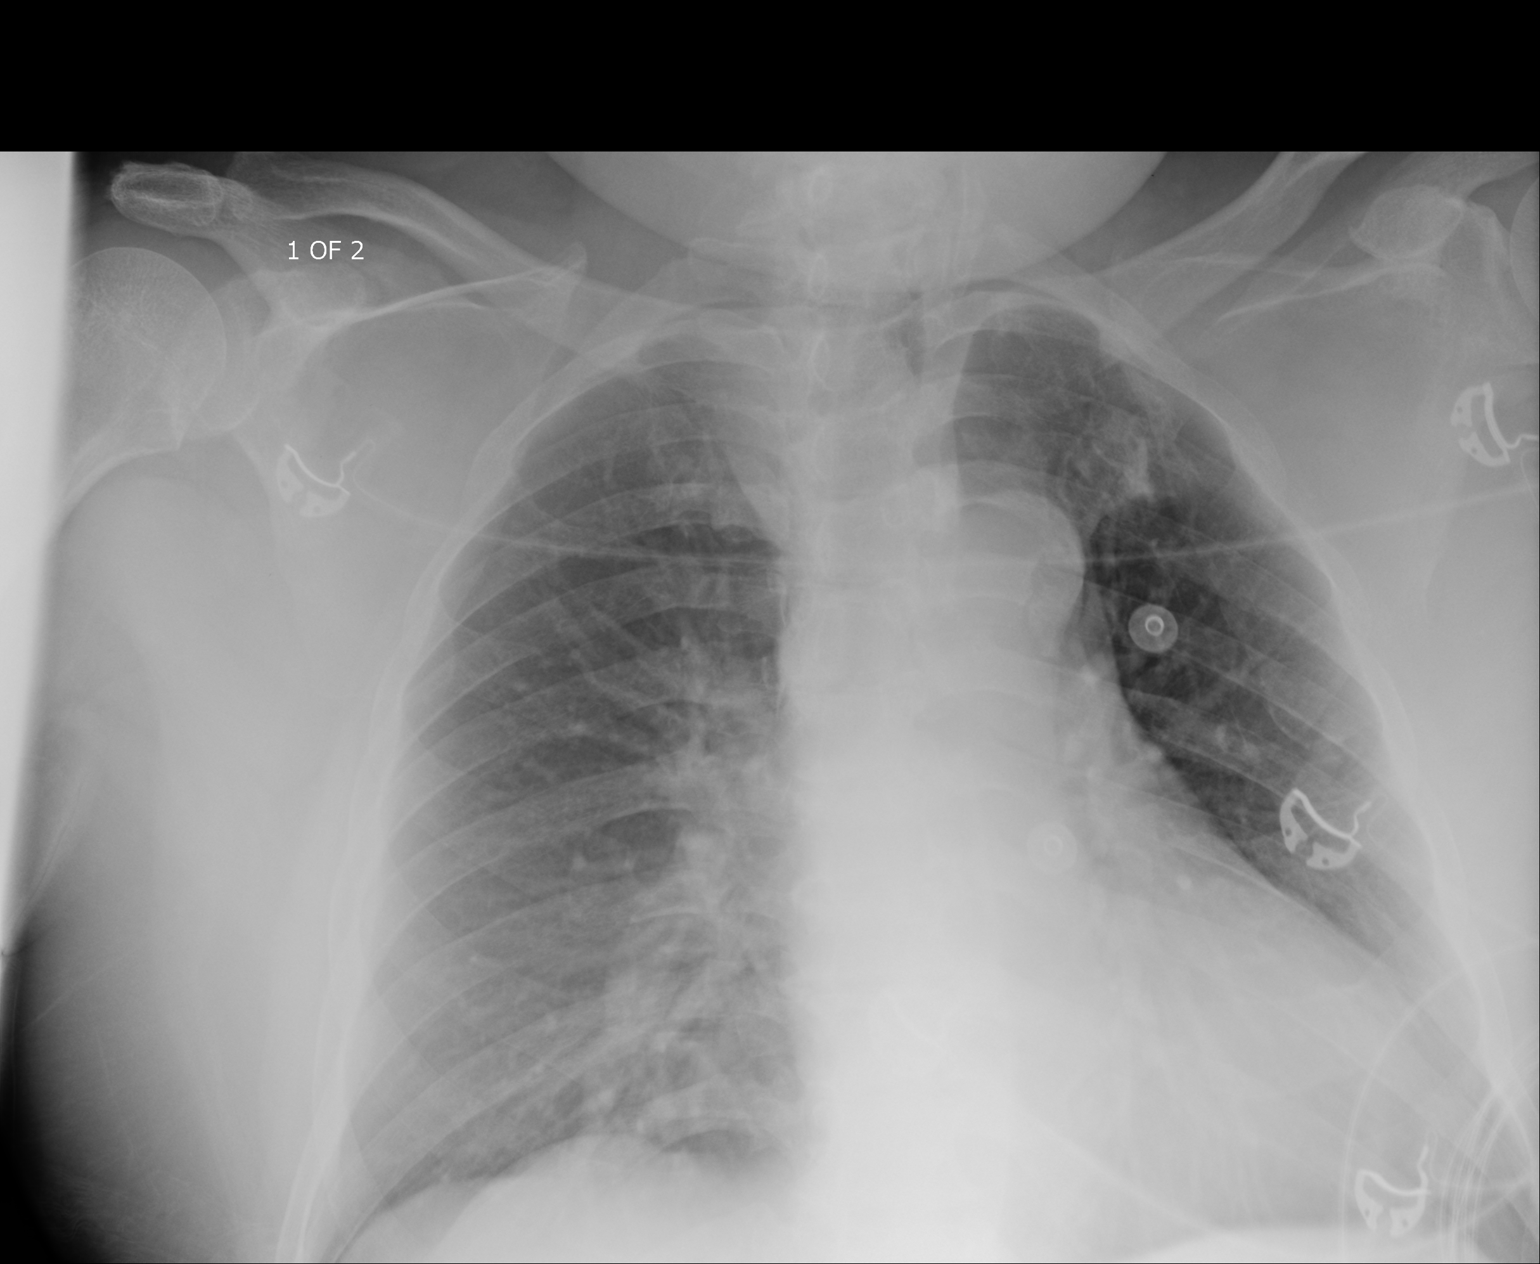
[im 2/2]
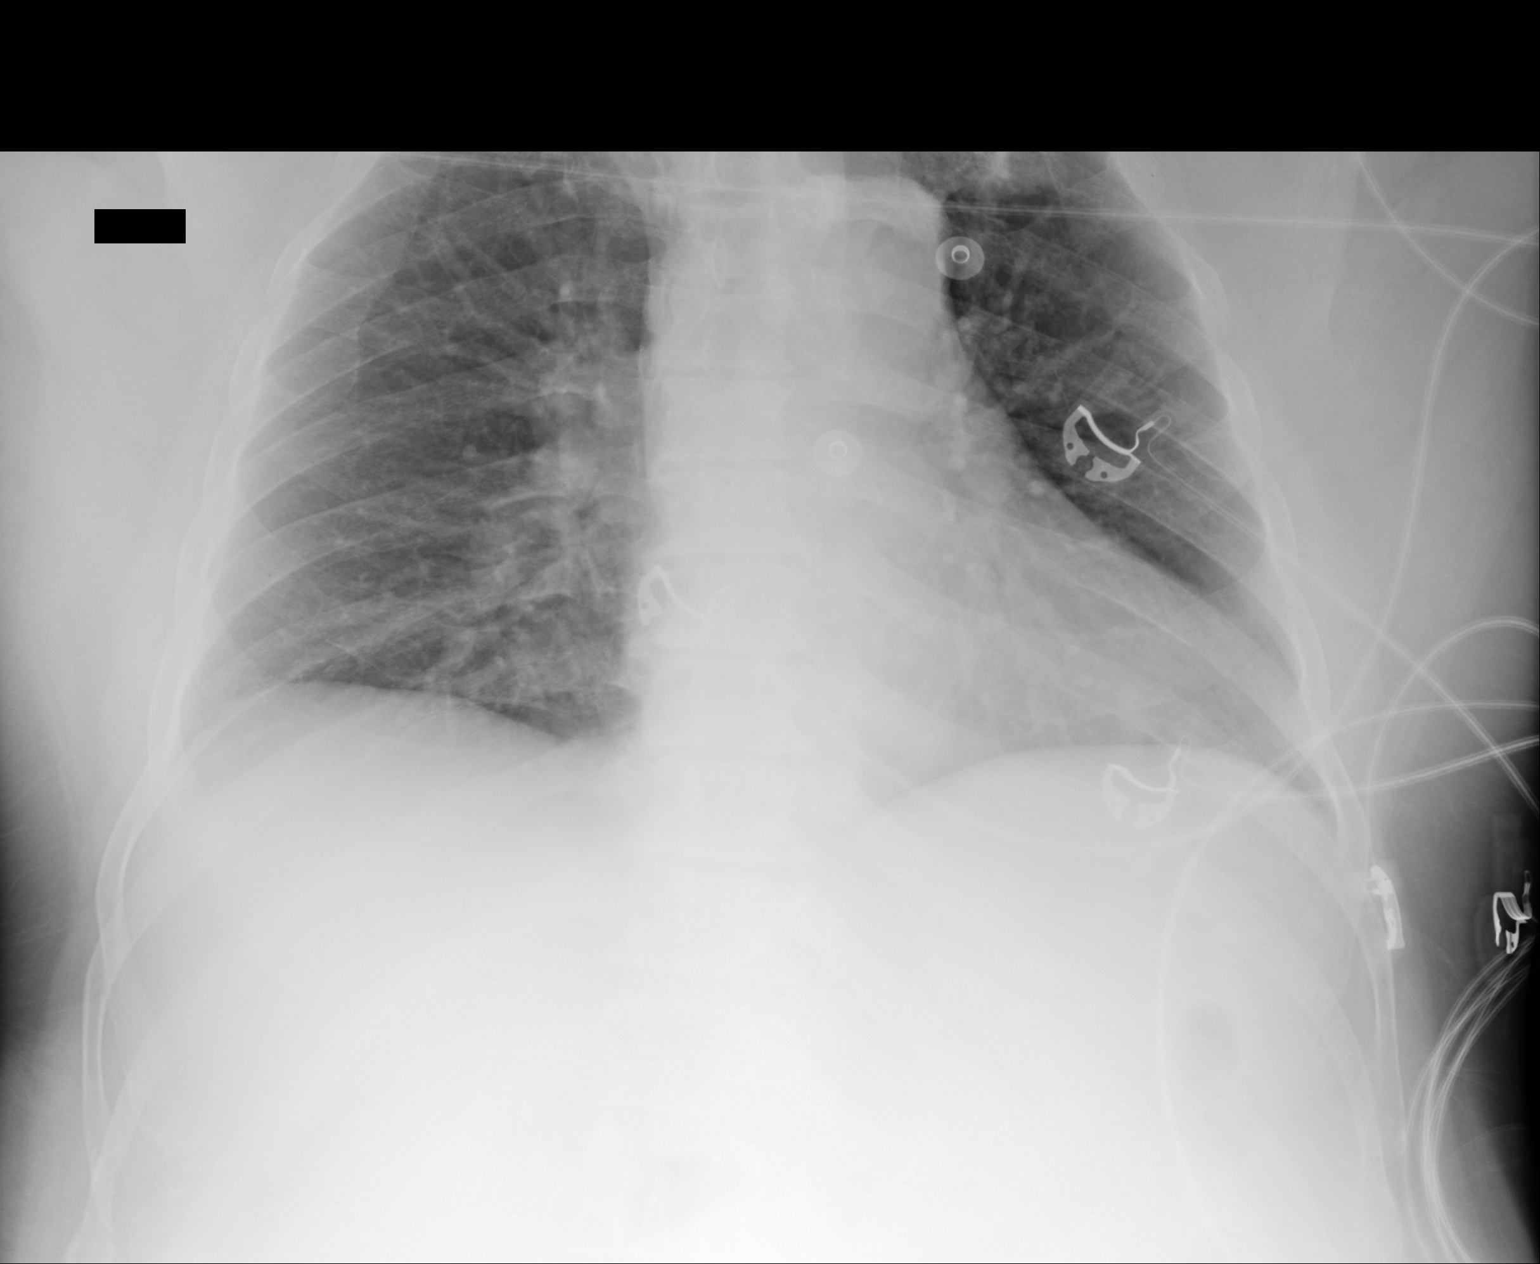

[2 of 2 positions shown; findings below may reference images not displayed]

FINDINGS: Lungs are somewhat hypoinflated without focal airspace consolidation
or effusion. Cardiomediastinal silhouette and remainder the exam is
unchanged.
IMPRESSION: No active disease.

## 2017-08-29 MED ORDER — ATORVASTATIN CALCIUM 40 MG PO TABS
80.0000 mg | ORAL_TABLET | Freq: Every day | ORAL | Status: DC
  Administered 2017-08-29: 80 mg via ORAL
  Filled 2017-08-29: qty 2

## 2017-08-29 MED ORDER — IOPAMIDOL (ISOVUE-370) INJECTION 76%
80.0000 mL | Freq: Once | INTRAVENOUS | Status: AC | PRN
  Administered 2017-08-29: 16:00:00 via INTRAVENOUS

## 2017-08-29 MED ORDER — ENOXAPARIN SODIUM 40 MG/0.4ML ~~LOC~~ SOLN
40.0000 mg | SUBCUTANEOUS | Status: DC
  Administered 2017-08-29: 40 mg via SUBCUTANEOUS
  Filled 2017-08-29: qty 0.4

## 2017-08-29 MED ORDER — ONDANSETRON HCL 4 MG/2ML IJ SOLN
4.0000 mg | Freq: Four times a day (QID) | INTRAMUSCULAR | Status: DC | PRN
  Administered 2017-08-29: 4 mg via INTRAVENOUS
  Filled 2017-08-29: qty 2

## 2017-08-29 MED ORDER — IPRATROPIUM-ALBUTEROL 0.5-2.5 (3) MG/3ML IN SOLN: 3.0000 mL | Freq: Four times a day (QID) | RESPIRATORY_TRACT | Status: DC | PRN

## 2017-08-29 MED ORDER — SODIUM CHLORIDE 0.9 % IV BOLUS (SEPSIS)
500.0000 mL | Freq: Once | INTRAVENOUS | Status: AC
  Administered 2017-08-29: 500 mL via INTRAVENOUS

## 2017-08-29 MED ORDER — ACETAMINOPHEN 650 MG RE SUPP: 650.0000 mg | Freq: Four times a day (QID) | RECTAL | Status: DC | PRN

## 2017-08-29 MED ORDER — LISINOPRIL 10 MG PO TABS
20.0000 mg | ORAL_TABLET | Freq: Every day | ORAL | Status: DC
  Administered 2017-08-29 – 2017-08-30 (×2): 20 mg via ORAL
  Filled 2017-08-29 (×2): qty 2

## 2017-08-29 MED ORDER — DILTIAZEM HCL ER COATED BEADS 240 MG PO CP24
240.0000 mg | ORAL_CAPSULE | Freq: Every day | ORAL | Status: DC
  Administered 2017-08-29: 240 mg via ORAL
  Filled 2017-08-29: qty 1

## 2017-08-29 MED ORDER — IPRATROPIUM-ALBUTEROL 0.5-2.5 (3) MG/3ML IN SOLN: 3.0000 mL | Freq: Four times a day (QID) | RESPIRATORY_TRACT | Status: DC

## 2017-08-29 MED ORDER — SODIUM CHLORIDE 0.9 % IV BOLUS (SEPSIS)
1000.0000 mL | Freq: Once | INTRAVENOUS | Status: AC
  Administered 2017-08-29: 1000 mL via INTRAVENOUS

## 2017-08-29 MED ORDER — TRAZODONE HCL 50 MG PO TABS: 25.0000 mg | ORAL_TABLET | Freq: Every evening | ORAL | Status: DC | PRN

## 2017-08-29 MED ORDER — PANTOPRAZOLE SODIUM 40 MG IV SOLR
40.0000 mg | INTRAVENOUS | Status: DC
Start: 2017-08-29 — End: 2017-08-30
  Administered 2017-08-29: 40 mg via INTRAVENOUS
  Filled 2017-08-29: qty 40

## 2017-08-29 MED ORDER — IPRATROPIUM-ALBUTEROL 0.5-2.5 (3) MG/3ML IN SOLN
RESPIRATORY_TRACT | Status: AC
  Filled 2017-08-29: qty 3

## 2017-08-29 MED ORDER — KETOROLAC TROMETHAMINE 15 MG/ML IJ SOLN
30.0000 mg | Freq: Four times a day (QID) | INTRAMUSCULAR | Status: DC | PRN
  Administered 2017-08-29: 30 mg via INTRAVENOUS
  Filled 2017-08-29: qty 2

## 2017-08-29 MED ORDER — ASPIRIN EC 81 MG PO TBEC
81.0000 mg | DELAYED_RELEASE_TABLET | Freq: Every day | ORAL | Status: DC
  Administered 2017-08-30: 81 mg via ORAL
  Filled 2017-08-29: qty 1

## 2017-08-29 MED ORDER — SODIUM CHLORIDE 0.9 % IV SOLN
INTRAVENOUS | Status: AC
  Administered 2017-08-29: 1000 mL via INTRAVENOUS
  Administered 2017-08-30 (×2): via INTRAVENOUS

## 2017-08-29 MED ORDER — KETOROLAC TROMETHAMINE 15 MG/ML IJ SOLN: 15.0000 mg | Freq: Four times a day (QID) | INTRAMUSCULAR | Status: DC | PRN

## 2017-08-29 MED ORDER — ACETAMINOPHEN 325 MG PO TABS
650.0000 mg | ORAL_TABLET | Freq: Once | ORAL | Status: AC
  Administered 2017-08-29: 650 mg via ORAL
  Filled 2017-08-29: qty 2

## 2017-08-29 MED ORDER — SENNOSIDES-DOCUSATE SODIUM 8.6-50 MG PO TABS
1.0000 | ORAL_TABLET | Freq: Every evening | ORAL | Status: DC | PRN
  Filled 2017-08-29: qty 1

## 2017-08-29 MED ORDER — ONDANSETRON HCL 4 MG PO TABS: 4.0000 mg | ORAL_TABLET | Freq: Four times a day (QID) | ORAL | Status: DC | PRN

## 2017-08-29 MED ORDER — ACETAMINOPHEN 325 MG PO TABS
650.0000 mg | ORAL_TABLET | Freq: Four times a day (QID) | ORAL | Status: DC | PRN
  Administered 2017-08-29: 650 mg via ORAL
  Filled 2017-08-29: qty 2

## 2017-08-29 NOTE — ED Notes (Signed)
CRITICAL VALUE ALERT  Critical Value:  Lactic 2.25  Date & Time Notied:  08/29/17 @ 0945  Provider Notified: Dr Jeanell Sparrow  Orders Received/Actions taken: no orders given at this time

## 2017-08-29 NOTE — ED Provider Notes (Signed)
[facility] EMERGENCY DEPARTMENT Provider Note   CSN: [id number] Arrival date & time: 08/29/17  7829     History   Chief Complaint Chief Complaint  Patient presents with  . Chest Pain    HPI Andrew Arnold is a 62 y.o. male.  HPI  62 y.o male complaining of sscp began last night about 630 p with associated nausea and chills f/b multiple episodes of loose stool.  Felt very lightheaded when he got up, no syncope.  Did not take temperature.  Multiple sick contacts, had flu shot pmd Dr. Karie Kirks Pain in epigastric area feels like something stuck and has been constant since last night. Some decrease with asa which he took after calling 911- called due to generalized weakness. Felt sweaty and diaphoretic   Past Medical History:  Diagnosis Date  . Allergic rhinitis due to pollen    on allergy shots  . Arrhythmia    1980s hospitalized for HTN and ?irregular rhythm but no specific dx but resolved during hospitalization  . HTN (hypertension)   . Hyperlipidemia     Patient Active Problem List   Diagnosis Date Noted  . Encounter for screening colonoscopy 02/01/2014  . Orthostatic hypotension 01/15/2014  . CAP (community acquired pneumonia) 11/12/2013  . HYPERLIPIDEMIA 08/14/2008  . HYPERTENSION 08/14/2008  . Seasonal and perennial allergic rhinitis 04/23/2007    Past Surgical History:  Procedure Laterality Date  . colonoscopy  2005   Dr. Gala Romney: normal rectum, sigmoid diverticula in colonic mucosa  . COLONOSCOPY N/A 02/15/2014   Procedure: COLONOSCOPY;  Surgeon: Daneil Dolin, MD;  [facility]: [facility];  Service: Endoscopy;  Laterality: N/A;  2:00       Home Medications    Prior to Admission medications   Medication Sig Start Date End Date Taking? Authorizing Provider  ascorbic acid (VITAMIN C) 500 MG tablet Take 500 mg by mouth every morning.     [provider]  aspirin 81 MG tablet Take 81 mg by mouth every morning.     [provider]    atorvastatin (LIPITOR) 80 MG tablet Take 80 mg by mouth at bedtime.     [provider]  B Complex Vitamins (B COMPLEX 100 PO) Take 1 tablet by mouth every morning.     [provider]  diltiazem (DILACOR XR) 240 MG 24 hr capsule Take 240 mg by mouth at bedtime.     [provider]  esomeprazole (NEXIUM) 20 MG capsule Take 20 mg by mouth daily as needed (for heart burn).    [provider]  lisinopril (PRINIVIL,ZESTRIL) 20 MG tablet Take 20 mg by mouth daily.    [provider]  NONFORMULARY OR COMPOUNDED ITEM Allergy Vaccine 1:10 Given at Home    [provider]  TAZTIA XT 240 MG 24 hr capsule  12/17/15   [provider]    Family History Family History  Problem Relation Age of Onset  . Cancer Mother   . Cirrhosis Father        cirrhosis of the liver; alcohol related  . Other Other        neice was on allergy vaccine when young  . Colon cancer Neg Hx     Social History Social History   Tobacco Use  . Smoking status: Never Smoker  . Smokeless tobacco: Never Used  Substance Use Topics  . Alcohol use: No  . Drug use: No     Allergies   Patient has no known  allergies.   Review of Systems Review of Systems  HENT: Positive for sneezing and sore throat.   Eyes: Negative.   Respiratory: Positive for chest tightness.   Cardiovascular: Positive for chest pain.  Gastrointestinal: Positive for abdominal pain, diarrhea and nausea.  Endocrine: Negative.   Genitourinary: Positive for dysuria.  Musculoskeletal: Negative.   Skin: Negative.   Allergic/Immunologic: Negative.   Neurological: Positive for light-headedness.  Hematological: Negative.   Psychiatric/Behavioral: Negative.   All other systems reviewed and are negative.    Physical Exam Updated Vital Signs BP (!) 147/81 (BP Location: Left Arm)   Pulse 90   Temp (!) 100.6 F (38.1 C) (Oral)   Resp 18   Ht 1.727 m (5\' 8" )   Wt 120.2 kg (265 lb)   SpO2  98%   BMI 40.29 kg/m   Physical Exam  Constitutional: He appears well-developed and well-nourished.  HENT:  Head: Normocephalic and atraumatic.  Eyes: EOM are normal. Pupils are equal, round, and reactive to light.  Neck: Normal range of motion. Neck supple.  Cardiovascular: Regular rhythm and normal pulses.  Pulmonary/Chest: Effort normal.  Abdominal: Soft. Bowel sounds are normal.  Musculoskeletal: Normal range of motion.       Right lower leg: Normal.       Left lower leg: Normal.  Skin: Skin is warm.  Nursing note and vitals reviewed.    ED Treatments / Results  Labs (all labs ordered are listed, but only abnormal results are displayed) Labs Reviewed  COMPREHENSIVE METABOLIC PANEL - Abnormal; Notable for the following components:      Result Value   Chloride 100 (*)    Glucose, Bld 130 (*)    BUN 27 (*)    All other components within normal limits  CBC WITH DIFFERENTIAL/PLATELET - Abnormal; Notable for the following components:   WBC 3.3 (*)    Lymphs Abs 0.4 (*)    All other components within normal limits  URINALYSIS, ROUTINE W REFLEX MICROSCOPIC - Abnormal; Notable for the following components:   Protein, ur 100 (*)    Squamous Epithelial / LPF 0-5 (*)    All other components within normal limits  I-STAT CG4 LACTIC ACID, ED - Abnormal; Notable for the following components:   Lactic Acid, Venous 2.25 (*)    All other components within normal limits  I-STAT CG4 LACTIC ACID, ED - Abnormal; Notable for the following components:   Lactic Acid, Venous 2.11 (*)    All other components within normal limits  CULTURE, BLOOD (ROUTINE X 2)  CULTURE, BLOOD (ROUTINE X 2)  TROPONIN I  INFLUENZA PANEL BY PCR (TYPE A & B)  D-DIMER, QUANTITATIVE (NOT AT [facility])    EKG  EKG Interpretation  Date/Time:  Sunday August 29 2017 08:43:16 EST Ventricular Rate:  88 PR Interval:    QRS Duration: 109 QT Interval:  358 QTC Calculation: 434 R Axis:   68 Text Interpretation:   Sinus rhythm Probable left atrial enlargement Abnormal T, consider ischemia, lateral leads Confirmed by Pattricia Boss [phone]) on 08/29/2017 10:10:51 AM       Radiology Dg Chest Port 1 View  Result Date: 08/29/2017 CLINICAL DATA:  Chest pain with syncopal episode and diarrhea. EXAM: PORTABLE CHEST 1 VIEW COMPARISON:  11/12/2013 FINDINGS: Lungs are somewhat hypoinflated without focal airspace consolidation or effusion. Cardiomediastinal silhouette and remainder the exam is unchanged. IMPRESSION: No active disease. Electronically Signed   By: Marin Olp M.D.   On: 08/29/2017 09:54    Procedures  Procedures (including critical care time)  Medications Ordered in ED Medications - No data to display   Initial Impression / Assessment and Plan / ED Course  I have reviewed the triage vital signs and the nursing notes.  Pertinent labs & imaging results that were available during my care of the patient were reviewed by me and considered in my medical decision making (see chart for details).     62 y.o male with retention hyperlipidemia presents today with generalized malaise, cough, and weakness.  He has associated substernal chest pain that is worse with coughing.  EKG without any acutely ischemic changes troponin normal.  Lactic acid is elevated.  He is received IV fluids and repeat lactic is decreasing.  Heart rate and blood pressure have remained stable.  Discussed with Dr. Wynetta Emery and will admit.  Flu negative.  Final Clinical Impressions(s) / ED Diagnoses   Final diagnoses:  Respiratory infection  Lactic acidosis  Chest pain, unspecified type    ED Discharge Orders    None       Pattricia Boss, MD 08/29/17 1329

## 2017-08-29 NOTE — ED Notes (Signed)
Iv attempt made x1, unsuccessful.

## 2017-08-29 NOTE — ED Triage Notes (Addendum)
Patient brought in via EMS. Alert and oriented. Airway patent. Patient c/o mid-sternal chest pain. Denies any radiation or shortness of breath. Patient does report nausea, vomiting, diarrhea, and fevers that started at the same time. Per EMS patient hypertensive with normal EKG. Patient given 4 baby aspirin by EMS paramedic.

## 2017-08-29 NOTE — H&P (Signed)
History and Physical  QUANG THORPE YIF:[id number] DOB: Dec 03, 1955 DOA: 08/29/2017  Referring physician: Jeanell Sparrow PCP: [name], MD   Chief Complaint: chest pain  HPI: Andrew Arnold is a 62 y.o. male with PMH detailed below presented to ED by EMS with complaints of weakness and diarrhea.  He also had some midsternal chest pain.  The patient reports that he has had some abdominal discomfort and diarrhea with vomiting and nausea for the past 2 days.  He has been generally very weak.  He has felt weak and dizzy with standing up from a seated position.  He came to the ED by EMS because of the diffuse diarrhea and malaise and weakness.  He denies having fever and chills.  He denies headache and shortness of breath.  He was seen in the ED and noted to be moderately dehydrated with orthostatic hypotension and elevated lactic acid.  No source of infection was found.  He had a normal chest x-ray.  His labs were within normal limits with the exception of an elevated lactic acid that was treated with IV fluid hydration.  The patient is being admitted with generalized weakness secondary to acute gastroenteritis most likely viral.  The patient denies having any known sick contacts. He was also noted to have an elevated D dimer of 6.11.    Review of Systems: All systems reviewed and apart from history of presenting illness, are negative.  Past Medical History:  Diagnosis Date  . Allergic rhinitis due to pollen    on allergy shots  . Arrhythmia    1980s hospitalized for HTN and ?irregular rhythm but no specific dx but resolved during hospitalization  . HTN (hypertension)   . Hyperlipidemia    Past Surgical History:  Procedure Laterality Date  . colonoscopy  2005   Dr. Gala Romney: normal rectum, sigmoid diverticula in colonic mucosa  . COLONOSCOPY N/A 02/15/2014   Procedure: COLONOSCOPY;  Surgeon: Daneil Dolin, MD;  Location: [facility];  Service: Endoscopy;  Laterality: N/A;  2:00   Social  History:  reports that  has never smoked. he has never used smokeless tobacco. He reports that he does not drink alcohol or use drugs.  No Known Allergies  Family History  Problem Relation Age of Onset  . Cancer Mother   . Cirrhosis Father        cirrhosis of the liver; alcohol related  . Other Other        neice was on allergy vaccine when young  . Colon cancer Neg Hx     Prior to Admission medications   Medication Sig Start Date End Date Taking? Authorizing Provider  ascorbic acid (VITAMIN C) 500 MG tablet Take 500 mg by mouth every morning.     [provider]  aspirin 81 MG tablet Take 81 mg by mouth every morning.     [provider]  atorvastatin (LIPITOR) 80 MG tablet Take 80 mg by mouth at bedtime.     [provider]  B Complex Vitamins (B COMPLEX 100 PO) Take 1 tablet by mouth every morning.     [provider]  diltiazem (DILACOR XR) 240 MG 24 hr capsule Take 240 mg by mouth at bedtime.     [provider]  DUEXIS 800-26.6 MG TABS Take 1 tablet by mouth 3 (three) times daily. 08/03/17   [provider]  esomeprazole (NEXIUM) 20 MG capsule Take 20 mg by mouth daily as needed (for heart burn).  [provider]  lisinopril (PRINIVIL,ZESTRIL) 20 MG tablet Take 20 mg by mouth daily.    [provider]  NONFORMULARY OR COMPOUNDED ITEM Allergy Vaccine 1:10 Given at Home    [provider]  potassium chloride (K-DUR) 10 MEQ tablet Take 1 tablet by mouth daily. 08/12/17   [provider]  TAZTIA XT 240 MG 24 hr capsule  12/17/15   [provider]   Physical Exam: Vitals:   08/29/17 0930 08/29/17 1000 08/29/17 1137 08/29/17 1252  BP: (!) 146/97 (!) 155/73 (!) 146/81 (!) 146/69  Pulse: 85 80 82 79  Resp: (!) 23 19 18 18   Temp:   100.2 F (37.9 C)   TempSrc:   Oral   SpO2: 98% 100% 100% 98%  Weight:      Height:         General exam: Moderately built and nourished patient,  lying comfortably supine on the gurney in no obvious distress.  Head, eyes and ENT: Nontraumatic and normocephalic. Pupils equally reacting to light and accommodation. Oral mucosa dry.  Neck: Supple. No JVD, carotid bruit or thyromegaly.  Lymphatics: No lymphadenopathy.  Respiratory system: Clear to auscultation. No increased work of breathing.  Cardiovascular system: S1 and S2 heard. No JVD, murmurs, gallops, clicks or pedal edema.  Gastrointestinal system: Abdomen is obese, nondistended, soft and nontender. Normal bowel sounds heard. No organomegaly or masses appreciated.  Central nervous system: Alert and oriented. No focal neurological deficits.  Extremities: Symmetric 5 x 5 power. Peripheral pulses symmetrically felt.   Skin: No rashes or acute findings.  Musculoskeletal system: Negative exam.  Psychiatry: Pleasant and cooperative.  Labs on Admission:  Basic Metabolic Panel: Recent Labs  Lab 08/29/17 0906  NA 138  K 3.6  CL 100*  CO2 26  GLUCOSE 130*  BUN 27*  CREATININE 1.21  CALCIUM 9.1   Liver Function Tests: Recent Labs  Lab 08/29/17 0906  AST 21  ALT 24  ALKPHOS 99  BILITOT 0.8  PROT 7.7  ALBUMIN 3.8   No results for input(s): LIPASE, AMYLASE in the last 168 hours. No results for input(s): AMMONIA in the last 168 hours. CBC: Recent Labs  Lab 08/29/17 0906  WBC 3.3*  NEUTROABS 2.6  HGB 13.7  HCT 41.9  MCV 95.2  PLT 202   Cardiac Enzymes: Recent Labs  Lab 08/29/17 0848 08/29/17 1353  TROPONINI <0.03 <0.03    BNP (last 3 results) No results for input(s): PROBNP in the last 8760 hours. CBG: No results for input(s): GLUCAP in the last 168 hours.  Radiological Exams on Admission: Dg Chest Port 1 View  Result Date: 08/29/2017 CLINICAL DATA:  Chest pain with syncopal episode and diarrhea. EXAM: PORTABLE CHEST 1 VIEW COMPARISON:  11/12/2013 FINDINGS: Lungs are somewhat hypoinflated without focal airspace consolidation or effusion.  Cardiomediastinal silhouette and remainder the exam is unchanged. IMPRESSION: No active disease. Electronically Signed   By: Marin Olp M.D.   On: 08/29/2017 09:54   EKG: Personally reviewed.  Normal sinus rhythm no acute ST-T wave abnormalities.  Assessment/Plan Principal Problem:   Acute gastroenteritis Active Problems:   Positive D dimer   Dyslipidemia   Essential hypertension   Chest pain   Fever   Acute URI   Moderate dehydration   Sepsis (HCC)   Elevated lactic acid level   Diarrhea   GERD (gastroesophageal reflux disease)  1. Acute gastroenteritis -admit for observation, supportive care, IV fluid hydration Tylenol for fever. 2. Chest pain-symptoms are atypical  but given the sudden onset of symptoms in addition to a positive d-dimer will check a CTA to rule out acute pulmonary embolus. 3. Fever-secondary to acute viral gastroenteritis-treating supportively. 4. Moderate dehydration treating with IV fluid hydration.  Fluid boluses given in the emergency department. 5. Elevated lactic acid-likely secondary to dehydration, treating with IV fluid hydration and will trend levels. 6. Diarrhea-secondary to acute viral gastroenteritis-treating supportively as noted above. 7. GERD-Protonix ordered for GI protection. 8. Hypertension-resuming home blood pressure medications and following. 9. Positive d-dimer-CTA chest ordered as above. 10. Low TSH - recheck outpatient when over the acute illness.    DVT Prophylaxis: Lovenox Code Status: Full Family Communication: No one present during rounds Disposition Plan: Home when medically stabilized  Time spent: 58 minutes  Irwin Brakeman, MD [facility] Pager [phone]  If 7PM-7AM, please contact night-coverage [email or web address] Password [facility] 08/29/2017, 3:07 PM

## 2017-08-30 ENCOUNTER — Observation Stay (HOSPITAL_COMMUNITY): Payer: BLUE CROSS/BLUE SHIELD

## 2017-08-30 DIAGNOSIS — R197 Diarrhea, unspecified: Secondary | ICD-10-CM | POA: Diagnosis not present

## 2017-08-30 DIAGNOSIS — R079 Chest pain, unspecified: Secondary | ICD-10-CM | POA: Diagnosis not present

## 2017-08-30 DIAGNOSIS — K529 Noninfective gastroenteritis and colitis, unspecified: Secondary | ICD-10-CM | POA: Diagnosis not present

## 2017-08-30 DIAGNOSIS — J069 Acute upper respiratory infection, unspecified: Secondary | ICD-10-CM

## 2017-08-30 LAB — COMPREHENSIVE METABOLIC PANEL
ALBUMIN: 3.1 g/dL — AB (ref 3.5–5.0)
ALT: 32 U/L (ref 17–63)
AST: 30 U/L (ref 15–41)
Alkaline Phosphatase: 73 U/L (ref 38–126)
Anion gap: 9 (ref 5–15)
BILIRUBIN TOTAL: 0.8 mg/dL (ref 0.3–1.2)
BUN: 25 mg/dL — AB (ref 6–20)
CO2: 28 mmol/L (ref 22–32)
Calcium: 8.1 mg/dL — ABNORMAL LOW (ref 8.9–10.3)
Chloride: 103 mmol/L (ref 101–111)
Creatinine, Ser: 1.19 mg/dL (ref 0.61–1.24)
GFR calc Af Amer: >60 mL/min (ref >60)
GFR calc non Af Amer: >60 mL/min (ref >60)
GLUCOSE: 129 mg/dL — AB (ref 65–99)
POTASSIUM: 3.5 mmol/L (ref 3.5–5.1)
Sodium: 140 mmol/L (ref 135–145)
TOTAL PROTEIN: 6.6 g/dL (ref 6.5–8.1)

## 2017-08-30 LAB — CBC WITH DIFFERENTIAL/PLATELET
Basophils Absolute: 0 10*3/uL (ref 0.0–0.1)
Basophils Relative: 0 %
Eosinophils Absolute: 0 10*3/uL (ref 0.0–0.7)
Eosinophils Relative: 0 %
HCT: 38.8 % — ABNORMAL LOW (ref 39.0–52.0)
Hemoglobin: 12.6 g/dL — ABNORMAL LOW (ref 13.0–17.0)
Lymphocytes Relative: 19 %
Lymphs Abs: 0.5 10*3/uL (ref 0.7–4.0)
MCH: 31.3 pg (ref 26.0–34.0)
MCHC: 32.5 g/dL (ref 30.0–36.0)
MCV: 96.5 fL (ref 78.0–100.0)
Monocytes Absolute: 0.3 10*3/uL (ref 0.1–1.0)
Monocytes Relative: 13 %
Neutro Abs: 1.8 10*3/uL (ref 1.7–7.7)
Neutrophils Relative %: 68 %
Platelets: 143 10*3/uL — ABNORMAL LOW (ref 150–400)
RBC: 4.02 MIL/uL — ABNORMAL LOW (ref 4.22–5.81)
RDW: 14.4 % (ref 11.5–15.5)
WBC: 2.7 10*3/uL — ABNORMAL LOW (ref 4.0–10.5)

## 2017-08-30 LAB — HEMOGLOBIN A1C
Hgb A1c MFr Bld: 6.4 % — ABNORMAL HIGH (ref 4.8–5.6)
Mean Plasma Glucose: 136.98 mg/dL

## 2017-08-30 LAB — HIV ANTIBODY (ROUTINE TESTING W REFLEX): HIV Screen 4th Generation wRfx: NONREACTIVE

## 2017-08-30 IMAGING — DX DG CHEST 2V
2 series · 2 of 2 positions shown · non-contrast
Comparison: [DATE]

CLINICAL DATA: Upper respiratory infection, sepsis

EXAM:
CHEST  2 VIEW

[chest pa]
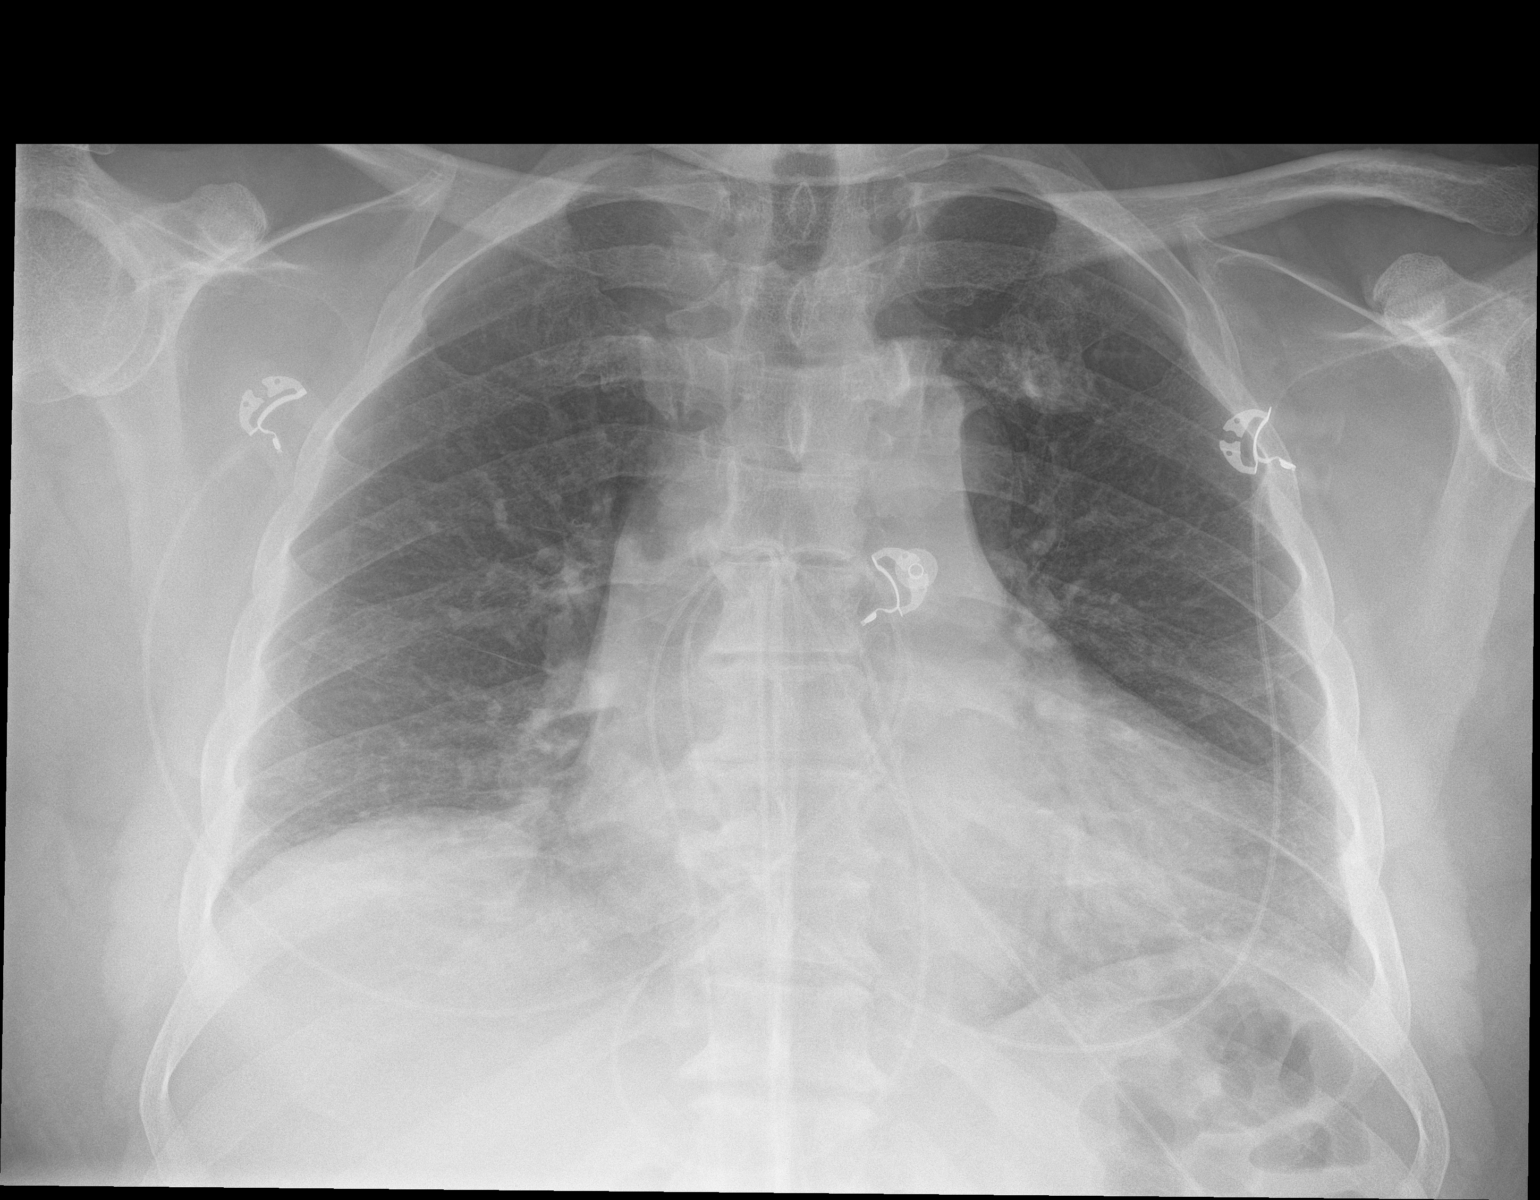

[chest lat]
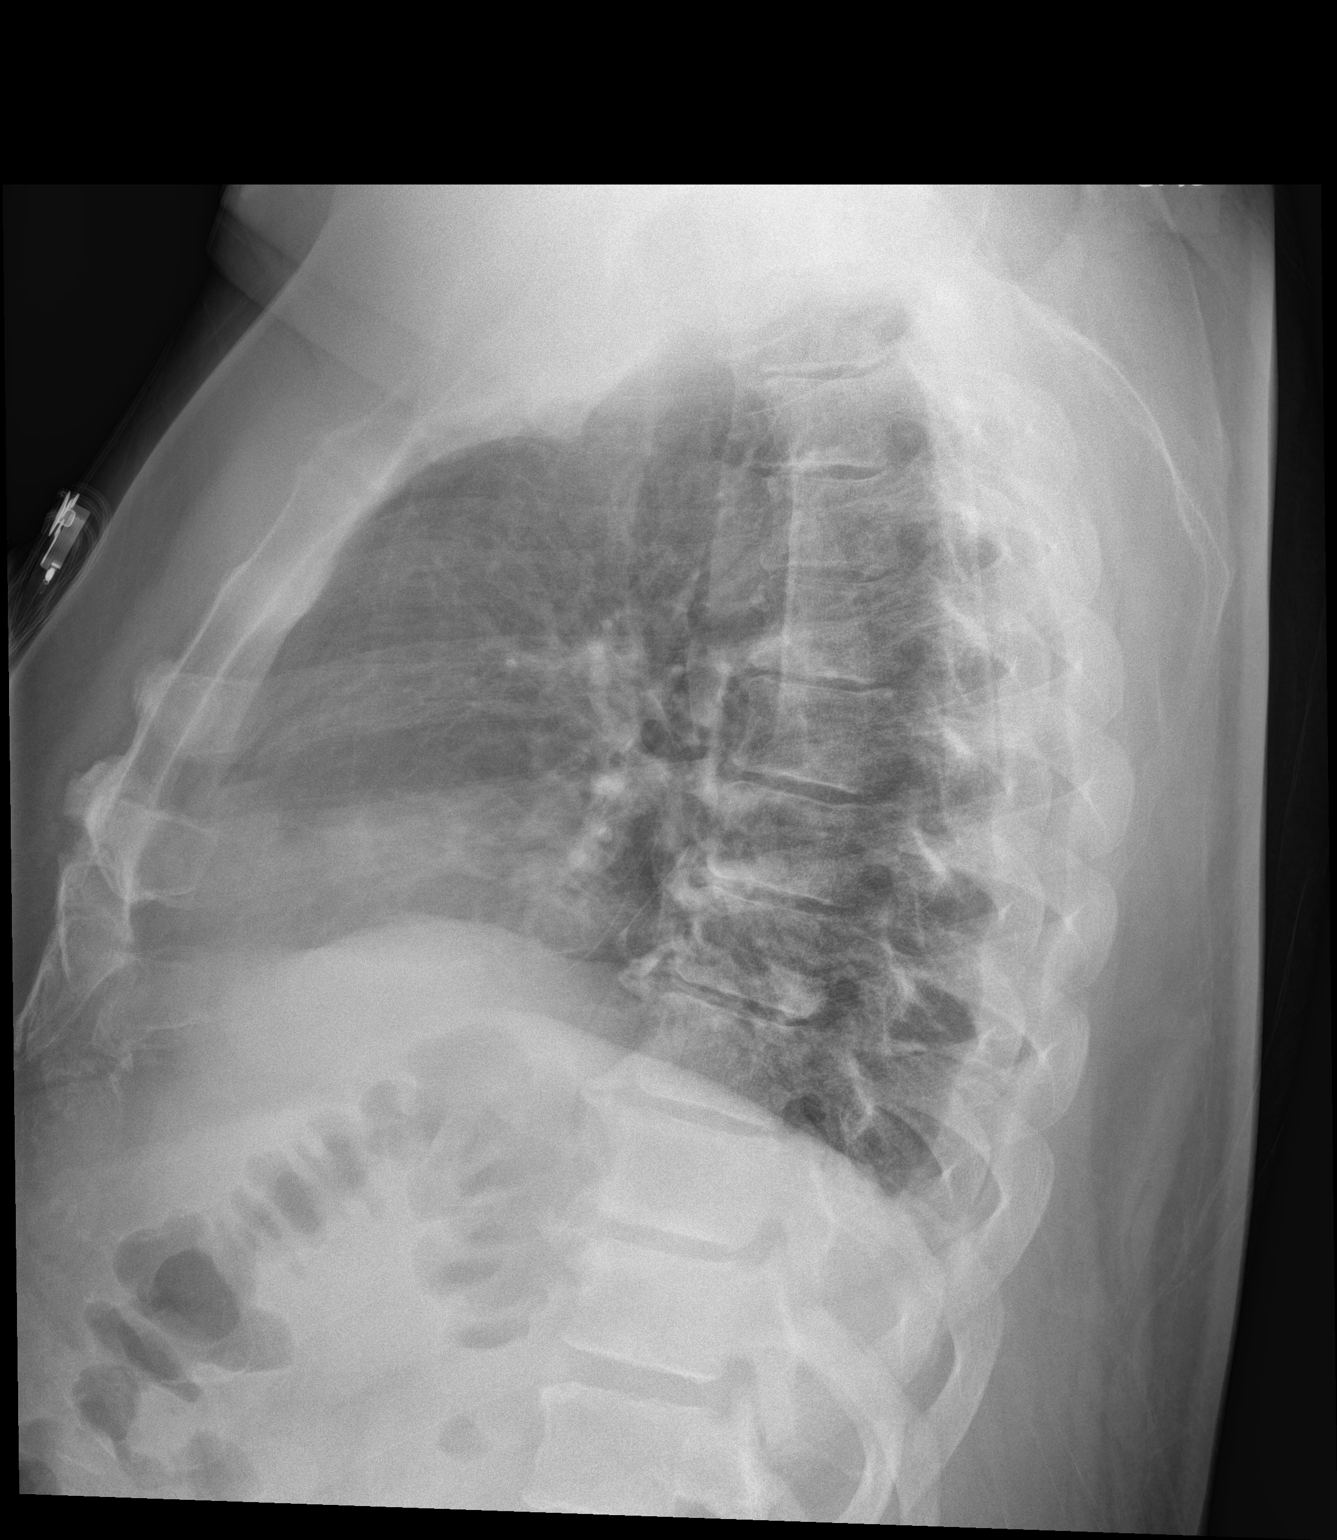

[2 of 2 positions shown; findings below may reference images not displayed]

FINDINGS: Cardiac enlargement without heart failure. Mild atelectasis in the
bases. Negative for pneumonia or effusion.
IMPRESSION: Mild bibasilar atelectasis.

## 2017-08-30 MED ORDER — ENOXAPARIN SODIUM 60 MG/0.6ML ~~LOC~~ SOLN: 60.0000 mg | SUBCUTANEOUS | Status: DC

## 2017-08-30 NOTE — Progress Notes (Signed)
Andrew Arnold discharged Home per MD order.  Discharge instructions reviewed and discussed with the patient, all questions and concerns answered. Copy of instructions and scripts given to patient.  Allergies as of 08/30/2017   No Known Allergies     Medication List    TAKE these medications   ascorbic acid 500 MG tablet Commonly known as:  VITAMIN C Take 500 mg by mouth every morning.   aspirin 81 MG tablet Take 81 mg by mouth every morning.   atorvastatin 80 MG tablet Commonly known as:  LIPITOR Take 80 mg by mouth at bedtime.   B COMPLEX 100 PO Take 1 tablet by mouth every morning.   diltiazem 240 MG 24 hr capsule Commonly known as:  DILACOR XR Take 240 mg by mouth at bedtime.   lisinopril 20 MG tablet Commonly known as:  PRINIVIL,ZESTRIL Take 20 mg by mouth daily.   potassium chloride 10 MEQ tablet Commonly known as:  K-DUR Take 1 tablet by mouth daily.       Patients skin is clean, dry and intact, no evidence of skin break down. IV site discontinued and catheter remains intact. Site without signs and symptoms of complications. Dressing and pressure applied.  Patient escorted to car in a wheelchair,  no distress noted upon discharge.  Ralene Muskrat Nicoles Sedlacek 08/30/2017 6:34 PM

## 2017-08-30 NOTE — Discharge Instructions (Signed)
Seek medical care or return if symptoms return, worsen or new problem develops Please eat a soft diet for next 10 days.  Drink plenty of extra fluids for next 7 days.    Follow with Primary MD  [name], MD  and other consultant's as instructed your Hospitalist MD  Please get a complete blood count and chemistry panel checked by your Primary MD at your next visit, and again as instructed by your Primary MD.  Get Medicines reviewed and adjusted: Please take all your medications with you for your next visit with your Primary MD  Laboratory/radiological data: Please request your Primary MD to go over all hospital tests and procedure/radiological results at the follow up, please ask your Primary MD to get all Hospital records sent to his/her office.  In some cases, they will be blood work, cultures and biopsy results pending at the time of your discharge. Please request that your primary care M.D. follows up on these results.  Also Note the following: If you experience worsening of your admission symptoms, develop shortness of breath, life threatening emergency, suicidal or homicidal thoughts you must seek medical attention immediately by calling 911 or calling your MD immediately  if symptoms less severe.  You must read complete instructions/literature along with all the possible adverse reactions/side effects for all the Medicines you take and that have been prescribed to you. Take any new Medicines after you have completely understood and accpet all the possible adverse reactions/side effects.   Do not drive when taking Pain medications or sleeping medications (Benzodaizepines)  Do not take more than prescribed Pain, Sleep and Anxiety Medications. It is not advisable to combine anxiety,sleep and pain medications without talking with your primary care practitioner  Special Instructions: If you have smoked or chewed Tobacco  in the last 2 yrs please stop smoking, stop any regular  Alcohol  and or any Recreational drug use.  Wear Seat belts while driving.  Please note: You were cared for by a hospitalist during your hospital stay. Once you are discharged, your primary care physician will handle any further medical issues. Please note that NO REFILLS for any discharge medications will be authorized once you are discharged, as it is imperative that you return to your primary care physician (or establish a relationship with a primary care physician if you do not have one) for your post hospital discharge needs so that they can reassess your need for medications and monitor your lab values.      Nonspecific Chest Pain Chest pain can be caused by many different conditions. There is a chance that your pain could be related to something serious, such as a heart attack or a blood clot in your lungs. Chest pain can also be caused by conditions that are not life-threatening. If you have chest pain, it is very important to follow up with your doctor. Follow these instructions at home: Medicines  If you were prescribed an antibiotic medicine, take it as told by your doctor. Do not stop taking the antibiotic even if you start to feel better.  Take over-the-counter and prescription medicines only as told by your doctor. Lifestyle  Do not use any products that contain nicotine or tobacco, such as cigarettes and e-cigarettes. If you need help quitting, ask your doctor.  Do not drink alcohol.  Make lifestyle changes as told by your doctor. These may include: ? Getting regular exercise. Ask your doctor for some activities that are safe for you. ? Eating a heart-healthy  diet. A diet specialist (dietitian) can help you to learn healthy eating options. ? Staying at a healthy weight. ? Managing diabetes, if needed. ? Lowering your stress, as with deep breathing or spending time in nature. General instructions  Avoid any activities that make you feel chest pain.  If your chest pain  is because of heartburn: ? Raise (elevate) the head of your bed about 6 inches (15 cm). You can do this by putting blocks under the bed legs at the head of the bed. ? Do not sleep with extra pillows under your head. That does not help heartburn.  Keep all follow-up visits as told by your doctor. This is important. This includes any further testing if your chest pain does not go away. Contact a doctor if:  Your chest pain does not go away.  You have a rash with blisters on your chest.  You have a fever.  You have chills. Get help right away if:  Your chest pain is worse.  You have a cough that gets worse, or you cough up blood.  You have very bad (severe) pain in your belly (abdomen).  You are very weak.  You pass out (faint).  You have either of these for no clear reason: ? Sudden chest discomfort. ? Sudden discomfort in your arms, back, neck, or jaw.  You have shortness of breath at any time.  You suddenly start to sweat, or your skin gets clammy.  You feel sick to your stomach (nauseous).  You throw up (vomit).  You suddenly feel light-headed or dizzy.  Your heart starts to beat fast, or it feels like it is skipping beats. These symptoms may be an emergency. Do not wait to see if the symptoms will go away. Get medical help right away. Call your local emergency services (911 in the [place] Do not drive yourself to the hospital. This information is not intended to replace advice given to you by your health care provider. Make sure you discuss any questions you have with your health care provider. Document Released: 12/16/2007 Document Revised: 03/23/2016 Document Reviewed: 03/23/2016 Elsevier Interactive Patient Education  2017 [employer].

## 2017-08-30 NOTE — Evaluation (Signed)
Physical Therapy Evaluation Patient Details Name: LAMIR RACCA MRN: [id number] DOB: 1955/08/20 Today's Date: 08/30/2017   History of Present Illness  Andrew Arnold is a 62 y.o. male with PMH detailed below presented to ED by EMS with complaints of weakness and diarrhea.  He also had some midsternal chest pain.  The patient reports that he has had some abdominal discomfort and diarrhea with vomiting and nausea for the past 2 days.  He has been generally very weak.  He has felt weak and dizzy with standing up from a seated position.  He came to the ED by EMS because of the diffuse diarrhea and malaise and weakness.  He denies having fever and chills.  He denies headache and shortness of breath.  He was seen in the ED and noted to be moderately dehydrated with orthostatic hypotension and elevated lactic acid.  No source of infection was found.  He had a normal chest x-ray.  His labs were within normal limits with the exception of an elevated lactic acid that was treated with IV fluid hydration.  The patient is being admitted with generalized weakness secondary to acute gastroenteritis most likely viral.  The patient denies having any known sick contacts. He was also noted to have an elevated D dimer of 6.11.      Clinical Impression  Patient functioning at baseline for functional mobility and gait.  Plan:  Patient discharged from physical therapy to care of nursing for ambulation as tolerated for length of stay.    Follow Up Recommendations No PT follow up    Equipment Recommendations  None recommended by PT    Recommendations for Other Services       Precautions / Restrictions Precautions Precautions: None Restrictions Weight Bearing Restrictions: No      Mobility  Bed Mobility Overal bed mobility: Independent                Transfers Overall transfer level: Independent                  Ambulation/Gait Ambulation/Gait assistance: Independent Ambulation  Distance (Feet): 150 Feet Assistive device: None Gait Pattern/deviations: WFL(Within Functional Limits)   Gait velocity interpretation: at or above normal speed for age/gender    Stairs            Wheelchair Mobility    Modified Rankin (Stroke Patients Only)       Balance Overall balance assessment: No apparent balance deficits (not formally assessed)                                           Pertinent Vitals/Pain Pain Assessment: 0-10 Pain Score: 5  Pain Location: stomach Pain Descriptors / Indicators: Aching;Discomfort Pain Intervention(s): Limited activity within patient's tolerance;Monitored during session    Home Living Family/patient expects to be discharged to:: Private residence Living Arrangements: Alone Available Help at Discharge: Family Type of Home: House Home Access: Stairs to enter Entrance Stairs-Rails: Right Entrance Stairs-Number of Steps: 3 Home Layout: One level Home Equipment: Cane - single point      Prior Function Level of Independence: Independent               Hand Dominance        Extremity/Trunk Assessment   Upper Extremity Assessment Upper Extremity Assessment: Overall WFL for tasks assessed    Lower Extremity Assessment Lower Extremity Assessment: Overall  WFL for tasks assessed    Cervical / Trunk Assessment Cervical / Trunk Assessment: Normal  Communication   Communication: No difficulties  Cognition Arousal/Alertness: Awake/alert Behavior During Therapy: WFL for tasks assessed/performed Overall Cognitive Status: Within Functional Limits for tasks assessed                                        General Comments      Exercises     Assessment/Plan    PT Assessment Patent does not need any further PT services  PT Problem List         PT Treatment Interventions      PT Goals (Current goals can be found in the Care Plan section)  Acute Rehab PT Goals Patient Stated  Goal: return home PT Goal Formulation: With patient Time For Goal Achievement: 05-Sep-2017 Potential to Achieve Goals: Good    Frequency     Barriers to discharge        Co-evaluation               AM-PAC PT "6 Clicks" Daily Activity  Outcome Measure Difficulty turning over in bed (including adjusting bedclothes, sheets and blankets)?: None Difficulty moving from lying on back to sitting on the side of the bed? : None Difficulty sitting down on and standing up from a chair with arms (e.g., wheelchair, bedside commode, etc,.)?: None Help needed moving to and from a bed to chair (including a wheelchair)?: None Help needed walking in hospital room?: None Help needed climbing 3-5 steps with a railing? : None 6 Click Score: 24    End of Session   Activity Tolerance: Patient tolerated treatment well Patient left: in chair(taken to X-ray by nursing staff) Nurse Communication: Mobility status PT Visit Diagnosis: Unsteadiness on feet (R26.81);Other abnormalities of gait and mobility (R26.89);Muscle weakness (generalized) (M62.81)    Time: 6226-3335 PT Time Calculation (min) (ACUTE ONLY): 14 min   Charges:   PT Evaluation $PT Eval Low Complexity: 1 Low PT Treatments $Gait Training: 8-22 mins   PT G Codes:        12:19 PM, 2017-09-05 Lonell Grandchild, MPT Physical Therapist with [facility] [phone] office [phone] mobile phone

## 2017-08-30 NOTE — Discharge Summary (Signed)
Physician Discharge Summary  Andrew Arnold QQV:[id number] DOB: October 08, 1955 DOA: 08/29/2017  PCP: Andrew Evens, MD  Admit date: 08/29/2017 Discharge date: 08/30/2017  Admitted From: Home  Disposition:  Home  Recommendations for Outpatient Follow-up:  1. Follow up with PCP in 1 weeks 2. Please obtain BMP/CBC in one week 3. Please follow up on the following pending results: final culture data  Discharge Condition: STABLE  CODE STATUS: FULL    Brief Hospitalization Summary: Please see all hospital notes, images, labs for full details of the hospitalization. HPI: Andrew Arnold is a 62 y.o. male with PMH detailed below presented to ED by EMS with complaints of weakness and diarrhea.  He also had some midsternal chest pain.  The patient reports that he has had some abdominal discomfort and diarrhea with vomiting and nausea for the past 2 days.  He has been generally very weak.  He has felt weak and dizzy with standing up from a seated position.  He came to the ED by EMS because of the diffuse diarrhea and malaise and weakness.  He denies having fever and chills.  He denies headache and shortness of breath.  He was seen in the ED and noted to be moderately dehydrated with orthostatic hypotension and elevated lactic acid.  No source of infection was found.  He had a normal chest x-ray.  His labs were within normal limits with the exception of an elevated lactic acid that was treated with IV fluid hydration.  The patient is being admitted with generalized weakness secondary to acute gastroenteritis most likely viral.  The patient denies having any known sick contacts. He was also noted to have an elevated D dimer of 6.11.    1. Acute gastroenteritis -admit for observation, supportive care, IV fluid hydration Tylenol for fever. 2. Chest pain-symptoms are atypical but given the sudden onset of symptoms in addition to a positive d-dimer will check a CTA to rule out acute pulmonary embolus.  The test  was negative for PE.   3. Fever-secondary to acute viral gastroenteritis-treating supportively. 4. Moderate dehydration treating with IV fluid hydration.  Fluid boluses given in the emergency department. 5. Elevated lactic acid-likely secondary to dehydration, treated with IV fluid hydration.   6. Diarrhea-secondary to acute viral gastroenteritis-treating supportively as noted above. His diarrhea has improved and he is only having loose stools now.  He feels stable enough to manage at home.   7. GERD-Protonix ordered for GI protection. 8. Hypertension-resuming home blood pressure medications and following. 9. Positive d-dimer-CTA chest ordered as above. 10. Low TSH - recheck outpatient when over the acute illness.    DVT Prophylaxis: Lovenox Code Status: Full Family Communication: No one present during rounds Disposition Plan: Home   Discharge Diagnoses:  Principal Problem:   Acute gastroenteritis Active Problems:   Positive D dimer   Dyslipidemia   Essential hypertension   Chest pain   Fever   Acute URI   Moderate dehydration   Sepsis (HCC)   Elevated lactic acid level   Diarrhea   GERD (gastroesophageal reflux disease)  Discharge Instructions: Discharge Instructions    Call MD for:  difficulty breathing, headache or visual disturbances   Complete by:  As directed    Call MD for:  extreme fatigue   Complete by:  As directed    Call MD for:  persistant dizziness or light-headedness   Complete by:  As directed    Call MD for:  persistant nausea and vomiting   Complete  by:  As directed    Increase activity slowly   Complete by:  As directed      Allergies as of 08/30/2017   No Known Allergies     Medication List    TAKE these medications   ascorbic acid 500 MG tablet Commonly known as:  VITAMIN C Take 500 mg by mouth every morning.   aspirin 81 MG tablet Take 81 mg by mouth every morning.   atorvastatin 80 MG tablet Commonly known as:  LIPITOR Take 80 mg by  mouth at bedtime.   B COMPLEX 100 PO Take 1 tablet by mouth every morning.   diltiazem 240 MG 24 hr capsule Commonly known as:  DILACOR XR Take 240 mg by mouth at bedtime.   lisinopril 20 MG tablet Commonly known as:  PRINIVIL,ZESTRIL Take 20 mg by mouth daily.   potassium chloride 10 MEQ tablet Commonly known as:  K-DUR Take 1 tablet by mouth daily.      Follow-up Information    Andrew Evens, MD. Schedule an appointment as soon as possible for a visit in 1 week(s).   Specialty:  Family Medicine Why:  Hospital follow-up appointment Contact information: [place] [address] [phone]          No Known Allergies Allergies as of 08/30/2017   No Known Allergies     Medication List    TAKE these medications   ascorbic acid 500 MG tablet Commonly known as:  VITAMIN C Take 500 mg by mouth every morning.   aspirin 81 MG tablet Take 81 mg by mouth every morning.   atorvastatin 80 MG tablet Commonly known as:  LIPITOR Take 80 mg by mouth at bedtime.   B COMPLEX 100 PO Take 1 tablet by mouth every morning.   diltiazem 240 MG 24 hr capsule Commonly known as:  DILACOR XR Take 240 mg by mouth at bedtime.   lisinopril 20 MG tablet Commonly known as:  PRINIVIL,ZESTRIL Take 20 mg by mouth daily.   potassium chloride 10 MEQ tablet Commonly known as:  K-DUR Take 1 tablet by mouth daily.      Procedures/Studies: X-ray Chest Pa And Lateral  Result Date: 08/30/2017 CLINICAL DATA:  Upper respiratory infection, sepsis EXAM: CHEST  2 VIEW COMPARISON:  08/29/2017 FINDINGS: Cardiac enlargement without heart failure. Mild atelectasis in the bases. Negative for pneumonia or effusion. IMPRESSION: Mild bibasilar atelectasis. Electronically Signed   By: Franchot Gallo M.D.   On: 08/30/2017 09:46   Ct Angio Chest Pe W Or Wo Contrast  Result Date: 08/29/2017 CLINICAL DATA:  Dyspnea.  Elevated D-dimer. EXAM: CT ANGIOGRAPHY CHEST WITH CONTRAST  TECHNIQUE: Multidetector CT imaging of the chest was performed using the standard protocol during bolus administration of intravenous contrast. Multiplanar CT image reconstructions and MIPs were obtained to evaluate the vascular anatomy. CONTRAST:  80 cc ISOVUE-370 IOPAMIDOL (ISOVUE-370) INJECTION 76% COMPARISON:  Chest radiograph from earlier today. FINDINGS: Cardiovascular: The study is moderate quality for the evaluation of pulmonary embolism, with some motion degradation limiting evaluation of the segmental and subsegmental branches. There are no convincing filling defects in the central, lobar, segmental or subsegmental pulmonary artery branches to suggest acute pulmonary embolism. Atherosclerotic nonaneurysmal thoracic aorta. Normal caliber pulmonary arteries. Top-normal heart size. No significant pericardial fluid/thickening. Mediastinum/Nodes: No discrete thyroid nodules. Unremarkable esophagus. No pathologically enlarged axillary, mediastinal or hilar lymph nodes. Lungs/Pleura: No pneumothorax. No pleural effusion. No acute consolidative airspace disease, lung masses or significant pulmonary nodules. Upper abdomen:  Small hiatal hernia. Musculoskeletal: No aggressive appearing focal osseous lesions. Marked thoracic spondylosis. Review of the MIP images confirms the above findings. IMPRESSION: 1. Motion degraded scan. No convincing evidence of pulmonary embolism. 2. No acute pulmonary disease. 3. Small hiatal hernia. Aortic Atherosclerosis (ICD10-I70.0). Electronically Signed   By: Ilona Sorrel M.D.   On: 08/29/2017 16:43   Dg Chest Port 1 View  Result Date: 08/29/2017 CLINICAL DATA:  Chest pain with syncopal episode and diarrhea. EXAM: PORTABLE CHEST 1 VIEW COMPARISON:  11/12/2013 FINDINGS: Lungs are somewhat hypoinflated without focal airspace consolidation or effusion. Cardiomediastinal silhouette and remainder the exam is unchanged. IMPRESSION: No active disease. Electronically Signed   By: Marin Olp M.D.   On: 08/29/2017 09:54      Subjective: Pt says that his diarrhea has slowed down considerably but still having loose stools and he ate breakfast and kept it down.   He is not vomiting.  He has no chest pain or SOB and no abdominal pain.  He says he feels well enough to go home today.    Discharge Exam: Vitals:   08/29/17 2102 08/30/17 0425  BP: 136/79 (!) 144/68  Pulse: 85 99  Resp: 18 18  Temp: (!) 100.4 F (38 C) 98.6 F (37 C)  SpO2: 97% 95%   Vitals:   08/29/17 1648 08/29/17 1724 08/29/17 2102 08/30/17 0425  BP:  (!) 146/58 136/79 (!) 144/68  Pulse:  91 85 99  Resp:  19 18 18   Temp: 99.8 F (37.7 C) (!) 101.2 F (38.4 C) (!) 100.4 F (38 C) 98.6 F (37 C)  TempSrc: Oral Oral Oral Oral  SpO2:  98% 97% 95%  Weight:  120.2 kg (264 lb 15.9 oz)    Height:  5\' 8"  (1.727 m)     General: Pt is alert, awake, not in acute distress Cardiovascular: RRR, S1/S2 +, no rubs, no gallops Respiratory: CTA bilaterally, no wheezing, no rhonchi Abdominal: Soft, NT, ND, bowel sounds + Extremities: no edema, no cyanosis   The results of significant diagnostics from this hospitalization (including imaging, microbiology, ancillary and laboratory) are listed below for reference.     Microbiology: Recent Results (from the past 240 hour(s))  Blood Culture (routine x 2)     Status: None (Preliminary result)   Collection Time: 08/29/17  9:14 AM  Result Value Ref Range Status   Specimen Description   Final    BLOOD RIGHT HAND BOTTLES DRAWN AEROBIC AND ANAEROBIC   Special Requests Blood Culture adequate volume  Final   Culture   Final    NO GROWTH < 24 HOURS Performed at [facility], [address] [place], [place] [address]    Report Status PENDING  Incomplete  Blood Culture (routine x 2)     Status: None (Preliminary result)   Collection Time: 08/29/17  9:15 AM  Result Value Ref Range Status   Specimen Description   Final    BLOOD LEFT HAND BOTTLES DRAWN AEROBIC AND  ANAEROBIC   Special Requests Blood Culture adequate volume  Final   Culture   Final    NO GROWTH < 24 HOURS Performed at [facility], [address], [place], [place] [address]    Report Status PENDING  Incomplete     Labs: BNP (last 3 results) No results for input(s): BNP in the last 8760 hours. Basic Metabolic Panel: Recent Labs  Lab 08/29/17 0906 08/30/17 0745  NA 138 140  K 3.6 3.5  CL 100* 103  CO2 26 28  GLUCOSE 130* 129*  BUN 27* 25*  CREATININE 1.21 1.19  CALCIUM 9.1 8.1*   Liver Function Tests: Recent Labs  Lab 08/29/17 0906 08/30/17 0745  AST 21 30  ALT 24 32  ALKPHOS 99 73  BILITOT 0.8 0.8  PROT 7.7 6.6  ALBUMIN 3.8 3.1*   No results for input(s): LIPASE, AMYLASE in the last 168 hours. No results for input(s): AMMONIA in the last 168 hours. CBC: Recent Labs  Lab 08/29/17 0906 08/30/17 0745  WBC 3.3* 2.7*  NEUTROABS 2.6 1.8  HGB 13.7 12.6*  HCT 41.9 38.8*  MCV 95.2 96.5  PLT 202 143*   Cardiac Enzymes: Recent Labs  Lab 08/29/17 0848 08/29/17 1353  TROPONINI <0.03 <0.03   BNP: Invalid input(s): POCBNP CBG: No results for input(s): GLUCAP in the last 168 hours. D-Dimer Recent Labs    08/29/17 1353  DDIMER 6.11*   Hgb A1c Recent Labs    08/29/17 1353  HGBA1C 6.4*   Lipid Profile No results for input(s): CHOL, HDL, LDLCALC, TRIG, CHOLHDL, LDLDIRECT in the last 72 hours. Thyroid function studies Recent Labs    08/29/17 1353  TSH 0.333*   Anemia work up No results for input(s): VITAMINB12, FOLATE, FERRITIN, TIBC, IRON, RETICCTPCT in the last 72 hours. Urinalysis    Component Value Date/Time   COLORURINE YELLOW 08/29/2017 1000   APPEARANCEUR CLEAR 08/29/2017 1000   LABSPEC 1.027 08/29/2017 1000   PHURINE 5.0 08/29/2017 1000   GLUCOSEU NEGATIVE 08/29/2017 1000   HGBUR NEGATIVE 08/29/2017 1000   BILIRUBINUR NEGATIVE 08/29/2017 1000   KETONESUR NEGATIVE 08/29/2017 1000   PROTEINUR 100 (A) 08/29/2017 1000   UROBILINOGEN  1.0 11/13/2013 0635   NITRITE NEGATIVE 08/29/2017 1000   LEUKOCYTESUR NEGATIVE 08/29/2017 1000   Sepsis Labs Invalid input(s): PROCALCITONIN,  WBC,  LACTICIDVEN Microbiology Recent Results (from the past 240 hour(s))  Blood Culture (routine x 2)     Status: None (Preliminary result)   Collection Time: 08/29/17  9:14 AM  Result Value Ref Range Status   Specimen Description   Final    BLOOD RIGHT HAND BOTTLES DRAWN AEROBIC AND ANAEROBIC   Special Requests Blood Culture adequate volume  Final   Culture   Final    NO GROWTH < 24 HOURS Performed at [facility], [address], [place], [place] [address]    Report Status PENDING  Incomplete  Blood Culture (routine x 2)     Status: None (Preliminary result)   Collection Time: 08/29/17  9:15 AM  Result Value Ref Range Status   Specimen Description   Final    BLOOD LEFT HAND BOTTLES DRAWN AEROBIC AND ANAEROBIC   Special Requests Blood Culture adequate volume  Final   Culture   Final    NO GROWTH < 24 HOURS Performed at [facility], [address] [place], [place] [address]    Report Status PENDING  Incomplete   Time coordinating discharge:   SIGNED:  Irwin Brakeman, MD  [facility] 08/30/2017, 12:06 PM Pager [phone]  If 7PM-7AM, please contact night-coverage [email or web address] Password [email or web address]

## 2017-09-03 LAB — CULTURE, BLOOD (ROUTINE X 2)
CULTURE: NO GROWTH
Culture: NO GROWTH
SPECIAL REQUESTS: ADEQUATE
Special Requests: ADEQUATE

## 2018-02-28 ENCOUNTER — Ambulatory Visit (INDEPENDENT_AMBULATORY_CARE_PROVIDER_SITE_OTHER): Payer: BLUE CROSS/BLUE SHIELD | Admitting: Allergy

## 2018-02-28 ENCOUNTER — Encounter: Payer: Self-pay | Admitting: Allergy

## 2018-02-28 ENCOUNTER — Ambulatory Visit: Payer: Self-pay | Admitting: Allergy

## 2018-02-28 VITALS — BP 148/72 | HR 65 | Resp 16 | Ht 68.0 in | Wt 267.0 lb

## 2018-02-28 DIAGNOSIS — L500 Allergic urticaria: Secondary | ICD-10-CM

## 2018-02-28 DIAGNOSIS — J301 Allergic rhinitis due to pollen: Secondary | ICD-10-CM

## 2018-02-28 NOTE — Patient Instructions (Addendum)
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  This rash does not appear to be contact dermatitis or atopic dermatitis.  - recommend taking long-acting antihistamine like Zyrtec 10mg , Allegra 180mg  or Xyzal 5mg  daily along with Zantac 150mg  or Pecid 20mg  twice a day.   Zantac and Pepcid are anti-reflux medications with antihistamine properties.   - If above are not effective enough will consider adding Singulair to regimen.  We also discussed Xolair monthly injections if oral medications do not suppress   Allergic rhinitis -  Environmental allergy skin testing today shows sensitivity to grasses, trees, mold (botrytis cinera), dust mites, dog, cockroach - allergen avoidance measures provided - antihistamine as above - allergen immunotherapy discussed today.  Informational handout provided.  If interested in this therapuetic option you can check with your insurance carrier for coverage.  Let us know if you would like to proceed with this option.     Follow-up 6 - 9 months or sooner if needed

## 2018-02-28 NOTE — Progress Notes (Signed)
New Patient Note  RE: Andrew Arnold MRN: [id number] DOB: [birth year] Date of Office Visit: 02/28/2018  Referring provider: Carlis Abbott, NP Primary care provider: [name], MD  Chief Complaint: rash  History of present illness: Andrew Arnold is a 62 y.o. male presenting today for consultation for rash.   He went to see his PCP and saw NP Ruthann Cancer after he developed an itchy rash about a month ago.  He states prior to the onset of the rash and itch he was cutting grass.  He states he believes he was bit by something as he had some local swelling on back of his neck without any systemic symptoms.  When the itch did not subside after several days he states he thought there could be something in his mattress pillows as he recalls being reactive to dust mites in the past.  He states he went out and bought mattress and pillow encasing and treated his mattress with alcohol and clorox to make sure he was not getting the rash from something in the mattress.  He denies seeing any bugs/bedbugs.  No one else lives in the household with him.  He does feel he is a bit less itchy with the encasings.  He has changed his soap to Dial white bar.   He has been using benadryl and benadryl cream but does not feel it has been helpful.  He also has been using the "green alcohol" on the areas that itch.  He was prescribed a 5 day prednisone taper but does not feel that it helped much.  The rash has been primarily on his arms, leg and abdomen.  He states on his abdomen the rash did look like "welts" otherwise reports having tiny bumps which he would scratch and break open the skin.  The rash and itch would worsen in the evening and when he gets overheated.  He states he also started wearing long sleeves when he needed to do any yard work. Denies any swelling, no joint aches/pains or fevers.  No new medications, no new foods, no change in soaps/lotions/detergents/body products prior to onset of rash.     He states he does start sneezing with freshly cut grass.  He was on allergen immunotherapy for about 10 years.  He stopped 2 years ago when Dr. Jenny Reichmann who was prescribing his allergy shots stop providing the service.  He was getting allergy shots weekly and does feel it was helpful while he was doing them.  He states he was allergic to almost everything on testing when it was done last.  No history of asthma or eczema.     Review of systems: Review of Systems  Constitutional: Negative for chills, fever and malaise/fatigue.  HENT: Positive for congestion. Negative for ear discharge, ear pain, nosebleeds and sore throat.   Eyes: Negative for pain, discharge and redness.  Respiratory: Negative for cough, shortness of breath and wheezing.   Cardiovascular: Negative for chest pain.  Gastrointestinal: Negative for abdominal pain, constipation, diarrhea, heartburn, nausea and vomiting.  Musculoskeletal: Negative for joint pain.  Skin: Positive for itching and rash.  Neurological: Negative for headaches.    All other systems negative unless noted above in HPI  Past medical history: Past Medical History:  Diagnosis Date  . Allergic rhinitis due to pollen    on allergy shots  . Arrhythmia    1980s hospitalized for HTN and ?irregular rhythm but no specific dx but resolved during hospitalization  . HTN (hypertension)   .  Hyperlipidemia     Past surgical history: Past Surgical History:  Procedure Laterality Date  . colonoscopy  2005   Dr. Gala Romney: normal rectum, sigmoid diverticula in colonic mucosa  . COLONOSCOPY N/A 02/15/2014   Procedure: COLONOSCOPY;  Surgeon: Daneil Dolin, MD;  Location: [facility];  Service: Endoscopy;  Laterality: N/A;  2:00    Family history:  Family History  Problem Relation Age of Onset  . Cancer Mother   . Cirrhosis Father        cirrhosis of the liver; alcohol related  . Other Other        neice was on allergy vaccine when young  . Colon cancer Neg Hx      Social history: He lives in a home with carpeting with electric heating and central cooling.  There are no pets in the home.  There is no concern for water damage, mildew or roaches in the home.  He is a Animal nutritionist.  Denies a smoking history.  Medication List: Allergies as of 02/28/2018   No Known Allergies     Medication List        Accurate as of 02/28/18  1:32 PM. Always use your most recent med list.          ascorbic acid 500 MG tablet Commonly known as:  VITAMIN C Take 500 mg by mouth every morning.   aspirin 81 MG tablet Take 81 mg by mouth every morning.   atorvastatin 80 MG tablet Commonly known as:  LIPITOR Take 80 mg by mouth at bedtime.   B COMPLEX 100 PO Take 1 tablet by mouth every morning.   diltiazem 240 MG 24 hr capsule Commonly known as:  DILACOR XR Take 240 mg by mouth at bedtime.   diphenhydrAMINE 25 MG tablet Commonly known as:  BENADRYL Take 25 mg by mouth every 6 (six) hours as needed.   hydrochlorothiazide 25 MG tablet Commonly known as:  HYDRODIURIL Take 25 mg by mouth daily.   lisinopril 20 MG tablet Commonly known as:  PRINIVIL,ZESTRIL Take 20 mg by mouth daily.   Magnesium 200 MG Tabs Take 1 tablet by mouth daily.   potassium chloride 10 MEQ tablet Commonly known as:  K-DUR Take 1 tablet by mouth daily.       Known medication allergies: No Known Allergies   Physical examination: Blood pressure (!) 148/72, pulse 65, resp. rate 16, height 5\' 8"  (1.727 m), weight 267 lb (121.1 kg), SpO2 98 %.  General: Alert, interactive, in no acute distress. HEENT: PERRLA, TMs pearly gray, turbinates minimally edematous without discharge, post-pharynx non erythematous. Neck: Supple without lymphadenopathy. Lungs: Clear to auscultation without wheezing, rhonchi or rales. {no increased work of breathing. CV: Normal S1, S2 without murmurs. Abdomen: Nondistended, nontender. Skin: Excoriated macules on the upper arms bilaterally.  No  urticarial lesions.  Skin is overall dry.. Extremities:  No clubbing, cyanosis or edema. Neuro:   Grossly intact.  Diagnositics/Labs:  Allergy testing: Environmental allergy skin prick testing is positive to Timothy grass, box elder, walnut, botrytis cinera, dust mites, cockroach.  Intradermal testing is positive to [facility], dog Allergy testing results were read and interpreted by provider, documented by clinical staff.   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  This rash does not appear to be contact dermatitis or atopic dermatitis or related to insect bites.  - recommend taking long-acting antihistamine like Zyrtec  10mg , Allegra 180mg  or Xyzal 5mg  daily along with Zantac 150mg  or Pecid 20mg  twice a day.   Zantac and Pepcid are anti-reflux medications with antihistamine properties.   - If above are not effective enough will consider adding Singulair to regimen.  We also discussed Xolair monthly injections if oral medications do not suppress   Allergic rhinitis -  Environmental allergy skin testing today shows sensitivity to grasses, trees, mold (botrytis cinera), dust mites, dog, cockroach - allergen avoidance measures provided - antihistamine as above - allergen immunotherapy discussed today.  Informational handout provided.  If interested in this therapuetic option you can check with your insurance carrier for coverage.  Let us know if you would like to proceed with this option.     Follow-up 6 - 9 months or sooner if needed  I appreciate the opportunity to take part in Daxon's care. Please do not hesitate to contact me with questions.  Sincerely,   Prudy Feeler, MD Allergy/Immunology [facility] [place]

## 2018-08-25 ENCOUNTER — Encounter: Payer: Self-pay | Admitting: Allergy

## 2018-08-25 ENCOUNTER — Ambulatory Visit (INDEPENDENT_AMBULATORY_CARE_PROVIDER_SITE_OTHER): Payer: BLUE CROSS/BLUE SHIELD | Admitting: Allergy

## 2018-08-25 VITALS — BP 162/86 | HR 74 | Resp 16 | Ht 69.0 in | Wt 275.0 lb

## 2018-08-25 DIAGNOSIS — L500 Allergic urticaria: Secondary | ICD-10-CM

## 2018-08-25 DIAGNOSIS — J301 Allergic rhinitis due to pollen: Secondary | ICD-10-CM | POA: Diagnosis not present

## 2018-08-25 NOTE — Progress Notes (Signed)
Follow-up Note  RE: Andrew Arnold MRN: [id number] DOB: Jun 28, 1956 Date of Office Visit: 08/25/2018   History of present illness:  Andrew Arnold is a 63 y.o. male presenting today for follow-up of allergic urticaria and allergic rhinitis. He was last seen in the office on 02/28/18 by myself.  Since this visit he has not had any major health changes, surgeries or hospitalizations.  He states he is getting over a sinus infection.  He is completing a course of Amoxicillin and took last dose this morning.  He states symptoms have improved as he was having sinus pain and that it was upper teeth pain.    He has been taking Cetirizine.  He states he was taking it about every day.  He states when he was taking every other day he would had drainage in his throat and a tickle.  He states he started taking it daily and the drainage and tickle has resolved.  Thus he now takes it nightly.     He states he has not had any further recurrences of the rash he had at his last visit.    Review of systems: Review of Systems  Constitutional: Negative for chills, fever and malaise/fatigue.  HENT: Positive for congestion and sinus pain. Negative for ear discharge, nosebleeds and sore throat.   Eyes: Negative for pain, discharge and redness.  Respiratory: Negative for cough, shortness of breath and wheezing.   Cardiovascular: Negative for chest pain.  Gastrointestinal: Negative for abdominal pain, constipation, diarrhea, heartburn, nausea and vomiting.  Musculoskeletal: Negative for joint pain.  Skin: Negative for itching and rash.  Neurological: Negative for headaches.    All other systems negative unless noted above in HPI  Past medical/social/surgical/family history have been reviewed and are unchanged unless specifically indicated below.  No changes  Medication List: Allergies as of 08/25/2018   No Known Allergies     Medication List       Accurate as of August 25, 2018  6:23 PM.  Always use your most recent med list.        ascorbic acid 500 MG tablet Commonly known as:  VITAMIN C Take 500 mg by mouth every morning.   aspirin 81 MG tablet Take 81 mg by mouth every morning.   atorvastatin 80 MG tablet Commonly known as:  LIPITOR Take 80 mg by mouth at bedtime.   B COMPLEX 100 PO Take 1 tablet by mouth every morning.   Cetirizine HCl 10 MG Caps Take 1 capsule by mouth daily as needed.   diltiazem 240 MG 24 hr capsule Commonly known as:  DILACOR XR Take 240 mg by mouth at bedtime.   diphenhydrAMINE 25 MG tablet Commonly known as:  BENADRYL Take 25 mg by mouth every 6 (six) hours as needed.   hydrochlorothiazide 25 MG tablet Commonly known as:  HYDRODIURIL Take 25 mg by mouth daily.   lisinopril 20 MG tablet Commonly known as:  PRINIVIL,ZESTRIL Take 20 mg by mouth daily.   Magnesium 200 MG Tabs Take 1 tablet by mouth daily.   potassium chloride 10 MEQ tablet Commonly known as:  K-DUR Take 1 tablet by mouth daily.       Known medication allergies: No Known Allergies   Physical examination: Blood pressure (!) 162/86, pulse 74, resp. rate 16, height 5\' 9"  (1.753 m), weight 275 lb (124.7 kg), SpO2 98 %.  General: Alert, interactive, in no acute distress. HEENT: PERRLA, TMs pearly gray, turbinates minimally edematous without discharge,  post-pharynx non erythematous. Neck: Supple without lymphadenopathy. Lungs: Clear to auscultation without wheezing, rhonchi or rales. {no increased work of breathing. CV: Normal S1, S2 without murmurs. Abdomen: Nondistended, nontender. Skin: Warm and dry, without lesions or rashes. Extremities:  No clubbing, cyanosis or edema. Neuro:   Grossly intact.  Diagnositics/Labs: None today  Assessment and plan:   Allergic rhinitis -  Continue avoidance measures for grasses, trees, mold (botrytis cinera), dust mites, dog, cockroach -  Continue Cetirizine 10mg  1 tablet daily at this time - allergen  immunotherapy (allergy shots) will be considered if medication management becomes ineffective.     Allergic urticaria - resolved without recurrence - if rash returns then recommend taking Cetirizine 1 tablet twice a day  - let us know if additional Cetirizine does not help and will recommend adding Pepcid to the regimen.     Follow-up 6 - 9 months or sooner if needed  I appreciate the opportunity to take part in Andrew Arnold's care. Please do not hesitate to contact me with questions.  Sincerely,   Prudy Feeler, MD Allergy/Immunology [facility] [place]

## 2018-08-25 NOTE — Patient Instructions (Addendum)
Allergic rhinitis -  Continue avoidance measures for grasses, trees, mold (botrytis cinera), dust mites, dog, cockroach -  Continue Cetirizine 10mg  1 tablet daily at this time - allergen immunotherapy (allergy shots) will be considered if medication management becomes ineffective.     Allergic urticaria - resolved without recurrence - if rash returns then recommend taking Cetirizine 1 tablet twice a day  - let us know if additional Cetirizine does not help and will recommend adding Pepcid to the regimen.       Follow-up 6 - 9 months or sooner if needed

## 2018-09-01 ENCOUNTER — Ambulatory Visit: Payer: BLUE CROSS/BLUE SHIELD | Admitting: Allergy

## 2019-02-09 ENCOUNTER — Other Ambulatory Visit: Payer: Self-pay

## 2019-02-09 ENCOUNTER — Encounter: Payer: Self-pay | Admitting: Allergy

## 2019-02-09 ENCOUNTER — Ambulatory Visit (INDEPENDENT_AMBULATORY_CARE_PROVIDER_SITE_OTHER): Payer: BC Managed Care – PPO | Admitting: Allergy

## 2019-02-09 VITALS — BP 132/96 | HR 81 | Temp 97.4°F | Resp 18 | Ht 67.0 in | Wt 272.7 lb

## 2019-02-09 DIAGNOSIS — L500 Allergic urticaria: Secondary | ICD-10-CM

## 2019-02-09 DIAGNOSIS — J301 Allergic rhinitis due to pollen: Secondary | ICD-10-CM

## 2019-02-09 NOTE — Patient Instructions (Signed)
Allergic rhinitis -  Continue avoidance measures for grasses, trees, mold, dust mites, dog, cockroach -  Continue generic antihistamine (Cetirizine) 10mg  1 tablet twice daily at this time -  Allergen immunotherapy (allergy shots) will be considered if medication management becomes ineffective.     Allergic urticaria - resolved without recurrence - let us know if additional Cetirizine does not help and recommend adding Pepcid 20mg  twice a day to the regimen.      Follow-up 12 months or sooner if needed

## 2019-02-09 NOTE — Progress Notes (Signed)
Follow-up Note  RE: Andrew Arnold MRN: [id number] DOB: 1955-12-08 Date of Office Visit: 02/09/2019   History of present illness: Andrew Arnold is a 63 y.o. male presenting today for follow-up of allergic rhinitis and allergic urticaria.  He was last seen in the office August 25, 2018 by myself.  He states he has been doing well other than he "popped his left shoulder" in March and has been seeing a rehab therapist and performing exercises to help strengthen the shoulder.  He states over the weekend he fell off of a ladder and reinjured the shoulder but otherwise he states he has not had any major health changes, surgeries or hospitalizations. He states he has not had any symptoms with his allergic rhinitis the spring and summer.  He states the Zyrtec was too expensive for him thus he has been taking generic cetirizine 10 mg and is taking 1 tablet twice a day which is controlling his symptoms quite well.  He also states he has not had any recurrence of hives since his last visit either.  Review of systems: Review of Systems  Constitutional: Negative for chills, fever and malaise/fatigue.  HENT: Negative for congestion, ear discharge, nosebleeds, sinus pain and sore throat.   Eyes: Negative for pain, discharge and redness.  Respiratory: Negative for cough, shortness of breath and wheezing.   Cardiovascular: Negative for chest pain.  Gastrointestinal: Negative for abdominal pain, constipation, diarrhea, heartburn, nausea and vomiting.  Musculoskeletal: Negative for joint pain.  Skin: Negative for itching and rash.  Neurological: Negative for headaches.    All other systems negative unless noted above in HPI  Past medical/social/surgical/family history have been reviewed and are unchanged unless specifically indicated below.  No changes  Medication List: Allergies as of 02/09/2019   No Known Allergies     Medication List       Accurate as of February 09, 2019 10:59 AM. If  you have any questions, ask your nurse or doctor.        ascorbic acid 500 MG tablet Commonly known as: VITAMIN C Take 500 mg by mouth every morning.   aspirin 81 MG tablet Take 81 mg by mouth every morning.   atorvastatin 80 MG tablet Commonly known as: LIPITOR Take 80 mg by mouth at bedtime.   B COMPLEX 100 PO Take 1 tablet by mouth every morning.   Cetirizine HCl 10 MG Caps Take 1 capsule by mouth 2 (two) times a day.   diltiazem 240 MG 24 hr capsule Commonly known as: DILACOR XR Take 240 mg by mouth at bedtime.   diphenhydrAMINE 25 MG tablet Commonly known as: BENADRYL Take 25 mg by mouth every 6 (six) hours as needed.   hydrochlorothiazide 25 MG tablet Commonly known as: HYDRODIURIL Take 25 mg by mouth daily.   HYDROcodone-acetaminophen 5-325 MG tablet Commonly known as: NORCO/VICODIN TK 1 T PO  Q 4 TO 6 HOURS PRF PAIN   lisinopril 20 MG tablet Commonly known as: ZESTRIL Take 20 mg by mouth daily.   Magnesium 200 MG Tabs Take 1 tablet by mouth daily.   potassium chloride 10 MEQ tablet Commonly known as: K-DUR Take 1 tablet by mouth daily.       Known medication allergies: No Known Allergies   Physical examination: Blood pressure (!) 132/96, pulse 81, temperature (!) 97.4 F (36.3 C), temperature source Temporal, resp. rate 18, height 5\' 7"  (1.702 m), weight 272 lb 11.3 oz (123.7 kg), SpO2 98 %.  General:  Alert, interactive, in no acute distress. HEENT: PERRLA, TMs pearly gray, turbinates non-edematous without discharge, post-pharynx non erythematous. Neck: Supple without lymphadenopathy. Lungs: Clear to auscultation without wheezing, rhonchi or rales. {no increased work of breathing. CV: Normal S1, S2 without murmurs. Abdomen: Nondistended, nontender. Skin: Warm and dry, without lesions or rashes. Extremities:  No clubbing, cyanosis or edema. Neuro:   Grossly intact.  Diagnositics/Labs: None today  Assessment and plan:   Allergic rhinitis  -  Continue avoidance measures for grasses, trees, mold, dust mites, dog, cockroach -  Continue generic antihistamine (Cetirizine) 10mg  1 tablet twice daily at this time -  Allergen immunotherapy (allergy shots) will be considered if medication management becomes ineffective.     Allergic urticaria - resolved without recurrence - let us know if additional Cetirizine does not help and recommend adding Pepcid 20mg  twice a day to the regimen.     Follow-up 12 months or sooner if needed  I appreciate the opportunity to take part in [place]'s care. Please do not hesitate to contact me with questions.  Sincerely,   Prudy Feeler, MD Allergy/Immunology [facility] [place]

## 2020-02-06 ENCOUNTER — Other Ambulatory Visit: Payer: Self-pay

## 2020-02-06 ENCOUNTER — Emergency Department (HOSPITAL_COMMUNITY)
Admission: EM | Admit: 2020-02-06 | Discharge: 2020-02-06 | Disposition: A | Discharge status: Home / Self Care | Payer: BC Managed Care – PPO | Attending: Emergency Medicine | Admitting: Emergency Medicine

## 2020-02-06 ENCOUNTER — Encounter (HOSPITAL_COMMUNITY): Payer: Self-pay | Admitting: Emergency Medicine

## 2020-02-06 DIAGNOSIS — Z79899 Other long term (current) drug therapy: Secondary | ICD-10-CM | POA: Insufficient documentation

## 2020-02-06 DIAGNOSIS — I10 Essential (primary) hypertension: Secondary | ICD-10-CM | POA: Insufficient documentation

## 2020-02-06 DIAGNOSIS — R6883 Chills (without fever): Secondary | ICD-10-CM | POA: Insufficient documentation

## 2020-02-06 DIAGNOSIS — D72819 Decreased white blood cell count, unspecified: Secondary | ICD-10-CM | POA: Insufficient documentation

## 2020-02-06 DIAGNOSIS — R1310 Dysphagia, unspecified: Secondary | ICD-10-CM | POA: Diagnosis not present

## 2020-02-06 LAB — CBC WITH DIFFERENTIAL/PLATELET
Abs Immature Granulocytes: 0 10*3/uL (ref 0.00–0.07)
Basophils Absolute: 0 10*3/uL (ref 0.0–0.1)
Basophils Relative: 0 %
Eosinophils Absolute: 0 10*3/uL (ref 0.0–0.5)
Eosinophils Relative: 2 %
HCT: 41 % (ref 39.0–52.0)
Hemoglobin: 13.3 g/dL (ref 13.0–17.0)
Immature Granulocytes: 0 %
Lymphocytes Relative: 41 %
Lymphs Abs: 0.9 10*3/uL (ref 0.7–4.0)
MCH: 31.1 pg (ref 26.0–34.0)
MCHC: 32.4 g/dL (ref 30.0–36.0)
MCV: 95.8 fL (ref 80.0–100.0)
Monocytes Absolute: 0.3 10*3/uL (ref 0.1–1.0)
Monocytes Relative: 15 %
Neutro Abs: 1 10*3/uL — ABNORMAL LOW (ref 1.7–7.7)
Neutrophils Relative %: 42 %
Platelets: 181 10*3/uL (ref 150–400)
RBC: 4.28 MIL/uL (ref 4.22–5.81)
RDW: 14 % (ref 11.5–15.5)
WBC: 2.3 10*3/uL — ABNORMAL LOW (ref 4.0–10.5)
nRBC: 0 % (ref 0.0–0.2)

## 2020-02-06 LAB — COMPREHENSIVE METABOLIC PANEL
ALT: 29 U/L (ref 0–44)
AST: 23 U/L (ref 15–41)
Albumin: 4 g/dL (ref 3.5–5.0)
Alkaline Phosphatase: 107 U/L (ref 38–126)
Anion gap: 8 (ref 5–15)
BUN: 20 mg/dL (ref 8–23)
CO2: 29 mmol/L (ref 22–32)
Calcium: 9.1 mg/dL (ref 8.9–10.3)
Chloride: 102 mmol/L (ref 98–111)
Creatinine, Ser: 1.06 mg/dL (ref 0.61–1.24)
GFR calc Af Amer: >60 mL/min (ref >60)
GFR calc non Af Amer: >60 mL/min (ref >60)
Glucose, Bld: 117 mg/dL — ABNORMAL HIGH (ref 70–99)
Potassium: 3.4 mmol/L — ABNORMAL LOW (ref 3.5–5.1)
Sodium: 139 mmol/L (ref 135–145)
Total Bilirubin: 0.8 mg/dL (ref 0.3–1.2)
Total Protein: 7.4 g/dL (ref 6.5–8.1)

## 2020-02-06 NOTE — Discharge Instructions (Addendum)
Please follow-up with your primary care provider in the next few days regarding today's encounter.  You were mildly hypokalemic (low potassium), I suspect due to your diminished appetite and food intake.  Please be sure to be eating your meals and drink plenty of fluids.  I recommend eating potatoes or bananas to help replenish your potassium.  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  I would like for you to follow-up with your primary care provider regarding these findings and you may ultimately benefit from further work-up or even hematology referral.  Dr. Johnsie Cancel is the cardiologist who you were speaking about - I agree that he is an excellent doctor.   Overall, your laboratory work-up, vital signs, and physical exam was reassuring.  Return to the ED or seek immediate medical attention should you experience any new or worsening symptoms.

## 2020-02-06 NOTE — ED Provider Notes (Signed)
[facility] EMERGENCY DEPARTMENT Provider Note   CSN: [id number] Arrival date & time: 02/06/20  1500     History Chief Complaint  Patient presents with  . Chills    Andrew Arnold is a 64 y.o. male with PMH of HTN, HLD, and allergic rhinitis who presents to the ED with a 1 day history of chills.  Patient reports that a couple of days ago he was eating chicken and he felt as though it got stuck in his throat.  However, he states that since then his symptoms of dysphagia has improved and he has been able to consume solid foods.  However, for the past day he states that he has had diminished appetite and significant chills.  He denies any and all other symptoms.  He states that his chills first came on when he was lounging in his underwear and then he had to get bundled up.  Since then he is also experienced intermittent episodes of feeling sweaty.  He denies any obvious fevers however and again denies any other symptoms that might have precipitated these chills.  He states that he has already received his COVID-19 vaccinations.  He suspects that perhaps he is experiencing chills because he is overworked.  He states that he has been running around taking care of family and work, which he believes may be what has provoked his symptoms.  HPI     Past Medical History:  Diagnosis Date  . Allergic rhinitis due to pollen    on allergy shots  . Arrhythmia    1980s hospitalized for HTN and ?irregular rhythm but no specific dx but resolved during hospitalization  . HTN (hypertension)   . Hyperlipidemia     Patient Active Problem List   Diagnosis Date Noted  . Chest pain 08/29/2017  . Fever 08/29/2017  . Acute URI 08/29/2017  . Moderate dehydration 08/29/2017  . Sepsis ([place]) 08/29/2017  . Elevated lactic acid level 08/29/2017  . Acute gastroenteritis 08/29/2017  . Diarrhea 08/29/2017  . GERD (gastroesophageal reflux disease) 08/29/2017  . Positive D dimer 08/29/2017  . Encounter for  screening colonoscopy 02/01/2014  . Orthostatic hypotension 01/15/2014  . Dyslipidemia 08/14/2008  . Essential hypertension 08/14/2008  . Seasonal and perennial allergic rhinitis 04/23/2007    Past Surgical History:  Procedure Laterality Date  . colonoscopy  2005   Dr. Gala Romney: normal rectum, sigmoid diverticula in colonic mucosa  . COLONOSCOPY N/A 02/15/2014   Procedure: COLONOSCOPY;  Surgeon: Daneil Dolin, MD;  Location: [facility];  Service: Endoscopy;  Laterality: N/A;  2:00       Family History  Problem Relation Age of Onset  . Cancer Mother   . Cirrhosis Father        cirrhosis of the liver; alcohol related  . Other Other        neice was on allergy vaccine when young  . Colon cancer Neg Hx     Social History   Tobacco Use  . Smoking status: Never Smoker  . Smokeless tobacco: Never Used  Vaping Use  . Vaping Use: Never used  Substance Use Topics  . Alcohol use: No  . Drug use: No    Home Medications Prior to Admission medications   Medication Sig Start Date End Date Taking? Authorizing Provider  ascorbic acid (VITAMIN C) 500 MG tablet Take 500 mg by mouth every morning.     [provider]  aspirin 81 MG tablet Take 81 mg by mouth every morning.  [provider]  atorvastatin (LIPITOR) 80 MG tablet Take 80 mg by mouth at bedtime.     [provider]  B Complex Vitamins (B COMPLEX 100 PO) Take 1 tablet by mouth every morning.     [provider]  Cetirizine HCl 10 MG CAPS Take 1 capsule by mouth 2 (two) times a day.     [provider]  diltiazem (DILACOR XR) 240 MG 24 hr capsule Take 240 mg by mouth at bedtime.     [provider]  diphenhydrAMINE (BENADRYL) 25 MG tablet Take 25 mg by mouth every 6 (six) hours as needed.    [provider]  hydrochlorothiazide (HYDRODIURIL) 25 MG tablet Take 25 mg by mouth daily.    [provider]  HYDROcodone-acetaminophen (NORCO/VICODIN) 5-325 MG  tablet TK 1 T PO  Q 4 TO 6 HOURS PRF PAIN 01/12/19   [provider]  lisinopril (PRINIVIL,ZESTRIL) 20 MG tablet Take 20 mg by mouth daily.    [provider]  Magnesium 200 MG TABS Take 1 tablet by mouth daily.    [provider]  potassium chloride (K-DUR) 10 MEQ tablet Take 1 tablet by mouth daily. 08/12/17   [provider]    Allergies    Patient has no known allergies.  Review of Systems   Review of Systems  All other systems reviewed and are negative.   Physical Exam Updated Vital Signs BP (!) 169/107 (BP Location: Right Arm)   Pulse 73   Temp 98.2 F (36.8 C) (Oral)   Resp 18   Ht 5\' 8"  (1.727 m)   Wt (!) 120.2 kg   SpO2 97%   BMI 40.29 kg/m   Physical Exam Vitals and nursing note reviewed. Exam conducted with a chaperone present.  Constitutional:      General: He is not in acute distress.    Appearance: Normal appearance. He is not ill-appearing.  HENT:     Head: Normocephalic and atraumatic.     Mouth/Throat:     Pharynx: Oropharynx is clear.  Eyes:     General: No scleral icterus.    Conjunctiva/sclera: Conjunctivae normal.  Cardiovascular:     Rate and Rhythm: Normal rate and regular rhythm.     Pulses: Normal pulses.     Heart sounds: Normal heart sounds.  Pulmonary:     Effort: Pulmonary effort is normal. No respiratory distress.     Breath sounds: Normal breath sounds. No wheezing or rales.  Abdominal:     General: Abdomen is flat. There is no distension.     Palpations: Abdomen is soft.     Tenderness: There is no abdominal tenderness.  Musculoskeletal:        General: Normal range of motion.     Cervical back: Normal range of motion. No rigidity.     Right lower leg: No edema.     Left lower leg: No edema.  Skin:    General: Skin is dry.     Capillary Refill: Capillary refill takes less than 2 seconds.     Findings: No rash.  Neurological:     Mental Status: He is alert and oriented to person, place, and  time.     GCS: GCS eye subscore is 4. GCS verbal subscore is 5. GCS motor subscore is 6.  Psychiatric:        Mood and Affect: Mood normal.        Behavior: Behavior normal.  Thought Content: Thought content normal.     ED Results / Procedures / Treatments   Labs (all labs ordered are listed, but only abnormal results are displayed) Labs Reviewed  CBC WITH DIFFERENTIAL/PLATELET - Abnormal; Notable for the following components:      Result Value   WBC 2.3 (*)    Neutro Abs 1.0 (*)    All other components within normal limits  COMPREHENSIVE METABOLIC PANEL - Abnormal; Notable for the following components:   Potassium 3.4 (*)    Glucose, Bld 117 (*)    All other components within normal limits    EKG None  Radiology No results found.  Procedures Procedures (including critical care time)  Medications Ordered in ED Medications - No data to display  ED Course  I have reviewed the triage vital signs and the nursing notes.  Pertinent labs & imaging results that were available during my care of the patient were reviewed by me and considered in my medical decision making (see chart for details).    MDM Rules/Calculators/A&P                          Patient presented to the ED for 1 day history of subjective chills and alternating sweats.  He denied any other symptoms aside from diminished appetite, however on subsequent evaluation admits that he typically will stop eating when he does not feel well.  He states that is nothing new or unusual denies any abdominal pain or nausea symptoms that may be contributing to the diminished appetite.  He has already received his COVID-19 vaccines and I do not feel as though repeat testing is warranted.  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  His platelet count is actually increased from his last labs obtained and I have lower suspicion for a developing pancytopenia or  aplastic anemia.  However, I would like for him to follow-up with his primary care provider to discuss possible myelodysplastic syndrome or other marrow disease.  He denies any other B symptoms and I have lower suspicion for any emergent pathology.  This is likely simply his baseline.  His CMP is entirely unremarkable.  He has a mild hypokalemia to 3.4, but he admits to diminished p.o. intake.  Encourage him to eat potatoes bananas and follow-up with his primary care provider for laboratory recheck to ensure correction of his derangement.  All of the evaluation and work-up results were discussed with the patient and any family at bedside.  Patient and/or family were informed that while patient is appropriate for discharge at this time, some medical emergencies may only develop or become detectable after a period of time.  I specifically instructed patient and/or family to return to return to the ED or seek immediate medical attention for any new or worsening symptoms.  They were provided opportunity to ask any additional questions and have none at this time.  Prior to discharge patient is feeling well, agreeable with plan for discharge home.  They have expressed understanding of verbal discharge instructions as well as return precautions and are agreeable to the plan.    Final Clinical Impression(s) / ED Diagnoses Final diagnoses:  Chills  Leukopenia, unspecified type    Rx / DC Orders ED Discharge Orders    None       Corena Herter, PA-C 02/06/20 2307    Hayden Rasmussen, MD 02/07/20 1216

## 2020-02-06 NOTE — ED Triage Notes (Signed)
Pt reports chills for last several days and low appetite. Pt reports was eating something Sunday night and it "got stuck". Pt reports denies any n/v/body aches or any complaints at this time. nad noted.

## 2020-02-09 ENCOUNTER — Ambulatory Visit (INDEPENDENT_AMBULATORY_CARE_PROVIDER_SITE_OTHER): Payer: BC Managed Care – PPO | Admitting: Allergy

## 2020-02-09 ENCOUNTER — Other Ambulatory Visit: Payer: Self-pay

## 2020-02-09 ENCOUNTER — Encounter: Payer: Self-pay | Admitting: Allergy

## 2020-02-09 VITALS — BP 158/72 | HR 91 | Resp 16 | Ht 69.0 in | Wt 273.2 lb

## 2020-02-09 DIAGNOSIS — L500 Allergic urticaria: Secondary | ICD-10-CM | POA: Diagnosis not present

## 2020-02-09 DIAGNOSIS — J301 Allergic rhinitis due to pollen: Secondary | ICD-10-CM

## 2020-02-09 NOTE — Progress Notes (Signed)
Follow-up Note  RE: Andrew Arnold MRN: [id number] DOB: 12-Feb-1956 Date of Office Visit: 02/09/2020   History of present illness: Andrew Arnold is a 64 y.o. male presenting today for follow-up of allergic rhinitis and urticaria.  He was last seen in the office on 02/09/19 by myself.   He has not had any recurrence of hives since last visit.  He also states his allergies are well controlled with zyrtec daily.   He did have a recent ED visit on 02/06/2020 for 1 day episode of chills.  He denies any other symptoms with the chills.  He is fully vaccinated Against COVID-19. Marland Kitchen  He had a normal exam in the ED .  He was found on CBC to have leukopenia however there was low suspicion for any blood cell dyscrasias.  It was advised that he follow-up with his PCP for this.  Review of systems: Review of Systems  Constitutional: Positive for chills. Negative for fever and malaise/fatigue.  HENT: Negative.   Eyes: Negative.   Respiratory: Negative.   Cardiovascular: Negative.   Gastrointestinal: Negative.   Musculoskeletal: Negative.   Skin: Negative.   Neurological: Negative.     All other systems negative unless noted above in HPI  Past medical/social/surgical/family history have been reviewed and are unchanged unless specifically indicated below.  No changes  Medication List: Current Outpatient Medications  Medication Sig Dispense Refill   ascorbic acid (VITAMIN C) 500 MG tablet Take 500 mg by mouth every morning.      aspirin 81 MG tablet Take 81 mg by mouth every morning.      atorvastatin (LIPITOR) 80 MG tablet Take 80 mg by mouth at bedtime.      B Complex Vitamins (B COMPLEX 100 PO) Take 1 tablet by mouth every morning.      Cetirizine HCl 10 MG CAPS Take 1 capsule by mouth 2 (two) times a day.      diltiazem (DILACOR XR) 240 MG 24 hr capsule Take 240 mg by mouth at bedtime.      diphenhydrAMINE (BENADRYL) 25 MG tablet Take 25 mg by mouth every 6 (six) hours as  needed.     hydrochlorothiazide (HYDRODIURIL) 25 MG tablet Take 25 mg by mouth daily.     HYDROcodone-acetaminophen (NORCO/VICODIN) 5-325 MG tablet TK 1 T PO  Q 4 TO 6 HOURS PRF PAIN     lisinopril (PRINIVIL,ZESTRIL) 20 MG tablet Take 20 mg by mouth daily.     Magnesium 200 MG TABS Take 1 tablet by mouth daily.     potassium chloride (K-DUR) 10 MEQ tablet Take 1 tablet by mouth daily.  1   No current facility-administered medications for this visit.     Known medication allergies: No Known Allergies   Physical examination: Blood pressure (!) 158/72, pulse 91, resp. rate 16, height 5\' 9"  (1.753 m), weight (!) 273 lb 3.2 oz (123.9 kg), SpO2 94 %.  General: Alert, interactive, in no acute distress. HEENT: PERRLA, TMs pearly gray, turbinates non-edematous without discharge, post-pharynx non erythematous. Neck: Supple without lymphadenopathy. Lungs: Clear to auscultation without wheezing, rhonchi or rales. {no increased work of breathing. CV: Normal S1, S2 without murmurs. Abdomen: Nondistended, nontender. Skin: Warm and dry, without lesions or rashes. Extremities:  No clubbing, cyanosis or edema. Neuro:   Grossly intact.  Diagnositics/Labs: None today  Assessment and plan: Patient Instructions  Allergic rhinitis -  Continue avoidance measures for grasses, trees, mold, dust mites, dog, cockroach -  Continue generic  antihistamine, Cetirizine 10mg  1 tablet twice daily at this time -  Allergen immunotherapy (allergy shots) will be considered if medication management becomes ineffective.     Allergic urticaria - resolved without recurrence - let us know if additional Cetirizine does not help and recommend adding Pepcid 20mg  twice a day to the regimen.      Follow-up 12 months or sooner if needed I appreciate the opportunity to take part in Andrew Arnold's care. Please do not hesitate to contact me with questions.  Sincerely,   Prudy Feeler, MD Allergy/Immunology Allergy and  [place] [facility] [place]

## 2020-02-09 NOTE — Patient Instructions (Addendum)
Allergic rhinitis -  Continue avoidance measures for grasses, trees, mold, dust mites, dog, cockroach -  Continue generic antihistamine, Cetirizine 10mg  1 tablet twice daily at this time -  Allergen immunotherapy (allergy shots) will be considered if medication management becomes ineffective.     Allergic urticaria - resolved without recurrence - let us know if additional Cetirizine does not help and recommend adding Pepcid 20mg  twice a day to the regimen.      Follow-up 12 months or sooner if needed

## 2020-12-10 DIAGNOSIS — B351 Tinea unguium: Secondary | ICD-10-CM | POA: Diagnosis not present

## 2021-01-07 DIAGNOSIS — E1165 Type 2 diabetes mellitus with hyperglycemia: Secondary | ICD-10-CM | POA: Diagnosis not present

## 2021-01-07 DIAGNOSIS — N401 Enlarged prostate with lower urinary tract symptoms: Secondary | ICD-10-CM | POA: Diagnosis not present

## 2021-01-07 DIAGNOSIS — I10 Essential (primary) hypertension: Secondary | ICD-10-CM | POA: Diagnosis not present

## 2021-01-08 DIAGNOSIS — E782 Mixed hyperlipidemia: Secondary | ICD-10-CM | POA: Diagnosis not present

## 2021-01-08 DIAGNOSIS — N401 Enlarged prostate with lower urinary tract symptoms: Secondary | ICD-10-CM | POA: Diagnosis not present

## 2021-01-08 DIAGNOSIS — E1165 Type 2 diabetes mellitus with hyperglycemia: Secondary | ICD-10-CM | POA: Diagnosis not present

## 2021-01-08 DIAGNOSIS — E7800 Pure hypercholesterolemia, unspecified: Secondary | ICD-10-CM | POA: Diagnosis not present

## 2021-01-08 DIAGNOSIS — I10 Essential (primary) hypertension: Secondary | ICD-10-CM | POA: Diagnosis not present

## 2021-01-20 DIAGNOSIS — B351 Tinea unguium: Secondary | ICD-10-CM | POA: Diagnosis not present

## 2021-01-22 DIAGNOSIS — Z125 Encounter for screening for malignant neoplasm of prostate: Secondary | ICD-10-CM | POA: Diagnosis not present

## 2021-01-22 DIAGNOSIS — E119 Type 2 diabetes mellitus without complications: Secondary | ICD-10-CM | POA: Diagnosis not present

## 2021-01-22 DIAGNOSIS — N401 Enlarged prostate with lower urinary tract symptoms: Secondary | ICD-10-CM | POA: Diagnosis not present

## 2021-01-22 DIAGNOSIS — E1165 Type 2 diabetes mellitus with hyperglycemia: Secondary | ICD-10-CM | POA: Diagnosis not present

## 2021-01-24 ENCOUNTER — Other Ambulatory Visit: Payer: Self-pay

## 2021-01-24 ENCOUNTER — Ambulatory Visit (INDEPENDENT_AMBULATORY_CARE_PROVIDER_SITE_OTHER): Payer: BC Managed Care – PPO | Admitting: Allergy

## 2021-01-24 ENCOUNTER — Encounter: Payer: Self-pay | Admitting: Allergy

## 2021-01-24 VITALS — BP 148/70 | HR 72 | Resp 16

## 2021-01-24 DIAGNOSIS — J301 Allergic rhinitis due to pollen: Secondary | ICD-10-CM

## 2021-01-24 DIAGNOSIS — L500 Allergic urticaria: Secondary | ICD-10-CM

## 2021-01-24 NOTE — Patient Instructions (Addendum)
Allergic rhinitis -  Continue avoidance measures for grasses, trees, mold, dust mites, dog, cockroach -  Continue generic antihistamine, Cetirizine 10mg  1 tablet 1-2 times a day -  Allergen immunotherapy (allergy shots) will be considered if medication management becomes ineffective.     Allergic urticaria - resolved without recurrence - let us know if additional Cetirizine does not help and recommend adding Pepcid 20mg  twice a day to the regimen.      Follow-up 12 months or sooner if needed

## 2021-01-24 NOTE — Progress Notes (Signed)
Follow-up Note  RE: Andrew Arnold MRN: [id number] DOB: March 11, 1956 Date of Office Visit: 01/24/2021   History of present illness: Andrew Arnold is a 65 y.o. male presenting today for follow-up of allergic rhinitis.  He also has history of allergic urticaria. He was last seen in the office on 02/09/20 by myself.  He has done well over the past year without any major health changes, surgeries or hospitalizations.  He states he has been staying busy with helping family members with tasks. Regards to his allergic rhinitis he states he may have a bit more symptoms of sneezing, congestion and itchy watery eyes at the beginning of the pollen season but this typically subsides pretty quickly and he is well managed from cetirizine once a day. He states he has not had any further hive episodes.  However he did state he recently was treated for foot fungus in the affected foot he has had episodic itching on the top of the foot.  He has not noted frank hives however.  He also states he has been having some pins and needlelike sensation of his lower extremity and arms.  He did discuss this with the podiatrist for foot fungus who recommended he see a neurologist.  He has a neurology appointment for next week.  Review of systems: Review of Systems  Constitutional: Negative.   HENT: Negative.    Eyes: Negative.   Respiratory: Negative.    Cardiovascular: Negative.   Gastrointestinal: Negative.   Musculoskeletal: Negative.   Skin:  Positive for itching. Negative for rash.  Neurological: Negative.    All other systems negative unless noted above in HPI  Past medical/social/surgical/family history have been reviewed and are unchanged unless specifically indicated below.  No changes  Medication List: Current Outpatient Medications  Medication Sig Dispense Refill   ascorbic acid (VITAMIN C) 500 MG tablet Take 500 mg by mouth every morning.      aspirin 81 MG tablet Take 81 mg by mouth every  morning.      atorvastatin (LIPITOR) 80 MG tablet Take 80 mg by mouth at bedtime.      B Complex Vitamins (B COMPLEX 100 PO) Take 1 tablet by mouth every morning.      Cetirizine HCl 10 MG CAPS Take 1 capsule by mouth 2 (two) times a day.      diltiazem (DILACOR XR) 240 MG 24 hr capsule Take 240 mg by mouth at bedtime.      diphenhydrAMINE (BENADRYL) 25 MG tablet Take 25 mg by mouth every 6 (six) hours as needed.     hydrochlorothiazide (HYDRODIURIL) 25 MG tablet Take 25 mg by mouth daily.     lisinopril (PRINIVIL,ZESTRIL) 20 MG tablet Take 20 mg by mouth daily.     Magnesium 200 MG TABS Take 1 tablet by mouth daily.     potassium chloride (K-DUR) 10 MEQ tablet Take 1 tablet by mouth daily.  1   No current facility-administered medications for this visit.     Known medication allergies: No Known Allergies   Physical examination: Blood pressure (!) 148/70, pulse 72, resp. rate 16.  General: Alert, interactive, in no acute distress. HEENT: PERRLA, TMs pearly gray, turbinates non-edematous without discharge, post-pharynx non erythematous. Neck: Supple without lymphadenopathy. Lungs: Clear to auscultation without wheezing, rhonchi or rales. {no increased work of breathing. CV: Normal S1, S2 without murmurs. Abdomen: Nondistended, nontender. Skin: Warm and dry, without lesions or rashes. Extremities:  No clubbing, cyanosis or edema. Neuro:  Grossly intact.  Diagnositics/Labs: None today  Assessment and plan:   Allergic rhinitis -  Continue avoidance measures for grasses, trees, mold, dust mites, dog, cockroach -  Continue generic antihistamine, Cetirizine 10mg  1 tablet 1-2 times a day -  Allergen immunotherapy (allergy shots) will be considered if medication management becomes ineffective.     Allergic urticaria - resolved without recurrence - let us know if additional Cetirizine does not help and recommend adding Pepcid 20mg  twice a day to the regimen.      Follow-up 12  months or sooner if needed  I appreciate the opportunity to take part in Andrew Arnold's care. Please do not hesitate to contact me with questions.  Sincerely,   Prudy Feeler, MD Allergy/Immunology Allergy and [place] [facility] [place]

## 2021-01-28 DIAGNOSIS — D529 Folate deficiency anemia, unspecified: Secondary | ICD-10-CM | POA: Diagnosis not present

## 2021-01-28 DIAGNOSIS — R7301 Impaired fasting glucose: Secondary | ICD-10-CM | POA: Diagnosis not present

## 2021-01-28 DIAGNOSIS — R29898 Other symptoms and signs involving the musculoskeletal system: Secondary | ICD-10-CM | POA: Diagnosis not present

## 2021-01-28 DIAGNOSIS — M79671 Pain in right foot: Secondary | ICD-10-CM | POA: Diagnosis not present

## 2021-01-28 DIAGNOSIS — G5603 Carpal tunnel syndrome, bilateral upper limbs: Secondary | ICD-10-CM | POA: Diagnosis not present

## 2021-01-28 DIAGNOSIS — E538 Deficiency of other specified B group vitamins: Secondary | ICD-10-CM | POA: Diagnosis not present

## 2021-01-28 DIAGNOSIS — M5417 Radiculopathy, lumbosacral region: Secondary | ICD-10-CM | POA: Diagnosis not present

## 2021-01-28 DIAGNOSIS — G603 Idiopathic progressive neuropathy: Secondary | ICD-10-CM | POA: Diagnosis not present

## 2021-01-28 DIAGNOSIS — Z79899 Other long term (current) drug therapy: Secondary | ICD-10-CM | POA: Diagnosis not present

## 2021-01-28 DIAGNOSIS — R202 Paresthesia of skin: Secondary | ICD-10-CM | POA: Diagnosis not present

## 2021-01-30 DIAGNOSIS — U071 COVID-19: Secondary | ICD-10-CM | POA: Diagnosis not present

## 2021-01-30 DIAGNOSIS — Z20822 Contact with and (suspected) exposure to covid-19: Secondary | ICD-10-CM | POA: Diagnosis not present

## 2021-01-31 ENCOUNTER — Encounter: Payer: Self-pay | Admitting: *Deleted

## 2021-02-05 DIAGNOSIS — I10 Essential (primary) hypertension: Secondary | ICD-10-CM | POA: Diagnosis not present

## 2021-02-05 DIAGNOSIS — R972 Elevated prostate specific antigen [PSA]: Secondary | ICD-10-CM | POA: Diagnosis not present

## 2021-02-11 DIAGNOSIS — M79671 Pain in right foot: Secondary | ICD-10-CM | POA: Diagnosis not present

## 2021-02-11 DIAGNOSIS — M79604 Pain in right leg: Secondary | ICD-10-CM | POA: Diagnosis not present

## 2021-02-11 DIAGNOSIS — M545 Low back pain, unspecified: Secondary | ICD-10-CM | POA: Diagnosis not present

## 2021-02-11 DIAGNOSIS — R270 Ataxia, unspecified: Secondary | ICD-10-CM | POA: Diagnosis not present

## 2021-02-20 ENCOUNTER — Encounter: Payer: Self-pay | Admitting: Internal Medicine

## 2021-03-07 ENCOUNTER — Ambulatory Visit (HOSPITAL_COMMUNITY)
Admission: RE | Admit: 2021-03-07 | Discharge: 2021-03-07 | Disposition: A | Discharge status: Home / Self Care | Payer: BC Managed Care – PPO | Source: Ambulatory Visit | Attending: Family Medicine | Admitting: Family Medicine

## 2021-03-07 ENCOUNTER — Other Ambulatory Visit: Payer: Self-pay

## 2021-03-07 ENCOUNTER — Other Ambulatory Visit (HOSPITAL_COMMUNITY): Payer: Self-pay | Admitting: Family Medicine

## 2021-03-07 DIAGNOSIS — M47812 Spondylosis without myelopathy or radiculopathy, cervical region: Secondary | ICD-10-CM | POA: Diagnosis not present

## 2021-03-07 DIAGNOSIS — R200 Anesthesia of skin: Secondary | ICD-10-CM | POA: Diagnosis not present

## 2021-03-07 DIAGNOSIS — I10 Essential (primary) hypertension: Secondary | ICD-10-CM | POA: Diagnosis not present

## 2021-03-07 DIAGNOSIS — M4602 Spinal enthesopathy, cervical region: Secondary | ICD-10-CM | POA: Diagnosis not present

## 2021-03-07 DIAGNOSIS — M5137 Other intervertebral disc degeneration, lumbosacral region: Secondary | ICD-10-CM | POA: Diagnosis not present

## 2021-03-07 DIAGNOSIS — R202 Paresthesia of skin: Secondary | ICD-10-CM | POA: Diagnosis not present

## 2021-03-07 DIAGNOSIS — M2578 Osteophyte, vertebrae: Secondary | ICD-10-CM | POA: Diagnosis not present

## 2021-03-07 DIAGNOSIS — M47816 Spondylosis without myelopathy or radiculopathy, lumbar region: Secondary | ICD-10-CM | POA: Diagnosis not present

## 2021-03-07 IMAGING — DX DG CERVICAL SPINE COMPLETE 4+V
5 series · 5 of 5 positions shown · non-contrast
Comparison: None.

CLINICAL DATA: Numbness on right side. Patient reports right-sided
numbness from neck down to leg.

EXAM:
CERVICAL SPINE - COMPLETE 4+ VIEW

[c-spine lat]
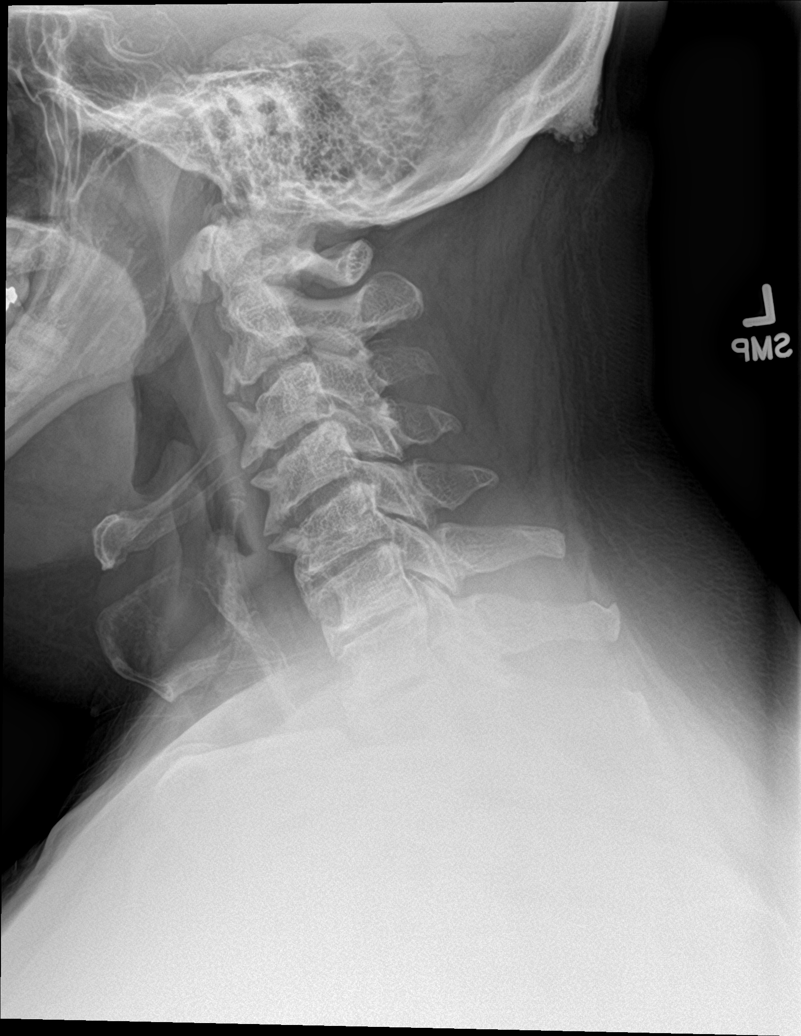

[c-spine obl (1 of 2)]
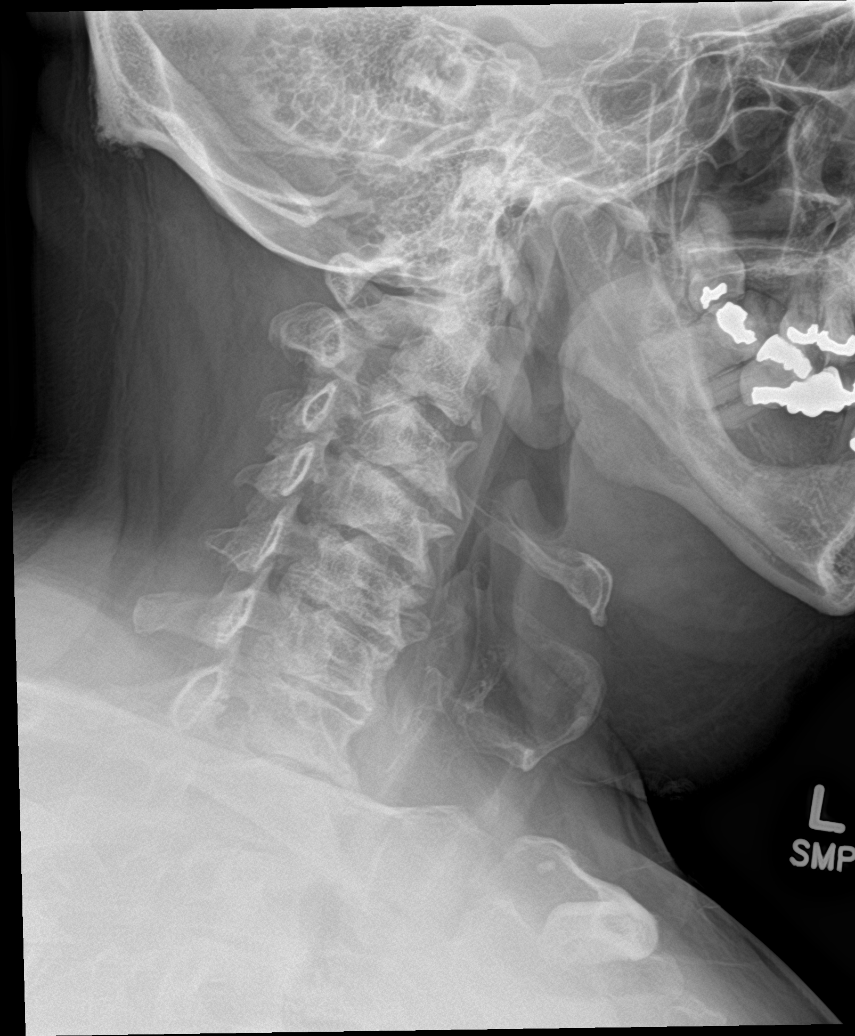

[c-spine obl (2 of 2)]
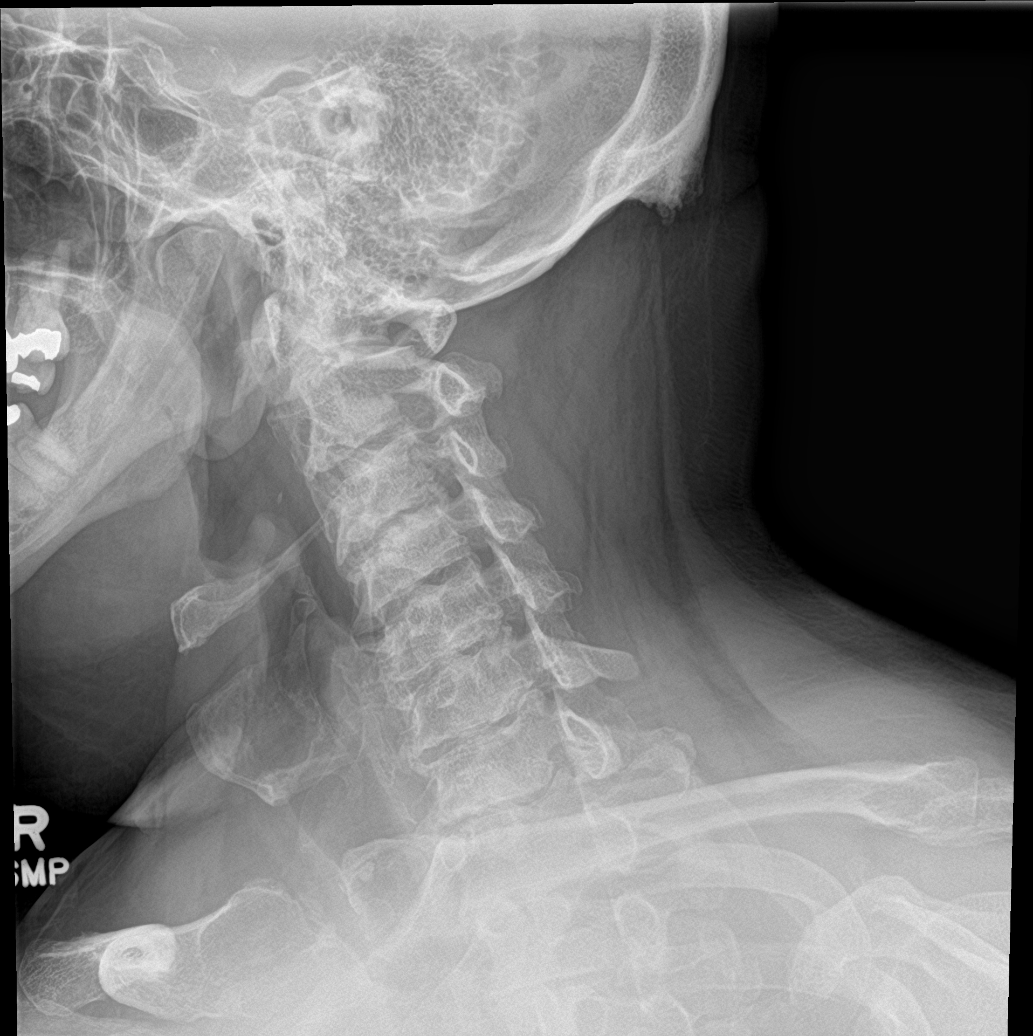

[c-spine ap]
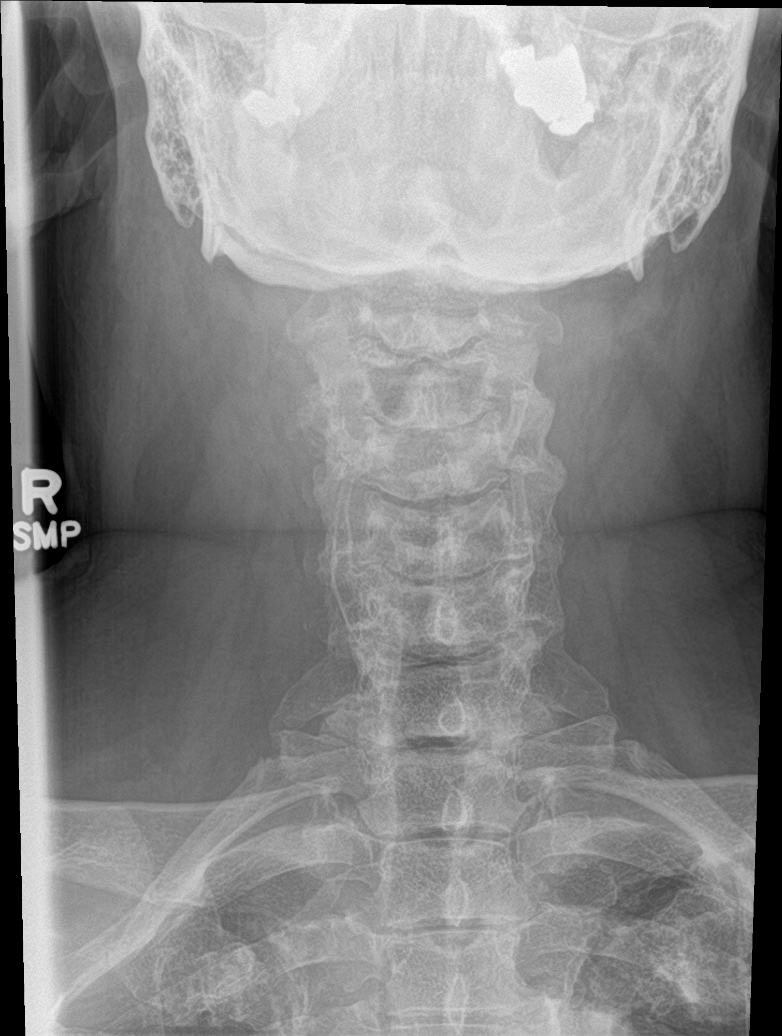

[c-spine open mouth]
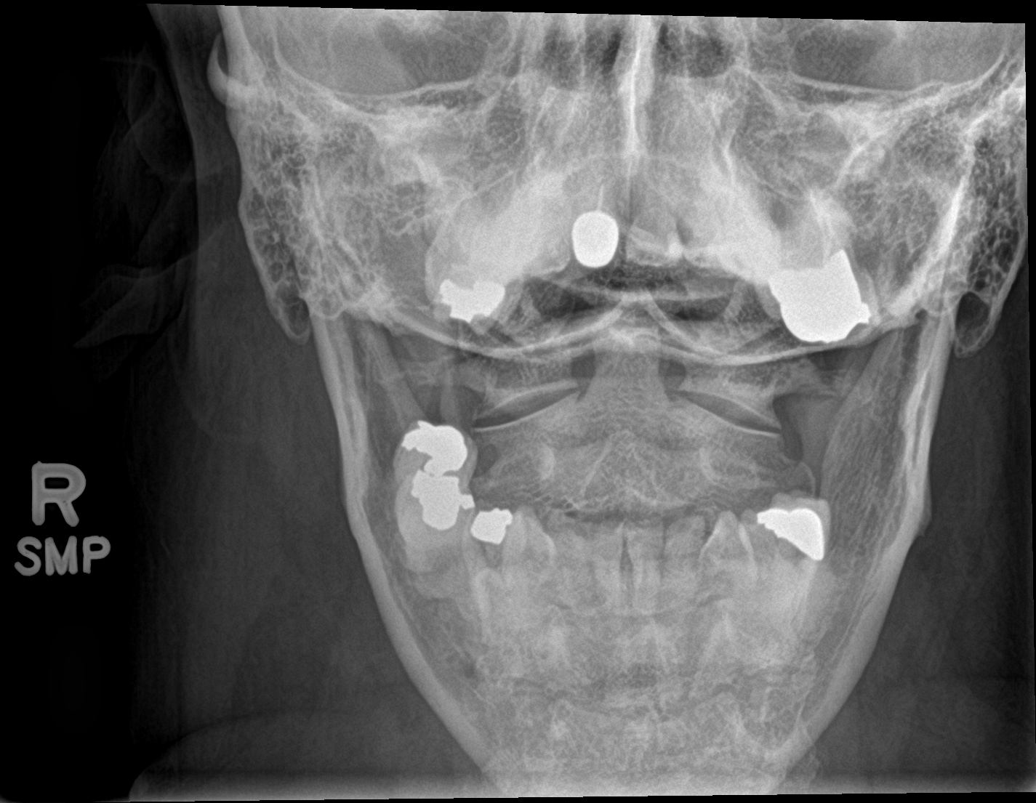

[5 of 5 positions shown; findings below may reference images not displayed]

FINDINGS: Straightening of normal lordosis. No listhesis. Large anterior spurs
from C2-C3 through C4-C5, with lesser spurring in the lower cervical
spine. There is mild diffuse disc space narrowing. Multilevel facet
hypertrophy. Mild multilevel right neural foraminal narrowing, most
prominent at C5-C6. no evidence of fracture, focal bone lesion or
bone destruction. No prevertebral soft tissue edema.
IMPRESSION: Multilevel degenerative disc disease and facet hypertrophy with
large anterior osteophytes from C2-C3 through C4-C5. Mild multilevel
right neural foraminal narrowing, most prominent at C5-C6.

## 2021-03-07 IMAGING — DX DG LUMBAR SPINE COMPLETE 4+V
5 series · 5 of 5 positions shown · non-contrast
Comparison: Lumbar radiograph [DATE]

CLINICAL DATA: Numbness on right side. Patient reports numbness
from neck to leg.

EXAM:
LUMBAR SPINE - COMPLETE 4+ VIEW

[l-spine ap]
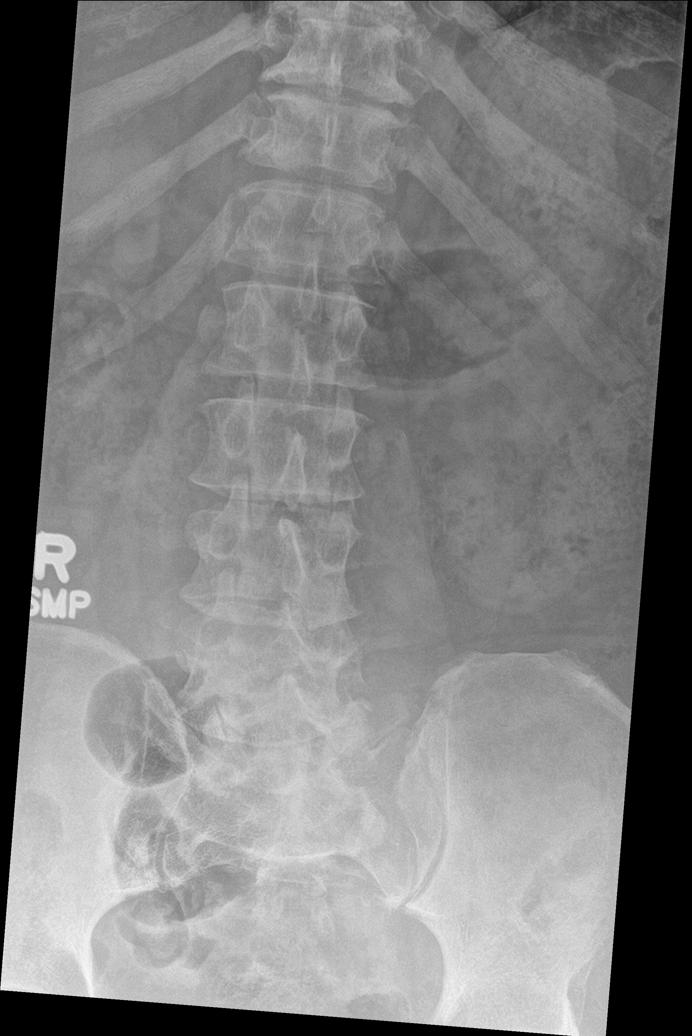

[l-spine obl (1 of 2)]
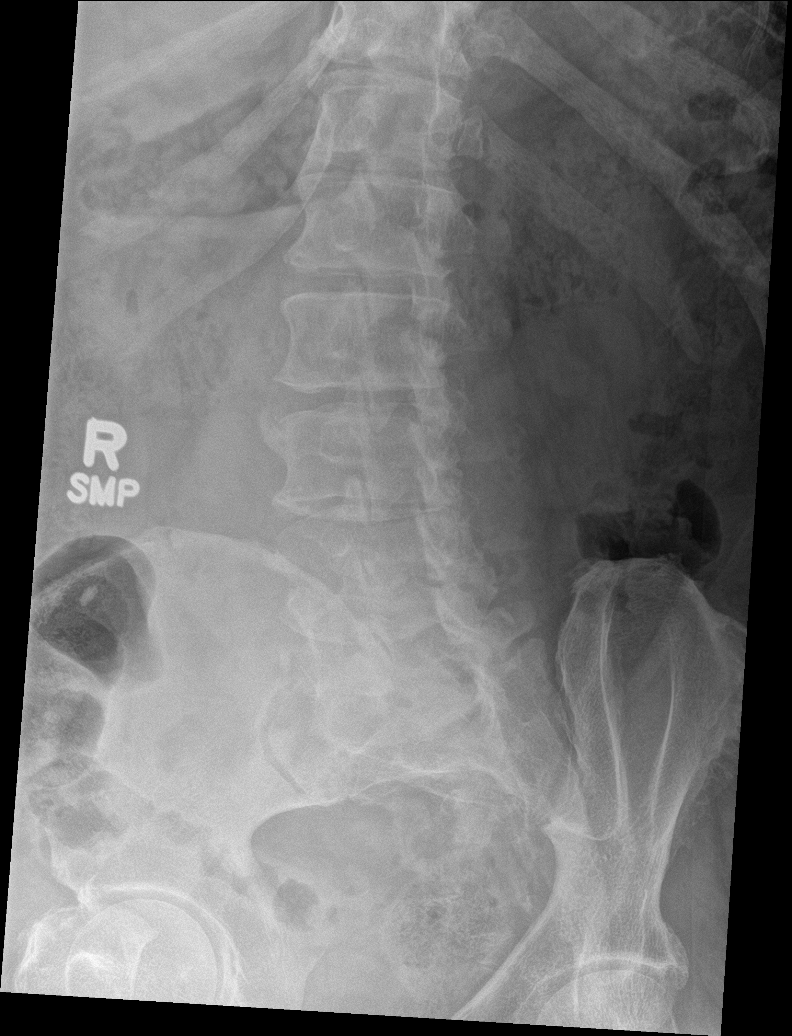

[l-spine obl (2 of 2)]
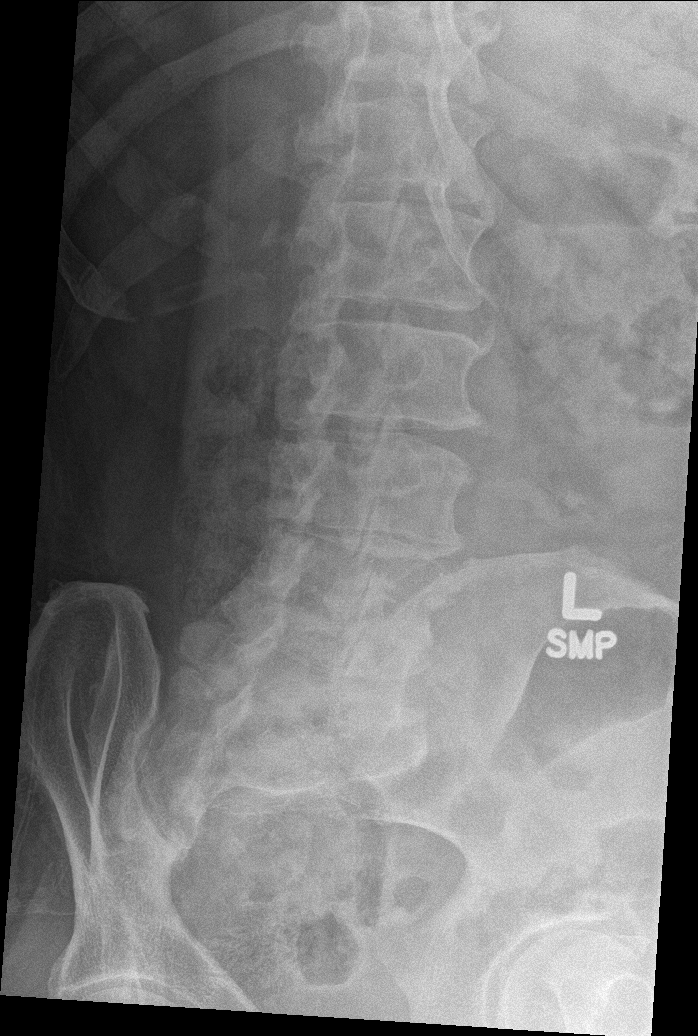

[l-spine lat]
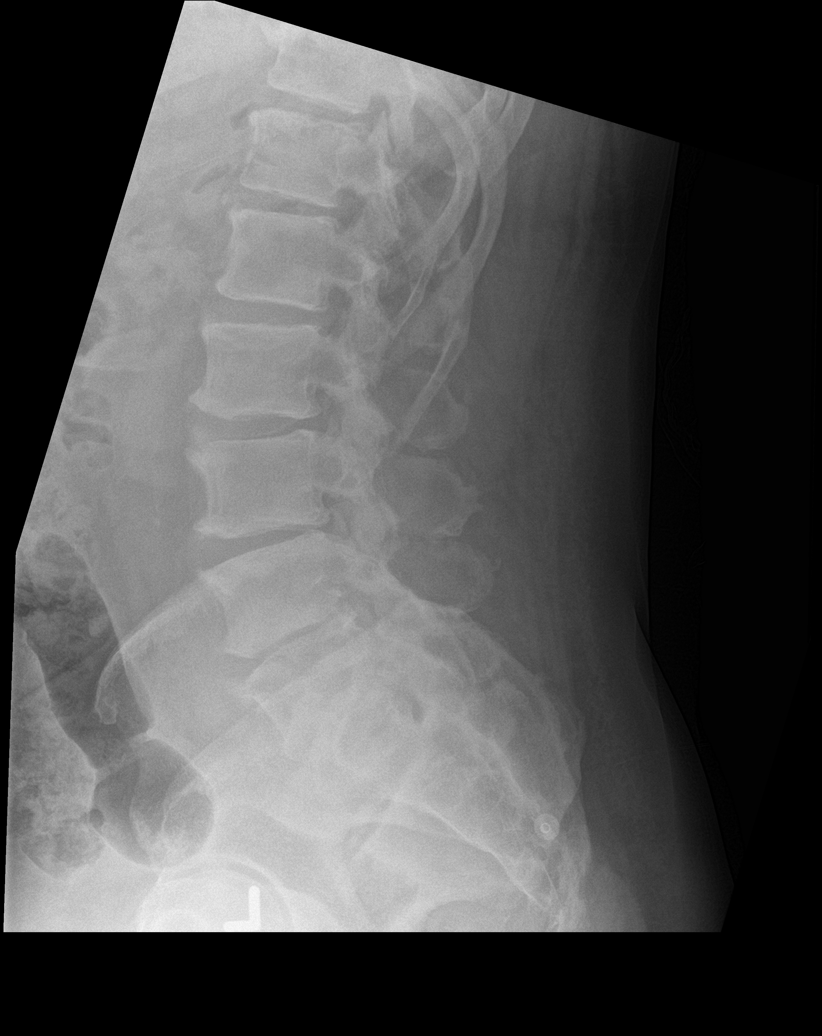

[l-spine spot]
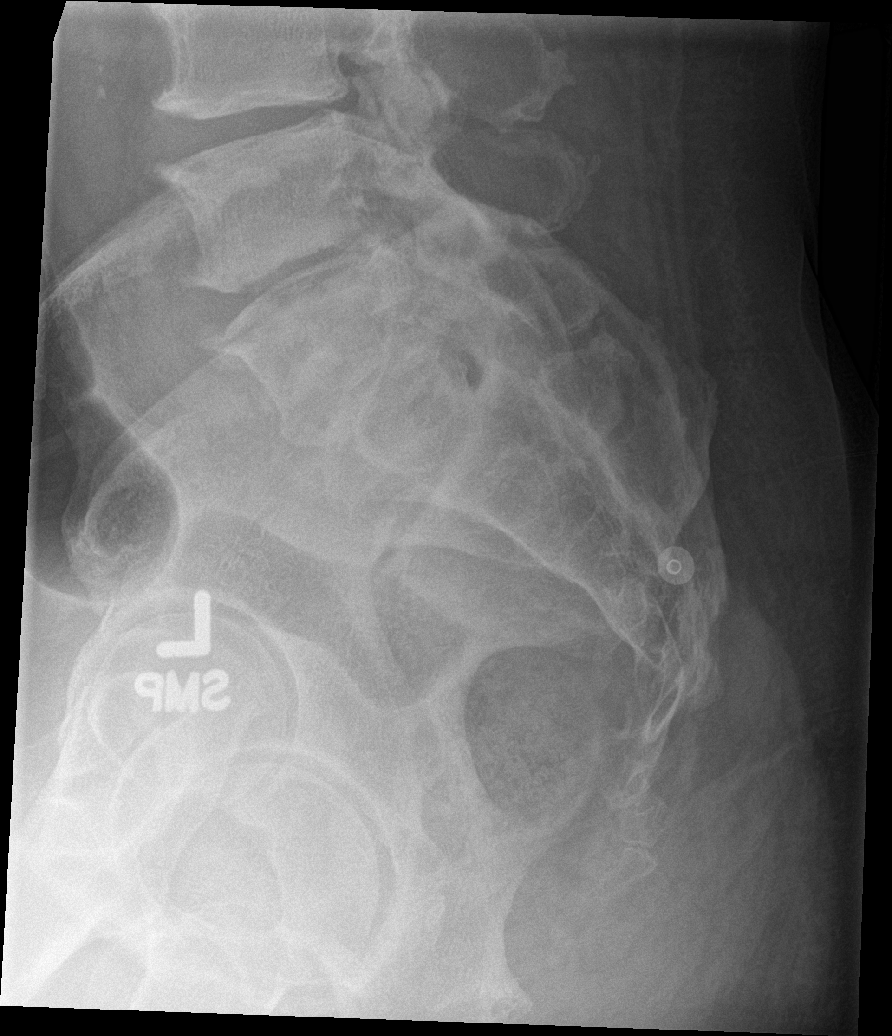

[5 of 5 positions shown; findings below may reference images not displayed]

FINDINGS: There are 5 lumbar type vertebra. Minimal broad-based rightward
curvature, no listhesis. Diffuse degenerative disc disease with disc
space narrowing and endplate spurring, most prominent at L4-L5 and
L5-S1. There is diffuse facet hypertrophy. Degenerative changes have
progressed from prior exam. Vertebral body heights are normal. No
evidence of fracture, focal bone lesion or bone destruction. The
sacroiliac joints are congruent.
IMPRESSION: Multilevel degenerative disc disease and facet hypertrophy
throughout the lumbar spine, most prominent at L4-L5 and L5-S1,
progressed from prior exam.

## 2021-03-11 DIAGNOSIS — M545 Low back pain, unspecified: Secondary | ICD-10-CM | POA: Diagnosis not present

## 2021-03-11 DIAGNOSIS — R531 Weakness: Secondary | ICD-10-CM | POA: Diagnosis not present

## 2021-03-11 DIAGNOSIS — R29898 Other symptoms and signs involving the musculoskeletal system: Secondary | ICD-10-CM | POA: Diagnosis not present

## 2021-03-11 DIAGNOSIS — G5603 Carpal tunnel syndrome, bilateral upper limbs: Secondary | ICD-10-CM | POA: Diagnosis not present

## 2021-03-11 DIAGNOSIS — R270 Ataxia, unspecified: Secondary | ICD-10-CM | POA: Diagnosis not present

## 2021-03-21 DIAGNOSIS — M5030 Other cervical disc degeneration, unspecified cervical region: Secondary | ICD-10-CM | POA: Diagnosis not present

## 2021-03-21 DIAGNOSIS — I10 Essential (primary) hypertension: Secondary | ICD-10-CM | POA: Diagnosis not present

## 2021-03-21 DIAGNOSIS — G8191 Hemiplegia, unspecified affecting right dominant side: Secondary | ICD-10-CM | POA: Diagnosis not present

## 2021-03-21 DIAGNOSIS — M5136 Other intervertebral disc degeneration, lumbar region: Secondary | ICD-10-CM | POA: Diagnosis not present

## 2021-03-24 ENCOUNTER — Other Ambulatory Visit (HOSPITAL_COMMUNITY): Payer: Self-pay | Admitting: Family Medicine

## 2021-03-24 ENCOUNTER — Other Ambulatory Visit: Payer: Self-pay | Admitting: Family Medicine

## 2021-03-24 DIAGNOSIS — G8191 Hemiplegia, unspecified affecting right dominant side: Secondary | ICD-10-CM

## 2021-04-01 ENCOUNTER — Other Ambulatory Visit: Payer: Self-pay

## 2021-04-01 ENCOUNTER — Ambulatory Visit (HOSPITAL_COMMUNITY)
Admission: RE | Admit: 2021-04-01 | Discharge: 2021-04-01 | Disposition: A | Discharge status: Home / Self Care | Payer: BC Managed Care – PPO | Source: Ambulatory Visit | Attending: Family Medicine | Admitting: Family Medicine

## 2021-04-01 DIAGNOSIS — G8191 Hemiplegia, unspecified affecting right dominant side: Secondary | ICD-10-CM | POA: Insufficient documentation

## 2021-04-01 DIAGNOSIS — R531 Weakness: Secondary | ICD-10-CM | POA: Diagnosis not present

## 2021-04-01 IMAGING — MR MR HEAD W/O CM
14 series · 48 of 48 positions shown · non-contrast
Comparison: No prior MRI, correlation is made with CT head
[DATE]

CLINICAL DATA: Weakness

EXAM:
MRI HEAD WITHOUT CONTRAST
TECHNIQUE: Multiplanar, multiecho pulse sequences of the brain and surrounding
structures were obtained without intravenous contrast.

[Series 5: DWI · axial · 3.0mm · 0.77mm/px · z∈[-53,+92]mm · 4 of 50 slices shown (1 of 5)]
[im 1/50]
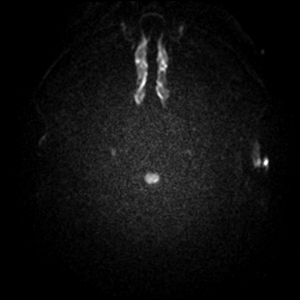
[im 17/50]
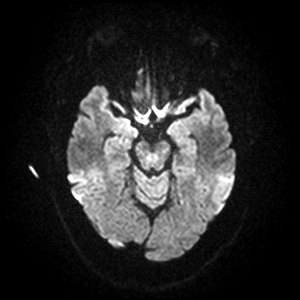
[im 33/50]
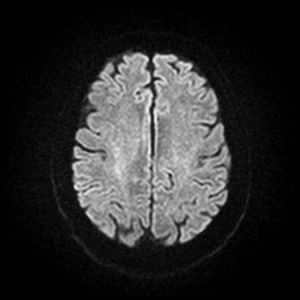
[im 50/50]
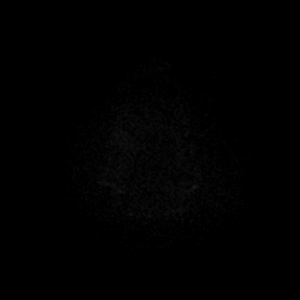

[Series 6: DWI · axial · 3.0mm · 0.77mm/px · z∈[-53,+92]mm · 4 of 50 slices shown (2 of 5)]
[im 1/50]
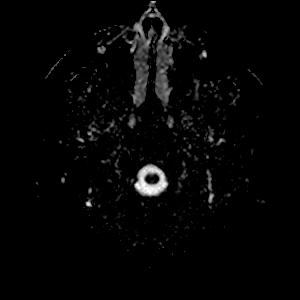
[im 17/50]
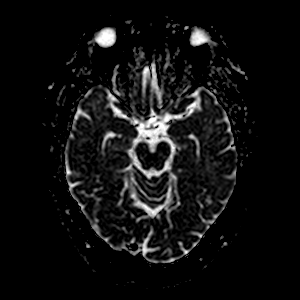
[im 33/50]
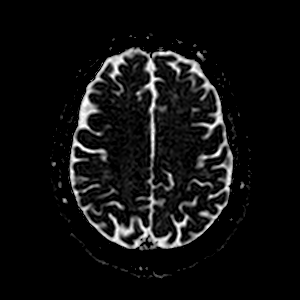
[im 50/50]
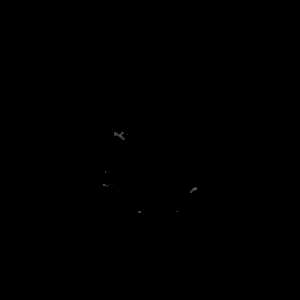

[Series 7: DWI · coronal · 5.0mm · 0.88mm/px · 2 of 28 slices shown (3 of 5)]
[im 1/28]
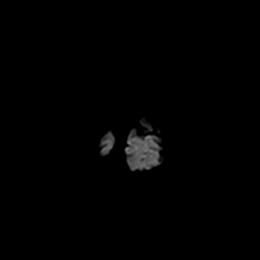
[im 28/28]
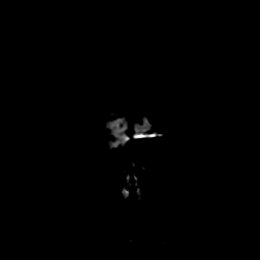

[Series 7: DWI · coronal · 5.0mm · 0.88mm/px · 2 of 28 slices shown (4 of 5)]
[im 1/28]
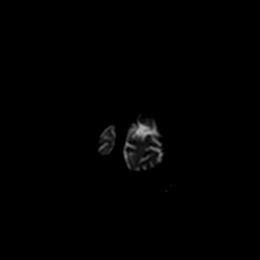
[im 28/28]
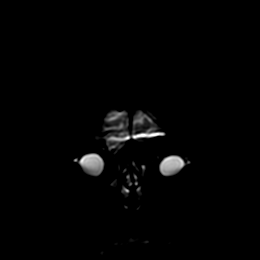

[Series 8: DWI · coronal · 5.0mm · 0.88mm/px · 2 of 28 slices shown (5 of 5)]
[im 1/28]
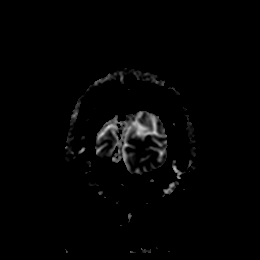
[im 28/28]
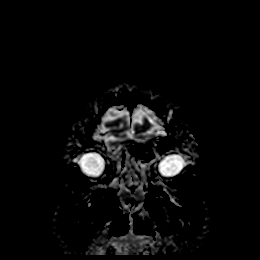

[Series 9: T1 · sagittal · 5.0mm · 0.75mm/px · 1 of 21 slices shown (1 of 2)]
[im 1/21]
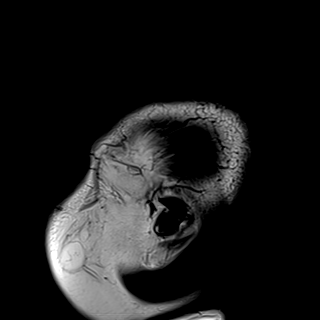

[Series 10: T2 · axial · 5.0mm · 0.72mm/px · z∈[-56,+96]mm · 2 of 23 slices shown (1 of 2)]
[im 1/23]
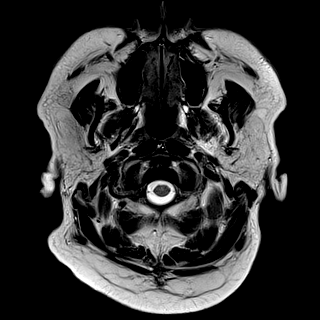
[im 23/23]
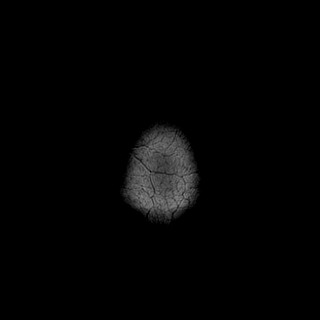

[Series 11: mag_images · axial · 3.0mm · 0.90mm/px · z∈[-69,+106]mm · 4 of 60 slices shown]
[im 1/60]
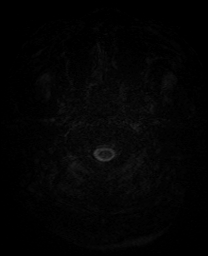
[im 20/60]
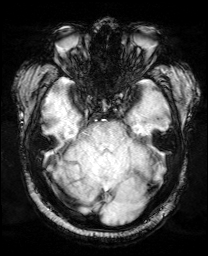
[im 40/60]
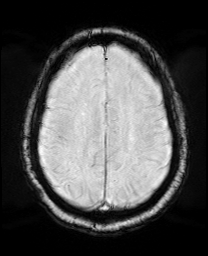
[im 60/60]
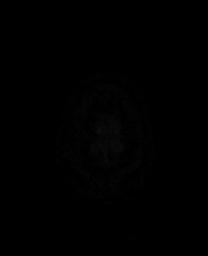

[Series 12: pha_images · axial · 3.0mm · 0.90mm/px · z∈[-69,+106]mm · 4 of 60 slices shown]
[im 1/60]
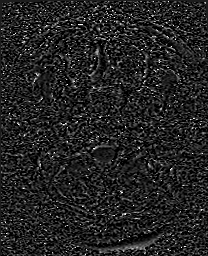
[im 20/60]
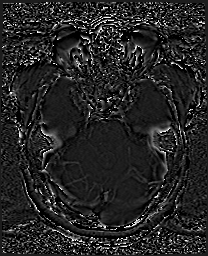
[im 40/60]
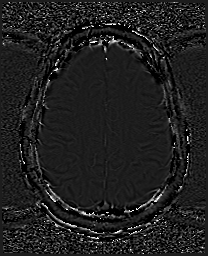
[im 60/60]
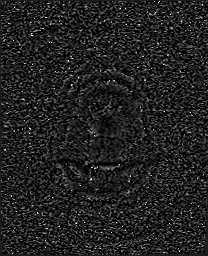

[Series 13: swi_images · axial · 3.0mm · 0.90mm/px · z∈[-69,+106]mm · 4 of 60 slices shown]
[im 1/60]
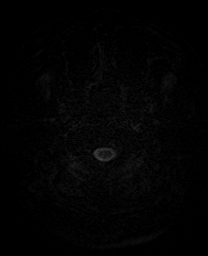
[im 20/60]
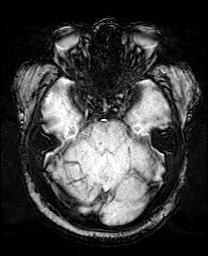
[im 40/60]
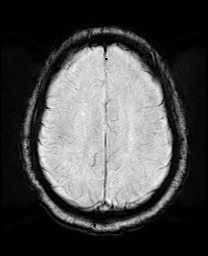
[im 60/60]
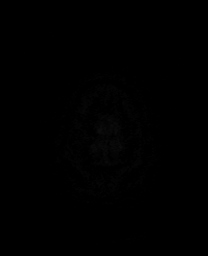

[Series 15: FLAIR · axial · 3.0mm · 0.45mm/px · z∈[-54,+91]mm · 4 of 50 slices shown (1 of 2)]
[im 1/50]
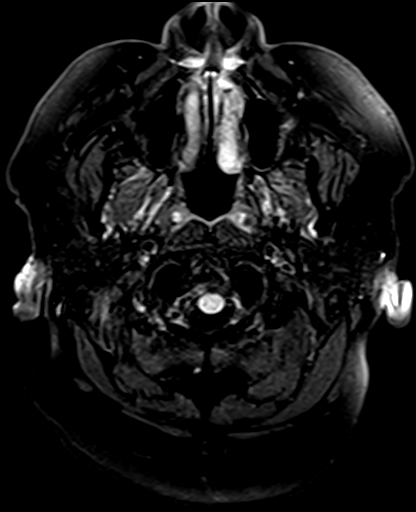
[im 17/50]
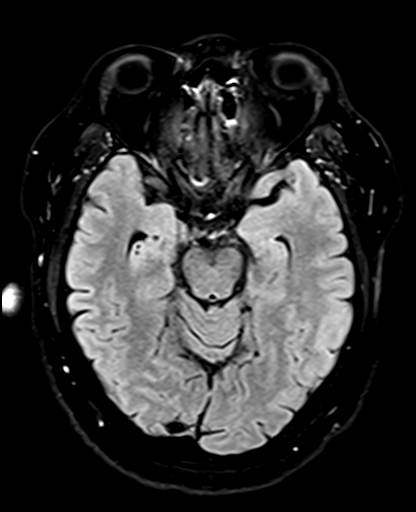
[im 33/50]
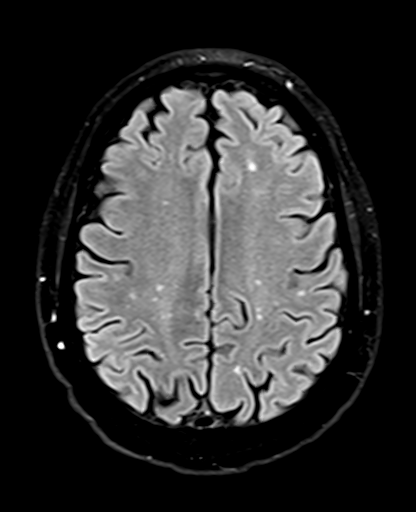
[im 50/50]
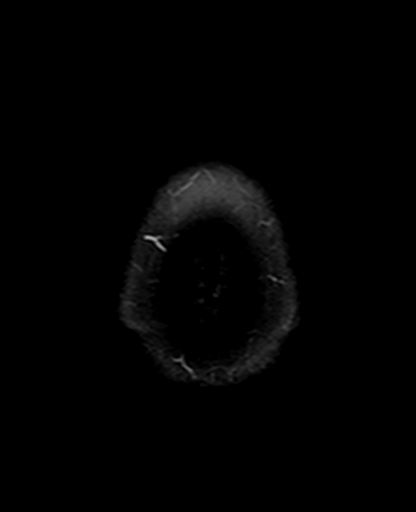

[Series 16: T1 · axial · 1.0mm · 0.98mm/px · z∈[-66,+107]mm · 12 of 176 slices shown (2 of 2)]
[im 1/176]
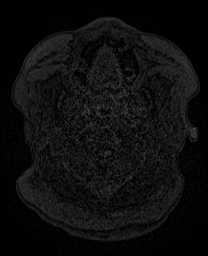
[im 16/176]
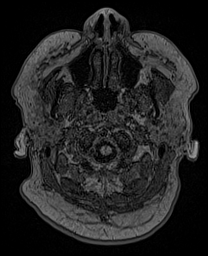
[im 32/176]
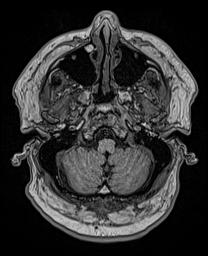
[im 48/176]
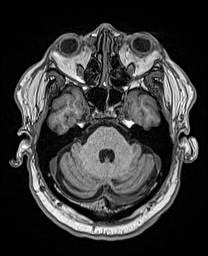
[im 64/176]
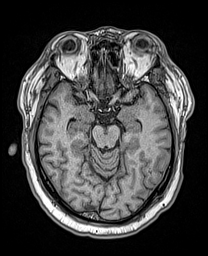
[im 80/176]
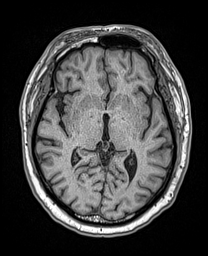
[im 96/176]
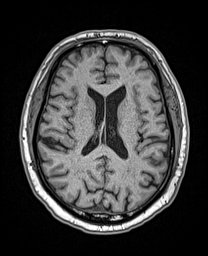
[im 112/176]
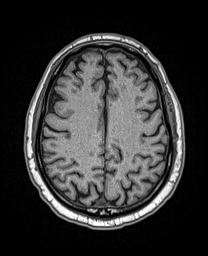
[im 128/176]
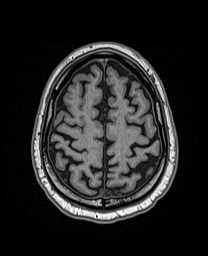
[im 144/176]
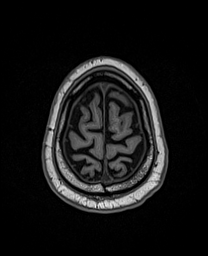
[im 160/176]
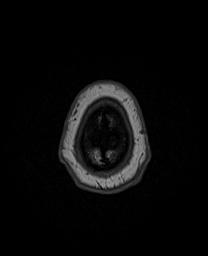
[im 176/176]
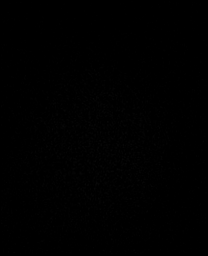

[Series 17: T2 · coronal · 5.0mm · 0.72mm/px · 2 of 28 slices shown (2 of 2)]
[im 1/28]
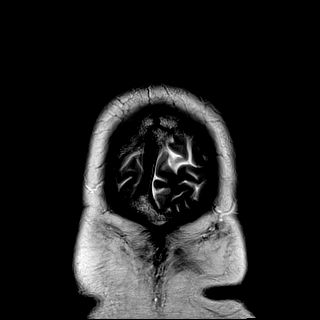
[im 28/28]
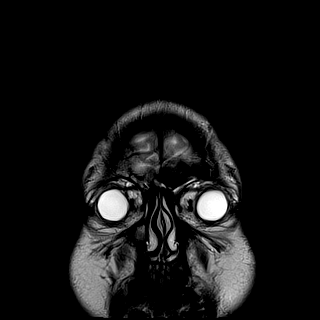

[Series 18: FLAIR · sagittal · 5.0mm · 0.94mm/px · 1 of 21 slices shown (2 of 2)]
[im 1/21]
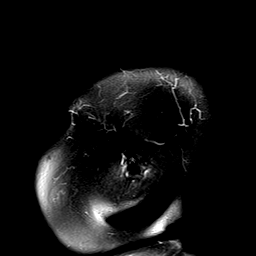

[48 of 48 positions shown; findings below may reference images not displayed]

FINDINGS: Brain: No acute infarction, hemorrhage, hydrocephalus, extra-axial
collection or mass lesion. Scattered T2 hyperintense signal in the
periventricular white matter, likely the sequela of chronic small
vessel ischemic disease.

Vascular: Normal flow voids.

Skull and upper cervical spine: Normal marrow signal.

Sinuses/Orbits: Mucous retention cysts in the right maxillary sinus
and left ethmoid air cells. The orbits are unremarkable.

Other: The mastoids are well aerated.
IMPRESSION: No acute intracranial process. No etiology is seen for the patient's
weakness.

## 2021-04-02 DIAGNOSIS — G8191 Hemiplegia, unspecified affecting right dominant side: Secondary | ICD-10-CM | POA: Diagnosis not present

## 2021-04-02 DIAGNOSIS — M5030 Other cervical disc degeneration, unspecified cervical region: Secondary | ICD-10-CM | POA: Diagnosis not present

## 2021-04-02 DIAGNOSIS — E1165 Type 2 diabetes mellitus with hyperglycemia: Secondary | ICD-10-CM | POA: Diagnosis not present

## 2021-04-02 DIAGNOSIS — I10 Essential (primary) hypertension: Secondary | ICD-10-CM | POA: Diagnosis not present

## 2021-04-07 ENCOUNTER — Ambulatory Visit (INDEPENDENT_AMBULATORY_CARE_PROVIDER_SITE_OTHER): Payer: Self-pay | Admitting: *Deleted

## 2021-04-07 ENCOUNTER — Other Ambulatory Visit: Payer: Self-pay

## 2021-04-07 VITALS — Ht 69.0 in | Wt 255.6 lb

## 2021-04-07 DIAGNOSIS — Z86010 Personal history of colonic polyps: Secondary | ICD-10-CM

## 2021-04-07 MED ORDER — NA SULFATE-K SULFATE-MG SULF 17.5-3.13-1.6 GM/177ML PO SOLN: 1.0000 | Freq: Once | ORAL | 0 refills | Status: AC

## 2021-04-07 NOTE — Progress Notes (Signed)
Pt made aware that I will call him once Nov procedure schedules have been released.

## 2021-04-07 NOTE — Progress Notes (Signed)
Gastroenterology Pre-Procedure Review  Request Date: 04/07/2021 Requesting Physician: 7 year recall, Last TCS 02/15/2014 done by Dr. Gala Romney, tubular adenoma  PATIENT REVIEW QUESTIONS: The patient responded to the following health history questions as indicated:    1. Diabetes Melitis: yes, type II  2. Joint replacements in the past 12 months: no 3. Major health problems in the past 3 months: yes, tingling in neck, right hand, and right leg, pt says symptoms similar to stroke pt 4. Has an artificial valve or MVP: no 5. Has a defibrillator: no 6. Has been advised in past to take antibiotics in advance of a procedure like teeth cleaning: no 7. Family history of colon cancer: no  8. Alcohol Use: no 9. Illicit drug Use: no 10. History of sleep apnea: no  11. History of coronary artery or other vascular stents placed within the last 12 months: no 12. History of any prior anesthesia complications: no 13. Body mass index is 37.75 kg/m.    MEDICATIONS & ALLERGIES:    Patient reports the following regarding taking any blood thinners:   Plavix? no Aspirin? Yes, 81 mg  Coumadin? no Brilinta? no Xarelto? no Eliquis? no Pradaxa? no Savaysa? no Effient? no  Patient confirms/reports the following medications:  Current Outpatient Medications  Medication Sig Dispense Refill   ascorbic acid (VITAMIN C) 500 MG tablet Take 500 mg by mouth every morning.      aspirin 81 MG tablet Take 81 mg by mouth every morning.      atorvastatin (LIPITOR) 80 MG tablet Take 80 mg by mouth at bedtime.      B Complex Vitamins (B COMPLEX 100 PO) Take 1 tablet by mouth every morning.      Cetirizine HCl 10 MG CAPS Take 1 capsule by mouth 2 (two) times a day.      cloNIDine (CATAPRES) 0.1 MG tablet Take 0.1 mg by mouth 2 (two) times daily.     diltiazem (DILACOR XR) 240 MG 24 hr capsule Take 240 mg by mouth at bedtime.      hydrochlorothiazide (HYDRODIURIL) 25 MG tablet Take 25 mg by mouth daily.     lisinopril  (PRINIVIL,ZESTRIL) 20 MG tablet Take 20 mg by mouth daily.     metFORMIN (GLUCOPHAGE) 1000 MG tablet Take by mouth 2 (two) times daily.     potassium chloride (K-DUR) 10 MEQ tablet Take 1 tablet by mouth daily.  1   No current facility-administered medications for this visit.    Patient confirms/reports the following allergies:  No Known Allergies  No orders of the defined types were placed in this encounter.   AUTHORIZATION INFORMATION Primary Insurance: [place],  [place] #: [id number],  Group #: [id number] Pre-Cert / Josem Kaufmann required:  Pre-Cert / Auth #:   Secondary Insurance: Medicare Part A, ID#: [id number] Pre-Cert/ Auth required: No, not required  SCHEDULE INFORMATION: Procedure has been scheduled as follows:  Date: , Time:   Location: [facility] with Dr. Gala Romney  This Gastroenterology Pre-Precedure Review Form is being routed to the following provider(s):  Aliene Altes, PA-C

## 2021-04-07 NOTE — Patient Instructions (Signed)
Andrew Arnold  [birth year] MRN: [id number]     Procedure Date:  Time to register:  Place to register: [facility] Scheduled provider: Dr. Gala Romney    PREPARATION FOR COLONOSCOPY WITH SUPREP BOWEL PREP KIT  Note: Suprep Bowel Prep Kit is a split-dose (2day) regimen. Consumption of BOTH 6-ounce bottles is required for a complete prep.  Please notify us immediately if you are diabetic, take iron supplements, or if you are on Coumadin or any other blood thinners.  Weight loss medications must be held 7 days prior to your procedure.   Please notify us of any medication changes at least 7 days prior to your procedure.   Please hold the following medications:  See letter.                                                                                                                                                  2 DAYS BEFORE PROCEDURE:  DATE:    DAY:  Begin clear liquid diet AFTER your lunch meal. NO SOLID FOODS after this point.  1 DAY BEFORE PROCEDURE:  DATE:    DAY:  Continue clear liquids the entire day - NO SOLID FOOD.   Diabetic medications adjustments for today: See letter.  At 6:00pm: Complete steps 1 through 4 below, using ONE (1) 6-ounce bottle, before going to bed. Step 1:  Pour ONE (1) 6-ounce bottle of SUPREP liquid into the mixing container.  Step 2:  Add cool drinking water to the 16 ounce line on the container and mix.  Note: Dilute the solution concentrate as directed prior to use. Step 3:  DRINK ALL the liquid in the container. Step 4:  You MUST drink an additional two (2) or more 16 ounce containers of water over the next one (1) hour.   Continue clear liquids.  DAY OF PROCEDURE:   DATE:    DAY:  If you take medications for your heart, blood pressure, or breathing, you may take these medications.  Diabetic medications adjustments for today: See letter.  5 hours before your procedure at : Step 1:  Pour ONE (1) 6-ounce bottle of SUPREP liquid into  the mixing container.  Step 2:  Add cool drinking water to the 16 ounce line on the container and mix.  Note: Dilute the solution concentrate as directed prior to use. Step 3:  DRINK ALL the liquid in the container. Step 4:  You MUST drink an additional two (2) or more 16 ounce containers of water over the next one (1) hour. You MUST complete the final glass of water at least 3 hours before your colonoscopy. Nothing by mouth past   You may take your morning medications with sip of water unless we have instructed otherwise.    Please see below for Dietary Information.  CLEAR LIQUIDS INCLUDE:  Water Jello (  NOT red in color)   Ice Popsicles (NOT red in color)   Tea (sugar ok, no milk/cream) Powdered fruit flavored drinks  Coffee (sugar ok, no milk/cream) Gatorade/ Lemonade/ Kool-Aid  (NOT red in color)   Juice: apple, white grape, white cranberry Soft drinks  Clear bullion, consomme, broth (fat free beef/chicken/vegetable)  Carbonated beverages (any kind)  Strained chicken noodle soup Hard Candy   Remember: Clear liquids are liquids that will allow you to see your fingers on the other side of a clear glass. Be sure liquids are NOT red in color, and not cloudy, but CLEAR.  DO NOT EAT OR DRINK ANY OF THE FOLLOWING:  Dairy products of any kind   Cranberry juice Tomato juice / V8 juice   Grapefruit juice Orange juice     Red grape juice  Do not eat any solid foods, including such foods as: cereal, oatmeal, yogurt, fruits, vegetables, creamed soups, eggs, bread, crackers, pureed foods in a blender, etc.   HELPFUL HINTS FOR DRINKING PREP SOLUTION:  Make sure prep is extremely cold. Mix and refrigerate the the morning of the prep. You may also put in the freezer.  You may try mixing some Crystal Light or Country Time Lemonade if you prefer. Mix in small amounts; add more if necessary. Try drinking through a straw Rinse mouth with water or a mouthwash between glasses, to remove  after-taste. Try sipping on a cold beverage /ice/ popsicles between glasses of prep. Place a piece of sugar-free hard candy in mouth between glasses. If you become nauseated, try consuming smaller amounts, or stretch out the time between glasses. Stop for 30-60 minutes, then slowly start back drinking.     OTHER INSTRUCTIONS  You will need to have a responsible adult immediately available after your procedure to receive discharge instructions then drive you home. We strongly encourage your responsible adult to remain in the hospital during your procedure.  Your procedure will be canceled if a responsible adult is not available.  Wear loose fitting clothing that is easily removed. Leave jewelry and other valuables at home.  Remove all body piercing jewelry and leave at home. Total time from sign-in until discharge is approximately 2-3 hours. You should go home directly after your procedure and rest. You can resume normal activities the day after your procedure. The day of your procedure you should not: Drive Make legal decisions Operate machinery Drink alcohol Return to work   You may call the office (Dept: [phone]) before 5:00pm, or page the doctor on call [phone]) after 5:00pm, for further instructions, if necessary.   Insurance Information YOU WILL NEED TO CHECK WITH YOUR INSURANCE COMPANY FOR THE BENEFITS OF COVERAGE YOU HAVE FOR THIS PROCEDURE.  UNFORTUNATELY, NOT ALL INSURANCE COMPANIES HAVE BENEFITS TO COVER ALL OR PART OF THESE TYPES OF PROCEDURES.  IT IS YOUR RESPONSIBILITY TO CHECK YOUR BENEFITS, HOWEVER, WE WILL BE GLAD TO ASSIST YOU WITH ANY CODES YOUR INSURANCE COMPANY MAY NEED.    PLEASE NOTE THAT MOST INSURANCE COMPANIES WILL NOT COVER A SCREENING COLONOSCOPY FOR PEOPLE UNDER THE AGE OF 50  IF YOU HAVE BCBS INSURANCE, YOU MAY HAVE BENEFITS FOR A SCREENING COLONOSCOPY BUT IF POLYPS ARE FOUND THE DIAGNOSIS WILL CHANGE AND THEN YOU MAY HAVE A DEDUCTIBLE THAT WILL  NEED TO BE MET. SO PLEASE MAKE SURE YOU CHECK YOUR BENEFITS FOR A SCREENING COLONOSCOPY AS WELL AS A DIAGNOSTIC COLONOSCOPY.

## 2021-04-08 NOTE — Progress Notes (Signed)
?  I do not see that patient has history of stroke.,  But if he is having tingling in his neck, hand, and leg which he feels is similar to a prior stroke, would recommend follow-up with PCP for further evaluation prior to scheduling colonoscopy.

## 2021-04-10 ENCOUNTER — Encounter: Payer: Self-pay | Admitting: Urology

## 2021-04-10 ENCOUNTER — Ambulatory Visit (INDEPENDENT_AMBULATORY_CARE_PROVIDER_SITE_OTHER): Payer: BC Managed Care – PPO | Admitting: Urology

## 2021-04-10 ENCOUNTER — Other Ambulatory Visit: Payer: Self-pay

## 2021-04-10 VITALS — BP 132/81 | HR 73 | Temp 98.4°F | Ht 69.0 in | Wt 258.0 lb

## 2021-04-10 DIAGNOSIS — N401 Enlarged prostate with lower urinary tract symptoms: Secondary | ICD-10-CM

## 2021-04-10 DIAGNOSIS — N3941 Urge incontinence: Secondary | ICD-10-CM

## 2021-04-10 DIAGNOSIS — R351 Nocturia: Secondary | ICD-10-CM | POA: Diagnosis not present

## 2021-04-10 DIAGNOSIS — R3915 Urgency of urination: Secondary | ICD-10-CM

## 2021-04-10 DIAGNOSIS — R350 Frequency of micturition: Secondary | ICD-10-CM

## 2021-04-10 DIAGNOSIS — R7309 Other abnormal glucose: Secondary | ICD-10-CM | POA: Insufficient documentation

## 2021-04-10 LAB — MICROSCOPIC EXAMINATION
Bacteria, UA: NONE SEEN
Epithelial Cells (non renal): NONE SEEN /hpf (ref 0–10)
Renal Epithel, UA: NONE SEEN /hpf
WBC, UA: NONE SEEN /hpf (ref 0–5)

## 2021-04-10 LAB — URINALYSIS, ROUTINE W REFLEX MICROSCOPIC
Bilirubin, UA: NEGATIVE
Glucose, UA: NEGATIVE
Leukocytes,UA: NEGATIVE
Nitrite, UA: NEGATIVE
RBC, UA: NEGATIVE
Specific Gravity, UA: 1.025 (ref 1.005–1.030)
Urobilinogen, Ur: 1 mg/dL (ref 0.2–1.0)
pH, UA: 5.5 (ref 5.0–7.5)

## 2021-04-10 LAB — BLADDER SCAN AMB NON-IMAGING: Scan Result: 84

## 2021-04-10 MED ORDER — TAMSULOSIN HCL 0.4 MG PO CAPS: 0.4000 mg | ORAL_CAPSULE | Freq: Every day | ORAL | 11 refills | Status: DC

## 2021-04-10 NOTE — Progress Notes (Signed)
Spoke to pt.  He was made aware to follow up with PCP for further evaluation prior to scheduling colonoscopy.  Pt voiced understanding and will call us back after consulting with PCP.

## 2021-04-10 NOTE — Progress Notes (Signed)
post void residual=84

## 2021-04-10 NOTE — Progress Notes (Signed)

## 2021-04-10 NOTE — Progress Notes (Signed)
Noted  

## 2021-04-10 NOTE — Progress Notes (Signed)
Subjective: 1. Benign prostatic hyperplasia with urinary frequency   2. Nocturia   3. Urinary frequency   4. Urgency of urination   5. Urge incontinence   6. Elevated hemoglobin A1c      Consult requested by Dr. [name].   Andrew Arnold is a 66 yo male who is sent for evaluation of voiding symptoms.   He has about a 3 month history  frequency q1-2hrs but his main compaint is nocturia 3+ x nightly.  He has some urgency and with incontinence and is wearing depends.  He has a good stream and feels he empties.   He has no dysuria and no hematuria.  He has no other GU surgery.  His symptoms are not impacted by the HCTZ.  He has not been treated for his symptoms.  He had a PSA that was 3 in 6/20.   He has some RUE numbness and RLE weakness for 3 months and is seeing neurology for that.  He has some dizziness in the AM.  He had a normal MRI of the head on 04/01/21.   He had some lumbar DDD on a recent spine series.  His UA has 0-2 RBC's and his PVR is 18ml.   ROS:  ROS  No Known Allergies  Past Medical History:  Diagnosis Date   Allergic rhinitis due to pollen    on allergy shots   Arrhythmia    1980s hospitalized for HTN and ?irregular rhythm but no specific dx but resolved during hospitalization   HTN (hypertension)    Hyperlipidemia     Past Surgical History:  Procedure Laterality Date   colonoscopy  2005   Dr. Gala Romney: normal rectum, sigmoid diverticula in colonic mucosa   COLONOSCOPY N/A 02/15/2014   Procedure: COLONOSCOPY;  Surgeon: Daneil Dolin, MD;  Location: [facility];  Service: Endoscopy;  Laterality: N/A;  2:00    Social History   Socioeconomic History   Marital status: Single    Spouse name: Not on file   Number of children: Not on file   Years of education: Not on file   Highest education level: Not on file  Occupational History   Occupation: school counselor  Tobacco Use   Smoking status: Never   Smokeless tobacco: Never  Vaping Use   Vaping Use: Never  used  Substance and Sexual Activity   Alcohol use: No   Drug use: No   Sexual activity: Yes  Other Topics Concern   Not on file  Social History [place], [place]   Lives in [place] Determinants of Health   Financial Resource Strain: Not on file  Food Insecurity: Not on file  Transportation Needs: Not on file  Physical Activity: Not on file  Stress: Not on file  Social Connections: Not on file  Intimate Partner Violence: Not on file    Family History  Problem Relation Age of Onset   Cancer Mother    Cirrhosis Father        cirrhosis of the liver; alcohol related   Other Other        neice was on allergy vaccine when young   Colon cancer Neg Hx     Anti-infectives: Anti-infectives (From admission, onward)    None       Current Outpatient Medications  Medication Sig Dispense Refill   ascorbic acid (VITAMIN C) 500 MG tablet Take 500 mg by  mouth every morning.      aspirin 81 MG tablet Take 81 mg by mouth every morning.      atorvastatin (LIPITOR) 80 MG tablet Take 80 mg by mouth daily.     B Complex Vitamins (B COMPLEX 100 PO) Take 1 tablet by mouth every morning.      Cetirizine HCl 10 MG CAPS Take 1 capsule by mouth 2 (two) times a day.      cloNIDine (CATAPRES) 0.1 MG tablet Take 0.1 mg by mouth 2 (two) times daily.     diltiazem (DILACOR XR) 240 MG 24 hr capsule Take 240 mg by mouth at bedtime.      hydrochlorothiazide (HYDRODIURIL) 25 MG tablet Take 25 mg by mouth daily.     lisinopril (PRINIVIL,ZESTRIL) 20 MG tablet Take 20 mg by mouth daily.     metFORMIN (GLUCOPHAGE) 1000 MG tablet Take by mouth 2 (two) times daily.     potassium chloride (K-DUR) 10 MEQ tablet Take 1 tablet by mouth daily.  1   tamsulosin (FLOMAX) 0.4 MG CAPS capsule Take 1 capsule (0.4 mg total) by mouth daily. 30 capsule 11   No current facility-administered medications for this visit.     Objective: Vital signs in last 24 hours: BP  132/81   Pulse 73   Temp 98.4 F (36.9 C)   Ht 5\' 9"  (1.753 m)   Wt 258 lb (117 kg)   BMI 38.10 kg/m   Intake/Output from previous day: No intake/output data recorded. Intake/Output this shift: @IOTHISSHIFT @   Physical Exam Vitals reviewed.  Constitutional:      Appearance: Normal appearance. He is obese.  Cardiovascular:     Rate and Rhythm: Normal rate and regular rhythm.     Heart sounds: Normal heart sounds.  Pulmonary:     Effort: Pulmonary effort is normal. No respiratory distress.     Breath sounds: Normal breath sounds.  Abdominal:     Hernia: No hernia is present.     Comments: Obese, protruberant, firm, non-tender.   Genitourinary:    Comments: Normal phallus with adequate meatus. Scrotum, testes and epididymis normal. AP without lesions. NST without mass. Prostate 2+ firm without nodules.  SV non-palpable.  Musculoskeletal:        General: No swelling. Normal range of motion.     Cervical back: Normal range of motion and neck supple.  Lymphadenopathy:     Cervical: No cervical adenopathy.     Upper Body:     Right upper body: No supraclavicular adenopathy.     Left upper body: No supraclavicular adenopathy.     Lower Body: No right inguinal adenopathy. No left inguinal adenopathy.  Skin:    General: Skin is warm and dry.  Neurological:     General: No focal deficit present.     Mental Status: He is alert and oriented to person, place, and time.  Psychiatric:        Mood and Affect: Mood normal.        Behavior: Behavior normal.    Lab Results:  Results for orders placed or performed in visit on 04/10/21 (from the past 24 hour(s))  Urinalysis, Routine w reflex microscopic     Status: Abnormal   Collection Time: 04/10/21  3:18 PM  Result Value Ref Range   Specific Gravity, UA 1.025 1.005 - 1.030   pH, UA 5.5 5.0 - 7.5   Color, UA Amber (A) Yellow   Appearance Ur Clear Clear   Leukocytes,UA Negative  Negative   Protein,UA 2+ (A) Negative/Trace    Glucose, UA Negative Negative   Ketones, UA 1+ (A) Negative   RBC, UA Negative Negative   Bilirubin, UA Negative Negative   Urobilinogen, Ur 1.0 0.2 - 1.0 mg/dL   Nitrite, UA Negative Negative   Microscopic Examination See below:    Narrative   Performed at:  [facility] [address] [place], [place]  [id number] Lab Director: Mina Marble [place], Phone:  [phone]  Microscopic Examination     Status: None   Collection Time: 04/10/21  3:18 PM   Urine  Result Value Ref Range   WBC, UA None seen 0 - 5 /hpf   RBC 0-2 0 - 2 /hpf   Epithelial Cells (non renal) None seen 0 - 10 /hpf   Renal Epithel, UA None seen None seen /hpf   Mucus, UA Present Not Estab.   Bacteria, UA None seen None seen/Few   Narrative   Performed at:  [facility] [address], [place], [place]  [id number] Lab Director: [place], Phone:  [phone]    BMET No results for input(s): NA, K, CL, CO2, GLUCOSE, BUN, CREATININE, CALCIUM in the last 72 hours. PT/INR No results for input(s): LABPROT, INR in the last 72 hours. ABG No results for input(s): PHART, HCO3 in the last 72 hours.  Invalid input(s): PCO2, PO2  Studies/Results: No results found. PVR 76ml  Assessment/Plan: BPH with BOO and OAB wet.  He has a mildly elevated PVR but with his neurologic symptoms it is difficulty to say if his symptoms are related to obstruction or possibly his associated neurologic issues.  I am going to try him on tamsulosin and will have him return in 4-6 weeks for a flowrate and PVR.  I will consider urodynamics and cystoscopy as well. I will get a PSA since he hasn't had one since 6/20 as best I can tell.    Meds ordered this encounter  Medications   tamsulosin (FLOMAX) 0.4 MG CAPS capsule    Sig: Take 1 capsule (0.4 mg total) by mouth daily.    Dispense:  30 capsule    Refill:  11      Orders Placed This Encounter  Procedures   Microscopic Examination   Urinalysis, Routine  w reflex microscopic   PSA, total and free   Bladder Scan (Post Void Residual) in office     Return in about 4 weeks (around 05/08/2021) for f/u in 4-6 weeks for a flowrate and PVR.  Marland Kitchen    CC: Dr. [name].      Irine Seal 04/10/2021 [phone]

## 2021-04-11 LAB — PSA, TOTAL AND FREE
PSA, Free Pct: 15.5 %
PSA, Free: 0.85 ng/mL
Prostate Specific Ag, Serum: 5.5 ng/mL — ABNORMAL HIGH (ref 0.0–4.0)

## 2021-04-14 ENCOUNTER — Telehealth: Payer: Self-pay

## 2021-04-14 NOTE — Telephone Encounter (Signed)
Message left for patient to return call to office

## 2021-04-14 NOTE — Telephone Encounter (Signed)
-----   Message from Irine Seal, MD sent at 04/11/2021  2:33 PM EDT ----- His PSA is up to 5.5 from 3.0 over the past year and the free to total ratio is low.   I see he is scheduled to see me in November.  Please see if we could move him up to mid October to discuss his response to the tamsulosin and for me to review the PSA with him.  ----- Message ----- From: Dorisann Frames, RN Sent: 04/11/2021   8:52 AM EDT To: Irine Seal, MD  Please review

## 2021-04-15 ENCOUNTER — Other Ambulatory Visit: Payer: Self-pay | Admitting: Family Medicine

## 2021-04-15 ENCOUNTER — Other Ambulatory Visit (HOSPITAL_COMMUNITY): Payer: Self-pay | Admitting: Family Medicine

## 2021-04-15 DIAGNOSIS — G8191 Hemiplegia, unspecified affecting right dominant side: Secondary | ICD-10-CM

## 2021-04-16 DIAGNOSIS — E669 Obesity, unspecified: Secondary | ICD-10-CM | POA: Diagnosis not present

## 2021-04-16 DIAGNOSIS — G4733 Obstructive sleep apnea (adult) (pediatric): Secondary | ICD-10-CM | POA: Diagnosis not present

## 2021-04-16 DIAGNOSIS — R278 Other lack of coordination: Secondary | ICD-10-CM | POA: Diagnosis not present

## 2021-04-16 DIAGNOSIS — G8190 Hemiplegia, unspecified affecting unspecified side: Secondary | ICD-10-CM | POA: Diagnosis not present

## 2021-04-17 NOTE — Progress Notes (Signed)
Pt called in today and said that he spoke to his PCP (Dr. Karie Kirks).  He said that he was advised to hold off on having colonoscopy for awhile.  Routing to [employer], PA-C as Juluis Rainier.

## 2021-04-18 NOTE — Progress Notes (Signed)
Noted  

## 2021-04-28 ENCOUNTER — Ambulatory Visit (HOSPITAL_COMMUNITY)
Admission: RE | Admit: 2021-04-28 | Discharge: 2021-04-28 | Disposition: A | Discharge status: Home / Self Care | Payer: BC Managed Care – PPO | Source: Ambulatory Visit | Attending: Family Medicine | Admitting: Family Medicine

## 2021-04-28 ENCOUNTER — Other Ambulatory Visit: Payer: Self-pay

## 2021-04-28 DIAGNOSIS — M4802 Spinal stenosis, cervical region: Secondary | ICD-10-CM | POA: Diagnosis not present

## 2021-04-28 DIAGNOSIS — M4803 Spinal stenosis, cervicothoracic region: Secondary | ICD-10-CM | POA: Diagnosis not present

## 2021-04-28 DIAGNOSIS — G8191 Hemiplegia, unspecified affecting right dominant side: Secondary | ICD-10-CM | POA: Diagnosis not present

## 2021-04-28 DIAGNOSIS — M47812 Spondylosis without myelopathy or radiculopathy, cervical region: Secondary | ICD-10-CM | POA: Diagnosis not present

## 2021-04-28 IMAGING — MR MR CERVICAL SPINE W/O CM
5 series · 36 of 48 positions shown · non-contrast
Comparison: Cervical spine radiographs [DATE].

CLINICAL DATA: Hemiplegia affecting right dominant side,
unspecified etiology, unspecified hemiplegia type (HCC) [O8]
([O8]-CM)

EXAM:
MRI CERVICAL SPINE WITHOUT CONTRAST
TECHNIQUE: Multiplanar, multisequence MR imaging of the cervical spine was
performed. No intravenous contrast was administered.

[Series 5: T2 · sagittal · 3.0mm · 0.69mm/px · 6 of 13 slices shown (1 of 2)]
[im 1/13]
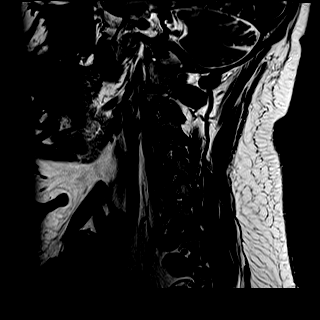
[im 3/13]
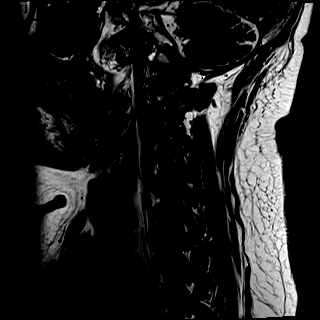
[im 5/13]
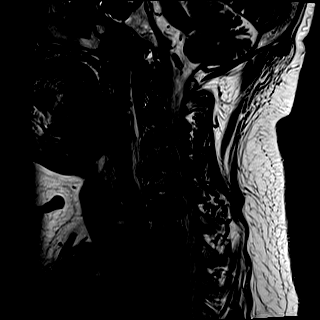
[im 8/13]
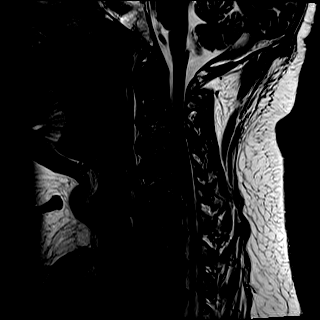
[im 10/13]
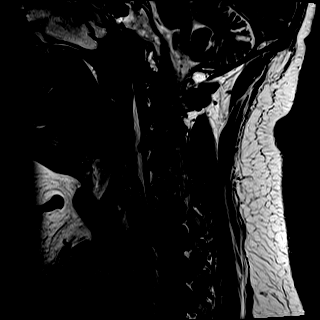
[im 13/13]
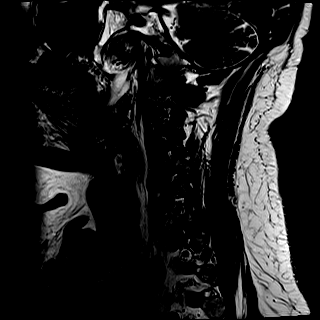

[Series 6: T1 · sagittal · 3.0mm · 0.86mm/px · 5 of 13 slices shown]
[im 1/13]
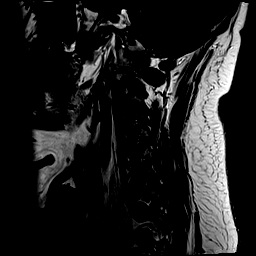
[im 4/13]
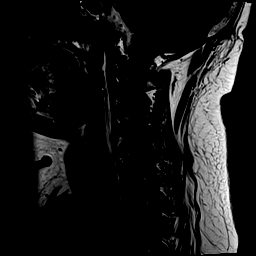
[im 7/13]
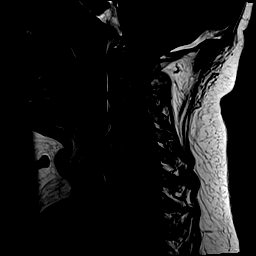
[im 10/13]
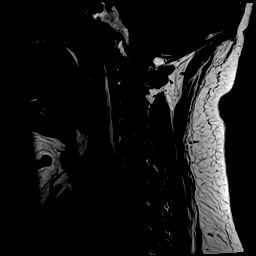
[im 13/13]
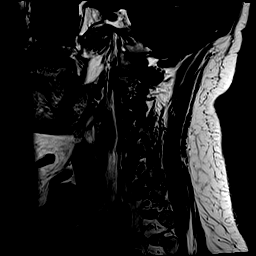

[Series 7: STIR · sagittal · 3.0mm · 0.69mm/px · 5 of 13 slices shown]
[im 1/13]
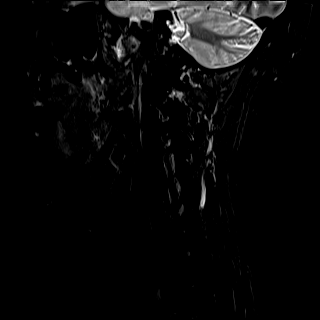
[im 4/13]
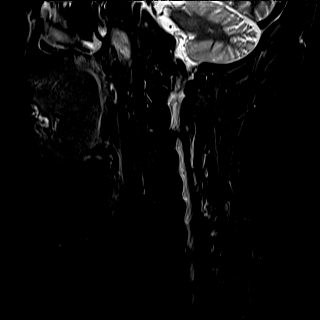
[im 7/13]
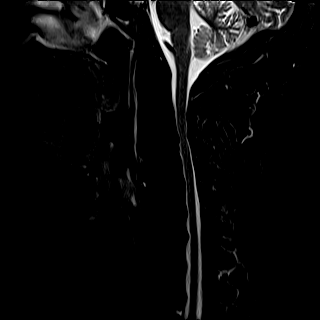
[im 10/13]
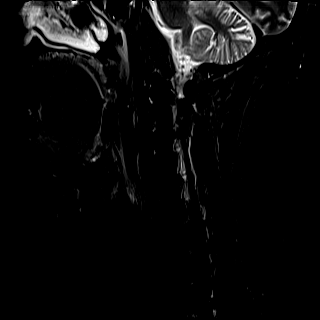
[im 13/13]
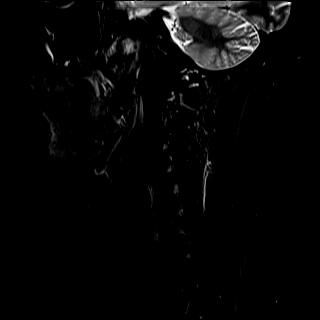

[Series 8: T2 · axial · 3.0mm · 0.70mm/px · z∈[-159,-20]mm · 12 of 42 slices shown (2 of 2)]
[im 1/42]
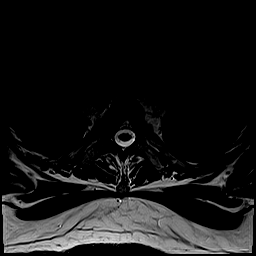
[im 3/42]
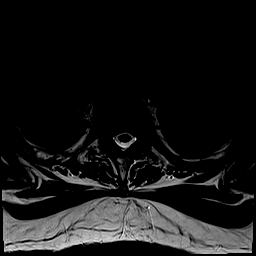
[im 6/42]
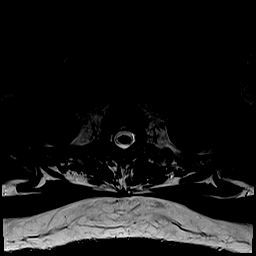
[im 9/42]
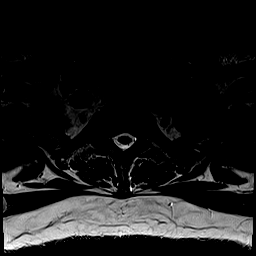
[im 11/42]
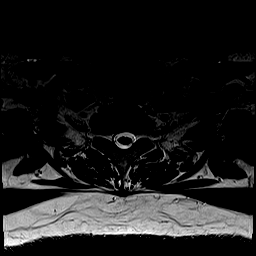
[im 14/42]
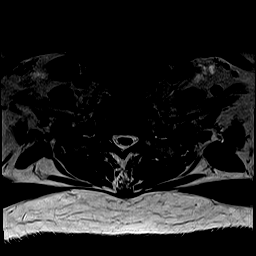
[im 20/42]
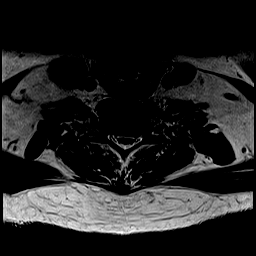
[im 22/42]
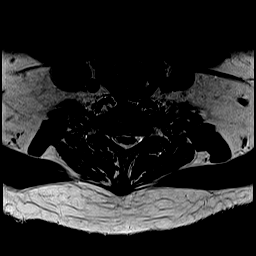
[im 25/42]
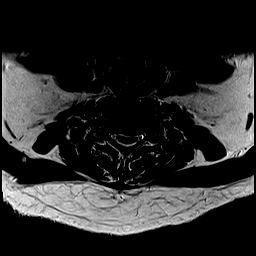
[im 31/42]
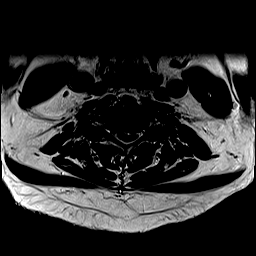
[im 36/42]
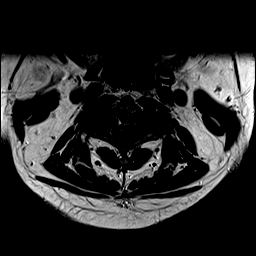
[im 42/42]
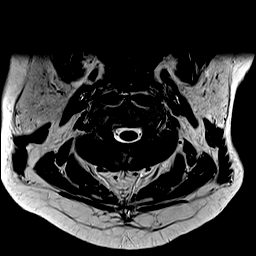

[Series 9: GRE · axial · 3.0mm · 0.35mm/px · z∈[-152,-20]mm · 8 of 42 slices shown]
[im 3/42]
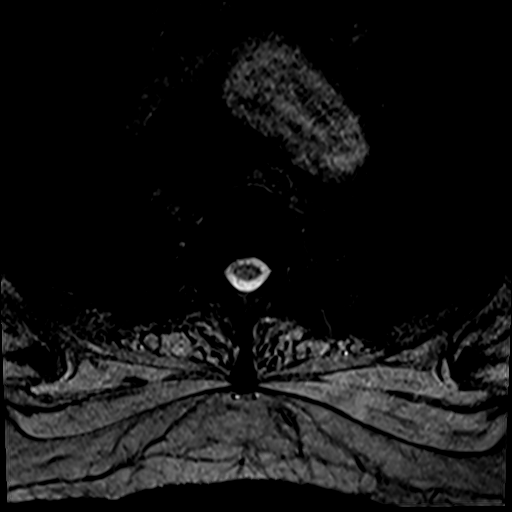
[im 9/42]
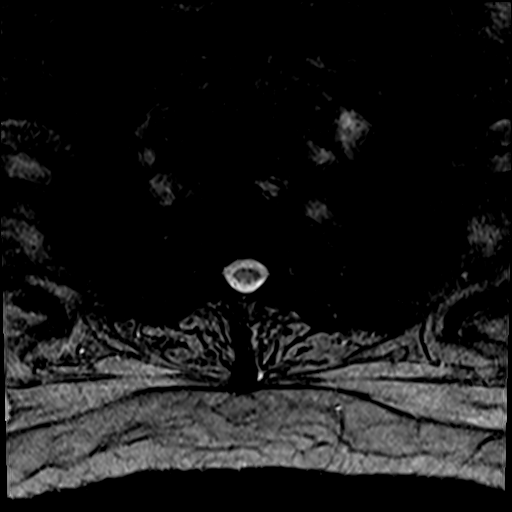
[im 14/42]
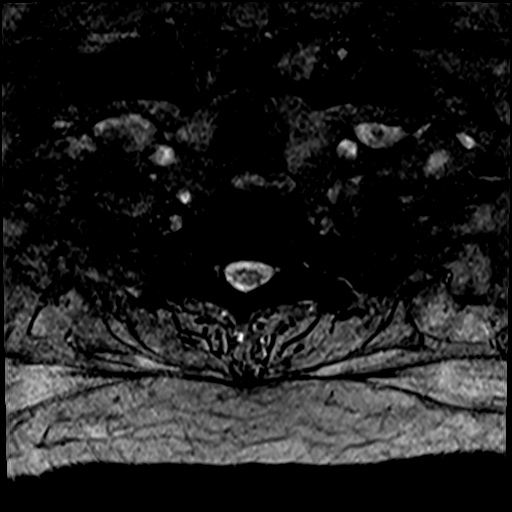
[im 20/42]
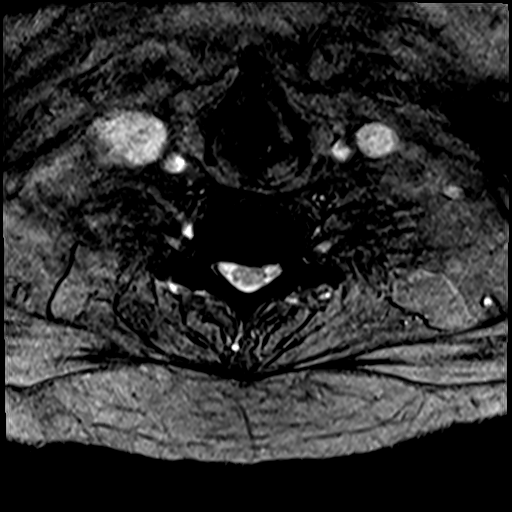
[im 25/42]
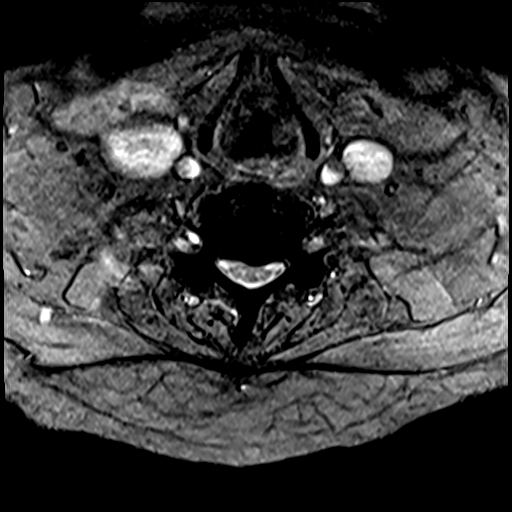
[im 31/42]
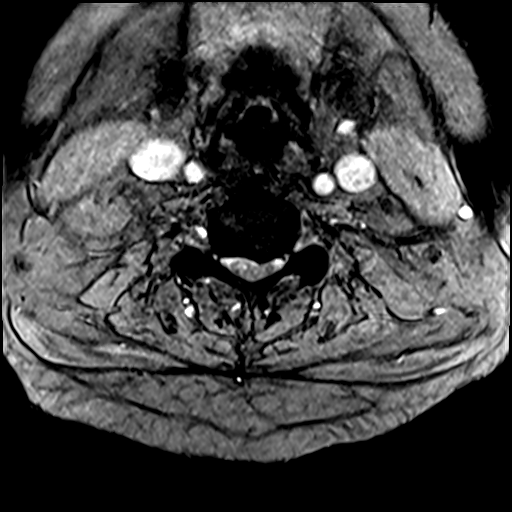
[im 36/42]
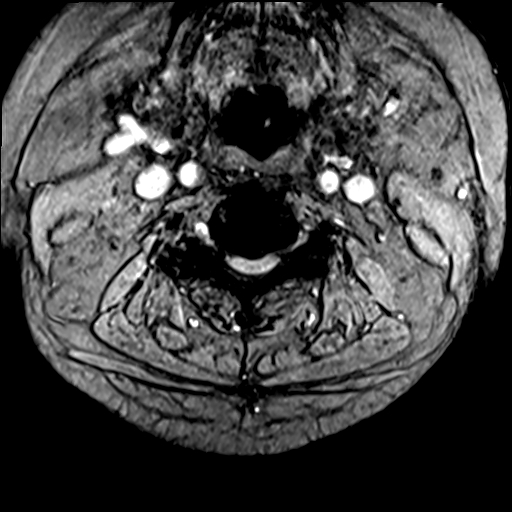
[im 42/42]
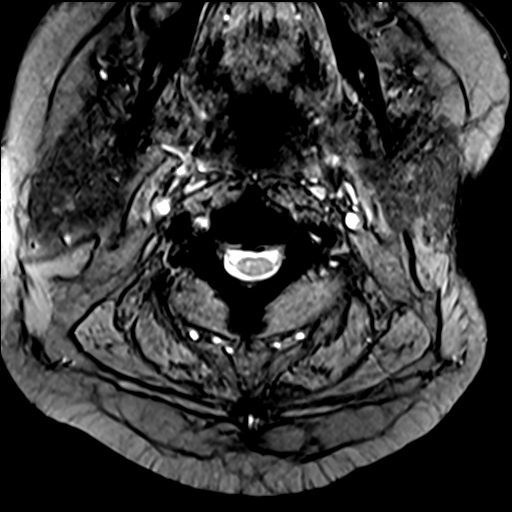

[36 of 48 positions shown; findings below may reference images not displayed]

FINDINGS: Alignment: Straightening of the normal cervical lordosis. No
substantial sagittal subluxation.

Vertebrae: Vertebral body heights are maintained. No focal marrow
edema to suggest acute fracture discitis/osteomyelitis.
Heterogeneous bone marrow without suspicious bone lesion.

Cord: Focal T2 hyperintensity in the cord at C3-C4 at the level of
stenosis described below.

Posterior Fossa, vertebral arteries, paraspinal tissues: Poor
visualization of the left vertebral artery flow void.

Disc levels:

C2-C3: Posterior disc osteophyte complex with right greater than
left facet and uncovertebral hypertrophy. Mild right foraminal
stenosis and mild-to-moderate canal stenosis.

C3-C4: Posterior disc osteophyte complex ligamentum flavum
thickening and bilateral facet uncovertebral hypertrophy. Resulting
severe canal stenosis and severe bilateral foraminal stenosis.

C4-C5: Posterior disc osteophyte complex with bilateral
uncovertebral hypertrophy. Posterior disc contacts and deforms the
left ventral cord with overall mild canal stenosis. Moderate
bilateral foraminal stenosis.

C5-C6: Posterior disc osteophyte complex with left paracentral disc
contacting and deforming the cord. Overall mild canal stenosis.
Bilateral facet uncovertebral hypertrophy with moderate moderate to
severe right and moderate left foraminal stenosis.

C6-C7: Posterior disc osteophyte complex with bilateral
uncovertebral hypertrophy. Resulting moderate to severe right and
moderate left foraminal stenosis. No significant canal stenosis.

C7-T1: Posterior disc osteophyte complex with bilateral facet
uncovertebral hypertrophy. Resulting moderate to severe bilateral
foraminal stenosis. No significant canal stenosis.
IMPRESSION: 1. At C3-C4, severe canal stenosis with severe right greater than
left foraminal stenosis. T2 hyperintensity within the cord at this
level could represent edema and/or myelomalacia.
2. Moderate to severe foraminal stenosis on the right at C5-C6 and
C6-C7 and bilaterally at C7-T1. Moderate foraminal stenosis
bilaterally at C4-C5 and on the left at C5-C6 and C6-C7.
3. Mild-to-moderate canal stenosis at C2-C3. Mild canal stenosis
C4-C5 and C5-C6 where disc contacts and flattens the ventral cord.
4. Poor visualization of the left vertebral artery flow void. This
could relate to a small/non dominant left vertebral artery; however,
superimposed stenosis or occlusion is difficult to exclude. A CTA
neck could further evaluate.

These results will be called to the ordering clinician or
representative by the Radiologist Assistant, and communication
documented in the PACS or [REDACTED].

## 2021-04-29 DIAGNOSIS — G8190 Hemiplegia, unspecified affecting unspecified side: Secondary | ICD-10-CM | POA: Diagnosis not present

## 2021-04-29 DIAGNOSIS — I10 Essential (primary) hypertension: Secondary | ICD-10-CM | POA: Diagnosis not present

## 2021-04-29 DIAGNOSIS — G629 Polyneuropathy, unspecified: Secondary | ICD-10-CM | POA: Diagnosis not present

## 2021-05-01 ENCOUNTER — Other Ambulatory Visit: Payer: Self-pay

## 2021-05-01 ENCOUNTER — Encounter: Payer: Self-pay | Admitting: Urology

## 2021-05-01 ENCOUNTER — Ambulatory Visit: Payer: BC Managed Care – PPO | Admitting: Urology

## 2021-05-01 VITALS — BP 156/70 | HR 98

## 2021-05-01 DIAGNOSIS — R972 Elevated prostate specific antigen [PSA]: Secondary | ICD-10-CM | POA: Diagnosis not present

## 2021-05-01 DIAGNOSIS — R3915 Urgency of urination: Secondary | ICD-10-CM | POA: Diagnosis not present

## 2021-05-01 DIAGNOSIS — R350 Frequency of micturition: Secondary | ICD-10-CM

## 2021-05-01 DIAGNOSIS — N3941 Urge incontinence: Secondary | ICD-10-CM

## 2021-05-01 LAB — BLADDER SCAN AMB NON-IMAGING: Scan Result: 41

## 2021-05-01 MED ORDER — LEVOFLOXACIN 750 MG PO TABS: ORAL_TABLET | ORAL | 0 refills | Status: DC

## 2021-05-01 NOTE — Progress Notes (Signed)
Subjective: 1. Urgency of urination   2. Urinary frequency   3. Urge incontinence   4. Elevated PSA      Consult requested by Dr. [name].   04/10/21: Andrew Arnold is a 65 yo male who is sent for evaluation of voiding symptoms.   He has about a 3 month history  frequency q1-2hrs but his main compaint is nocturia 3+ x nightly.  He has some urgency and with incontinence and is wearing depends.  He has a good stream and feels he empties.   He has no dysuria and no hematuria.  He has no other GU surgery.  His symptoms are not impacted by the HCTZ.  He has not been treated for his symptoms.  He had a PSA that was 3 in 6/20.   He has some RUE numbness and RLE weakness for 3 months and is seeing neurology for that.  He has some dizziness in the AM.  He had a normal MRI of the head on 04/01/21.   He had some lumbar DDD on a recent spine series.  His UA has 0-2 RBC's and his PVR is 75ml.    05/01/21: Daquarius returns today in f/u.   He was given tamsulosin for his LUTS.  He still has nocturia x 3 and reports frequency every 2 hours and he has urgency.  He wears depends but is generally able to get to the bathroom before leaking.   His IPSS today is only 3.   His PVR was 34ml.   He had  Cervical MRI on 10/17 that shows cervical spinal stenosis and DDD.   He had a repeat PSA on 04/10/21 and it was elevated at 5.5 with a 15.5 % f/t ratio.   ROS:  ROS  No Known Allergies  Past Medical History:  Diagnosis Date   Allergic rhinitis due to pollen    on allergy shots   Arrhythmia    1980s hospitalized for HTN and ?irregular rhythm but no specific dx but resolved during hospitalization   HTN (hypertension)    Hyperlipidemia     Past Surgical History:  Procedure Laterality Date   colonoscopy  2005   Dr. Gala Romney: normal rectum, sigmoid diverticula in colonic mucosa   COLONOSCOPY N/A 02/15/2014   Procedure: COLONOSCOPY;  Surgeon: Daneil Dolin, MD;  Location: [facility];  Service: Endoscopy;  Laterality:  N/A;  2:00    Social History   Socioeconomic History   Marital status: Single    Spouse name: Not on file   Number of children: Not on file   Years of education: Not on file   Highest education level: Not on file  Occupational History   Occupation: school counselor  Tobacco Use   Smoking status: Never   Smokeless tobacco: Never  Vaping Use   Vaping Use: Never used  Substance and Sexual Activity   Alcohol use: No   Drug use: No   Sexual activity: Yes  Other Topics Concern   Not on file  Social History [place], [place]   Lives in [place] Determinants of Health   Financial Resource Strain: Not on file  Food Insecurity: Not on file  Transportation Needs: Not on file  Physical Activity: Not on file  Stress: Not on file  Social Connections: Not on file  Intimate Partner Violence: Not on file    Family History  Problem Relation Age  of Onset   Cancer Mother    Cirrhosis Father        cirrhosis of the liver; alcohol related   Other Other        neice was on allergy vaccine when young   Colon cancer Neg Hx     Anti-infectives: Anti-infectives (From admission, onward)    Start     Dose/Rate Route Frequency Ordered Stop   05/01/21 0000  levofloxacin (LEVAQUIN) 750 MG tablet           05/01/21 1541         Current Outpatient Medications  Medication Sig Dispense Refill   ascorbic acid (VITAMIN C) 500 MG tablet Take 500 mg by mouth every morning.      aspirin 81 MG tablet Take 81 mg by mouth every morning.      B Complex Vitamins (B COMPLEX 100 PO) Take 1 tablet by mouth every morning.      Cetirizine HCl 10 MG CAPS Take 1 capsule by mouth 2 (two) times a day.      cloNIDine (CATAPRES) 0.1 MG tablet Take 0.1 mg by mouth 2 (two) times daily.     cloNIDine (CATAPRES) 0.2 MG tablet Take 0.2 mg by mouth 3 (three) times daily.     hydrochlorothiazide (HYDRODIURIL) 25 MG tablet Take 25 mg by mouth daily.      levofloxacin (LEVAQUIN) 750 MG tablet Take 1 tablet 1 hour prior to the procedure. 1 tablet 0   meloxicam (MOBIC) 15 MG tablet Take 15 mg by mouth daily.     metFORMIN (GLUCOPHAGE) 1000 MG tablet Take by mouth 2 (two) times daily.     Na Sulfate-K Sulfate-Mg Sulf 17.5-3.13-1.6 GM/177ML SOLN Take by mouth once.     potassium chloride (K-DUR) 10 MEQ tablet Take 1 tablet by mouth daily.  1   tamsulosin (FLOMAX) 0.4 MG CAPS capsule Take 1 capsule (0.4 mg total) by mouth daily. 30 capsule 11   No current facility-administered medications for this visit.     Objective: Vital signs in last 24 hours: BP (!) 156/70   Pulse 98   Intake/Output from previous day: No intake/output data recorded. Intake/Output this shift: @IOTHISSHIFT @   Physical Exam  Lab Results:  Recent Results (from the past 2160 hour(s))  Urinalysis, Routine w reflex microscopic     Status: Abnormal   Collection Time: 04/10/21  3:18 PM  Result Value Ref Range   Specific Gravity, UA 1.025 1.005 - 1.030   pH, UA 5.5 5.0 - 7.5   Color, UA Amber (A) Yellow   Appearance Ur Clear Clear   Leukocytes,UA Negative Negative   Protein,UA 2+ (A) Negative/Trace   Glucose, UA Negative Negative   Ketones, UA 1+ (A) Negative   RBC, UA Negative Negative   Bilirubin, UA Negative Negative   Urobilinogen, Ur 1.0 0.2 - 1.0 mg/dL   Nitrite, UA Negative Negative   Microscopic Examination See below:   Microscopic Examination     Status: None   Collection Time: 04/10/21  3:18 PM   Urine  Result Value Ref Range   WBC, UA None seen 0 - 5 /hpf   RBC 0-2 0 - 2 /hpf   Epithelial Cells (non renal) None seen 0 - 10 /hpf   Renal Epithel, UA None seen None seen /hpf   Mucus, UA Present Not Estab.   Bacteria, UA None seen None seen/Few  Bladder Scan (Post Void Residual) in office     Status: None  Collection Time: 04/10/21  3:37 PM  Result Value Ref Range   Scan Result 84   PSA, total and free     Status: Abnormal   Collection Time:  04/10/21  4:25 PM  Result Value Ref Range   Prostate Specific Ag, Serum 5.5 (H) 0.0 - 4.0 ng/mL    Comment: Roche ECLIA methodology. According to the American Urological Association, Serum PSA should decrease and remain at undetectable levels after radical prostatectomy. The AUA defines biochemical recurrence as an initial PSA value 0.2 ng/mL or greater followed by a subsequent confirmatory PSA value 0.2 ng/mL or greater. Values obtained with different assay methods or kits cannot be used interchangeably. Results cannot be interpreted as absolute evidence of the presence or absence of malignant disease.    PSA, Free 0.85 N/A ng/mL    Comment: Roche ECLIA methodology.   PSA, Free Pct 15.5 %    Comment: The table below lists the probability of prostate cancer for men with non-suspicious DRE results and total PSA between 4 and 10 ng/mL, by patient age Ricci Barker, [place], [id number]).                   % Free PSA       50-64 yr        65-75 yr                   0.00-10.00%        56%             55%                  10.01-15.00%        24%             35%                  15.01-20.00%        17%             23%                  20.01-25.00%        10%             20%                       >25.00%         5%              9% Please note:  Catalona et al did not make specific               recommendations regarding the use of               percent free PSA for any other population               of men.   BLADDER SCAN AMB NON-IMAGING     Status: None   Collection Time: 05/01/21  3:25 PM  Result Value Ref Range   Scan Result 41       BMET No results for input(s): NA, K, CL, CO2, GLUCOSE, BUN, CREATININE, CALCIUM in the last 72 hours. PT/INR No results for input(s): LABPROT, INR in the last 72 hours. ABG No results for input(s): PHART, HCO3 in the last 72 hours.  Invalid input(s): PCO2, PO2  Studies/Results: No results found. PVR 3ml  DG Cervical Spine  Complete  Result Date: 03/08/2021 CLINICAL DATA:  Numbness  on right side. Patient reports right-sided numbness from neck down to leg. EXAM: CERVICAL SPINE - COMPLETE 4+ VIEW COMPARISON:  None. FINDINGS: Straightening of normal lordosis. No listhesis. Large anterior spurs from C2-C3 through C4-C5, with lesser spurring in the lower cervical spine. There is mild diffuse disc space narrowing. Multilevel facet hypertrophy. Mild multilevel right neural foraminal narrowing, most prominent at C5-C6. no evidence of fracture, focal bone lesion or bone destruction. No prevertebral soft tissue edema. IMPRESSION: Multilevel degenerative disc disease and facet hypertrophy with large anterior osteophytes from C2-C3 through C4-C5. Mild multilevel right neural foraminal narrowing, most prominent at C5-C6. Electronically Signed   By: Keith Rake M.D.   On: 03/08/2021 13:27   DG Lumbar Spine Complete  Result Date: 03/08/2021 CLINICAL DATA:  Numbness on right side. Patient reports numbness from neck to leg. EXAM: LUMBAR SPINE - COMPLETE 4+ VIEW COMPARISON:  Lumbar radiograph 08/14/2009 FINDINGS: There are 5 lumbar type vertebra. Minimal broad-based rightward curvature, no listhesis. Diffuse degenerative disc disease with disc space narrowing and endplate spurring, most prominent at L4-L5 and L5-S1. There is diffuse facet hypertrophy. Degenerative changes have progressed from prior exam. Vertebral body heights are normal. No evidence of fracture, focal bone lesion or bone destruction. The sacroiliac joints are congruent. IMPRESSION: Multilevel degenerative disc disease and facet hypertrophy throughout the lumbar spine, most prominent at L4-L5 and L5-S1, progressed from prior exam. Electronically Signed   By: Keith Rake M.D.   On: 03/08/2021 13:29   MR BRAIN WO CONTRAST  Result Date: 04/02/2021 CLINICAL DATA:  Weakness EXAM: MRI HEAD WITHOUT CONTRAST TECHNIQUE: Multiplanar, multiecho pulse sequences of the brain and  surrounding structures were obtained without intravenous contrast. COMPARISON:  No prior MRI, correlation is made with CT head 11/12/2013 FINDINGS: Brain: No acute infarction, hemorrhage, hydrocephalus, extra-axial collection or mass lesion. Scattered T2 hyperintense signal in the periventricular white matter, likely the sequela of chronic small vessel ischemic disease. Vascular: Normal flow voids. Skull and upper cervical spine: Normal marrow signal. Sinuses/Orbits: Mucous retention cysts in the right maxillary sinus and left ethmoid air cells. The orbits are unremarkable. Other: The mastoids are well aerated. IMPRESSION: No acute intracranial process. No etiology is seen for the patient's weakness. Electronically Signed   By: Merilyn Baba M.D.   On: 04/02/2021 02:48   MR CERVICAL SPINE WO CONTRAST  Result Date: 04/29/2021 CLINICAL DATA:  Hemiplegia affecting right dominant side, unspecified etiology, unspecified hemiplegia type ([place]) G81.91 (ICD-10-CM) EXAM: MRI CERVICAL SPINE WITHOUT CONTRAST TECHNIQUE: Multiplanar, multisequence MR imaging of the cervical spine was performed. No intravenous contrast was administered. COMPARISON:  Cervical spine radiographs 03/07/2021. FINDINGS: Alignment: Straightening of the normal cervical lordosis. No substantial sagittal subluxation. Vertebrae: Vertebral body heights are maintained. No focal marrow edema to suggest acute fracture discitis/osteomyelitis. Heterogeneous bone marrow without suspicious bone lesion. Cord: Focal T2 hyperintensity in the cord at C3-C4 at the level of stenosis described below. Posterior Fossa, vertebral arteries, paraspinal tissues: Poor visualization of the left vertebral artery flow void. Disc levels: C2-C3: Posterior disc osteophyte complex with right greater than left facet and uncovertebral hypertrophy. Mild right foraminal stenosis and mild-to-moderate canal stenosis. C3-C4: Posterior disc osteophyte complex ligamentum flavum thickening  and bilateral facet uncovertebral hypertrophy. Resulting severe canal stenosis and severe bilateral foraminal stenosis. C4-C5: Posterior disc osteophyte complex with bilateral uncovertebral hypertrophy. Posterior disc contacts and deforms the left ventral cord with overall mild canal stenosis. Moderate bilateral foraminal stenosis. C5-C6: Posterior disc osteophyte complex with left paracentral disc contacting and deforming  the cord. Overall mild canal stenosis. Bilateral facet uncovertebral hypertrophy with moderate moderate to severe right and moderate left foraminal stenosis. C6-C7: Posterior disc osteophyte complex with bilateral uncovertebral hypertrophy. Resulting moderate to severe right and moderate left foraminal stenosis. No significant canal stenosis. C7-T1: Posterior disc osteophyte complex with bilateral facet uncovertebral hypertrophy. Resulting moderate to severe bilateral foraminal stenosis. No significant canal stenosis. IMPRESSION: 1. At C3-C4, severe canal stenosis with severe right greater than left foraminal stenosis. T2 hyperintensity within the cord at this level could represent edema and/or myelomalacia. 2. Moderate to severe foraminal stenosis on the right at C5-C6 and C6-C7 and bilaterally at C7-T1. Moderate foraminal stenosis bilaterally at C4-C5 and on the left at C5-C6 and C6-C7. 3. Mild-to-moderate canal stenosis at C2-C3. Mild canal stenosis C4-C5 and C5-C6 where disc contacts and flattens the ventral cord. 4. Poor visualization of the left vertebral artery flow void. This could relate to a small/non dominant left vertebral artery; however, superimposed stenosis or occlusion is difficult to exclude. A CTA neck could further evaluate. These results will be called to the ordering clinician or representative by the Radiologist Assistant, and communication documented in the PACS or [employer]. Electronically Signed   By: Margaretha Sheffield M.D.   On: 04/29/2021 16:48      Assessment/Plan: BPH with BOO and OAB wet.  His PVR is a bit better with the tamsulosin and his symptoms seem improved.   His MRI showed cervical stenosis and disc disease which may be contributing to his neurologic symptoms.  I will just have him stay on tamsulosin for now and f/u in 3 months.  He may need treatment of the cervical issues and I would want to see how he responds to that prior to further evaluation.   Elevated PSA.   His PSA is up with a low f/t ratio and he needs a biopsy.  I have reviewed the risks of bleeding, infection and voiding difficulty.   I will send levaquin and get him scheduled.  Meds ordered this encounter  Medications   levofloxacin (LEVAQUIN) 750 MG tablet    Sig: Take 1 tablet 1 hour prior to the procedure.    Dispense:  1 tablet    Refill:  0       Orders Placed This Encounter  Procedures   [place] PROSTATE BIOPSY MULTIPLE    Standing Status:   Future    Standing Expiration Date:   08/01/2021    Order Specific Question:   Reason for Exam (SYMPTOM  OR DIAGNOSIS REQUIRED)    Answer:   elevated PSA    Order Specific Question:   Preferred location?    Answer:   [facility]   US Guided Needle Placement    Standing Status:   Future    Standing Expiration Date:   05/01/2022    Order Specific Question:   Reason for Exam (SYMPTOM  OR DIAGNOSIS REQUIRED)    Answer:   elevated psa    Order Specific Question:   Preferred imaging location?    Answer:   [facility]   [place] Transrectal Complete    Standing Status:   Future    Standing Expiration Date:   05/01/2022    Order Specific Question:   Reason for Exam (SYMPTOM  OR DIAGNOSIS REQUIRED)    Answer:   elevated psa    Order Specific Question:   Preferred imaging location?    Answer:   [facility]   Urinalysis, Routine w  reflex microscopic   BLADDER SCAN AMB NON-IMAGING     Return in about 3 months (around 08/01/2021).    CC: Dr. [name].      Irine Seal 05/01/2021 [phone] Patient ID: [name], male   DOB: [birth year], 64 y.o.   MRN: [id number]

## 2021-05-01 NOTE — Progress Notes (Signed)
post void residual=41  Urological Symptom Review  Patient is experiencing the following symptoms: Frequent urination   Review of Systems  Gastrointestinal (upper)  : Negative for upper GI symptoms  Gastrointestinal (lower) : Negative for lower GI symptoms  Constitutional : Negative for symptoms  Skin: Negative for skin symptoms  Eyes: Negative for eye symptoms  Ear/Nose/Throat : Negative for Ear/Nose/Throat symptoms  Hematologic/Lymphatic: Negative for Hematologic/Lymphatic symptoms  Cardiovascular : Negative for cardiovascular symptoms  Respiratory : Negative for respiratory symptoms  Endocrine: Negative for endocrine symptoms  Musculoskeletal: Negative for musculoskeletal symptoms  Neurological: Negative for neurological symptoms  Psychologic: Negative for psychiatric symptoms

## 2021-05-01 NOTE — Patient Instructions (Signed)
   Appointment Time: arrive 8am Appointment Date: November 3rd  Location: [facility] Radiology Department   Prostate Biopsy Instructions  Stop all aspirin or blood thinners (aspirin, plavix, coumadin, warfarin, motrin, ibuprofen, advil, aleve, naproxen, naprosyn) for 7 days prior to the procedure.  If you have any questions about stopping these medications, please contact your primary care physician or cardiologist.  Having a light meal prior to the procedure is recommended.  If you are diabetic or have low blood sugar please bring a small snack or glucose tablet.  A Fleets enema is needed to be purchased over the counter at a local pharmacy and used 2 hours before you scheduled appointment.  This can be purchased over the counter at any pharmacy.  Antibiotics will be administered in the clinic at the time of the procedure and 1 tablet has been sent to your pharmacy. Please take the antibiotic as prescribed.    Please bring someone with you to the procedure to drive you home if you are given a valium to take prior to your procedure.   If you have any questions or concerns, please feel free to call the office at [phone] or send a Mychart message.    Thank you, [facility]

## 2021-05-07 ENCOUNTER — Ambulatory Visit (HOSPITAL_COMMUNITY): Payer: BC Managed Care – PPO

## 2021-05-08 ENCOUNTER — Ambulatory Visit (HOSPITAL_COMMUNITY): Payer: BC Managed Care – PPO | Attending: Neurology | Admitting: Physical Therapy

## 2021-05-08 ENCOUNTER — Encounter (HOSPITAL_COMMUNITY): Payer: Self-pay | Admitting: Physical Therapy

## 2021-05-08 ENCOUNTER — Other Ambulatory Visit: Payer: Self-pay

## 2021-05-08 DIAGNOSIS — R262 Difficulty in walking, not elsewhere classified: Secondary | ICD-10-CM | POA: Insufficient documentation

## 2021-05-08 DIAGNOSIS — M6281 Muscle weakness (generalized): Secondary | ICD-10-CM | POA: Insufficient documentation

## 2021-05-08 NOTE — Therapy (Signed)
[place], [place], [address] Phone: [phone]   Fax:  [phone]  Physical Therapy Evaluation  Patient Details  Name: Andrew Arnold MRN: [id number] Date of Birth: 1955/09/09 Referring Provider (PT): Phillips Odor, MD   Encounter Date: 05/08/2021   PT End of Session - 05/08/21 1528     Visit Number 1    Number of Visits 6    Date for PT Re-Evaluation 07/03/21    Authorization Type [employer] VL 30, no auth    Authorization - Visit Number 1    Authorization - Number of Visits 30    Progress Note Due on Visit 10    PT Start Time 1449    PT Stop Time 1525    PT Time Calculation (min) 36 min    Activity Tolerance Patient tolerated treatment well    Behavior During Therapy [facility] for tasks assessed/performed             Past Medical History:  Diagnosis Date   Allergic rhinitis due to pollen    on allergy shots   Arrhythmia    1980s hospitalized for HTN and ?irregular rhythm but no specific dx but resolved during hospitalization   HTN (hypertension)    Hyperlipidemia     Past Surgical History:  Procedure Laterality Date   colonoscopy  2005   Dr. Gala Romney: normal rectum, sigmoid diverticula in colonic mucosa   COLONOSCOPY N/A 02/15/2014   Procedure: COLONOSCOPY;  Surgeon: Daneil Dolin, MD;  [facility];  Service: Endoscopy;  Laterality: N/A;  2:00    There were no vitals filed for this visit.    Subjective Assessment - 05/08/21 1459     Subjective States that he has been having issues on his right side (arm and leg) and this started in June of this year. States he had foot fungus and he had itching across the top of the foot. States that he has since seen a few doctors, some for his back and some for his neck.  States he went to one MD who sent him to neurology and they did an MRI which he hasn't gotten the results back yet as his MD has been out. States they are sending him now to  neurosurgery but he hasn't been scheduled for that yet. Patient has had the run around with all his doctors. States that he had recently started taking prednisone for his symptoms. Reports his right leg is his biggest concern.    Limitations Standing;Walking;House hold activities    Patient Stated Goals to not drag his foot/right leg                [facility] PT Assessment - 05/08/21 0001       Assessment   Medical Diagnosis lack of coordination    Referring Provider (PT) Phillips Odor, MD    Next MD Visit 05/28/21      Balance Screen   Has the patient fallen in the past 6 months No      Cognition   Overall Cognitive Status Within Functional Limits for tasks assessed      Observation/Other Assessments   Observations pitting edema 4+ in bilateral lower legs    Focus on Therapeutic Outcomes (FOTO)  NA      ROM / Strength   AROM / PROM / Strength AROM;Strength      Strength   Strength Assessment Site Hip;Knee;Ankle    Right/Left Hip Left;Right  Right Hip Flexion 2/5    Right Hip Extension 2/5    Left Hip Flexion 4-/5    Left Hip Extension 3+/5    Right/Left Knee Left;Right    Right Knee Flexion 2+/5    Right Knee Extension 3-/5    Left Knee Extension 4+/5    Right/Left Ankle Left;Right    Right Ankle Dorsiflexion 3-/5    Left Ankle Dorsiflexion 4+/5      Ambulation/Gait   Ambulation/Gait Yes    Ambulation/Gait Assistance 5: Supervision;6: Modified independent (Device/Increase time)    Ambulation Distance (Feet) 356 Feet    Gait Pattern Right circumduction;Decreased weight shift to right;Decreased dorsiflexion - right;Decreased hip/knee flexion - right;Decreased stance time - right;Lateral trunk lean to left    Ambulation Surface Level;Indoor    Gait velocity decreased    Gait Comments 2MW      Balance   Balance Assessed Yes      Static Standing Balance   Static Standing - Balance Support No upper extremity supported    Static Standing - Level of Assistance 5:  Stand by assistance    Static Standing Balance -  Activities  Single Leg Stance - Left Leg;Single Leg Stance - Right Leg    Static Standing - Comment/# of Minutes unable on either side - can't balance on right and can't pick up right leg to balance on left                        Objective measurements completed on examination: See above findings.       [facility] Adult PT Treatment/Exercise - 05/08/21 0001       Exercises   Exercises Knee/Hip      Knee/Hip Exercises: Seated   Long Arc Quad 10 reps;Left   5" holds                    PT Education - 05/08/21 1500     Education Details on current condition, on presentaiton, on HEP, on POC    Person(s) Educated Patient    Methods Explanation    Comprehension Verbalized understanding              PT Short Term Goals - 05/08/21 1528       PT SHORT TERM GOAL #1   Title Patient will be independent in self management strategies to improve quality of life and functional outcomes.    Time 4    Period Weeks    Status New    Target Date 06/05/21      PT SHORT TERM GOAL #2   Title Patient will report at least 25% improvement in overall symptoms and/or function to demonstrate improved functional mobility    Time 4    Period Weeks    Status New    Target Date 06/05/21               PT Long Term Goals - 05/08/21 1529       PT LONG TERM GOAL #1   Title Patient will be able to demonstrate at least 3/5 strength in R LE    Time 8    Period Weeks    Status New    Target Date 07/03/21      PT LONG TERM GOAL #2   Title Patient will report at least 50% improvement in overall symptoms and/or function to demonstrate improved functional mobility    Time 8    Period Weeks  Status New    Target Date 07/03/21                    Plan - 05/08/21 1530     Clinical Impression Statement Patient is a 65 y.o. male who presents to physical therapy with complaint of right sided weakness. Patient  is currently waiting on MRI repot and call from neurosurgery, would like to develop HEP for weakness at this time. Patient demonstrates decreased strength, ROM restriction, balance deficits and gait abnormalities which are likely contributing to symptoms of pain and are negatively impacting patient ability to perform ADLs and functional mobility tasks. Patient will benefit from skilled physical therapy services to address these deficits to reduce pain, improve level of function with ADLs, functional mobility tasks, and reduce risk for falls.    Personal Factors and Comorbidities Comorbidity 1;Fitness;Age    Comorbidities possible myelomalacia and stenosis per imaging    Examination-Activity Limitations Bathing;Stand;Stairs;Transfers;Lift;Dressing    Examination-Participation Restrictions Occupation;Meal Prep;Yard Work;Community Activity;Cleaning    Stability/Clinical Decision Making Evolving/Moderate complexity    Clinical Decision Making Moderate    Rehab Potential Fair    PT Frequency Other (comment)    PT Duration 8 weeks    PT Treatment/Interventions ADLs/Self Care Home Management;Balance training;Therapeutic exercise;Therapeutic activities;Manual techniques;Neuromuscular re-education;Passive range of motion;Gait training    PT Next Visit Plan R LE Strengthenging - gravity elimiated positions needed as patient cannot lift against gravity -print all exercises our for HEP -    PT Home Exercise Plan LAQS    Consulted and Agree with Plan of Care Patient             Patient will benefit from skilled therapeutic intervention in order to improve the following deficits and impairments:  Decreased strength, Difficulty walking, Decreased mobility, Decreased balance, Decreased range of motion, Decreased knowledge of use of DME, Decreased knowledge of precautions, Decreased activity tolerance, Decreased endurance  Visit Diagnosis: Muscle weakness (generalized)  Difficulty in walking, not elsewhere  classified     Problem List Patient Active Problem List   Diagnosis Date Noted   Elevated hemoglobin A1c 04/10/2021   Chest pain 08/29/2017   Fever 08/29/2017   Acute URI 08/29/2017   Moderate dehydration 08/29/2017   Sepsis ([place]) 08/29/2017   Elevated lactic acid level 08/29/2017   Acute gastroenteritis 08/29/2017   Diarrhea 08/29/2017   GERD (gastroesophageal reflux disease) 08/29/2017   Positive D dimer 08/29/2017   Encounter for screening colonoscopy 02/01/2014   Orthostatic hypotension 01/15/2014   Dyslipidemia 08/14/2008   Essential hypertension 08/14/2008   Seasonal and perennial allergic rhinitis 04/23/2007   3:35 PM, 05/08/21 Jerene Pitch, DPT Physical Therapy with [facility]  [phone] office   [place] [address] [place], [place], [address] Phone: [phone]   Fax:  [phone]  Name: DARION MILEWSKI MRN: [id number] Date of Birth: 1956-05-04

## 2021-05-14 ENCOUNTER — Encounter (HOSPITAL_COMMUNITY): Payer: Self-pay | Admitting: Emergency Medicine

## 2021-05-14 ENCOUNTER — Other Ambulatory Visit: Payer: Self-pay

## 2021-05-14 ENCOUNTER — Observation Stay (HOSPITAL_COMMUNITY)
Admission: EM | Admit: 2021-05-14 | Discharge: 2021-05-15 | Disposition: A | Discharge status: Home / Self Care | Payer: BC Managed Care – PPO | Attending: Internal Medicine | Admitting: Internal Medicine

## 2021-05-14 DIAGNOSIS — Z7982 Long term (current) use of aspirin: Secondary | ICD-10-CM | POA: Diagnosis not present

## 2021-05-14 DIAGNOSIS — I10 Essential (primary) hypertension: Secondary | ICD-10-CM | POA: Insufficient documentation

## 2021-05-14 DIAGNOSIS — R9431 Abnormal electrocardiogram [ECG] [EKG]: Secondary | ICD-10-CM | POA: Insufficient documentation

## 2021-05-14 DIAGNOSIS — Z7984 Long term (current) use of oral hypoglycemic drugs: Secondary | ICD-10-CM | POA: Diagnosis not present

## 2021-05-14 DIAGNOSIS — R55 Syncope and collapse: Secondary | ICD-10-CM | POA: Diagnosis not present

## 2021-05-14 DIAGNOSIS — M4802 Spinal stenosis, cervical region: Secondary | ICD-10-CM | POA: Diagnosis not present

## 2021-05-14 DIAGNOSIS — N401 Enlarged prostate with lower urinary tract symptoms: Secondary | ICD-10-CM

## 2021-05-14 DIAGNOSIS — M5030 Other cervical disc degeneration, unspecified cervical region: Secondary | ICD-10-CM

## 2021-05-14 DIAGNOSIS — Z20822 Contact with and (suspected) exposure to covid-19: Secondary | ICD-10-CM | POA: Diagnosis not present

## 2021-05-14 DIAGNOSIS — Z79899 Other long term (current) drug therapy: Secondary | ICD-10-CM | POA: Diagnosis not present

## 2021-05-14 DIAGNOSIS — R600 Localized edema: Secondary | ICD-10-CM | POA: Diagnosis not present

## 2021-05-14 DIAGNOSIS — R972 Elevated prostate specific antigen [PSA]: Secondary | ICD-10-CM

## 2021-05-14 DIAGNOSIS — N400 Enlarged prostate without lower urinary tract symptoms: Secondary | ICD-10-CM

## 2021-05-14 DIAGNOSIS — R609 Edema, unspecified: Secondary | ICD-10-CM

## 2021-05-14 DIAGNOSIS — D539 Nutritional anemia, unspecified: Secondary | ICD-10-CM

## 2021-05-14 LAB — BASIC METABOLIC PANEL
Anion gap: 6 (ref 5–15)
BUN: 23 mg/dL (ref 8–23)
CO2: 29 mmol/L (ref 22–32)
Calcium: 9.1 mg/dL (ref 8.9–10.3)
Chloride: 102 mmol/L (ref 98–111)
Creatinine, Ser: 0.91 mg/dL (ref 0.61–1.24)
GFR, Estimated: >60 mL/min (ref >60)
Glucose, Bld: 124 mg/dL — ABNORMAL HIGH (ref 70–99)
Potassium: 3.6 mmol/L (ref 3.5–5.1)
Sodium: 137 mmol/L (ref 135–145)

## 2021-05-14 LAB — CBC
HCT: 38.6 % — ABNORMAL LOW (ref 39.0–52.0)
Hemoglobin: 12.9 g/dL — ABNORMAL LOW (ref 13.0–17.0)
MCH: 33.7 pg (ref 26.0–34.0)
MCHC: 33.4 g/dL (ref 30.0–36.0)
MCV: 100.8 fL — ABNORMAL HIGH (ref 80.0–100.0)
Platelets: 169 10*3/uL (ref 150–400)
RBC: 3.83 MIL/uL — ABNORMAL LOW (ref 4.22–5.81)
RDW: 16.6 % — ABNORMAL HIGH (ref 11.5–15.5)
WBC: 6.1 10*3/uL (ref 4.0–10.5)
nRBC: 0.3 % — ABNORMAL HIGH (ref 0.0–0.2)

## 2021-05-14 LAB — URINALYSIS, ROUTINE W REFLEX MICROSCOPIC
Bilirubin Urine: NEGATIVE
Glucose, UA: NEGATIVE mg/dL
Hgb urine dipstick: NEGATIVE
Ketones, ur: NEGATIVE mg/dL
Leukocytes,Ua: NEGATIVE
Nitrite: NEGATIVE
Protein, ur: NEGATIVE mg/dL
Specific Gravity, Urine: 1.013 (ref 1.005–1.030)
pH: 5 (ref 5.0–8.0)

## 2021-05-14 LAB — CBG MONITORING, ED: Glucose-Capillary: 106 mg/dL — ABNORMAL HIGH (ref 70–99)

## 2021-05-14 LAB — BRAIN NATRIURETIC PEPTIDE: B Natriuretic Peptide: 88 pg/mL (ref 0.0–100.0)

## 2021-05-14 NOTE — H&P (Signed)
History and Physical  Andrew Arnold STM:[id number] DOB: November 03, 1955 DOA: 05/14/2021  Referring physician: Varney Biles, MD  PCP: [name], MD  Patient coming from: Home  Chief Complaint: Loss of consciousness  HPI: Andrew Arnold is an obese 65 y.o. male with medical history significant for hypertension, prediabetes (hemoglobin A1c 6.4 on 08/29/2017), BPH, cervical spinal stenosis and DDD who presents to the emergency department due to syncopal episode sustained at home prior to arrival to the ED.  Patient complained of sudden loss of consciousness while placing some food in the refrigerator at home, he was unsure how long he was down, but he denies any confusion upon regaining consciousness and denies biting of tongue or any urinary incontinence.  He endorsed increased leg swelling within last 2 weeks, however he denies any symptoms prior to today's incident.  Patient denies shortness of breath on exertion or on laying in bed.  He was scheduled to have prostate biopsy with Dr. Jeffie Pollock at the surgery here tomorrow and patient states that he has been following neurology due to right-sided weakness thought to be due to cervical spinal stenosis and DDD.  ED Course:  In the emergency department, he was hemodynamically stable.  Work-up in the ED showed macrocytic anemia, BMP was normal except for slight elevation in CBG at 124, BNP 88.0, urinalysis was normal. Hospitalist was asked to admit patient for further evaluation and management.  Review of Systems: Constitutional: Negative for chills and fever.  HENT: Negative for ear pain and sore throat.   Eyes: Negative for pain and visual disturbance.  Respiratory: Negative for cough, chest tightness and shortness of breath.   Cardiovascular: Positive for sudden loss of consciousness.  Negative for chest pain and palpitations.  Gastrointestinal: Negative for abdominal pain and vomiting.  Endocrine: Negative for polyphagia and polyuria.   Genitourinary: Negative for decreased urine volume, dysuria, enuresis Musculoskeletal: Negative for arthralgias and back pain.  Skin: Negative for color change and rash.  Allergic/Immunologic: Negative for immunocompromised state.  Neurological: Negative for tremors, syncope, speech difficulty, weakness, light-headedness and headaches.  Hematological: Does not bruise/bleed easily.  All other systems reviewed and are negative   Past Medical History:  Diagnosis Date   Allergic rhinitis due to pollen    on allergy shots   Arrhythmia    1980s hospitalized for HTN and ?irregular rhythm but no specific dx but resolved during hospitalization   HTN (hypertension)    Hyperlipidemia    Past Surgical History:  Procedure Laterality Date   colonoscopy  2005   Dr. Gala Romney: normal rectum, sigmoid diverticula in colonic mucosa   COLONOSCOPY N/A 02/15/2014   Procedure: COLONOSCOPY;  Surgeon: Daneil Dolin, MD;  [facility];  Service: Endoscopy;  Laterality: N/A;  2:00    Social History:  reports that he has never smoked. He has never used smokeless tobacco. He reports that he does not drink alcohol and does not use drugs.   No Known Allergies  Family History  Problem Relation Age of Onset   Cancer Mother    Cirrhosis Father        cirrhosis of the liver; alcohol related   Other Other        neice was on allergy vaccine when young   Colon cancer Neg Hx      Prior to Admission medications   Medication Sig Start Date End Date Taking? Authorizing Provider  ascorbic acid (VITAMIN C) 500 MG tablet Take 500 mg by mouth every morning.  [provider]  aspirin 81 MG tablet Take 81 mg by mouth every morning.     [provider]  B Complex Vitamins (B COMPLEX 100 PO) Take 1 tablet by mouth every morning.     [provider]  Cetirizine HCl 10 MG CAPS Take 1 capsule by mouth 2 (two) times a day.     [provider]  cloNIDine (CATAPRES) 0.1 MG  tablet Take 0.1 mg by mouth 2 (two) times daily. 03/13/21   [provider]  cloNIDine (CATAPRES) 0.2 MG tablet Take 0.2 mg by mouth 3 (three) times daily. 04/29/21   [provider]  hydrochlorothiazide (HYDRODIURIL) 25 MG tablet Take 25 mg by mouth daily.    [provider]  levofloxacin (LEVAQUIN) 750 MG tablet Take 1 tablet 1 hour prior to the procedure. 05/01/21   Irine Seal, MD  meloxicam (MOBIC) 15 MG tablet Take 15 mg by mouth daily. 04/20/21   [provider]  metFORMIN (GLUCOPHAGE) 1000 MG tablet Take by mouth 2 (two) times daily. 03/21/21   [provider]  Na Sulfate-K Sulfate-Mg Sulf 17.5-3.13-1.6 GM/177ML SOLN Take by mouth once. 04/07/21   [provider]  potassium chloride (K-DUR) 10 MEQ tablet Take 1 tablet by mouth daily. 08/12/17   [provider]  tamsulosin (FLOMAX) 0.4 MG CAPS capsule Take 1 capsule (0.4 mg total) by mouth daily. 04/10/21   Irine Seal, MD    Physical Exam: BP 124/68   Pulse 62   Temp 99 F (37.2 C) (Oral)   Resp 17   Ht 5\' 9"  (1.753 m)   Wt 115.2 kg   SpO2 100%   BMI 37.51 kg/m   General: 65 y.o. year-old male well developed well nourished in no acute distress.  Alert and oriented x3. HEENT: NCAT, EOMI Neck: Supple, trachea medial Cardiovascular: Regular rate and rhythm with no rubs or gallops.  No thyromegaly or JVD noted. 2/4 pulses in all 4 extremities. Respiratory: Clear to auscultation with no wheezes or rales. Good inspiratory effort. Abdomen: Soft, nontender nondistended with normal bowel sounds x4 quadrants. Muskuloskeletal: +3 edema bilaterally ( R > L ).  No cyanosis or clubbing noted bilaterally Neuro: CN II-XII intact, strength 5/5 x 4, sensation, reflexes intact Skin: No ulcerative lesions noted or rashes Psychiatry: Judgement and insight appear normal. Mood is appropriate for condition and setting          Labs on Admission:  Basic Metabolic Panel: Recent Labs  Lab  05/14/21 2027  NA 137  K 3.6  CL 102  CO2 29  GLUCOSE 124*  BUN 23  CREATININE 0.91  CALCIUM 9.1   Liver Function Tests: No results for input(s): AST, ALT, ALKPHOS, BILITOT, PROT, ALBUMIN in the last 168 hours. No results for input(s): LIPASE, AMYLASE in the last 168 hours. No results for input(s): AMMONIA in the last 168 hours. CBC: Recent Labs  Lab 05/14/21 2027  WBC 6.1  HGB 12.9*  HCT 38.6*  MCV 100.8*  PLT 169   Cardiac Enzymes: No results for input(s): CKTOTAL, CKMB, CKMBINDEX, TROPONINI in the last 168 hours.  BNP (last 3 results) Recent Labs    05/14/21 2027  BNP 88.0    ProBNP (last 3 results) No results for input(s): PROBNP in the last 8760 hours.  CBG: Recent Labs  Lab 05/14/21 2136  GLUCAP 106*    Radiological Exams on Admission: No results found.  EKG: I independently viewed the EKG done and my findings are as  followed: Normal sinus rhythm at rate of 64 bpm with T wave inversion in inferior and lateral leads (not significantly changed from EKG done on 08/29/2017).  Assessment/Plan Present on Admission:  Syncope  Essential hypertension  Principal Problem:   Syncope Active Problems:   Essential hypertension   Macrocytic anemia   Bilateral lower extremity edema   BPH (benign prostatic hyperplasia)   Elevated PSA   Cervical spinal stenosis   DDD (degenerative disc disease), cervical  Acute syncopal episode Continue telemetry and watch for arrhythmias Troponins pending  EKG personally reviewed showed normal sinus rhythm at rate of 64 bpm with T wave inversion in inferior and lateral leads (not significantly changed from EKG done on 08/29/2017). Echocardiogram done on 11/13/2013 showed LVEF of 60 to 65%.  Left ventricular has grade 1 diastolic dysfunction Echocardiogram will be done to rule out significant aortic stenosis or other outflow obstruction, and also to evaluate EF and to rule out segmental/Regional wall motion abnormalities.   Carotid artery Dopplers will be done to rule out hemodynamically significant stenosis  Bilateral lower extremity edema ( R > L ) Lower extremity US in the morning  Elevated PSA Pt. was scheduled to have  a prostate biopsy with Dr. Jeffie Pollock at day surgery here at [facility] tomorrow Please notify him in the morning that patient is currently admitted to the hospital  Essential hypertension Continue clonidine per home regimen  BPH with LUTS Continue Flomax  Macrocytic anemia Folate and vitamin B12 levels will be checked   Cervical spinal stenosis and DDD Stable, patient follows with outpatient neurology   DVT prophylaxis: SCDs (please consider starting Lovenox if patient will not be able to have his scheduled prostate biopsy in the morning).  Code Status: Full code  Family Communication: Sister at bedside (All questions answered to satisfaction)   Disposition Plan:  Patient is from:                        home Anticipated DC to:                   SNF or family members home Anticipated DC date:               2-3 days Anticipated DC barriers:         Pt requires inpatient management due to syncopal episode pending further work up    Consults called: None   Admission status: Observation     Bernadette Hoit MD [facility]  05/15/2021, 1:16 AM

## 2021-05-14 NOTE — ED Triage Notes (Signed)
Pt states he was putting something in the fridge and then woke up on the floor. Pt denies any pain or symptoms.

## 2021-05-14 NOTE — ED Provider Notes (Signed)
[facility] EMERGENCY DEPARTMENT Provider Note   CSN: [id number] Arrival date & time: 05/14/21  1957     History Chief Complaint  Patient presents with   Loss of Consciousness    Andrew Arnold is a 65 y.o. male.  HPI    65 year old male comes in with chief complaint of loss of consciousness.  Patient has history of hypertension, hyperlipidemia and his history indicates an isolated episode of arrhythmia in the 52s.  Patient reports that he was in front of his fridge after work and the next thing he remembers was getting up from the floor.  He did not have any prodrome prior to the fainting spell.  Specifically no chest pain, palpitation, shortness of breath, dizziness, nausea, diaphoresis.  No recent syncopal episode.  He does however indicate that over the last 2 weeks he has noted swelling in his legs.  Patient is supposed to get prostate biopsy tomorrow and is also being worked up by neurosurgery for right-sided weakness.  Past Medical History:  Diagnosis Date   Allergic rhinitis due to pollen    on allergy shots   Arrhythmia    1980s hospitalized for HTN and ?irregular rhythm but no specific dx but resolved during hospitalization   HTN (hypertension)    Hyperlipidemia     Patient Active Problem List   Diagnosis Date Noted   Syncope 05/14/2021   Elevated hemoglobin A1c 04/10/2021   Chest pain 08/29/2017   Fever 08/29/2017   Acute URI 08/29/2017   Moderate dehydration 08/29/2017   Sepsis ([place]) 08/29/2017   Elevated lactic acid level 08/29/2017   Acute gastroenteritis 08/29/2017   Diarrhea 08/29/2017   GERD (gastroesophageal reflux disease) 08/29/2017   Positive D dimer 08/29/2017   Encounter for screening colonoscopy 02/01/2014   Orthostatic hypotension 01/15/2014   Dyslipidemia 08/14/2008   Essential hypertension 08/14/2008   Seasonal and perennial allergic rhinitis 04/23/2007    Past Surgical History:  Procedure Laterality Date   colonoscopy  2005    Dr. Gala Romney: normal rectum, sigmoid diverticula in colonic mucosa   COLONOSCOPY N/A 02/15/2014   Procedure: COLONOSCOPY;  Surgeon: Daneil Dolin, MD;  Location: [facility];  Service: Endoscopy;  Laterality: N/A;  2:00       Family History  Problem Relation Age of Onset   Cancer Mother    Cirrhosis Father        cirrhosis of the liver; alcohol related   Other Other        neice was on allergy vaccine when young   Colon cancer Neg Hx     Social History   Tobacco Use   Smoking status: Never   Smokeless tobacco: Never  Vaping Use   Vaping Use: Never used  Substance Use Topics   Alcohol use: No   Drug use: No    Home Medications Prior to Admission medications   Medication Sig Start Date End Date Taking? Authorizing Provider  ascorbic acid (VITAMIN C) 500 MG tablet Take 500 mg by mouth every morning.     [provider]  aspirin 81 MG tablet Take 81 mg by mouth every morning.     [provider]  B Complex Vitamins (B COMPLEX 100 PO) Take 1 tablet by mouth every morning.     [provider]  Cetirizine HCl 10 MG CAPS Take 1 capsule by mouth 2 (two) times a day.     [provider]  cloNIDine (CATAPRES) 0.1 MG tablet Take 0.1 mg by mouth  2 (two) times daily. 03/13/21   [provider]  cloNIDine (CATAPRES) 0.2 MG tablet Take 0.2 mg by mouth 3 (three) times daily. 04/29/21   [provider]  hydrochlorothiazide (HYDRODIURIL) 25 MG tablet Take 25 mg by mouth daily.    [provider]  levofloxacin (LEVAQUIN) 750 MG tablet Take 1 tablet 1 hour prior to the procedure. 05/01/21   Irine Seal, MD  meloxicam (MOBIC) 15 MG tablet Take 15 mg by mouth daily. 04/20/21   [provider]  metFORMIN (GLUCOPHAGE) 1000 MG tablet Take by mouth 2 (two) times daily. 03/21/21   [provider]  Na Sulfate-K Sulfate-Mg Sulf 17.5-3.13-1.6 GM/177ML SOLN Take by mouth once. 04/07/21   [provider]  potassium chloride  (K-DUR) 10 MEQ tablet Take 1 tablet by mouth daily. 08/12/17   [provider]  tamsulosin (FLOMAX) 0.4 MG CAPS capsule Take 1 capsule (0.4 mg total) by mouth daily. 04/10/21   Irine Seal, MD    Allergies    Patient has no known allergies.  Review of Systems   Review of Systems  Constitutional:  Positive for activity change.  Respiratory:  Negative for shortness of breath.   Cardiovascular:  Negative for chest pain.  Neurological:  Positive for syncope.  All other systems reviewed and are negative.  Physical Exam Updated Vital Signs BP (!) 165/87   Pulse 61   Temp 99 F (37.2 C) (Oral)   Resp (!) 21   Ht 5\' 9"  (1.753 m)   Wt 115.2 kg   SpO2 100%   BMI 37.51 kg/m   Physical Exam Vitals and nursing note reviewed.  Constitutional:      Appearance: He is well-developed.  HENT:     Head: Atraumatic.  Cardiovascular:     Rate and Rhythm: Normal rate.  Pulmonary:     Effort: Pulmonary effort is normal.  Musculoskeletal:     Cervical back: Neck supple.     Right lower leg: Edema present.     Left lower leg: Edema present.     Comments: 3+ bilateral pitting edema, right worse than left  Skin:    General: Skin is warm.  Neurological:     Mental Status: He is alert and oriented to person, place, and time.    ED Results / Procedures / Treatments   Labs (all labs ordered are listed, but only abnormal results are displayed) Labs Reviewed  BASIC METABOLIC PANEL - Abnormal; Notable for the following components:      Result Value   Glucose, Bld 124 (*)    All other components within normal limits  CBC - Abnormal; Notable for the following components:   RBC 3.83 (*)    Hemoglobin 12.9 (*)    HCT 38.6 (*)    MCV 100.8 (*)    RDW 16.6 (*)    nRBC 0.3 (*)    All other components within normal limits  CBG MONITORING, ED - Abnormal; Notable for the following components:   Glucose-Capillary 106 (*)    All other components within normal limits  RESP PANEL BY RT-PCR  (FLU A&B, COVID) ARPGX2  BRAIN NATRIURETIC PEPTIDE  URINALYSIS, ROUTINE W REFLEX MICROSCOPIC    EKG EKG Interpretation  Date/Time:  Wednesday May 14 2021 20:14:36 EDT Ventricular Rate:  64 PR Interval:  154 QRS Duration: 96 QT Interval:  414 QTC Calculation: 427 R Axis:   -10 Text Interpretation: Normal sinus rhythm Anterior infarct , age undetermined T wave abnormality, consider inferolateral ischemia  Abnormal ECG t waves more pronounced Confirmed by Varney Biles [phone]) on 05/14/2021 10:40:59 PM  Radiology No results found.  Procedures Procedures   Medications Ordered in ED Medications - No data to display  ED Course  I have reviewed the triage vital signs and the nursing notes.  Pertinent labs & imaging results that were available during my care of the patient were reviewed by me and considered in my medical decision making (see chart for details).    MDM Rules/Calculators/A&P                           65 year old male comes in with chief complaint of syncope.  DDx includes: Orthostatic hypotension Stroke Vertebral artery dissection/stenosis Dysrhythmia PE Vasovagal/neurocardiogenic syncope Aortic stenosis Valvular disorder/Cardiomyopathy Anemia   Syncope without prodrome concerning for arrhythmia  Additionally, patient noted to have bilateral pitting edema that is 3+.  Right side worse than left.  DVT ultrasound ordered for the right lower extremity.  Clinical suspicion for probably new onset CHF or valvular pathology leading to the pitting edema.  Will consult medicine for admission  Final Clinical Impression(s) / ED Diagnoses Final diagnoses:  Syncope and collapse  Pitting edema  Abnormal EKG    Rx / DC Orders ED Discharge Orders     None        Varney Biles, MD 05/14/21 2332

## 2021-05-15 ENCOUNTER — Ambulatory Visit: Payer: BC Managed Care – PPO | Admitting: Urology

## 2021-05-15 ENCOUNTER — Ambulatory Visit (HOSPITAL_COMMUNITY): Admission: RE | Admit: 2021-05-15 | Payer: BC Managed Care – PPO | Source: Ambulatory Visit

## 2021-05-15 ENCOUNTER — Observation Stay (HOSPITAL_BASED_OUTPATIENT_CLINIC_OR_DEPARTMENT_OTHER): Payer: BC Managed Care – PPO

## 2021-05-15 ENCOUNTER — Encounter (HOSPITAL_COMMUNITY): Payer: Self-pay

## 2021-05-15 ENCOUNTER — Observation Stay (HOSPITAL_COMMUNITY): Payer: BC Managed Care – PPO

## 2021-05-15 ENCOUNTER — Telehealth: Payer: Self-pay

## 2021-05-15 ENCOUNTER — Telehealth: Payer: Self-pay | Admitting: *Deleted

## 2021-05-15 ENCOUNTER — Encounter (HOSPITAL_COMMUNITY): Payer: Self-pay | Admitting: Internal Medicine

## 2021-05-15 ENCOUNTER — Other Ambulatory Visit: Payer: BC Managed Care – PPO | Admitting: Urology

## 2021-05-15 DIAGNOSIS — R600 Localized edema: Secondary | ICD-10-CM | POA: Diagnosis not present

## 2021-05-15 DIAGNOSIS — M5030 Other cervical disc degeneration, unspecified cervical region: Secondary | ICD-10-CM

## 2021-05-15 DIAGNOSIS — N400 Enlarged prostate without lower urinary tract symptoms: Secondary | ICD-10-CM

## 2021-05-15 DIAGNOSIS — R55 Syncope and collapse: Secondary | ICD-10-CM

## 2021-05-15 DIAGNOSIS — N401 Enlarged prostate with lower urinary tract symptoms: Secondary | ICD-10-CM | POA: Diagnosis not present

## 2021-05-15 DIAGNOSIS — D539 Nutritional anemia, unspecified: Secondary | ICD-10-CM

## 2021-05-15 DIAGNOSIS — M7989 Other specified soft tissue disorders: Secondary | ICD-10-CM | POA: Diagnosis not present

## 2021-05-15 DIAGNOSIS — R972 Elevated prostate specific antigen [PSA]: Secondary | ICD-10-CM

## 2021-05-15 DIAGNOSIS — M4802 Spinal stenosis, cervical region: Secondary | ICD-10-CM

## 2021-05-15 LAB — TROPONIN I (HIGH SENSITIVITY)
Troponin I (High Sensitivity): 15 ng/L (ref <18)
Troponin I (High Sensitivity): 19 ng/L — ABNORMAL HIGH (ref <18)

## 2021-05-15 LAB — COMPREHENSIVE METABOLIC PANEL
ALT: 28 U/L (ref 0–44)
AST: 14 U/L — ABNORMAL LOW (ref 15–41)
Albumin: 3.4 g/dL — ABNORMAL LOW (ref 3.5–5.0)
Alkaline Phosphatase: 60 U/L (ref 38–126)
Anion gap: 9 (ref 5–15)
BUN: 20 mg/dL (ref 8–23)
CO2: 29 mmol/L (ref 22–32)
Calcium: 9.2 mg/dL (ref 8.9–10.3)
Chloride: 102 mmol/L (ref 98–111)
Creatinine, Ser: 0.88 mg/dL (ref 0.61–1.24)
GFR, Estimated: >60 mL/min (ref >60)
Glucose, Bld: 136 mg/dL — ABNORMAL HIGH (ref 70–99)
Potassium: 3.8 mmol/L (ref 3.5–5.1)
Sodium: 140 mmol/L (ref 135–145)
Total Bilirubin: 0.5 mg/dL (ref 0.3–1.2)
Total Protein: 6.1 g/dL — ABNORMAL LOW (ref 6.5–8.1)

## 2021-05-15 LAB — PROTIME-INR
INR: 1 (ref 0.8–1.2)
Prothrombin Time: 12.8 seconds (ref 11.4–15.2)

## 2021-05-15 LAB — ECHOCARDIOGRAM COMPLETE
AR max vel: 2.78 cm2
AV Area VTI: 2.83 cm2
AV Area mean vel: 2.72 cm2
AV Mean grad: 6 mmHg
AV Peak grad: 11 mmHg
Ao pk vel: 1.66 m/s
Area-P 1/2: 3.6 cm2
Calc EF: 58.7 %
Height: 69 in
MV VTI: 3.51 cm2
S' Lateral: 2.6 cm
Single Plane A2C EF: 52.1 %
Single Plane A4C EF: 60.5 %
Weight: 4064 oz

## 2021-05-15 LAB — RESP PANEL BY RT-PCR (FLU A&B, COVID) ARPGX2
Influenza A by PCR: NEGATIVE
Influenza B by PCR: NEGATIVE
SARS Coronavirus 2 by RT PCR: NEGATIVE

## 2021-05-15 LAB — CBC
HCT: 37.5 % — ABNORMAL LOW (ref 39.0–52.0)
Hemoglobin: 12 g/dL — ABNORMAL LOW (ref 13.0–17.0)
MCH: 32.7 pg (ref 26.0–34.0)
MCHC: 32 g/dL (ref 30.0–36.0)
MCV: 102.2 fL — ABNORMAL HIGH (ref 80.0–100.0)
Platelets: 175 10*3/uL (ref 150–400)
RBC: 3.67 MIL/uL — ABNORMAL LOW (ref 4.22–5.81)
RDW: 16.8 % — ABNORMAL HIGH (ref 11.5–15.5)
WBC: 6.9 10*3/uL (ref 4.0–10.5)
nRBC: 0 % (ref 0.0–0.2)

## 2021-05-15 LAB — HIV ANTIBODY (ROUTINE TESTING W REFLEX): HIV Screen 4th Generation wRfx: NONREACTIVE

## 2021-05-15 LAB — PHOSPHORUS: Phosphorus: 3.5 mg/dL (ref 2.5–4.6)

## 2021-05-15 LAB — VITAMIN B12: Vitamin B-12: 220 pg/mL (ref 180–914)

## 2021-05-15 LAB — APTT: aPTT: <20 seconds — ABNORMAL LOW (ref 24–36)

## 2021-05-15 LAB — FOLATE: Folate: 17.4 ng/mL (ref >5.9)

## 2021-05-15 IMAGING — US US CAROTID DUPLEX BILAT
1 series · 13 of 24 positions shown · non-contrast
Comparison: None.

CLINICAL DATA: 65-year-old male with history of syncope.

EXAM:
BILATERAL CAROTID DUPLEX ULTRASOUND
TECHNIQUE: Gray scale imaging, color Doppler and duplex ultrasound were
performed of bilateral carotid and vertebral arteries in the neck.

[Series 1: us carotid bilateral · 13 of 71 slices shown]
[im 1/71]
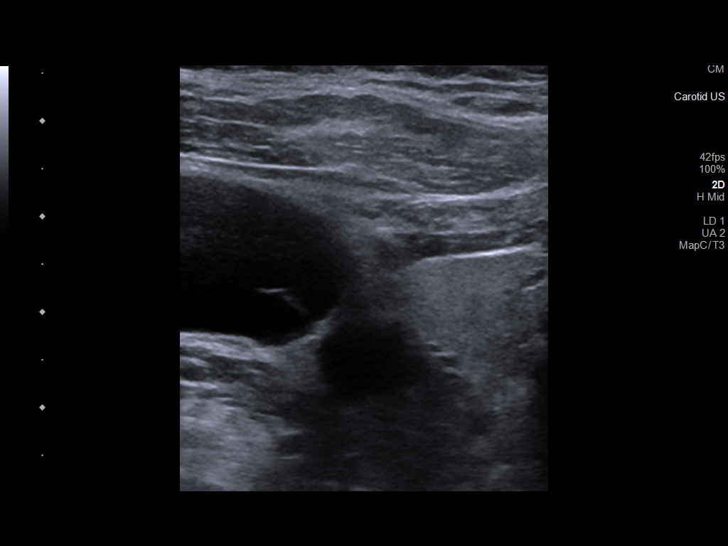
[im 7/71]
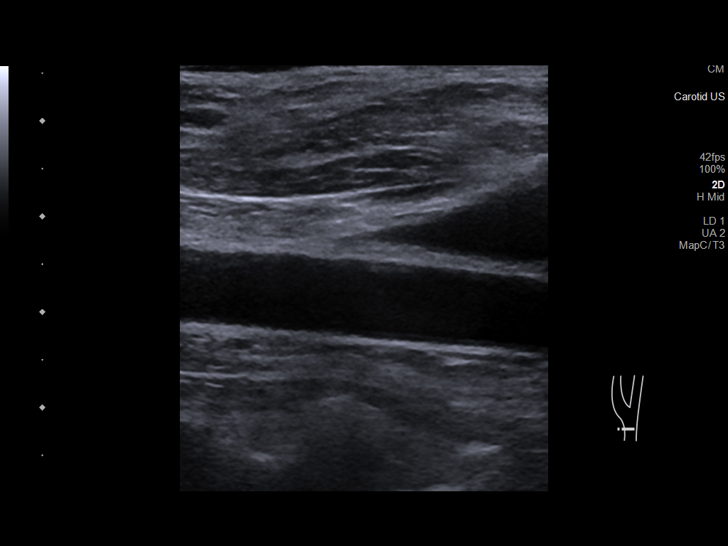
[im 13/71]
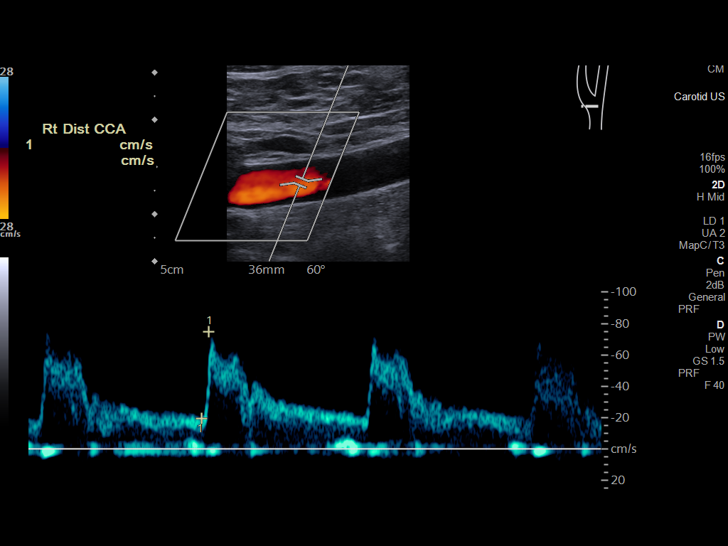
[im 19/71]
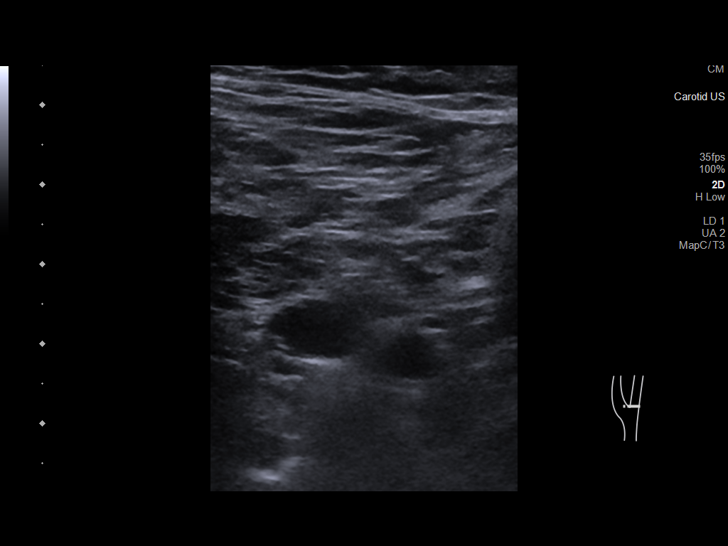
[im 25/71]
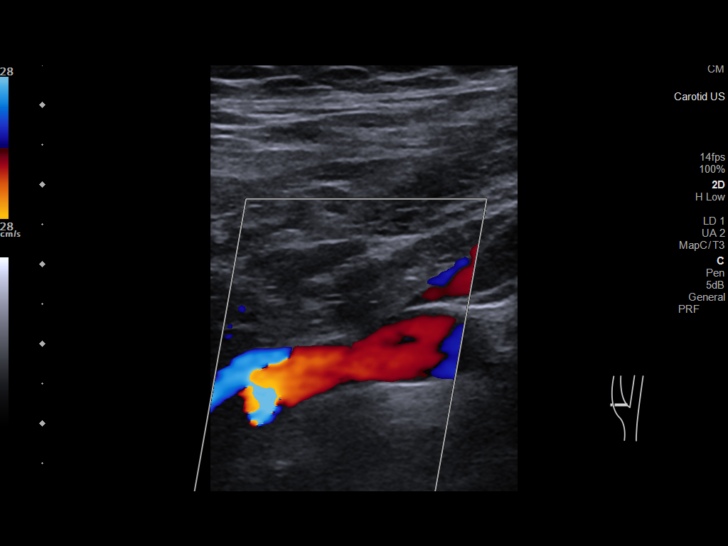
[im 31/71]
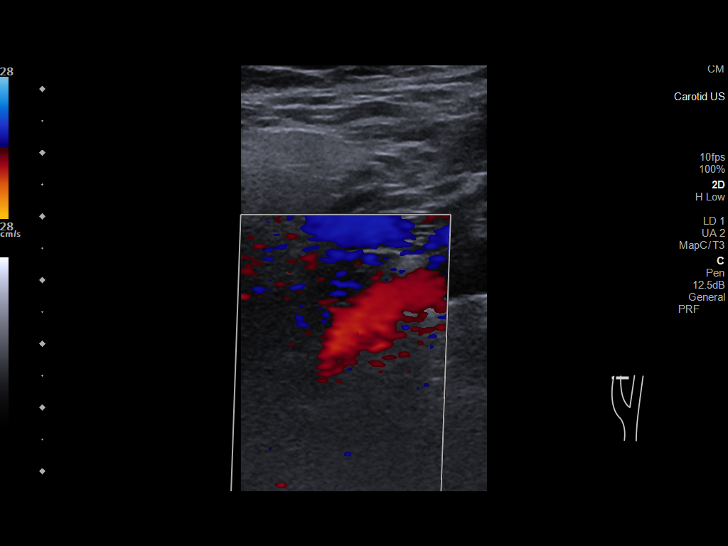
[im 37/71]
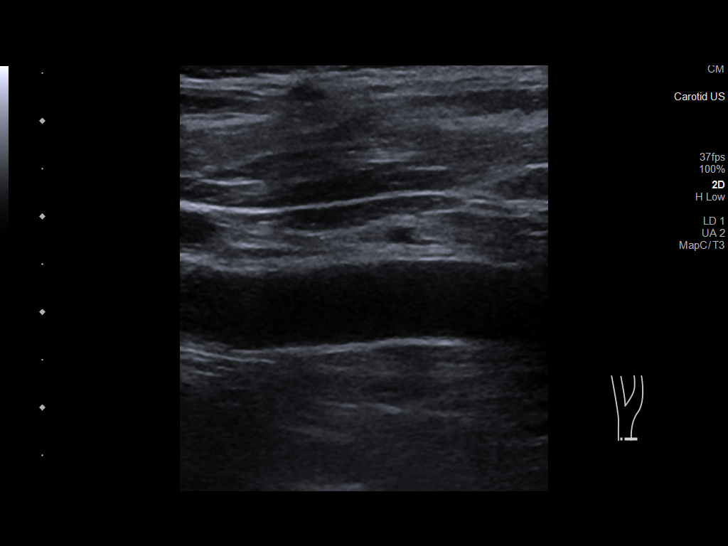
[im 40/71]
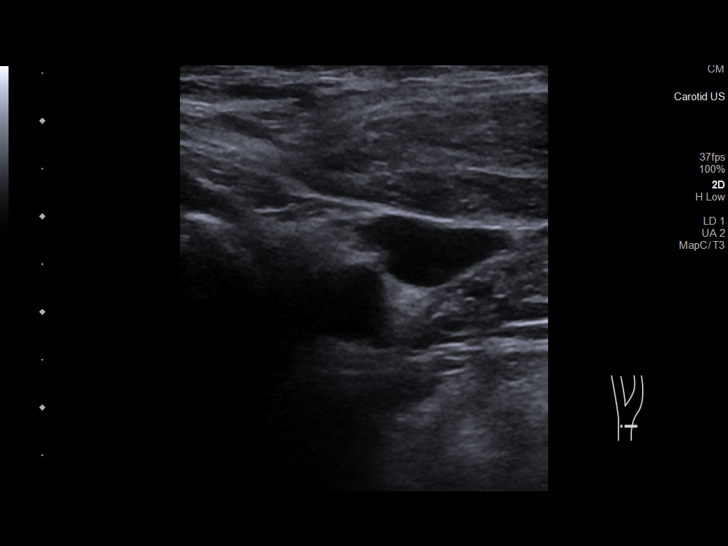
[im 46/71]
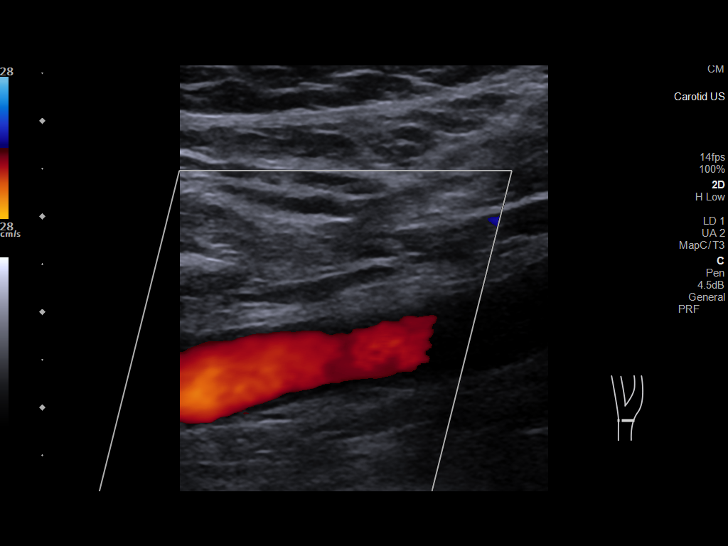
[im 52/71]
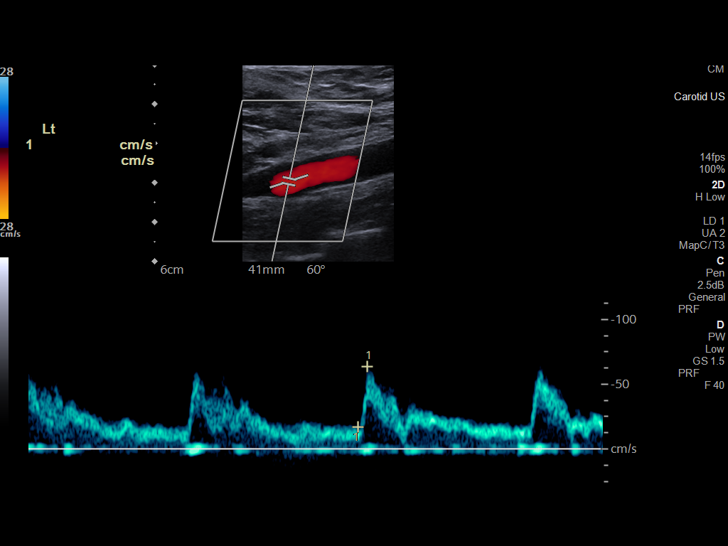
[im 58/71]
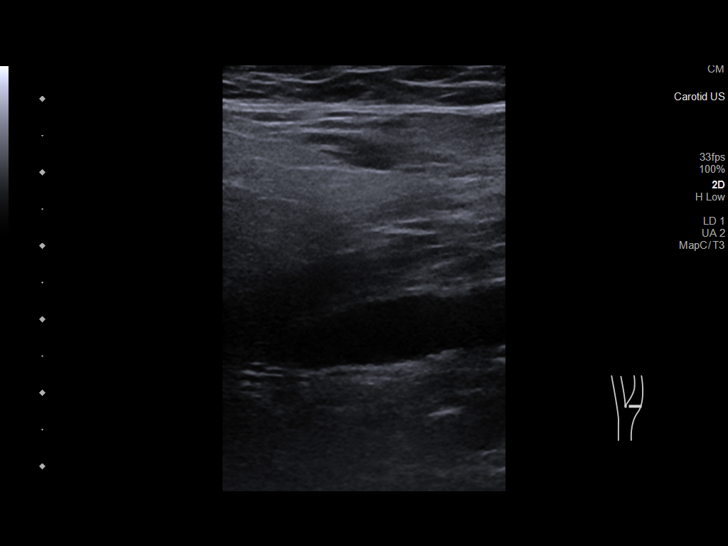
[im 64/71]
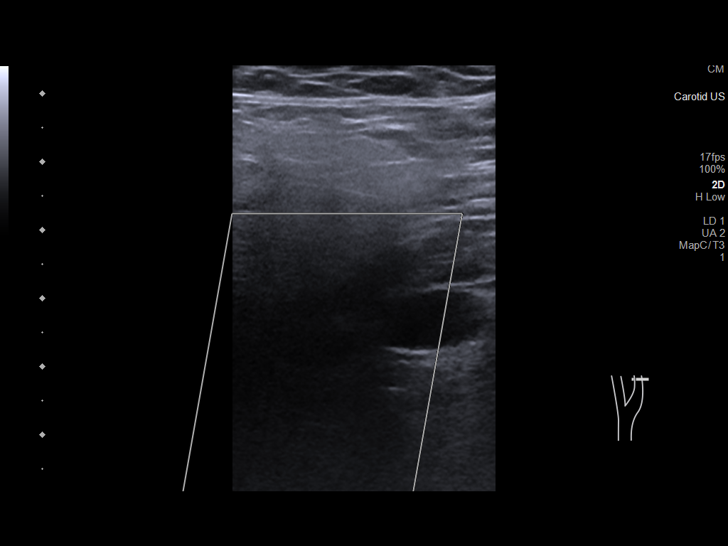
[im 71/71]
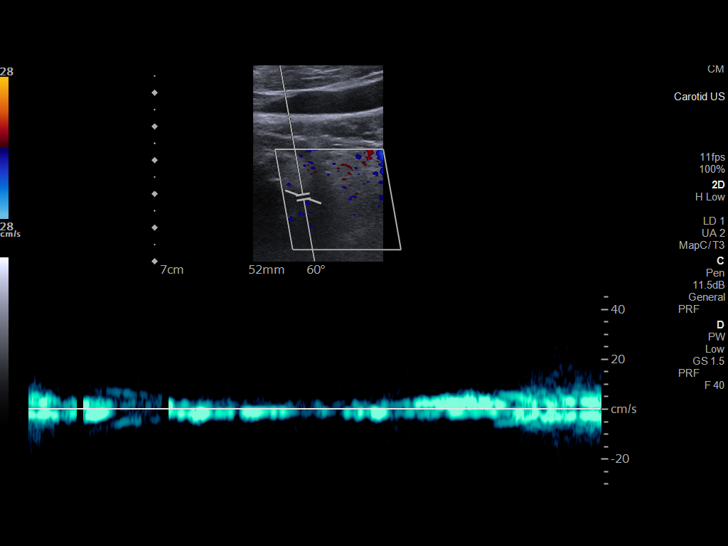

[13 of 24 positions shown; findings below may reference images not displayed]

FINDINGS: Criteria: Quantification of carotid stenosis is based on velocity
parameters that correlate the residual internal carotid diameter
with NASCET-based stenosis levels, using the diameter of the distal
internal carotid lumen as the denominator for stenosis measurement.

The following velocity measurements were obtained:

RIGHT

ICA: Peak systolic velocity 62 cm/sec, End diastolic velocity 20
cm/sec

CCA: Peak systolic velocity 80 cm/sec

SYSTOLIC ICA/CCA RATIO:

ECA: Peak systolic velocity 64 cm/sec

LEFT

ICA: Peak systolic velocity 68 cm/sec, End diastolic velocity 19
cm/sec

CCA: 80 cm/sec

SYSTOLIC ICA/CCA RATIO:

ECA: 58 cm/sec

RIGHT CAROTID ARTERY: No atherosclerotic plaque formation. No
significant tortuosity. Normal low resistance waveforms.

RIGHT VERTEBRAL ARTERY:  Antegrade flow.

LEFT CAROTID ARTERY: No atherosclerotic plaque formation. No
significant tortuosity. Normal low resistance waveforms.

LEFT VERTEBRAL ARTERY:  Not definitively visualized.

Upper extremity non-invasive blood pressures:
IMPRESSION: 1. Right carotid artery system: Patent without significant
atherosclerotic plaque formation.

2. Left carotid artery system: Patent without significant
atherosclerotic plaque formation.

3. Vertebral artery system: The left vertebral artery is not
visualized. The right vertebral artery is patent with antegrade
flow.

## 2021-05-15 IMAGING — US US EXTREM LOW VENOUS*R*
1 series · 14 of 24 positions shown · non-contrast
Comparison: None.

CLINICAL DATA: Right leg swelling

EXAM:
Right LOWER EXTREMITY VENOUS DOPPLER ULTRASOUND
TECHNIQUE: Gray-scale sonography with compression, as well as color and duplex
ultrasound, were performed to evaluate the deep venous system(s)
from the level of the common femoral vein through the popliteal and
proximal calf veins.

[Series 1: us venous img lower uni right (dvt) · portal-venous · 14 of 44 slices shown]
[im 1/44]
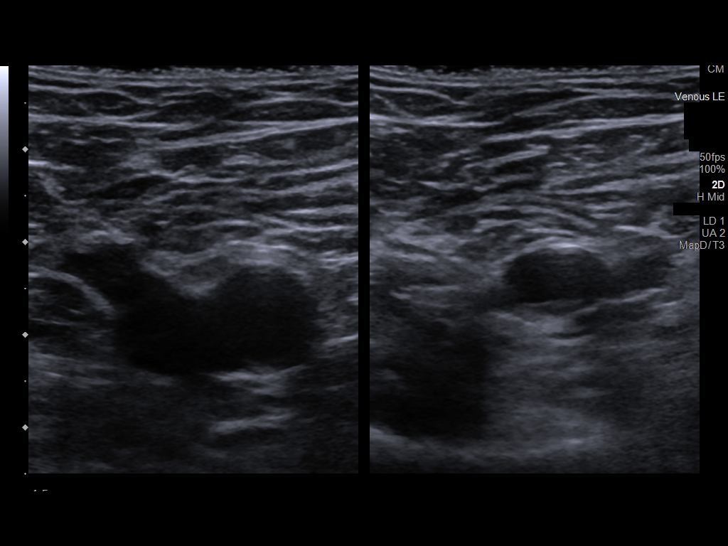
[im 4/44]
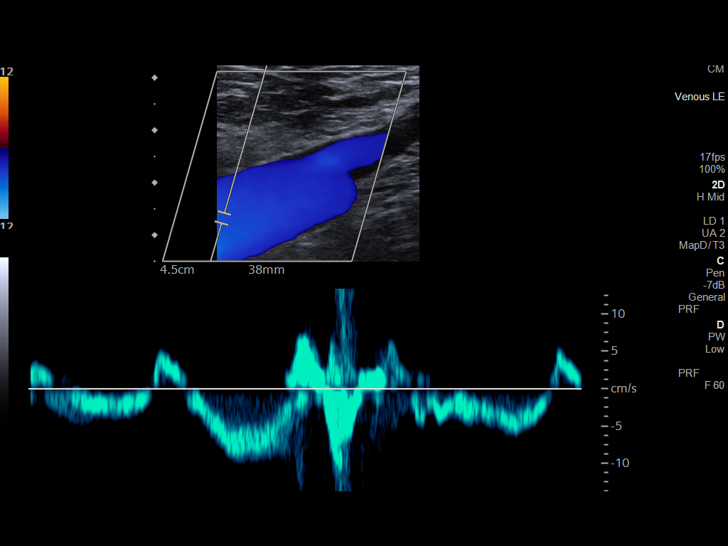
[im 8/44]
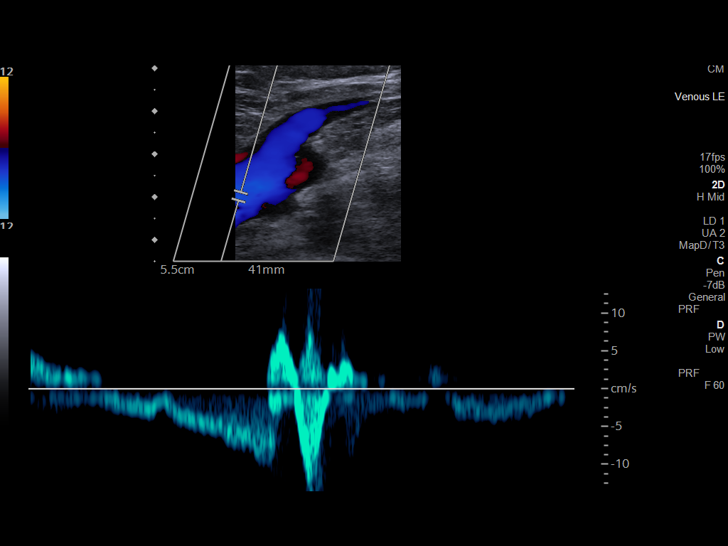
[im 12/44]
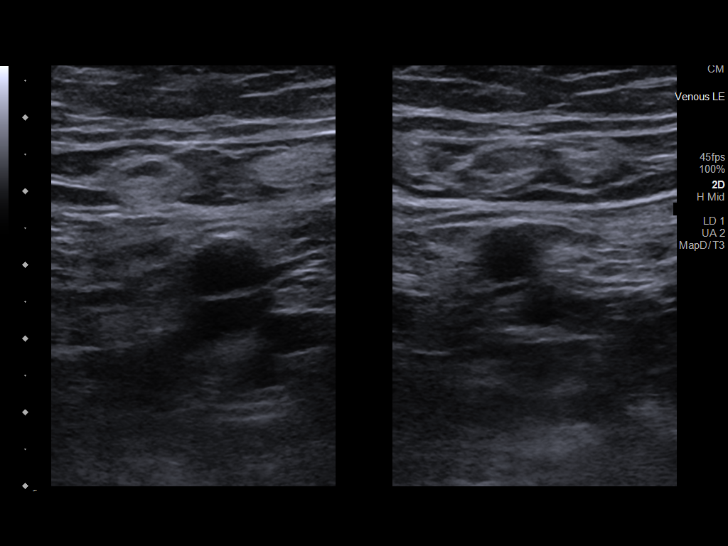
[im 14/44]
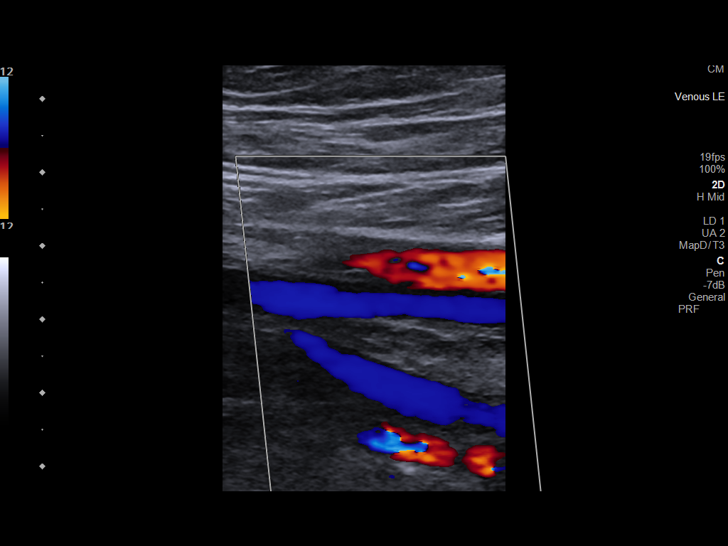
[im 17/44]
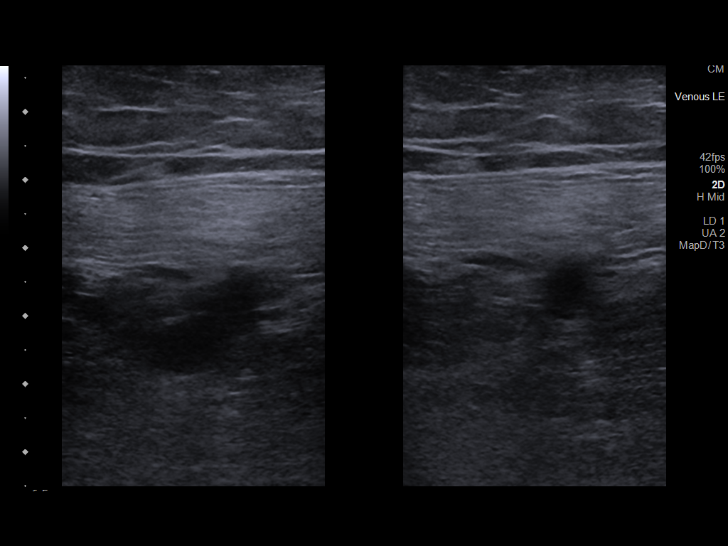
[im 21/44]
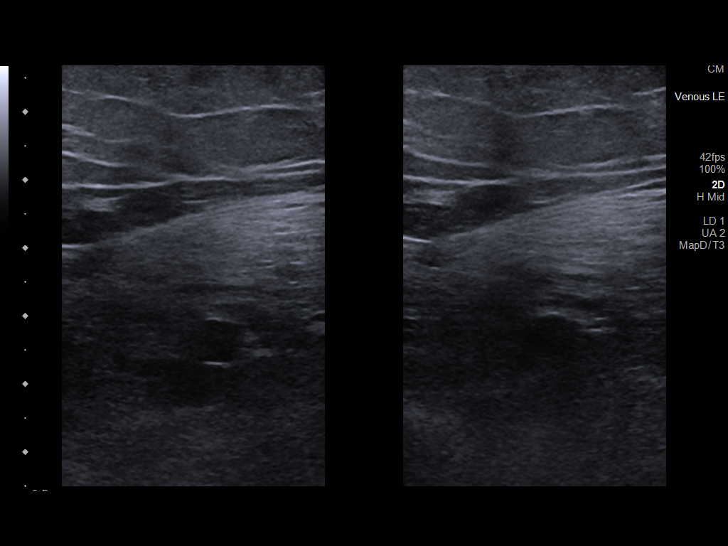
[im 23/44]
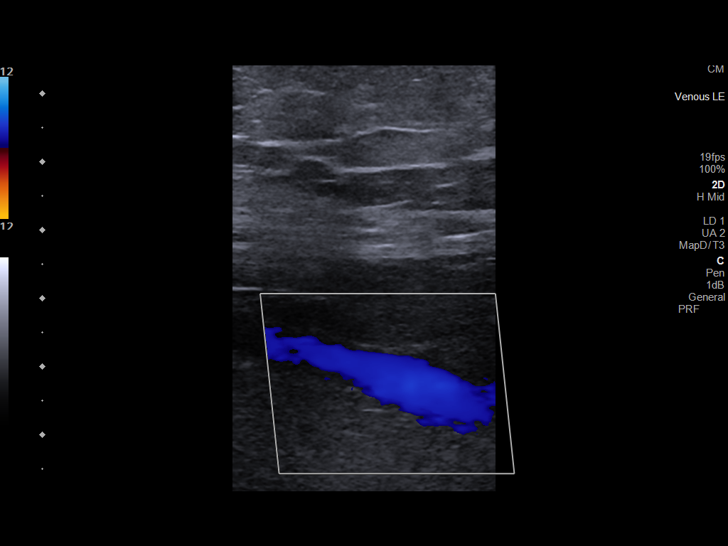
[im 27/44]
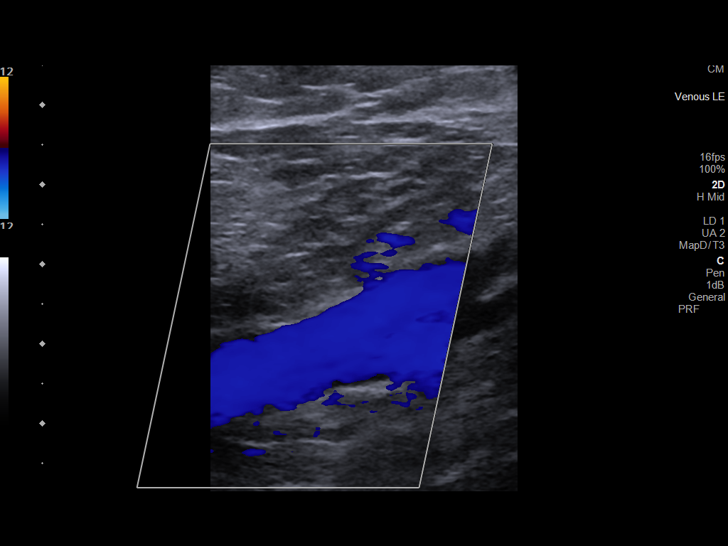
[im 30/44]
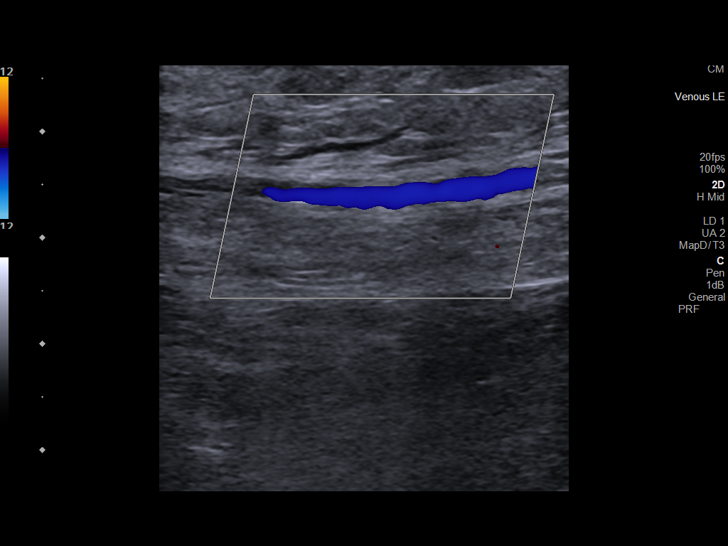
[im 34/44]
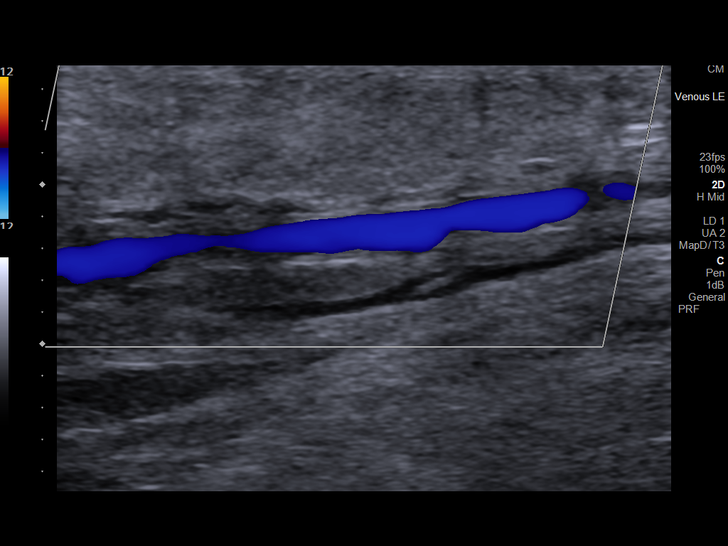
[im 36/44]
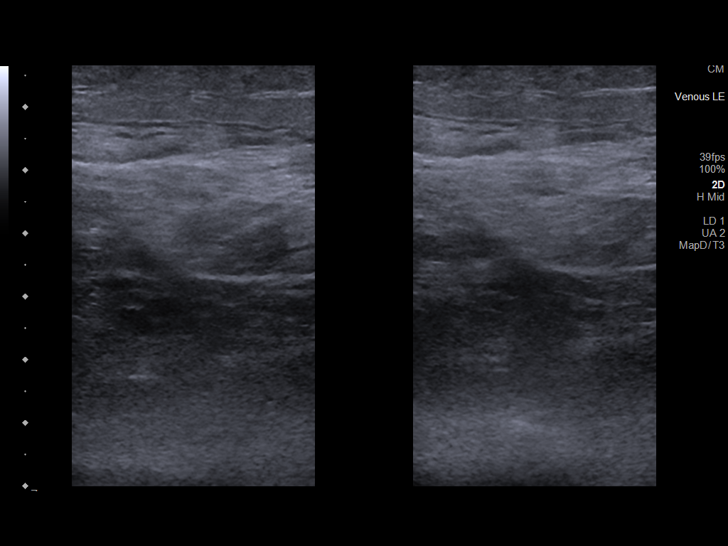
[im 40/44]
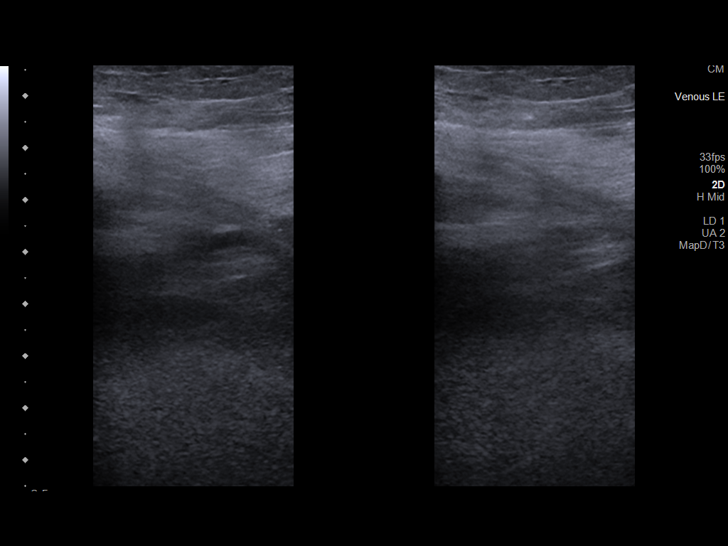
[im 44/44]
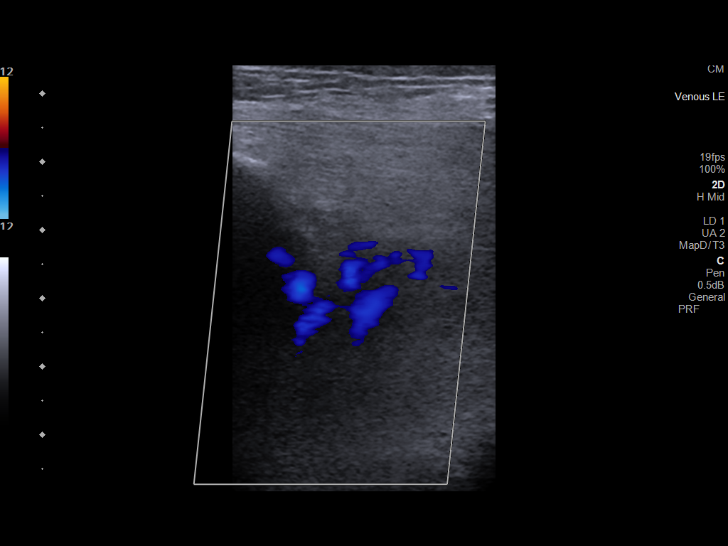

[14 of 24 positions shown; findings below may reference images not displayed]

FINDINGS: VENOUS

Normal compressibility of the common femoral, superficial femoral,
and popliteal veins, as well as the visualized calf veins.
Visualized portions of profunda femoral vein and great saphenous
vein unremarkable. No filling defects to suggest DVT on grayscale or
color Doppler imaging. Doppler waveforms show normal direction of
venous flow, normal respiratory plasticity and response to
augmentation.

Limited views of the contralateral common femoral vein are
unremarkable.

OTHER

None.

Limitations: none
IMPRESSION: Negative.

## 2021-05-15 MED ORDER — TAMSULOSIN HCL 0.4 MG PO CAPS
0.4000 mg | ORAL_CAPSULE | Freq: Every day | ORAL | Status: DC
  Administered 2021-05-15: 0.4 mg via ORAL
  Filled 2021-05-15: qty 1

## 2021-05-15 MED ORDER — ENOXAPARIN SODIUM 40 MG/0.4ML IJ SOSY: 40.0000 mg | PREFILLED_SYRINGE | INTRAMUSCULAR | Status: DC

## 2021-05-15 MED ORDER — LIDOCAINE HCL (PF) 2 % IJ SOLN
INTRAMUSCULAR | Status: AC
  Filled 2021-05-15: qty 10

## 2021-05-15 MED ORDER — MAGNESIUM SULFATE 2 GM/50ML IV SOLN
2.0000 g | Freq: Once | INTRAVENOUS | Status: AC
  Administered 2021-05-15: 2 g via INTRAVENOUS
  Filled 2021-05-15: qty 50

## 2021-05-15 MED ORDER — CLONIDINE HCL 0.1 MG PO TABS
0.1000 mg | ORAL_TABLET | Freq: Two times a day (BID) | ORAL | Status: DC
  Administered 2021-05-15: 0.1 mg via ORAL
  Filled 2021-05-15: qty 1

## 2021-05-15 NOTE — Discharge Summary (Signed)
Physician Discharge Summary  Andrew Arnold AJG:[id number] DOB: [birth year] DOA: 05/14/2021  PCP: [name], MD  Admit date: 05/14/2021 Discharge date: 05/15/2021  Admitted From: Disposition:   Recommendations for Outpatient Follow-up:  Follow up with PCP in 1 weeks Patient will be instructed to wear Holter monitor for 30 days.  Patient will need to follow-up with PCP. Patient referred to neurology for sleep study.   Home Health:  Discharge Condition: Stable CODE STATUS: Full code  Brief Hospitalization Summary: Andrew Arnold is an obese 65 y.o. male with medical history significant for hypertension, prediabetes (hemoglobin A1c 6.4 on 08/29/2017), BPH, cervical spinal stenosis and DDD who presents to the emergency department due to syncopal episode sustained at home prior to arrival to the ED.  Patient complained of sudden loss of consciousness while placing some food in the refrigerator at home, he was unsure how long he was down, but he denies any confusion upon regaining consciousness and denies biting of tongue or any urinary incontinence.  He endorsed increased leg swelling within last 2 weeks, however he denies any symptoms prior to today's incident.  Patient denies shortness of breath on exertion or on laying in bed.   At presentation to the ED, EKG showed normal sinus rhythm at rate of 64 bpm with T wave inversion in inferior and lateral leads (not significantly changed from EKG done on 08/29/2017).  Cardiac enzymes were collected and were within normal limits.  During hospital admission patient was monitored with telemetry; no arrhythmias noted during admission.  Echocardiogram, carotid artery Doppler and lower extremity ultrasound were collected.  Echocardiogram showed EF of 65 - 70% with signs of left ventricular hypertrophy, valves grossly normal.  Both carotid and lower extremity ultrasounds were normal.  Patient reported no syncopal episodes during  admission.   Discharge Diagnoses:  Principal Problem:   Syncope Active Problems:   Essential hypertension   Macrocytic anemia   Bilateral lower extremity edema   BPH (benign prostatic hyperplasia)   Elevated PSA   Cervical spinal stenosis   DDD (degenerative disc disease), cervical   Discharge Instructions: Discharge Instructions     Diet - low sodium heart healthy   Complete by: As directed    Increase activity slowly   Complete by: As directed       Allergies as of 05/15/2021   No Known Allergies      Medication List     STOP taking these medications    aspirin 81 MG tablet   Na Sulfate-K Sulfate-Mg Sulf 17.5-3.13-1.6 GM/177ML Soln       TAKE these medications    ascorbic acid 500 MG tablet Commonly known as: VITAMIN C Take 500 mg by mouth every morning.   atorvastatin 80 MG tablet Commonly known as: LIPITOR Take 80 mg by mouth daily.   B COMPLEX 100 PO Take 1 tablet by mouth every morning.   Cetirizine HCl 10 MG Caps Take 1 capsule by mouth 2 (two) times a day.   cloNIDine 0.2 MG tablet Commonly known as: CATAPRES Take 0.2 mg by mouth 3 (three) times daily.   diltiazem 360 MG 24 hr capsule Commonly known as: TIAZAC Take 1 capsule by mouth daily.   hydrochlorothiazide 25 MG tablet Commonly known as: HYDRODIURIL Take 25 mg by mouth daily.   levofloxacin 750 MG tablet Commonly known as: Levaquin Take 1 tablet 1 hour prior to the procedure.   meloxicam 15 MG tablet Commonly known as: MOBIC Take 15 mg by mouth daily.  metFORMIN 1000 MG tablet Commonly known as: GLUCOPHAGE Take by mouth 2 (two) times daily.   potassium chloride 10 MEQ tablet Commonly known as: KLOR-CON Take 1 tablet by mouth daily.   predniSONE 20 MG tablet Commonly known as: DELTASONE Take 60 mg by mouth daily.   tamsulosin 0.4 MG Caps capsule Commonly known as: FLOMAX Take 1 capsule (0.4 mg total) by mouth daily.        Follow-up Information      [name], MD. Schedule an appointment as soon as possible for a visit in 1 week(s).   Specialty: Family Medicine Contact information: [place] [address] [phone]         Phillips Odor, MD. Schedule an appointment as soon as possible for a visit in 4 week(s).   Specialty: Neurology Contact information: [place] [address] [phone]                No Known Allergies Allergies as of 05/15/2021   No Known Allergies      Medication List     STOP taking these medications    aspirin 81 MG tablet   Na Sulfate-K Sulfate-Mg Sulf 17.5-3.13-1.6 GM/177ML Soln       TAKE these medications    ascorbic acid 500 MG tablet Commonly known as: VITAMIN C Take 500 mg by mouth every morning.   atorvastatin 80 MG tablet Commonly known as: LIPITOR Take 80 mg by mouth daily.   B COMPLEX 100 PO Take 1 tablet by mouth every morning.   Cetirizine HCl 10 MG Caps Take 1 capsule by mouth 2 (two) times a day.   cloNIDine 0.2 MG tablet Commonly known as: CATAPRES Take 0.2 mg by mouth 3 (three) times daily.   diltiazem 360 MG 24 hr capsule Commonly known as: TIAZAC Take 1 capsule by mouth daily.   hydrochlorothiazide 25 MG tablet Commonly known as: HYDRODIURIL Take 25 mg by mouth daily.   levofloxacin 750 MG tablet Commonly known as: Levaquin Take 1 tablet 1 hour prior to the procedure.   meloxicam 15 MG tablet Commonly known as: MOBIC Take 15 mg by mouth daily.   metFORMIN 1000 MG tablet Commonly known as: GLUCOPHAGE Take by mouth 2 (two) times daily.   potassium chloride 10 MEQ tablet Commonly known as: KLOR-CON Take 1 tablet by mouth daily.   predniSONE 20 MG tablet Commonly known as: DELTASONE Take 60 mg by mouth daily.   tamsulosin 0.4 MG Caps capsule Commonly known as: FLOMAX Take 1 capsule (0.4 mg total) by mouth daily.        Procedures/Studies: MR CERVICAL SPINE WO CONTRAST  Result Date:  04/29/2021 CLINICAL DATA:  Hemiplegia affecting right dominant side, unspecified etiology, unspecified hemiplegia type ([place]) G81.91 (ICD-10-CM) EXAM: MRI CERVICAL SPINE WITHOUT CONTRAST TECHNIQUE: Multiplanar, multisequence MR imaging of the cervical spine was performed. No intravenous contrast was administered. COMPARISON:  Cervical spine radiographs 03/07/2021. FINDINGS: Alignment: Straightening of the normal cervical lordosis. No substantial sagittal subluxation. Vertebrae: Vertebral body heights are maintained. No focal marrow edema to suggest acute fracture discitis/osteomyelitis. Heterogeneous bone marrow without suspicious bone lesion. Cord: Focal T2 hyperintensity in the cord at C3-C4 at the level of stenosis described below. Posterior Fossa, vertebral arteries, paraspinal tissues: Poor visualization of the left vertebral artery flow void. Disc levels: C2-C3: Posterior disc osteophyte complex with right greater than left facet and uncovertebral hypertrophy. Mild right foraminal stenosis and mild-to-moderate canal stenosis. C3-C4: Posterior disc osteophyte complex ligamentum flavum thickening and  bilateral facet uncovertebral hypertrophy. Resulting severe canal stenosis and severe bilateral foraminal stenosis. C4-C5: Posterior disc osteophyte complex with bilateral uncovertebral hypertrophy. Posterior disc contacts and deforms the left ventral cord with overall mild canal stenosis. Moderate bilateral foraminal stenosis. C5-C6: Posterior disc osteophyte complex with left paracentral disc contacting and deforming the cord. Overall mild canal stenosis. Bilateral facet uncovertebral hypertrophy with moderate moderate to severe right and moderate left foraminal stenosis. C6-C7: Posterior disc osteophyte complex with bilateral uncovertebral hypertrophy. Resulting moderate to severe right and moderate left foraminal stenosis. No significant canal stenosis. C7-T1: Posterior disc osteophyte complex with bilateral  facet uncovertebral hypertrophy. Resulting moderate to severe bilateral foraminal stenosis. No significant canal stenosis. IMPRESSION: 1. At C3-C4, severe canal stenosis with severe right greater than left foraminal stenosis. T2 hyperintensity within the cord at this level could represent edema and/or myelomalacia. 2. Moderate to severe foraminal stenosis on the right at C5-C6 and C6-C7 and bilaterally at C7-T1. Moderate foraminal stenosis bilaterally at C4-C5 and on the left at C5-C6 and C6-C7. 3. Mild-to-moderate canal stenosis at C2-C3. Mild canal stenosis C4-C5 and C5-C6 where disc contacts and flattens the ventral cord. 4. Poor visualization of the left vertebral artery flow void. This could relate to a small/non dominant left vertebral artery; however, superimposed stenosis or occlusion is difficult to exclude. A CTA neck could further evaluate. These results will be called to the ordering clinician or representative by the Radiologist Assistant, and communication documented in the PACS or [employer]. Electronically Signed   By: Margaretha Sheffield M.D.   On: 04/29/2021 16:48   US Carotid Bilateral  Result Date: 05/15/2021 CLINICAL DATA:  65 year old male with history of syncope. EXAM: BILATERAL CAROTID DUPLEX ULTRASOUND TECHNIQUE: Pearline Cables scale imaging, color Doppler and duplex ultrasound were performed of bilateral carotid and vertebral arteries in the neck. COMPARISON:  None. FINDINGS: Criteria: Quantification of carotid stenosis is based on velocity parameters that correlate the residual internal carotid diameter with NASCET-based stenosis levels, using the diameter of the distal internal carotid lumen as the denominator for stenosis measurement. The following velocity measurements were obtained: RIGHT ICA: Peak systolic velocity 62 cm/sec, End diastolic velocity 20 cm/sec CCA: Peak systolic velocity 80 cm/sec SYSTOLIC ICA/CCA RATIO:  0.8 ECA: Peak systolic velocity 64 cm/sec LEFT ICA: Peak systolic  velocity 68 cm/sec, End diastolic velocity 19 cm/sec CCA: 80 cm/sec SYSTOLIC ICA/CCA RATIO:  0.9 ECA: 58 cm/sec RIGHT CAROTID ARTERY: No atherosclerotic plaque formation. No significant tortuosity. Normal low resistance waveforms. RIGHT VERTEBRAL ARTERY:  Antegrade flow. LEFT CAROTID ARTERY: No atherosclerotic plaque formation. No significant tortuosity. Normal low resistance waveforms. LEFT VERTEBRAL ARTERY:  Not definitively visualized. Upper extremity non-invasive blood pressures: IMPRESSION: 1. Right carotid artery system: Patent without significant atherosclerotic plaque formation. 2. Left carotid artery system: Patent without significant atherosclerotic plaque formation. 3. Vertebral artery system: The left vertebral artery is not visualized. The right vertebral artery is patent with antegrade flow. Ruthann Cancer, MD [facility] Electronically Signed   By: Ruthann Cancer M.D.   On: 05/15/2021 11:13   US Venous Img Lower Right (DVT Study)  Result Date: 05/15/2021 CLINICAL DATA:  Right leg swelling EXAM: Right LOWER EXTREMITY VENOUS DOPPLER ULTRASOUND TECHNIQUE: Gray-scale sonography with compression, as well as color and duplex ultrasound, were performed to evaluate the deep venous system(s) from the level of the common femoral vein through the popliteal and proximal calf veins. COMPARISON:  None. FINDINGS: VENOUS Normal compressibility of the common femoral, superficial femoral, and  popliteal veins, as well as the visualized calf veins. Visualized portions of profunda femoral vein and great saphenous vein unremarkable. No filling defects to suggest DVT on grayscale or color Doppler imaging. Doppler waveforms show normal direction of venous flow, normal respiratory plasticity and response to augmentation. Limited views of the contralateral common femoral vein are unremarkable. OTHER None. Limitations: none IMPRESSION: Negative. Electronically Signed    By: Ofilia Neas M.D.   On: 05/15/2021 11:00   ECHOCARDIOGRAM COMPLETE  Result Date: 05/15/2021    ECHOCARDIOGRAM REPORT   Patient Name:   Andrew Arnold Date of Exam: 05/15/2021 Medical Rec #:  [id number]          Height:       69.0 in Accession #:    [id number]         Weight:       254.0 lb Date of Birth:  1956/04/17          BSA:          2.287 m Patient Age:    20 years           BP:           150/70 mmHg Patient Gender: M                  HR:           69 bpm. Exam Location:  [facility] Procedure: 2D Echo, Cardiac Doppler and Color Doppler Indications:    Syncope  History:        Patient has prior history of Echocardiogram examinations, most                 recent 11/13/2013. Risk Factors:Hypertension and Dyslipidemia.  Sonographer:    Wenda Low Referring Phys: [id number] OLADAPO ADEFESO  Sonographer Comments: Patient is morbidly obese. IMPRESSIONS  1. Left ventricular ejection fraction, by estimation, is 65 to 70%. The left ventricle has normal function. The left ventricle has no regional wall motion abnormalities. There is severe asymmetric left ventricular hypertrophy of the septal segment. Left  ventricular diastolic parameters are consistent with Grade II diastolic dysfunction (pseudonormalization). Elevated left ventricular end-diastolic pressure.  2. Right ventricular systolic function is normal. The right ventricular size is normal. There is normal pulmonary artery systolic pressure. The estimated right ventricular systolic pressure is 40.3 mmHg.  3. There is a trivial pericardial effusion posterior to the left ventricle.  4. The mitral valve is grossly normal. Mild mitral valve regurgitation.  5. The aortic valve is tricuspid. Aortic valve regurgitation is not visualized. No aortic stenosis is present. Aortic valve mean gradient measures 6.0 mmHg.  6. The inferior vena cava is normal in size with greater than 50% respiratory variability, suggesting right atrial pressure of 3 mmHg.  Comparison(s): No prior Echocardiogram. FINDINGS  Left Ventricle: Left ventricular ejection fraction, by estimation, is 65 to 70%. The left ventricle has normal function. The left ventricle has no regional wall motion abnormalities. The left ventricular internal cavity size was normal in size. There is  severe asymmetric left ventricular hypertrophy of the septal segment. Left ventricular diastolic parameters are consistent with Grade II diastolic dysfunction (pseudonormalization). Elevated left ventricular end-diastolic pressure. Right Ventricle: The right ventricular size is normal. No increase in right ventricular wall thickness. Right ventricular systolic function is normal. There is normal pulmonary artery systolic pressure. The tricuspid regurgitant velocity is 2.61 m/s, and  with an assumed right atrial pressure of 3 mmHg, the estimated right ventricular systolic pressure  is 30.2 mmHg. Left Atrium: Left atrial size was normal in size. Right Atrium: Right atrial size was normal in size. Pericardium: Trivial pericardial effusion is present. The pericardial effusion is posterior to the left ventricle. Mitral Valve: The mitral valve is grossly normal. Mild mitral valve regurgitation. MV peak gradient, 4.4 mmHg. The mean mitral valve gradient is 1.0 mmHg. Tricuspid Valve: The tricuspid valve is grossly normal. Tricuspid valve regurgitation is mild. Aortic Valve: The aortic valve is tricuspid. There is mild to moderate aortic valve annular calcification. Aortic valve regurgitation is not visualized. No aortic stenosis is present. Aortic valve mean gradient measures 6.0 mmHg. Aortic valve peak gradient measures 11.0 mmHg. Aortic valve area, by VTI measures 2.83 cm. Pulmonic Valve: The pulmonic valve was grossly normal. Pulmonic valve regurgitation is trivial. Aorta: The aortic root is normal in size and structure. Venous: The inferior vena cava is normal in size with greater than 50% respiratory variability,  suggesting right atrial pressure of 3 mmHg. IAS/Shunts: No atrial level shunt detected by color flow Doppler.  LEFT VENTRICLE PLAX 2D LVIDd:         4.20 cm     Diastology LVIDs:         2.60 cm     LV e' medial:    4.68 cm/s LV PW:         1.90 cm     LV E/e' medial:  17.7 LV IVS:        2.00 cm     LV e' lateral:   5.11 cm/s LVOT diam:     2.10 cm     LV E/e' lateral: 16.2 LV SV:         113 LV SV Index:   49 LVOT Area:     3.46 cm  LV Volumes (MOD) LV vol d, MOD A2C: 38.6 ml LV vol d, MOD A4C: 80.6 ml LV vol s, MOD A2C: 18.5 ml LV vol s, MOD A4C: 31.8 ml LV SV MOD A2C:     20.1 ml LV SV MOD A4C:     80.6 ml LV SV MOD BP:      35.6 ml RIGHT VENTRICLE RV Basal diam:  3.30 cm RV Mid diam:    3.00 cm RV S prime:     17.60 cm/s TAPSE (M-mode): 2.8 cm LEFT ATRIUM             Index        RIGHT ATRIUM           Index LA diam:        3.20 cm 1.40 cm/m   RA Area:     13.40 cm LA Vol (A2C):   47.7 ml 20.86 ml/m  RA Volume:   29.50 ml  12.90 ml/m LA Vol (A4C):   43.4 ml 18.98 ml/m LA Biplane Vol: 45.7 ml 19.98 ml/m  AORTIC VALVE                     PULMONIC VALVE AV Area (Vmax):    2.78 cm      PV Vmax:       1.05 m/s AV Area (Vmean):   2.72 cm      PV Peak grad:  4.4 mmHg AV Area (VTI):     2.83 cm AV Vmax:           166.00 cm/s AV Vmean:          119.000 cm/s AV VTI:  0.398 m AV Peak Grad:      11.0 mmHg AV Mean Grad:      6.0 mmHg LVOT Vmax:         133.00 cm/s LVOT Vmean:        93.400 cm/s LVOT VTI:          0.325 m LVOT/AV VTI ratio: 0.82  AORTA Ao Root diam: 3.70 cm MITRAL VALVE               TRICUSPID VALVE MV Area (PHT): 3.60 cm    TR Peak grad:   27.2 mmHg MV Area VTI:   3.51 cm    TR Vmax:        261.00 cm/s MV Peak grad:  4.4 mmHg MV Mean grad:  1.0 mmHg    SHUNTS MV Vmax:       1.05 m/s    Systemic VTI:  0.32 m MV Vmean:      48.8 cm/s   Systemic Diam: 2.10 cm MV Decel Time: 211 msec MV E velocity: 82.70 cm/s MV A velocity: 90.00 cm/s MV E/A ratio:  0.92 Rozann Lesches MD Electronically  signed by Rozann Lesches MD Signature Date/Time: 05/15/2021/11:44:39 AM    Final      Subjective:   Discharge Exam: Vitals:   05/15/21 0438 05/15/21 0821  BP: (!) 178/87 (!) 150/70  Pulse: 76 69  Resp: 19 20  Temp: 98 F (36.7 C) 98.9 F (37.2 C)  SpO2: 100% 100%   Vitals:   05/15/21 0130 05/15/21 0211 05/15/21 0438 05/15/21 0821  BP: (!) 143/83 (!) 173/98 (!) 178/87 (!) 150/70  Pulse: (!) 59 65 76 69  Resp: 18 20 19 20   Temp: 98.2 F (36.8 C) 98.3 F (36.8 C) 98 F (36.7 C) 98.9 F (37.2 C)  TempSrc: Oral   Oral  SpO2: 100% 100% 100% 100%  Weight:      Height:        General: Pt is alert, awake, not in acute distress Cardiovascular: RRR, S1/S2 +, no rubs, no gallops Respiratory: CTA bilaterally, no wheezing, no rhonchi Abdominal: Soft, NT, ND, bowel sounds + Extremities: no edema, no cyanosis   The results of significant diagnostics from this hospitalization (including imaging, microbiology, ancillary and laboratory) are listed below for reference.     Microbiology: Recent Results (from the past 240 hour(s))  Resp Panel by RT-PCR (Flu A&B, Covid) Urine, Clean Catch     Status: None   Collection Time: 05/14/21 10:45 PM   Specimen: Urine, Clean Catch; Nasopharyngeal(NP) swabs in vial transport medium  Result Value Ref Range Status   SARS Coronavirus 2 by RT PCR NEGATIVE NEGATIVE Final    Comment: (NOTE) SARS-CoV-2 target nucleic acids are NOT DETECTED.  The SARS-CoV-2 RNA is generally detectable in upper respiratory specimens during the acute phase of infection. The lowest concentration of SARS-CoV-2 viral copies this assay can detect is 138 copies/mL. A negative result does not preclude SARS-Cov-2 infection and should not be used as the sole basis for treatment or other patient management decisions. A negative result may occur with  improper specimen collection/handling, submission of specimen other than nasopharyngeal swab, presence of viral mutation(s)  within the areas targeted by this assay, and inadequate number of viral copies(<138 copies/mL). A negative result must be combined with clinical observations, patient history, and epidemiological information. The expected result is Negative.  Fact Sheet for Patients:  EntrepreneurPulse.com.au  Fact Sheet for Healthcare Providers:  IncredibleEmployment.be  This test is no  t yet approved or cleared by the [place] and  has been authorized for detection and/or diagnosis of SARS-CoV-2 by FDA under an Emergency Use Authorization (EUA). This EUA will remain  in effect (meaning this test can be used) for the duration of the COVID-19 declaration under Section 564(b)(1) of the Act, 21 U.S.C.section 360bbb-3(b)(1), unless the authorization is terminated  or revoked sooner.       Influenza A by PCR NEGATIVE NEGATIVE Final   Influenza B by PCR NEGATIVE NEGATIVE Final    Comment: (NOTE) The Xpert Xpress SARS-CoV-2/FLU/RSV plus assay is intended as an aid in the diagnosis of influenza from Nasopharyngeal swab specimens and should not be used as a sole basis for treatment. Nasal washings and aspirates are unacceptable for Xpert Xpress SARS-CoV-2/FLU/RSV testing.  Fact Sheet for Patients: EntrepreneurPulse.com.au  Fact Sheet for Healthcare Providers: IncredibleEmployment.be  This test is not yet approved or cleared by the [place] FDA and has been authorized for detection and/or diagnosis of SARS-CoV-2 by FDA under an Emergency Use Authorization (EUA). This EUA will remain in effect (meaning this test can be used) for the duration of the COVID-19 declaration under Section 564(b)(1) of the Act, 21 U.S.C. section 360bbb-3(b)(1), unless the authorization is terminated or revoked.  Performed at [facility], [address] [place], [place] [address]      Labs: BNP (last 3 results) Recent Labs     05/14/21 2027  BNP 78.9   Basic Metabolic Panel: Recent Labs  Lab 05/14/21 2027 05/15/21 0532  NA 137 140  K 3.6 3.8  CL 102 102  CO2 29 29  GLUCOSE 124* 136*  BUN 23 20  CREATININE 0.91 0.88  CALCIUM 9.1 9.2  PHOS  --  3.5   Liver Function Tests: Recent Labs  Lab 05/15/21 0532  AST 14*  ALT 28  ALKPHOS 60  BILITOT 0.5  PROT 6.1*  ALBUMIN 3.4*   No results for input(s): LIPASE, AMYLASE in the last 168 hours. No results for input(s): AMMONIA in the last 168 hours. CBC: Recent Labs  Lab 05/14/21 2027 05/15/21 0532  WBC 6.1 6.9  HGB 12.9* 12.0*  HCT 38.6* 37.5*  MCV 100.8* 102.2*  PLT 169 175   Cardiac Enzymes: No results for input(s): CKTOTAL, CKMB, CKMBINDEX, TROPONINI in the last 168 hours. BNP: Invalid input(s): POCBNP CBG: Recent Labs  Lab 05/14/21 2136  GLUCAP 106*   D-Dimer No results for input(s): DDIMER in the last 72 hours. Hgb A1c No results for input(s): HGBA1C in the last 72 hours. Lipid Profile No results for input(s): CHOL, HDL, LDLCALC, TRIG, CHOLHDL, LDLDIRECT in the last 72 hours. Thyroid function studies No results for input(s): TSH, T4TOTAL, T3FREE, THYROIDAB in the last 72 hours.  Invalid input(s): FREET3 Anemia work up Recent Labs    05/15/21 0532  VITAMINB12 220  FOLATE 17.4   Urinalysis    Component Value Date/Time   COLORURINE YELLOW 05/14/2021 2012   APPEARANCEUR CLEAR 05/14/2021 2012   APPEARANCEUR Clear 04/10/2021 1518   LABSPEC 1.013 05/14/2021 2012   PHURINE 5.0 05/14/2021 2012   GLUCOSEU NEGATIVE 05/14/2021 2012   HGBUR NEGATIVE 05/14/2021 2012   BILIRUBINUR NEGATIVE 05/14/2021 2012   BILIRUBINUR Negative 04/10/2021 [place] 05/14/2021 2012   PROTEINUR NEGATIVE 05/14/2021 2012   UROBILINOGEN 1.0 11/13/2013 0635   NITRITE NEGATIVE 05/14/2021 2012   LEUKOCYTESUR NEGATIVE 05/14/2021 2012   Sepsis Labs Invalid input(s): PROCALCITONIN,  WBC,  LACTICIDVEN Microbiology Recent Results (from  the past 240 hour(s))  Resp Panel by RT-PCR (Flu A&B, Covid) Urine, Clean Catch     Status: None   Collection Time: 05/14/21 10:45 PM   Specimen: Urine, Clean Catch; Nasopharyngeal(NP) swabs in vial transport medium  Result Value Ref Range Status   SARS Coronavirus 2 by RT PCR NEGATIVE NEGATIVE Final    Comment: (NOTE) SARS-CoV-2 target nucleic acids are NOT DETECTED.  The SARS-CoV-2 RNA is generally detectable in upper respiratory specimens during the acute phase of infection. The lowest concentration of SARS-CoV-2 viral copies this assay can detect is 138 copies/mL. A negative result does not preclude SARS-Cov-2 infection and should not be used as the sole basis for treatment or other patient management decisions. A negative result may occur with  improper specimen collection/handling, submission of specimen other than nasopharyngeal swab, presence of viral mutation(s) within the areas targeted by this assay, and inadequate number of viral copies(<138 copies/mL). A negative result must be combined with clinical observations, patient history, and epidemiological information. The expected result is Negative.  Fact Sheet for Patients:  EntrepreneurPulse.com.au  Fact Sheet for Healthcare Providers:  IncredibleEmployment.be  This test is no t yet approved or cleared by the [place] FDA and  has been authorized for detection and/or diagnosis of SARS-CoV-2 by FDA under an Emergency Use Authorization (EUA). This EUA will remain  in effect (meaning this test can be used) for the duration of the COVID-19 declaration under Section 564(b)(1) of the Act, 21 U.S.C.section 360bbb-3(b)(1), unless the authorization is terminated  or revoked sooner.       Influenza A by PCR NEGATIVE NEGATIVE Final   Influenza B by PCR NEGATIVE NEGATIVE Final    Comment: (NOTE) The Xpert Xpress SARS-CoV-2/FLU/RSV plus assay is intended as an aid in the diagnosis of  influenza from Nasopharyngeal swab specimens and should not be used as a sole basis for treatment. Nasal washings and aspirates are unacceptable for Xpert Xpress SARS-CoV-2/FLU/RSV testing.  Fact Sheet for Patients: EntrepreneurPulse.com.au  Fact Sheet for Healthcare Providers: IncredibleEmployment.be  This test is not yet approved or cleared by the [place] FDA and has been authorized for detection and/or diagnosis of SARS-CoV-2 by FDA under an Emergency Use Authorization (EUA). This EUA will remain in effect (meaning this test can be used) for the duration of the COVID-19 declaration under Section 564(b)(1) of the Act, 21 U.S.C. section 360bbb-3(b)(1), unless the authorization is terminated or revoked.  Performed at [facility], [address] [place],  [address]     Time coordinating discharge:   SIGNED:  Gerrit Halls, MS4   05/15/2021, 12:18 PM

## 2021-05-15 NOTE — Telephone Encounter (Signed)
Forestine Na called to let office know pt has been admitted into the hospital.  No biopsy can be done today as scheduled.  Thanks, Helene Kelp

## 2021-05-15 NOTE — Telephone Encounter (Signed)
Received a secure chat from Dr. Manuella Ghazi requesting a 30 day monitor for syncope. Pt enrolled in [employer]. Order placed.

## 2021-05-15 NOTE — Progress Notes (Signed)
*  PRELIMINARY RESULTS* Echocardiogram 2D Echocardiogram has been performed.  Andrew Arnold 05/15/2021, 9:54 AM

## 2021-05-15 NOTE — Progress Notes (Deleted)
PROGRESS NOTE   [name]  OQH:[id number] DOB: July 05, 1956 DOA: 05/14/2021 PCP: Andrew Evens, MD   Chief Complaint  Patient presents with   Loss of Consciousness   Level of care: Telemetry  Brief Admission History:  Andrew Arnold is an obese 65 y.o. male with medical history significant for hypertension, prediabetes (hemoglobin A1c 6.4 on 08/29/2017), BPH, cervical spinal stenosis and DDD who presents to the emergency department due to syncopal episode sustained at home prior to arrival to the ED.  Patient complained of sudden loss of consciousness while placing some food in the refrigerator at home, he was unsure how long he was down, but he denies any confusion upon regaining consciousness and denies biting of tongue or any urinary incontinence.  He endorsed increased leg swelling within last 2 weeks, however he denies any symptoms prior to today's incident.  Patient denies shortness of breath on exertion or on laying in bed.  He was scheduled to have prostate biopsy with Dr. Jeffie Arnold at the surgery here tomorrow and patient states that he has been following neurology due to right-sided weakness thought to be due to cervical spinal stenosis and DDD.   Assessment & Plan:   Principal Problem:   Syncope Active Problems:   Essential hypertension   Macrocytic anemia   Bilateral lower extremity edema   BPH (benign prostatic hyperplasia)   Elevated PSA   Cervical spinal stenosis   DDD (degenerative disc disease), cervical   Acute syncopal episode - Continue telemetry and watch for arrhythmias - Echocardiogram done on 11/13/2013 showed LVEF of 60 to 65%.  Left ventricular has grade 1 diastolic dysfunction - Echocardiogram and Carotid artery Dopplers pending  Bilateral lower extremity edema ( R > L ) - Lower extremity US today  Elevated PSA - Pt. was scheduled to have  a prostate biopsy with Dr. Judyann Arnold. -Informed Dr. Ralene Arnold Office that patient is currently admitted to  the hospital.    Essential hypertension - Continue clonidine per home regimen  BPH with LUTS - Continue Flomax  Macrocytic anemia - Folate and vitamin B12 levels will be checked   Cervical spinal stenosis and DDD - Stable, patient follows with outpatient neurology       DVT prophylaxis:  SCDs Code Status: full code Family Communication: n/a  Disposition:  Status is: Observation  The patient remains OBS appropriate and will d/c before 2 midnights.      Consultants:  none  Procedures:  N/A  Antimicrobials:  N/A   Subjective: Patient alert and oriented this morning. He states that he feels well this morning.  He denies any additional syncopal episodes last night.  Patient denies chest pain or palpitations.  Patient is aware of plan to get echo, carotid Doppler, and lower extremity ultrasound.  Patient is agreeable with this plan.  Objective: Vitals:   05/15/21 0000 05/15/21 0130 05/15/21 0211 05/15/21 0438  BP: 124/68 (!) 143/83 (!) 173/98 (!) 178/87  Pulse: 62 (!) 59 65 76  Resp: 17 18 20 19   Temp:  98.2 F (36.8 C) 98.3 F (36.8 C) 98 F (36.7 C)  TempSrc:  Oral    SpO2: 100% 100% 100% 100%  Weight:      Height:        Intake/Output Summary (Last 24 hours) at 05/15/2021 0759 Last data filed at 05/15/2021 0236 Gross per 24 hour  Intake 0 ml  Output --  Net 0 ml   Filed Weights   05/14/21 2010  Weight: 115.2  kg    Examination:  General exam: Appears calm and comfortable  Respiratory system: Clear to auscultation. Respiratory effort normal. Cardiovascular system: normal S1 & S2 heard. No JVD, murmurs, rubs, gallops or clicks. No pedal edema. Gastrointestinal system: Abdomen is nondistended, soft and nontender. No organomegaly or masses felt. Normal bowel sounds heard. Central nervous system: Alert and oriented. No focal neurological deficits. Extremities: Symmetric 5 x 5 power. Skin: No rashes, lesions or ulcers Psychiatry: Judgement and  insight appear normal.  Alert and oriented x3.  Mood & affect appropriate.   Data Reviewed: I have personally reviewed following labs and imaging studies  CBC: Recent Labs  Lab 05/14/21 2027 05/15/21 0532  WBC 6.1 6.9  HGB 12.9* 12.0*  HCT 38.6* 37.5*  MCV 100.8* 102.2*  PLT 169 329    Basic Metabolic Panel: Recent Labs  Lab 05/14/21 2027 05/15/21 0532  NA 137 140  K 3.6 3.8  CL 102 102  CO2 29 29  GLUCOSE 124* 136*  BUN 23 20  CREATININE 0.91 0.88  CALCIUM 9.1 9.2  PHOS  --  3.5    GFR: Estimated Creatinine Clearance: 102 mL/min (by C-G formula based on SCr of 0.88 mg/dL).  Liver Function Tests: Recent Labs  Lab 05/15/21 0532  AST 14*  ALT 28  ALKPHOS 60  BILITOT 0.5  PROT 6.1*  ALBUMIN 3.4*    CBG: Recent Labs  Lab 05/14/21 2136  GLUCAP 106*    Recent Results (from the past 240 hour(s))  Resp Panel by RT-PCR (Flu A&B, Covid) Urine, Clean Catch     Status: None   Collection Time: 05/14/21 10:45 PM   Specimen: Urine, Clean Catch; Nasopharyngeal(NP) swabs in vial transport medium  Result Value Ref Range Status   SARS Coronavirus 2 by RT PCR NEGATIVE NEGATIVE Final    Comment: (NOTE) SARS-CoV-2 target nucleic acids are NOT DETECTED.  The SARS-CoV-2 RNA is generally detectable in upper respiratory specimens during the acute phase of infection. The lowest concentration of SARS-CoV-2 viral copies this assay can detect is 138 copies/mL. A negative result does not preclude SARS-Cov-2 infection and should not be used as the sole basis for treatment or other patient management decisions. A negative result may occur with  improper specimen collection/handling, submission of specimen other than nasopharyngeal swab, presence of viral mutation(s) within the areas targeted by this assay, and inadequate number of viral copies(<138 copies/mL). A negative result must be combined with clinical observations, patient history, and epidemiological information. The  expected result is Negative.  Fact Sheet for Patients:  EntrepreneurPulse.com.au  Fact Sheet for Healthcare Providers:  IncredibleEmployment.be  This test is no t yet approved or cleared by the [place] FDA and  has been authorized for detection and/or diagnosis of SARS-CoV-2 by FDA under an Emergency Use Authorization (EUA). This EUA will remain  in effect (meaning this test can be used) for the duration of the COVID-19 declaration under Section 564(b)(1) of the Act, 21 U.S.C.section 360bbb-3(b)(1), unless the authorization is terminated  or revoked sooner.       Influenza A by PCR NEGATIVE NEGATIVE Final   Influenza B by PCR NEGATIVE NEGATIVE Final    Comment: (NOTE) The Xpert Xpress SARS-CoV-2/FLU/RSV plus assay is intended as an aid in the diagnosis of influenza from Nasopharyngeal swab specimens and should not be used as a sole basis for treatment. Nasal washings and aspirates are unacceptable for Xpert Xpress SARS-CoV-2/FLU/RSV testing.  Fact Sheet for Patients: EntrepreneurPulse.com.au  Fact Sheet for Healthcare  Providers: IncredibleEmployment.be  This test is not yet approved or cleared by the [place] and has been authorized for detection and/or diagnosis of SARS-CoV-2 by FDA under an Emergency Use Authorization (EUA). This EUA will remain in effect (meaning this test can be used) for the duration of the COVID-19 declaration under Section 564(b)(1) of the Act, 21 U.S.C. section 360bbb-3(b)(1), unless the authorization is terminated or revoked.  Performed at [facility], [address], [place], [place] [address]      Radiology Studies: No results found.  Scheduled Meds:  cloNIDine  0.1 mg Oral BID   lidocaine HCl (PF)       lidocaine HCl (PF)       tamsulosin  0.4 mg Oral Daily   Continuous Infusions:   LOS: 0 days   Time spent: [address]   Gerrit Halls,  MS4  05/15/2021, 7:59 AM

## 2021-05-15 NOTE — Progress Notes (Signed)
Patient off unit for US

## 2021-05-19 DIAGNOSIS — M4712 Other spondylosis with myelopathy, cervical region: Secondary | ICD-10-CM | POA: Diagnosis not present

## 2021-05-20 DIAGNOSIS — I10 Essential (primary) hypertension: Secondary | ICD-10-CM | POA: Diagnosis not present

## 2021-05-20 DIAGNOSIS — R55 Syncope and collapse: Secondary | ICD-10-CM | POA: Diagnosis not present

## 2021-05-20 DIAGNOSIS — E1165 Type 2 diabetes mellitus with hyperglycemia: Secondary | ICD-10-CM | POA: Diagnosis not present

## 2021-05-20 DIAGNOSIS — M5410 Radiculopathy, site unspecified: Secondary | ICD-10-CM | POA: Diagnosis not present

## 2021-05-20 DIAGNOSIS — M4802 Spinal stenosis, cervical region: Secondary | ICD-10-CM | POA: Diagnosis not present

## 2021-05-21 ENCOUNTER — Ambulatory Visit (HOSPITAL_COMMUNITY)
Admission: RE | Admit: 2021-05-21 | Discharge: 2021-05-21 | Disposition: A | Discharge status: Home / Self Care | Payer: BC Managed Care – PPO | Source: Ambulatory Visit | Attending: Internal Medicine | Admitting: Internal Medicine

## 2021-05-21 ENCOUNTER — Other Ambulatory Visit (HOSPITAL_COMMUNITY): Payer: Self-pay | Admitting: Internal Medicine

## 2021-05-21 ENCOUNTER — Encounter (HOSPITAL_COMMUNITY): Payer: Self-pay | Admitting: Internal Medicine

## 2021-05-21 VITALS — BP 132/76 | HR 99 | Wt 252.2 lb

## 2021-05-21 DIAGNOSIS — E119 Type 2 diabetes mellitus without complications: Secondary | ICD-10-CM | POA: Insufficient documentation

## 2021-05-21 DIAGNOSIS — I5032 Chronic diastolic (congestive) heart failure: Secondary | ICD-10-CM

## 2021-05-21 DIAGNOSIS — Z7984 Long term (current) use of oral hypoglycemic drugs: Secondary | ICD-10-CM | POA: Diagnosis not present

## 2021-05-21 DIAGNOSIS — R55 Syncope and collapse: Secondary | ICD-10-CM

## 2021-05-21 DIAGNOSIS — E785 Hyperlipidemia, unspecified: Secondary | ICD-10-CM | POA: Diagnosis not present

## 2021-05-21 DIAGNOSIS — M4802 Spinal stenosis, cervical region: Secondary | ICD-10-CM | POA: Insufficient documentation

## 2021-05-21 DIAGNOSIS — I10 Essential (primary) hypertension: Secondary | ICD-10-CM

## 2021-05-21 DIAGNOSIS — E7849 Other hyperlipidemia: Secondary | ICD-10-CM

## 2021-05-21 DIAGNOSIS — Z79899 Other long term (current) drug therapy: Secondary | ICD-10-CM | POA: Diagnosis not present

## 2021-05-21 DIAGNOSIS — I110 Hypertensive heart disease with heart failure: Secondary | ICD-10-CM | POA: Insufficient documentation

## 2021-05-21 DIAGNOSIS — G8191 Hemiplegia, unspecified affecting right dominant side: Secondary | ICD-10-CM | POA: Diagnosis not present

## 2021-05-21 DIAGNOSIS — N400 Enlarged prostate without lower urinary tract symptoms: Secondary | ICD-10-CM | POA: Insufficient documentation

## 2021-05-21 DIAGNOSIS — E1100 Type 2 diabetes mellitus with hyperosmolarity without nonketotic hyperglycemic-hyperosmolar coma (NKHHC): Secondary | ICD-10-CM

## 2021-05-21 MED ORDER — FUROSEMIDE 20 MG PO TABS: 20.0000 mg | ORAL_TABLET | Freq: Every day | ORAL | 3 refills | Status: DC

## 2021-05-21 NOTE — H&P (View-Only) (Signed)
ADVANCED HF CLINIC CONSULT NOTE  Referring Physician: Dr Karie Kirks  Primary Care: Dr Karie Kirks  Primary Cardiologist: None  Neuro Surgeon: Dr Marcello Moores   HPI: Andrew Arnold is a 65 year old with history of sycnope, HTN, DMII, hyperlipidemia, BPH, DDD, cervical spinal stenosis.    Followed by neuro surgery for RUE/LUE weakness.. CT cervical spine with C3-C4, severe canal stenosis with severe right greater than left foraminal stenosis. T2 hyperintensity within the cord at this level could represent edema and/or myelomalacia.2. Moderate to severe foraminal stenosis on the right at C5-C6 and C6-C7 and bilaterally at C7-T1. Moderate foraminal stenosis bilaterally at C4-C5 and on the left at C5-C6 and C6-C7.3. Mild-to-moderate canal stenosis at C2-C3. Mild canal stenosis. C4-C5 and C5-C6 where disc contacts and flattens the ventral cord.  On 05/15/2021 presented ED with syncope. Standing in front of the refrigerator then woke up on the floor. Also complained of leg edema for 2 weeks. HIV NR.  EKG SR 64 bpm. ECHO EF 65-70%, LVH, grade II DD, and normal RV. Carotids  nd lower extremity dopplers normal. Discharged the same day.  Outpatient monitor recommended and requested. He  has not received monitor.   Presented today as a new patient. Lower extremity swelling about 3 weeks ago. No further syncope.  Uses motorized scooter in the store. He has RLE weakness. RUE with numbness. 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    Review of Systems: [y] = yes, [ ]  = no   General: Weight gain [ ] ; Weight loss [ ] ; Anorexia [ ] ; Fatigue [Y ]; Fever [ ] ; Chills [ ] ; Weakness [ ]   Cardiac: Chest pain/pressure [ ] ; Resting SOB [ ] ; Exertional SOB [ ] ; Orthopnea [ ] ; Pedal Edema [ Y]; Palpitations [ ] ; Syncope [Y ]; Presyncope [ ] ; Paroxysmal nocturnal dyspnea[ ]   Pulmonary: Cough [ ] ; Wheezing[ ] ; Hemoptysis[ ] ; Sputum [ ] ; Snoring [ ]   GI: Vomiting[ ] ; Dysphagia[ ] ; Melena[ ] ; Hematochezia [ ] ; Heartburn[ ] ;  Abdominal pain [ ] ; Constipation [ ] ; Diarrhea [ ] ; BRBPR [ ]   GU: Hematuria[ ] ; Dysuria [ ] ; Nocturia[ ]   Vascular: Pain in legs with walking [Y ]; Pain in feet with lying flat [ ] ; Non-healing sores [ ] ; Stroke [ ] ; TIA [ ] ; Slurred speech [ ] ;  Neuro: Headaches[ ] ; Vertigo[ ] ; Seizures[ ] ; Paresthesias[ ] ;Blurred vision [ ] ; Diplopia [ ] ; Vision changes [ ]   Ortho/Skin: Arthritis [ ] ; Joint pain [ Y]; Muscle pain [ ] ; Joint swelling [ ] ; Back Pain [ Y]; Rash [ ]   Psych: Depression[ ] ; Anxiety[ ]   Heme: Bleeding problems [ ] ; Clotting disorders [ ] ; Anemia [ ]   Endocrine: Diabetes [ Y]; Thyroid dysfunction[ ]    Past Medical History:  Diagnosis Date   Allergic rhinitis due to pollen    on allergy shots   Arrhythmia    1980s hospitalized for HTN and ?irregular rhythm but no specific dx but resolved during hospitalization   HTN (hypertension)    Hyperlipidemia     Current Outpatient Medications  Medication Sig Dispense Refill   ascorbic acid (VITAMIN C) 500 MG tablet Take 500 mg by mouth every morning.      atorvastatin (LIPITOR) 80 MG tablet Take 80 mg by mouth daily.     B Complex Vitamins (B COMPLEX 100 PO) Take 1 tablet by mouth every morning.      Cetirizine HCl 10 MG CAPS Take 1 capsule by mouth 2 (two) times a  day.      cloNIDine (CATAPRES) 0.2 MG tablet Take 0.2 mg by mouth 3 (three) times daily.     diltiazem (TIAZAC) 360 MG 24 hr capsule Take 1 capsule by mouth daily.     hydrochlorothiazide (HYDRODIURIL) 25 MG tablet Take 25 mg by mouth daily.     metFORMIN (GLUCOPHAGE) 1000 MG tablet Take by mouth 2 (two) times daily.     potassium chloride (K-DUR) 10 MEQ tablet Take 1 tablet by mouth daily.  1   predniSONE (DELTASONE) 20 MG tablet Take 60 mg by mouth daily.     tamsulosin (FLOMAX) 0.4 MG CAPS capsule Take 1 capsule (0.4 mg total) by mouth daily. 30 capsule 11   levofloxacin (LEVAQUIN) 750 MG tablet Take 1 tablet 1 hour prior to the procedure. (Patient not taking: Reported  on 05/21/2021) 1 tablet 0   No current facility-administered medications for this encounter.    No Known Allergies    Social History   Socioeconomic History   Marital status: Single    Spouse name: Not on file   Number of children: Not on file   Years of education: Not on file   Highest education level: Not on file  Occupational History   Occupation: school counselor  Tobacco Use   Smoking status: Never   Smokeless tobacco: Never  Vaping Use   Vaping Use: Never used  Substance and Sexual Activity   Alcohol use: No   Drug use: No   Sexual activity: Yes  Other Topics Concern   Not on file  Social History Narrative   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[place], [place]   Lives in [place] Determinants of Health   Financial Resource Strain: Not on file  Food Insecurity: Not on file  Transportation Needs: Not on file  Physical Activity: Not on file  Stress: Not on file  Social Connections: Not on file  Intimate Partner Violence: Not on file      Family History  Problem Relation Age of Onset   Cancer Mother    Cirrhosis Father        cirrhosis of the liver; alcohol related   Other Other        neice was on allergy vaccine when young   Colon cancer Neg Hx     Vitals:   05/21/21 1525  BP: 132/76  Pulse: 99  SpO2: (!) 75%  Weight: 114.4 kg (252 lb 3.2 oz)   Reds Clip 33%.   PHYSICAL EXAM: General:  Well appearing. No respiratory difficulty HEENT: normal Neck: supple. no JVD. Carotids 2+ bilat; no bruits. No lymphadenopathy or thryomegaly appreciated. Cor: PMI nondisplaced. Regular rate & rhythm. No rubs, gallops or murmurs. Lungs: clear Abdomen: soft, nontender, nondistended. No hepatosplenomegaly. No bruits or masses. Good bowel sounds. Extremities: no cyanosis, clubbing, rash, R and LLE 1+ edema. edema Neuro: alert & oriented x 3, cranial nerves grossly intact. moves all 4 extremities w/o difficulty. Affect pleasant.  ECG: SR 72 bpm ST depression 2,  3, AVF and V4 VF    ASSESSMENT & PLAN: Pre Op Clearance -Here for cardiac clearance for cervical surgery. Functional status limited due to cervical issues. -EKG concerning for ischemia. No chest pain. H/O hyperlipidemia, syncope, and DMII. Will set up for LHC/RHC to further assess hemodynamics/coronaries.   - Hold surgical clearance until after cath.   2.Syncope  -Had syncopal episode 05/14/21 . Unclear etiology.  -Evaluated in the ED -Carotid  Dopplers negative. Echo Ef 65-70%  -No further episode. Place Zio AT today.   3. Chronic HFpEF -05/15/21 Echo EF 65-70% RV normal Elevated LVEDP.  - Add 20 mg lasix daily. Add compression stockings.  - Continue current dose HCTZ  4. HTN  Stable today  - Continue current regimen.   5.  Hyperlipidemia  On Statin   5. DMII - On metformin - Start SGLT2i next visit.   Obtain precath labs.   Follow post cath 2-3 weeks with Dr Haroldine Laws. Delay surgical clearance until after heart cath.  The patient understands that risks included but are not limited to stroke (1 in 1000), death (1 in 75), kidney failure [usually temporary] (1 in 500), bleeding (1 in 200), allergic reaction [possibly serious] (1 in 200).  The patient understands and agrees to proceed.   Andrew Thrush NP-C  5:35 PM

## 2021-05-21 NOTE — Patient Instructions (Addendum)
EKG done today.  RedsClip done today.   No Labs done today.   START FUROSEMIDE (LASIX) 20 MG DAILY  Your provider has recommended that  you wear a Zio Patch for 14 days.  This monitor will record your heart rhythm for our review.  IF you have any symptoms while wearing the monitor please press the button.  If you have any issues with the patch or you notice a red or orange light on it please call the company at [phone].  Once you remove the patch please mail it back to the company as soon as possible so we can get the results.  Your physician recommends that you schedule a follow-up appointment in: 3 weeks with Dr. Haroldine Laws  If you have any questions or concerns before your next appointment please send [place] a message through [facility] or call our office at [phone].    TO LEAVE A MESSAGE FOR THE NURSE SELECT OPTION 2, PLEASE LEAVE A MESSAGE INCLUDING: YOUR NAME DATE OF BIRTH CALL BACK NUMBER REASON FOR CALL**this is important as we prioritize the call backs  YOU WILL RECEIVE A CALL BACK THE SAME DAY AS LONG AS YOU CALL BEFORE 4:00 PM   Do the following things EVERYDAY: Weigh yourself in the morning before breakfast. Write it down and keep it in a log. Take your medicines as prescribed Eat low salt foods--Limit salt (sodium) to 2000 mg per day.  Stay as active as you can everyday Limit all fluids for the day to less than 2 liters   At the [facility], you and your health needs are our priority. As part of our continuing mission to provide you with exceptional heart care, we have created designated Provider Care Teams. These Care Teams include your primary Cardiologist (physician) and Advanced Practice Providers (APPs- Physician Assistants and Nurse Practitioners) who all work together to provide you with the care you need, when you need it.   You may see any of the following providers on your designated Care Team at your next follow up: Dr Glori Bickers Dr Haynes Kerns, NP Lyda Jester, [place] Audry Riles, PharmD   Please be sure to bring in all your medications bottles to every appointment.   You are scheduled for a Cardiac Catheterization on Friday, November 11 with Dr. Glori Bickers.  1. Please arrive at the [facility] (Main Entrance A) at [facility]: [address] [place], [place] [address] at 5:30 AM (This time is two hours before your procedure to ensure your preparation). Free valet parking service is available.   Special note: Every effort is made to have your procedure done on time. Please understand that emergencies sometimes delay scheduled procedures.  2. Diet: Do not eat solid foods after midnight.  The patient may have clear liquids until 5am upon the day of the procedure.  4. Medication instructions in preparation for your procedure  Do not take Diabetes Med Glucophage (Metformin) on the day of the procedure and HOLD 48 HOURS AFTER THE PROCEDURE.  On the morning of your procedure, any morning medicines NOT listed above.  You may use sips of water.  5. Plan for one night stay--bring personal belongings. 6. Bring a current list of your medications and current insurance cards. 7. You MUST have a responsible person to drive you home. 8. Someone MUST be with you the first 24 hours after you arrive home or your discharge will be delayed. 9. Please wear clothes that are  easy to get on and off and wear slip-on shoes.  Thank you for allowing [place] to care for you!   -- [place] [facility]

## 2021-05-21 NOTE — Progress Notes (Signed)
ADVANCED HF CLINIC CONSULT NOTE  Referring Physician: Dr Karie Kirks  Primary Care: Dr Karie Kirks  Primary Cardiologist: None  Neuro Surgeon: Dr Marcello Moores   HPI: Mr Hohmann is a 65 year old with history of sycnope, HTN, DMII, hyperlipidemia, BPH, DDD, cervical spinal stenosis.    Followed by neuro surgery for RUE/LUE weakness.. CT cervical spine with C3-C4, severe canal stenosis with severe right greater than left foraminal stenosis. T2 hyperintensity within the cord at this level could represent edema and/or myelomalacia.2. Moderate to severe foraminal stenosis on the right at C5-C6 and C6-C7 and bilaterally at C7-T1. Moderate foraminal stenosis bilaterally at C4-C5 and on the left at C5-C6 and C6-C7.3. Mild-to-moderate canal stenosis at C2-C3. Mild canal stenosis. C4-C5 and C5-C6 where disc contacts and flattens the ventral cord.  On 05/15/2021 presented ED with syncope. Standing in front of the refrigerator then woke up on the floor. Also complained of leg edema for 2 weeks. HIV NR.  EKG SR 64 bpm. ECHO EF 65-70%, LVH, grade II DD, and normal RV. Carotids  nd lower extremity dopplers normal. Discharged the same day.  Outpatient monitor recommended and requested. He  has not received monitor.   Presented today as a new patient. Lower extremity swelling about 3 weeks ago. No further syncope.  Uses motorized scooter in the store. He has RLE weakness. RUE with numbness. 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    Review of Systems: [y] = yes, [ ]  = no   General: Weight gain [ ] ; Weight loss [ ] ; Anorexia [ ] ; Fatigue [Y ]; Fever [ ] ; Chills [ ] ; Weakness [ ]   Cardiac: Chest pain/pressure [ ] ; Resting SOB [ ] ; Exertional SOB [ ] ; Orthopnea [ ] ; Pedal Edema [ Y]; Palpitations [ ] ; Syncope [Y ]; Presyncope [ ] ; Paroxysmal nocturnal dyspnea[ ]   Pulmonary: Cough [ ] ; Wheezing[ ] ; Hemoptysis[ ] ; Sputum [ ] ; Snoring [ ]   GI: Vomiting[ ] ; Dysphagia[ ] ; Melena[ ] ; Hematochezia [ ] ; Heartburn[ ] ;  Abdominal pain [ ] ; Constipation [ ] ; Diarrhea [ ] ; BRBPR [ ]   GU: Hematuria[ ] ; Dysuria [ ] ; Nocturia[ ]   Vascular: Pain in legs with walking [Y ]; Pain in feet with lying flat [ ] ; Non-healing sores [ ] ; Stroke [ ] ; TIA [ ] ; Slurred speech [ ] ;  Neuro: Headaches[ ] ; Vertigo[ ] ; Seizures[ ] ; Paresthesias[ ] ;Blurred vision [ ] ; Diplopia [ ] ; Vision changes [ ]   Ortho/Skin: Arthritis [ ] ; Joint pain [ Y]; Muscle pain [ ] ; Joint swelling [ ] ; Back Pain [ Y]; Rash [ ]   Psych: Depression[ ] ; Anxiety[ ]   Heme: Bleeding problems [ ] ; Clotting disorders [ ] ; Anemia [ ]   Endocrine: Diabetes [ Y]; Thyroid dysfunction[ ]    Past Medical History:  Diagnosis Date   Allergic rhinitis due to pollen    on allergy shots   Arrhythmia    1980s hospitalized for HTN and ?irregular rhythm but no specific dx but resolved during hospitalization   HTN (hypertension)    Hyperlipidemia     Current Outpatient Medications  Medication Sig Dispense Refill   ascorbic acid (VITAMIN C) 500 MG tablet Take 500 mg by mouth every morning.      atorvastatin (LIPITOR) 80 MG tablet Take 80 mg by mouth daily.     B Complex Vitamins (B COMPLEX 100 PO) Take 1 tablet by mouth every morning.      Cetirizine HCl 10 MG CAPS Take 1 capsule by mouth 2 (two) times a  day.      cloNIDine (CATAPRES) 0.2 MG tablet Take 0.2 mg by mouth 3 (three) times daily.     diltiazem (TIAZAC) 360 MG 24 hr capsule Take 1 capsule by mouth daily.     hydrochlorothiazide (HYDRODIURIL) 25 MG tablet Take 25 mg by mouth daily.     metFORMIN (GLUCOPHAGE) 1000 MG tablet Take by mouth 2 (two) times daily.     potassium chloride (K-DUR) 10 MEQ tablet Take 1 tablet by mouth daily.  1   predniSONE (DELTASONE) 20 MG tablet Take 60 mg by mouth daily.     tamsulosin (FLOMAX) 0.4 MG CAPS capsule Take 1 capsule (0.4 mg total) by mouth daily. 30 capsule 11   levofloxacin (LEVAQUIN) 750 MG tablet Take 1 tablet 1 hour prior to the procedure. (Patient not taking: Reported  on 05/21/2021) 1 tablet 0   No current facility-administered medications for this encounter.    No Known Allergies    Social History   Socioeconomic History   Marital status: Single    Spouse name: Not on file   Number of children: Not on file   Years of education: Not on file   Highest education level: Not on file  Occupational History   Occupation: school counselor  Tobacco Use   Smoking status: Never   Smokeless tobacco: Never  Vaping Use   Vaping Use: Never used  Substance and Sexual Activity   Alcohol use: No   Drug use: No   Sexual activity: Yes  Other Topics Concern   Not on file  Social History Narrative   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[place], [place]   Lives in [place] Determinants of Health   Financial Resource Strain: Not on file  Food Insecurity: Not on file  Transportation Needs: Not on file  Physical Activity: Not on file  Stress: Not on file  Social Connections: Not on file  Intimate Partner Violence: Not on file      Family History  Problem Relation Age of Onset   Cancer Mother    Cirrhosis Father        cirrhosis of the liver; alcohol related   Other Other        neice was on allergy vaccine when young   Colon cancer Neg Hx     Vitals:   05/21/21 1525  BP: 132/76  Pulse: 99  SpO2: (!) 75%  Weight: 114.4 kg (252 lb 3.2 oz)   Reds Clip 33%.   PHYSICAL EXAM: General:  Well appearing. No respiratory difficulty HEENT: normal Neck: supple. no JVD. Carotids 2+ bilat; no bruits. No lymphadenopathy or thryomegaly appreciated. Cor: PMI nondisplaced. Regular rate & rhythm. No rubs, gallops or murmurs. Lungs: clear Abdomen: soft, nontender, nondistended. No hepatosplenomegaly. No bruits or masses. Good bowel sounds. Extremities: no cyanosis, clubbing, rash, R and LLE 1+ edema. edema Neuro: alert & oriented x 3, cranial nerves grossly intact. moves all 4 extremities w/o difficulty. Affect pleasant.  ECG: SR 72 bpm ST depression 2,  3, AVF and V4 VF    ASSESSMENT & PLAN: Pre Op Clearance -Here for cardiac clearance for cervical surgery. Functional status limited due to cervical issues. -EKG concerning for ischemia. No chest pain. H/O hyperlipidemia, syncope, and DMII. Will set up for LHC/RHC to further assess hemodynamics/coronaries.   - Hold surgical clearance until after cath.   2.Syncope  -Had syncopal episode 05/14/21 . Unclear etiology.  -Evaluated in the ED -Carotid  Dopplers negative. Echo Ef 65-70%  -No further episode. Place Zio AT today.   3. Chronic HFpEF -05/15/21 Echo EF 65-70% RV normal Elevated LVEDP.  - Add 20 mg lasix daily. Add compression stockings.  - Continue current dose HCTZ  4. HTN  Stable today  - Continue current regimen.   5.  Hyperlipidemia  On Statin   5. DMII - On metformin - Start SGLT2i next visit.   Obtain precath labs.   Follow post cath 2-3 weeks with Dr Haroldine Laws. Delay surgical clearance until after heart cath.  The patient understands that risks included but are not limited to stroke (1 in 1000), death (1 in 24), kidney failure [usually temporary] (1 in 500), bleeding (1 in 200), allergic reaction [possibly serious] (1 in 200).  The patient understands and agrees to proceed.   Roshana Shuffield NP-C  5:35 PM

## 2021-05-23 ENCOUNTER — Ambulatory Visit (HOSPITAL_COMMUNITY)
Admission: RE | Admit: 2021-05-23 | Discharge: 2021-05-23 | Disposition: A | Discharge status: Home / Self Care | Payer: BC Managed Care – PPO | Source: Ambulatory Visit | Attending: Internal Medicine | Admitting: Internal Medicine

## 2021-05-23 ENCOUNTER — Encounter (HOSPITAL_COMMUNITY)
Admission: RE | Disposition: A | Discharge status: Home / Self Care | Payer: Self-pay | Source: Ambulatory Visit | Attending: Internal Medicine

## 2021-05-23 DIAGNOSIS — E119 Type 2 diabetes mellitus without complications: Secondary | ICD-10-CM | POA: Insufficient documentation

## 2021-05-23 DIAGNOSIS — R55 Syncope and collapse: Secondary | ICD-10-CM | POA: Insufficient documentation

## 2021-05-23 DIAGNOSIS — E785 Hyperlipidemia, unspecified: Secondary | ICD-10-CM | POA: Diagnosis not present

## 2021-05-23 DIAGNOSIS — I110 Hypertensive heart disease with heart failure: Secondary | ICD-10-CM | POA: Insufficient documentation

## 2021-05-23 DIAGNOSIS — I5032 Chronic diastolic (congestive) heart failure: Secondary | ICD-10-CM | POA: Insufficient documentation

## 2021-05-23 DIAGNOSIS — Z79899 Other long term (current) drug therapy: Secondary | ICD-10-CM | POA: Insufficient documentation

## 2021-05-23 DIAGNOSIS — Z7984 Long term (current) use of oral hypoglycemic drugs: Secondary | ICD-10-CM | POA: Insufficient documentation

## 2021-05-23 DIAGNOSIS — R9431 Abnormal electrocardiogram [ECG] [EKG]: Secondary | ICD-10-CM | POA: Diagnosis not present

## 2021-05-23 DIAGNOSIS — I2510 Atherosclerotic heart disease of native coronary artery without angina pectoris: Secondary | ICD-10-CM

## 2021-05-23 HISTORY — PX: RIGHT/LEFT HEART CATH AND CORONARY ANGIOGRAPHY: CATH118266

## 2021-05-23 LAB — POCT I-STAT EG7
Acid-Base Excess: 2 mmol/L (ref 0.0–2.0)
Acid-Base Excess: 3 mmol/L — ABNORMAL HIGH (ref 0.0–2.0)
Acid-base deficit: 1 mmol/L (ref 0.0–2.0)
Bicarbonate: 27.2 mmol/L (ref 20.0–28.0)
Bicarbonate: 28.9 mmol/L — ABNORMAL HIGH (ref 20.0–28.0)
Bicarbonate: 24.2 mmol/L (ref 20.0–28.0)
Calcium, Ion: 1.13 mmol/L — ABNORMAL LOW (ref 1.15–1.40)
Calcium, Ion: 1.24 mmol/L (ref 1.15–1.40)
Calcium, Ion: 0.84 mmol/L — CL (ref 1.15–1.40)
HCT: 31 % — ABNORMAL LOW (ref 39.0–52.0)
HCT: 31 % — ABNORMAL LOW (ref 39.0–52.0)
HCT: 26 % — ABNORMAL LOW (ref 39.0–52.0)
Hemoglobin: 10.5 g/dL — ABNORMAL LOW (ref 13.0–17.0)
Hemoglobin: 10.5 g/dL — ABNORMAL LOW (ref 13.0–17.0)
Hemoglobin: 8.8 g/dL — ABNORMAL LOW (ref 13.0–17.0)
O2 Saturation: 73 %
O2 Saturation: 74 %
O2 Saturation: 76 %
Potassium: 3.2 mmol/L — ABNORMAL LOW (ref 3.5–5.1)
Potassium: 3.4 mmol/L — ABNORMAL LOW (ref 3.5–5.1)
Potassium: 2.6 mmol/L — CL (ref 3.5–5.1)
Sodium: 138 mmol/L (ref 135–145)
Sodium: 139 mmol/L (ref 135–145)
Sodium: 144 mmol/L (ref 135–145)
TCO2: 29 mmol/L (ref 22–32)
TCO2: 30 mmol/L (ref 22–32)
TCO2: 25 mmol/L (ref 22–32)
pCO2, Ven: 46.6 mmHg (ref 44.0–60.0)
pCO2, Ven: 47.6 mmHg (ref 44.0–60.0)
pCO2, Ven: 42.7 mmHg — ABNORMAL LOW (ref 44.0–60.0)
pH, Ven: 7.374 (ref 7.250–7.430)
pH, Ven: 7.391 (ref 7.250–7.430)
pH, Ven: 7.362 (ref 7.250–7.430)
pO2, Ven: 39 mmHg (ref 32.0–45.0)
pO2, Ven: 40 mmHg (ref 32.0–45.0)
pO2, Ven: 42 mmHg (ref 32.0–45.0)

## 2021-05-23 LAB — POCT I-STAT 7, (LYTES, BLD GAS, ICA,H+H)
Acid-Base Excess: 3 mmol/L — ABNORMAL HIGH (ref 0.0–2.0)
Bicarbonate: 27.6 mmol/L (ref 20.0–28.0)
Calcium, Ion: 1.06 mmol/L — ABNORMAL LOW (ref 1.15–1.40)
HCT: 31 % — ABNORMAL LOW (ref 39.0–52.0)
Hemoglobin: 10.5 g/dL — ABNORMAL LOW (ref 13.0–17.0)
O2 Saturation: 100 %
Potassium: 3.2 mmol/L — ABNORMAL LOW (ref 3.5–5.1)
Sodium: 141 mmol/L (ref 135–145)
TCO2: 29 mmol/L (ref 22–32)
pCO2 arterial: 40.9 mmHg (ref 32.0–48.0)
pH, Arterial: 7.436 (ref 7.350–7.450)
pO2, Arterial: 174 mmHg — ABNORMAL HIGH (ref 83.0–108.0)

## 2021-05-23 SURGERY — RIGHT/LEFT HEART CATH AND CORONARY ANGIOGRAPHY
Anesthesia: LOCAL

## 2021-05-23 MED ORDER — HEPARIN (PORCINE) IN NACL 1000-0.9 UT/500ML-% IV SOLN
INTRAVENOUS | Status: AC
  Filled 2021-05-23: qty 1000

## 2021-05-23 MED ORDER — LIDOCAINE HCL (PF) 1 % IJ SOLN
INTRAMUSCULAR | Status: DC | PRN
  Administered 2021-05-23 (×2): 2 mL via INTRADERMAL

## 2021-05-23 MED ORDER — HEPARIN SODIUM (PORCINE) 1000 UNIT/ML IJ SOLN
INTRAMUSCULAR | Status: DC | PRN
  Administered 2021-05-23: 6000 [IU] via INTRAVENOUS

## 2021-05-23 MED ORDER — SODIUM CHLORIDE 0.9 % IV SOLN: 250.0000 mL | INTRAVENOUS | Status: DC | PRN

## 2021-05-23 MED ORDER — NITROGLYCERIN IN D5W 200-5 MCG/ML-% IV SOLN
INTRAVENOUS | Status: AC
  Filled 2021-05-23: qty 250

## 2021-05-23 MED ORDER — ASPIRIN 81 MG PO CHEW
CHEWABLE_TABLET | ORAL | Status: AC
  Filled 2021-05-23: qty 4

## 2021-05-23 MED ORDER — LABETALOL HCL 5 MG/ML IV SOLN
10.0000 mg | INTRAVENOUS | Status: DC | PRN
  Administered 2021-05-23: 5 mg via INTRAVENOUS
  Filled 2021-05-23: qty 4

## 2021-05-23 MED ORDER — SODIUM CHLORIDE 0.9% FLUSH: 3.0000 mL | Freq: Two times a day (BID) | INTRAVENOUS | Status: DC

## 2021-05-23 MED ORDER — LIDOCAINE HCL (PF) 1 % IJ SOLN
INTRAMUSCULAR | Status: AC
  Filled 2021-05-23: qty 30

## 2021-05-23 MED ORDER — VERAPAMIL HCL 2.5 MG/ML IV SOLN
INTRAVENOUS | Status: AC
  Filled 2021-05-23: qty 2

## 2021-05-23 MED ORDER — FENTANYL CITRATE (PF) 100 MCG/2ML IJ SOLN
INTRAMUSCULAR | Status: AC
  Filled 2021-05-23: qty 2

## 2021-05-23 MED ORDER — VERAPAMIL HCL 2.5 MG/ML IV SOLN
INTRAVENOUS | Status: DC | PRN
  Administered 2021-05-23: 10 mL via INTRA_ARTERIAL

## 2021-05-23 MED ORDER — HEPARIN (PORCINE) IN NACL 1000-0.9 UT/500ML-% IV SOLN
INTRAVENOUS | Status: DC | PRN
  Administered 2021-05-23 (×2): 500 mL

## 2021-05-23 MED ORDER — SODIUM CHLORIDE 0.9% FLUSH: 3.0000 mL | INTRAVENOUS | Status: DC | PRN

## 2021-05-23 MED ORDER — HEPARIN SODIUM (PORCINE) 1000 UNIT/ML IJ SOLN
INTRAMUSCULAR | Status: AC
  Filled 2021-05-23: qty 1

## 2021-05-23 MED ORDER — HYDRALAZINE HCL 20 MG/ML IJ SOLN: 10.0000 mg | INTRAMUSCULAR | Status: DC | PRN

## 2021-05-23 MED ORDER — SODIUM CHLORIDE 0.9 % IV SOLN: INTRAVENOUS | Status: DC

## 2021-05-23 MED ORDER — ASPIRIN 81 MG PO CHEW
81.0000 mg | CHEWABLE_TABLET | ORAL | Status: AC
  Administered 2021-05-23: 81 mg via ORAL
  Filled 2021-05-23: qty 1

## 2021-05-23 MED ORDER — ONDANSETRON HCL 4 MG/2ML IJ SOLN: 4.0000 mg | Freq: Four times a day (QID) | INTRAMUSCULAR | Status: DC | PRN

## 2021-05-23 MED ORDER — MIDAZOLAM HCL 2 MG/2ML IJ SOLN
INTRAMUSCULAR | Status: DC | PRN
  Administered 2021-05-23: 1 mg via INTRAVENOUS

## 2021-05-23 MED ORDER — ACETAMINOPHEN 325 MG PO TABS: 650.0000 mg | ORAL_TABLET | ORAL | Status: DC | PRN

## 2021-05-23 MED ORDER — MIDAZOLAM HCL 2 MG/2ML IJ SOLN
INTRAMUSCULAR | Status: AC
  Filled 2021-05-23: qty 2

## 2021-05-23 MED ORDER — FENTANYL CITRATE (PF) 100 MCG/2ML IJ SOLN
INTRAMUSCULAR | Status: DC | PRN
  Administered 2021-05-23: 25 ug via INTRAVENOUS

## 2021-05-23 MED ORDER — IOHEXOL 350 MG/ML SOLN
INTRAVENOUS | Status: DC | PRN
  Administered 2021-05-23: 75 mL

## 2021-05-23 SURGICAL SUPPLY — 12 items
CATH 5FR JL3.5 JR4 ANG PIG MP (CATHETERS) ×1 IMPLANT
CATH BALLN WEDGE 5F 110CM (CATHETERS) ×1 IMPLANT
CATH INFINITI 5 FR 3DRC (CATHETERS) ×1 IMPLANT
DEVICE RAD COMP TR BAND LRG (VASCULAR PRODUCTS) ×1 IMPLANT
GLIDESHEATH SLEND SS 6F .021 (SHEATH) ×1 IMPLANT
GUIDEWIRE .025 260CM (WIRE) ×1 IMPLANT
GUIDEWIRE INQWIRE 1.5J.035X260 (WIRE) IMPLANT
INQWIRE 1.5J .035X260CM (WIRE) ×2
PACK CARDIAC CATHETERIZATION (CUSTOM PROCEDURE TRAY) ×2 IMPLANT
SHEATH GLIDE SLENDER 4/5FR (SHEATH) ×1 IMPLANT
TRANSDUCER W/STOPCOCK (MISCELLANEOUS) ×2 IMPLANT
WIRE HI TORQ VERSACORE-J 145CM (WIRE) ×1 IMPLANT

## 2021-05-23 NOTE — Interval H&P Note (Signed)
History and Physical Interval Note:  05/23/2021 8:10 AM  [name]  has presented today for surgery, with the diagnosis of chest pain.  The various methods of treatment have been discussed with the patient and family. After consideration of risks, benefits and other options for treatment, the patient has consented to  Procedure(s): RIGHT/LEFT HEART CATH AND CORONARY ANGIOGRAPHY (N/A) as a surgical intervention.  The patient's history has been reviewed, patient examined, no change in status, stable for surgery.  I have reviewed the patient's chart and labs.  Questions were answered to the patient's satisfaction.     [name]

## 2021-05-26 ENCOUNTER — Encounter (HOSPITAL_COMMUNITY): Payer: Self-pay | Admitting: Internal Medicine

## 2021-05-26 ENCOUNTER — Other Ambulatory Visit: Payer: Self-pay | Admitting: Neurosurgery

## 2021-05-27 ENCOUNTER — Ambulatory Visit (HOSPITAL_COMMUNITY): Payer: BC Managed Care – PPO | Attending: Neurology | Admitting: Physical Therapy

## 2021-05-27 ENCOUNTER — Other Ambulatory Visit: Payer: Self-pay

## 2021-05-27 DIAGNOSIS — M6281 Muscle weakness (generalized): Secondary | ICD-10-CM | POA: Insufficient documentation

## 2021-05-27 NOTE — Therapy (Signed)
[facility] [place], [place], [address] Phone: [phone]   Fax:  [phone]  Patient Details  Name: Andrew Arnold MRN: [id number] Date of Birth: 07/10/1956 Referring Provider:  [name], MD  Encounter Date: 05/27/2021  Pt arrived for session today following  2 weeks missed due to medical issues.  Pt reports he sent to ED and was admitted overnight due to chest pain resulting in a heart cath completed on 05/23/21.  Pt states he is following up with cardiologist regarding this.  Pt also reports he is having surgery next week on 11/23 for ACDF C3-4.  Discussed with evaluating therapist and patient and determined he would need to be discharged at this time until cleared by cardiologist and completes surgery next week.    No charge for treatment this session as pt would like his copay credited to his account for today.  Teena Irani, PTA/CLT, Lissa Morales [phone]   Teena Irani 05/27/2021, 5:01 PM  [facility] [address] [place], [place], [address] Phone: [phone]   Fax:  [phone]

## 2021-05-28 ENCOUNTER — Other Ambulatory Visit: Payer: Self-pay | Admitting: Neurosurgery

## 2021-05-29 ENCOUNTER — Encounter (HOSPITAL_COMMUNITY): Payer: BC Managed Care – PPO | Admitting: Physical Therapy

## 2021-05-30 NOTE — Progress Notes (Signed)
Surgical Instructions    Your procedure is scheduled on 06/04/21.  Report to [facility] Main Entrance "A" at 10:30 A.M., then check in with the Admitting office.  Call this number if you have problems the morning of surgery:  [phone]   If you have any questions prior to your surgery date call [phone]: Open Monday-Friday 8am-4pm    Remember:  Do not eat or drink after midnight the night before your surgery     Take these medicines the morning of surgery with A SIP OF WATER  cetirizine (ZYRTEC)  cloNIDine (CATAPRES)  diltiazem (CARDIZEM CD)   As of today, STOP taking any Aspirin (unless otherwise instructed by your surgeon) Aleve, Naproxen, Ibuprofen, Motrin, Advil, Goody's, BC's, all herbal medications, fish oil, and all vitamins.  WHAT DO I DO ABOUT MY DIABETES MEDICATION?   Do not take oral diabetes medicines (pills) the morning of surgery.  THE MORNING OF SURGERY, do not take metFORMIN (GLUCOPHAGE).  The day of surgery, do not take other diabetes injectables, including Byetta (exenatide), Bydureon (exenatide ER), Victoza (liraglutide), or Trulicity (dulaglutide).  If your CBG is greater than 220 mg/dL, you may take  of your sliding scale (correction) dose of insulin.   HOW TO MANAGE YOUR DIABETES BEFORE AND AFTER SURGERY  Why is it important to control my blood sugar before and after surgery? Improving blood sugar levels before and after surgery helps healing and can limit problems. A way of improving blood sugar control is eating a healthy diet by:  Eating less sugar and carbohydrates  Increasing activity/exercise  Talking with your doctor about reaching your blood sugar goals High blood sugars (greater than 180 mg/dL) can raise your risk of infections and slow your recovery, so you will need to focus on controlling your diabetes during the weeks before surgery. Make sure that the doctor who takes care of your diabetes knows about your planned surgery  including the date and location.  How do I manage my blood sugar before surgery? Check your blood sugar at least 4 times a day, starting 2 days before surgery, to make sure that the level is not too high or low.  Check your blood sugar the morning of your surgery when you wake up and every 2 hours until you get to the Short Stay unit.  If your blood sugar is less than 70 mg/dL, you will need to treat for low blood sugar: Do not take insulin. Treat a low blood sugar (less than 70 mg/dL) with  cup of clear juice (cranberry or apple), 4 glucose tablets, OR glucose gel. Recheck blood sugar in 15 minutes after treatment (to make sure it is greater than 70 mg/dL). If your blood sugar is not greater than 70 mg/dL on recheck, call [phone] for further instructions. Report your blood sugar to the short stay nurse when you get to Short Stay.  If you are admitted to the hospital after surgery: Your blood sugar will be checked by the staff and you will probably be given insulin after surgery (instead of oral diabetes medicines) to make sure you have good blood sugar levels. The goal for blood sugar control after surgery is 80-180 mg/dL.    After your COVID test   You are not required to quarantine however you are required to wear a well-fitting mask when you are out and around people not in your household.  If your mask becomes wet or soiled, replace with a new one.  Wash your hands often with  soap and water for 20 seconds or clean your hands with an alcohol-based hand sanitizer that contains at least 60% alcohol.  Do not share personal items.  Notify your provider: if you are in close contact with someone who has COVID  or if you develop a fever of 100.4 or greater, sneezing, cough, sore throat, shortness of breath or body aches.             Do not wear jewelry or makeup Do not wear lotions, powders, perfumes/colognes, or deodorant. Do not shave 48 hours prior to surgery.  Men may shave  face and neck. Do not bring valuables to the hospital. DO Not wear nail polish, gel polish, artificial nails, or any other type of covering on natural nails including finger and toenails. If patients have artificial nails, gel coating, etc. that need to be removed by a nail salon, please have this removed prior to surgery or surgery may need to be canceled/delayed if the surgeon/ anesthesia feels like the patient is unable to be adequately monitored.             [facility] is not responsible for any belongings or valuables.  Do NOT Smoke (Tobacco/Vaping)  24 hours prior to your procedure  If you use a CPAP at night, you may bring your mask for your overnight stay.   Contacts, glasses, hearing aids, dentures or partials may not be worn into surgery, please bring cases for these belongings   For patients admitted to the hospital, discharge time will be determined by your treatment team.   Patients discharged the day of surgery will not be allowed to drive home, and someone needs to stay with them for 24 hours.  NO VISITORS WILL BE ALLOWED IN PRE-OP WHERE PATIENTS ARE PREPPED FOR SURGERY.  ONLY 1 SUPPORT PERSON MAY BE PRESENT IN THE WAITING ROOM WHILE YOU ARE IN SURGERY.  IF YOU ARE TO BE ADMITTED, ONCE YOU ARE IN YOUR ROOM YOU WILL BE ALLOWED TWO (2) VISITORS. 1 (ONE) VISITOR MAY STAY OVERNIGHT BUT MUST ARRIVE TO THE ROOM BY 8pm.  Minor children may have two parents present. Special consideration for safety and communication needs will be reviewed on a case by case basis.  Special instructions:    Oral Hygiene is also important to reduce your risk of infection.  Remember - BRUSH YOUR TEETH THE MORNING OF SURGERY WITH YOUR REGULAR TOOTHPASTE   [facility]- Preparing For Surgery  Before surgery, you can play an important role. Because skin is not sterile, your skin needs to be as free of germs as possible. You can reduce the number of germs on your skin by washing with CHG (chlorahexidine  gluconate) Soap before surgery.  CHG is an antiseptic cleaner which kills germs and bonds with the skin to continue killing germs even after washing.     Please do not use if you have an allergy to CHG or antibacterial soaps. If your skin becomes reddened/irritated stop using the CHG.  Do not shave (including legs and underarms) for at least 48 hours prior to first CHG shower. It is OK to shave your face.  Please follow these instructions carefully.     Shower the NIGHT BEFORE SURGERY and the MORNING OF SURGERY with CHG Soap.   If you chose to wash your hair, wash your hair first as usual with your normal shampoo. After you shampoo, rinse your hair and body thoroughly to remove the shampoo.  Then [employer] and genitals (private  parts) with your normal soap and rinse thoroughly to remove soap.  After that Use CHG Soap as you would any other liquid soap. You can apply CHG directly to the skin and wash gently with a scrungie or a clean washcloth.   Apply the CHG Soap to your body ONLY FROM THE NECK DOWN.  Do not use on open wounds or open sores. Avoid contact with your eyes, ears, mouth and genitals (private parts). Wash Face and genitals (private parts)  with your normal soap.   Wash thoroughly, paying special attention to the area where your surgery will be performed.  Thoroughly rinse your body with warm water from the neck down.  DO NOT shower/wash with your normal soap after using and rinsing off the CHG Soap.  Pat yourself dry with a CLEAN TOWEL.  Wear CLEAN PAJAMAS to bed the night before surgery  Place CLEAN SHEETS on your bed the night before your surgery  DO NOT SLEEP WITH PETS.   Day of Surgery: Take a shower with CHG soap. Wear Clean/Comfortable clothing the morning of surgery Do not apply any deodorants/lotions.   Remember to brush your teeth WITH YOUR REGULAR TOOTHPASTE.   Please read over the following fact sheets that you were given.

## 2021-06-02 ENCOUNTER — Other Ambulatory Visit: Payer: Self-pay

## 2021-06-02 ENCOUNTER — Encounter (HOSPITAL_COMMUNITY): Payer: Self-pay

## 2021-06-02 ENCOUNTER — Encounter (HOSPITAL_COMMUNITY)
Admission: RE | Admit: 2021-06-02 | Discharge: 2021-06-02 | Disposition: A | Discharge status: Home / Self Care | Payer: BC Managed Care – PPO | Source: Ambulatory Visit | Attending: Neurosurgery | Admitting: Neurosurgery

## 2021-06-02 VITALS — BP 141/90 | HR 80 | Temp 98.2°F | Resp 17 | Ht 69.0 in | Wt 250.7 lb

## 2021-06-02 DIAGNOSIS — E119 Type 2 diabetes mellitus without complications: Secondary | ICD-10-CM | POA: Insufficient documentation

## 2021-06-02 DIAGNOSIS — I10 Essential (primary) hypertension: Secondary | ICD-10-CM | POA: Diagnosis not present

## 2021-06-02 DIAGNOSIS — Z01812 Encounter for preprocedural laboratory examination: Secondary | ICD-10-CM | POA: Diagnosis not present

## 2021-06-02 DIAGNOSIS — Z20822 Contact with and (suspected) exposure to covid-19: Secondary | ICD-10-CM | POA: Insufficient documentation

## 2021-06-02 DIAGNOSIS — M4712 Other spondylosis with myelopathy, cervical region: Secondary | ICD-10-CM | POA: Diagnosis not present

## 2021-06-02 DIAGNOSIS — Z01818 Encounter for other preprocedural examination: Secondary | ICD-10-CM

## 2021-06-02 HISTORY — DX: Type 2 diabetes mellitus without complications: E11.9

## 2021-06-02 LAB — BASIC METABOLIC PANEL
Anion gap: 11 (ref 5–15)
BUN: 26 mg/dL — ABNORMAL HIGH (ref 8–23)
CO2: 26 mmol/L (ref 22–32)
Calcium: 8.9 mg/dL (ref 8.9–10.3)
Chloride: 99 mmol/L (ref 98–111)
Creatinine, Ser: 1.12 mg/dL (ref 0.61–1.24)
GFR, Estimated: >60 mL/min (ref >60)
Glucose, Bld: 108 mg/dL — ABNORMAL HIGH (ref 70–99)
Potassium: 3.5 mmol/L (ref 3.5–5.1)
Sodium: 136 mmol/L (ref 135–145)

## 2021-06-02 LAB — TYPE AND SCREEN
ABO/RH(D): A POS
Antibody Screen: NEGATIVE

## 2021-06-02 LAB — HEMOGLOBIN A1C
Hgb A1c MFr Bld: 6.7 % — ABNORMAL HIGH (ref 4.8–5.6)
Mean Plasma Glucose: 145.59 mg/dL

## 2021-06-02 LAB — CBC
HCT: 37.5 % — ABNORMAL LOW (ref 39.0–52.0)
Hemoglobin: 12.6 g/dL — ABNORMAL LOW (ref 13.0–17.0)
MCH: 33.3 pg (ref 26.0–34.0)
MCHC: 33.6 g/dL (ref 30.0–36.0)
MCV: 99.2 fL (ref 80.0–100.0)
Platelets: 201 10*3/uL (ref 150–400)
RBC: 3.78 MIL/uL — ABNORMAL LOW (ref 4.22–5.81)
RDW: 16.1 % — ABNORMAL HIGH (ref 11.5–15.5)
WBC: 5.9 10*3/uL (ref 4.0–10.5)
nRBC: 0 % (ref 0.0–0.2)

## 2021-06-02 LAB — SURGICAL PCR SCREEN
MRSA, PCR: NEGATIVE
Staphylococcus aureus: NEGATIVE

## 2021-06-02 LAB — GLUCOSE, CAPILLARY: Glucose-Capillary: 121 mg/dL — ABNORMAL HIGH (ref 70–99)

## 2021-06-02 NOTE — Progress Notes (Addendum)
Surgical Instructions    Your procedure is scheduled on 06/03/21.  Report to [facility] Main Entrance "A" at 08:00 A.M., then check in with the Admitting office.  Call this number if you have problems the morning of surgery:  [phone]   If you have any questions prior to your surgery date call [phone]: Open Monday-Friday 8am-4pm    Remember:  Do not eat or drink after midnight the night before your surgery     Take these medicines the morning of surgery with A SIP OF WATER  cetirizine (ZYRTEC)  cloNIDine (CATAPRES)  diltiazem (CARDIZEM CD)   As of today, STOP taking any Aspirin (unless otherwise instructed by your surgeon) Aleve, Naproxen, Ibuprofen, Motrin, Advil, Goody's, BC's, all herbal medications, fish oil, and all vitamins.  WHAT DO I DO ABOUT MY DIABETES MEDICATION?   Do not take oral diabetes medicines (pills) the morning of surgery.  THE MORNING OF SURGERY, do not take metFORMIN (GLUCOPHAGE).  The day of surgery, do not take other diabetes injectables, including Byetta (exenatide), Bydureon (exenatide ER), Victoza (liraglutide), or Trulicity (dulaglutide).  HOW TO MANAGE YOUR DIABETES BEFORE AND AFTER SURGERY  Why is it important to control my blood sugar before and after surgery? Improving blood sugar levels before and after surgery helps healing and can limit problems. A way of improving blood sugar control is eating a healthy diet by:  Eating less sugar and carbohydrates  Increasing activity/exercise  Talking with your doctor about reaching your blood sugar goals High blood sugars (greater than 180 mg/dL) can raise your risk of infections and slow your recovery, so you will need to focus on controlling your diabetes during the weeks before surgery. Make sure that the doctor who takes care of your diabetes knows about your planned surgery including the date and location.  How do I manage my blood sugar before surgery? Check your blood sugar at least 4  times a day, starting 2 days before surgery, to make sure that the level is not too high or low.  Check your blood sugar the morning of your surgery when you wake up and every 2 hours until you get to the Short Stay unit.  If your blood sugar is less than 70 mg/dL, you will need to treat for low blood sugar: Do not take insulin. Treat a low blood sugar (less than 70 mg/dL) with  cup of clear juice (cranberry or apple), 4 glucose tablets, OR glucose gel. Recheck blood sugar in 15 minutes after treatment (to make sure it is greater than 70 mg/dL). If your blood sugar is not greater than 70 mg/dL on recheck, call [phone] for further instructions. Report your blood sugar to the short stay nurse when you get to Short Stay.  If you are admitted to the hospital after surgery: Your blood sugar will be checked by the staff and you will probably be given insulin after surgery (instead of oral diabetes medicines) to make sure you have good blood sugar levels. The goal for blood sugar control after surgery is 80-180 mg/dL.    After your COVID test   You are not required to quarantine however you are required to wear a well-fitting mask when you are out and around people not in your household.  If your mask becomes wet or soiled, replace with a new one.  Wash your hands often with soap and water for 20 seconds or clean your hands with an alcohol-based hand sanitizer that contains at least 60% alcohol.  Do not share personal items.  Notify your provider: if you are in close contact with someone who has COVID  or if you develop a fever of 100.4 or greater, sneezing, cough, sore throat, shortness of breath or body aches.             Do not wear jewelry or makeup Do not wear lotions, powders, perfumes/colognes, or deodorant. Do not shave 48 hours prior to surgery.  Men may shave face and neck. Do not bring valuables to the hospital. DO Not wear nail polish, gel polish, artificial nails, or  any other type of covering on natural nails including finger and toenails. If patients have artificial nails, gel coating, etc. that need to be removed by a nail salon, please have this removed prior to surgery or surgery may need to be canceled/delayed if the surgeon/ anesthesia feels like the patient is unable to be adequately monitored.             [facility] is not responsible for any belongings or valuables.  Do NOT Smoke (Tobacco/Vaping)  24 hours prior to your procedure  If you use a CPAP at night, you may bring your mask for your overnight stay.   Contacts, glasses, hearing aids, dentures or partials may not be worn into surgery, please bring cases for these belongings   For patients admitted to the hospital, discharge time will be determined by your treatment team.   Patients discharged the day of surgery will not be allowed to drive home, and someone needs to stay with them for 24 hours.  NO VISITORS WILL BE ALLOWED IN PRE-OP WHERE PATIENTS ARE PREPPED FOR SURGERY.  ONLY 1 SUPPORT PERSON MAY BE PRESENT IN THE WAITING ROOM WHILE YOU ARE IN SURGERY.  IF YOU ARE TO BE ADMITTED, ONCE YOU ARE IN YOUR ROOM YOU WILL BE ALLOWED TWO (2) VISITORS. 1 (ONE) VISITOR MAY STAY OVERNIGHT BUT MUST ARRIVE TO THE ROOM BY 8pm.  Minor children may have two parents present. Special consideration for safety and communication needs will be reviewed on a case by case basis.  Special instructions:    Oral Hygiene is also important to reduce your risk of infection.  Remember - BRUSH YOUR TEETH THE MORNING OF SURGERY WITH YOUR REGULAR TOOTHPASTE   [place]- Preparing For Surgery  Before surgery, you can play an important role. Because skin is not sterile, your skin needs to be as free of germs as possible. You can reduce the number of germs on your skin by washing with CHG (chlorahexidine gluconate) Soap before surgery.  CHG is an antiseptic cleaner which kills germs and bonds with the skin to continue  killing germs even after washing.     Please do not use if you have an allergy to CHG or antibacterial soaps. If your skin becomes reddened/irritated stop using the CHG.  Do not shave (including legs and underarms) for at least 48 hours prior to first CHG shower. It is OK to shave your face.  Please follow these instructions carefully.     Shower the NIGHT BEFORE SURGERY and the MORNING OF SURGERY with CHG Soap.   If you chose to wash your hair, wash your hair first as usual with your normal shampoo. After you shampoo, rinse your hair and body thoroughly to remove the shampoo.  Then [employer] and genitals (private parts) with your normal soap and rinse thoroughly to remove soap.  After that Use CHG Soap as you would any other  liquid soap. You can apply CHG directly to the skin and wash gently with a scrungie or a clean washcloth.   Apply the CHG Soap to your body ONLY FROM THE NECK DOWN.  Do not use on open wounds or open sores. Avoid contact with your eyes, ears, mouth and genitals (private parts). Wash Face and genitals (private parts)  with your normal soap.   Wash thoroughly, paying special attention to the area where your surgery will be performed.  Thoroughly rinse your body with warm water from the neck down.  DO NOT shower/wash with your normal soap after using and rinsing off the CHG Soap.  Pat yourself dry with a CLEAN TOWEL.  Wear CLEAN PAJAMAS to bed the night before surgery  Place CLEAN SHEETS on your bed the night before your surgery  DO NOT SLEEP WITH PETS.   Day of Surgery: Take a shower with CHG soap. Wear Clean/Comfortable clothing the morning of surgery Do not apply any deodorants/lotions.   Remember to brush your teeth WITH YOUR REGULAR TOOTHPASTE.   Please read over the following fact sheets that you were given.

## 2021-06-02 NOTE — Progress Notes (Signed)
PCP - Dr. Newt Minion Cardiologist - Dr. Glori Bickers  PPM/ICD - n/a Device Orders - n/a Rep Notified - n/a  Chest x-ray - n/a EKG - 05/21/21 Stress Test - denies ECHO - 05/15/21 Cardiac Cath - 05/23/21  Sleep Study - denies CPAP - n/a  CBG today- 121 Checks Blood Sugar sometimes once a week. Patient states the last time he checked his blood sugar it was in the 130's.   Blood Thinner Instructions: n/a Aspirin Instructions: n/a  ERAS Protcol - No PRE-SURGERY Ensure or G2- n/a  COVID TEST- 06/02/21. Pending.    Anesthesia review: Yes. Messaged Lorretta Harp, PA and made him aware patient is scheduled for surgery tomorrow.   Patient denies shortness of breath, fever, cough and chest pain at PAT appointment   All instructions explained to the patient, with a verbal understanding of the material. Patient agrees to go over the instructions while at home for a better understanding. Patient also instructed to self quarantine after being tested for COVID-19. The opportunity to ask questions was provided.

## 2021-06-02 NOTE — Progress Notes (Signed)
Anesthesia Chart Review:  Recently evaluated by cardiology for episode of syncope.  Per note by Darrick Grinder, NP 05/21/2021, "On 05/15/2021 presented ED with syncope. Standing in front of the refrigerator then woke up on the floor. Also complained of leg edema for 2 weeks. HIV NR.  EKG SR 64 bpm. ECHO EF 65-70%, LVH, grade II DD, and normal RV. Carotids  and lower extremity dopplers normal. Discharged the same day.  Outpatient monitor recommended and requested. He has not received monitor...-Here for cardiac clearance for cervical surgery. Functional status limited due to cervical issues. -EKG concerning for ischemia. No chest pain. H/O hyperlipidemia, syncope, and DMII. Will set up for LHC/RHC to further assess hemodynamics/coronaries. - Hold surgical clearance until after cath."  Dr. Haroldine Laws subsequently performed cath on 05/23/2021 and commented on result stating, "Will need aggressive risk factor management. He can proceed with surgery without further cardiac work-up. Felt to be low-risk for peri-op CV complications."  Severe cervical spine stenosis on MRI 04/28/21.  Preop labs are currently still pending.   EKG 05/21/21:  Sinus rhythm. Rate 72.  Cervical MRI 04/28/21: IMPRESSION: 1. At C3-C4, severe canal stenosis with severe right greater than left foraminal stenosis. T2 hyperintensity within the cord at this level could represent edema and/or myelomalacia. 2. Moderate to severe foraminal stenosis on the right at C5-C6 and C6-C7 and bilaterally at C7-T1. Moderate foraminal stenosis bilaterally at C4-C5 and on the left at C5-C6 and C6-C7. 3. Mild-to-moderate canal stenosis at C2-C3. Mild canal stenosis C4-C5 and C5-C6 where disc contacts and flattens the ventral cord. 4. Poor visualization of the left vertebral artery flow void. This could relate to a small/non dominant left vertebral artery; however, superimposed stenosis or occlusion is difficult to exclude. A CTA neck could further  evaluate.  Right/left heart cath 05/23/2021: Assessment: 1. Mild to moderate non-obstructive CAD 2. EF 65-70% 3. Normal filling pressures 4. High cardiac output with no evidence of intracardiac shunting  Will need aggressive risk factor management. He can proceed with surgery without further cardiac work-up. Felt to be low-risk for peri-op CV complications.  TTE 05/15/21:  1. Left ventricular ejection fraction, by estimation, is 65 to 70%. The  left ventricle has normal function. The left ventricle has no regional  wall motion abnormalities. There is severe asymmetric left ventricular  hypertrophy of the septal segment. Left   ventricular diastolic parameters are consistent with Grade II diastolic  dysfunction (pseudonormalization). Elevated left ventricular end-diastolic  pressure.   2. Right ventricular systolic function is normal. The right ventricular  size is normal. There is normal pulmonary artery systolic pressure. The  estimated right ventricular systolic pressure is 00.8 mmHg.   3. There is a trivial pericardial effusion posterior to the left  ventricle.   4. The mitral valve is grossly normal. Mild mitral valve regurgitation.   5. The aortic valve is tricuspid. Aortic valve regurgitation is not  visualized. No aortic stenosis is present. Aortic valve mean gradient  measures 6.0 mmHg.   6. The inferior vena cava is normal in size with greater than 50%  respiratory variability, suggesting right atrial pressure of 3 mmHg.   Carotid ultrasound 05/15/21: IMPRESSION: 1. Right carotid artery system: Patent without significant atherosclerotic plaque formation.   2. Left carotid artery system: Patent without significant atherosclerotic plaque formation.   3. Vertebral artery system: The left vertebral artery is not visualized. The right vertebral artery is patent with antegrade flow.   Karoline Caldwell, PA-C [facility] Short Stay Center/Anesthesiology Phone [phone])  [phone] 06/02/2021  4:12 PM

## 2021-06-02 NOTE — Anesthesia Preprocedure Evaluation (Addendum)
Anesthesia Evaluation  Patient identified by MRN, date of birth, ID band Patient awake    Reviewed: Allergy & Precautions, H&P , NPO status , Patient's Chart, lab work & pertinent test results  Airway Mallampati: III  TM Distance: >3 FB Neck ROM: Full    Dental no notable dental hx.    Pulmonary neg pulmonary ROS,    Pulmonary exam normal breath sounds clear to auscultation       Cardiovascular hypertension, Pt. on medications negative cardio ROS Normal cardiovascular exam Rhythm:Regular Rate:Normal     Neuro/Psych negative neurological ROS  negative psych ROS   GI/Hepatic Neg liver ROS, GERD  ,  Endo/Other  negative endocrine ROSdiabetes, Type 2  Renal/GU negative Renal ROS  negative genitourinary   Musculoskeletal  (+) Arthritis , Osteoarthritis,    Abdominal (+) + obese,   Peds negative pediatric ROS (+)  Hematology negative hematology ROS (+)   Anesthesia Other Findings   Reproductive/Obstetrics negative OB ROS                           Anesthesia Physical Anesthesia Plan  ASA: 3  Anesthesia Plan: General   Post-op Pain Management:    Induction: Intravenous  PONV Risk Score and Plan: 2 and Ondansetron, Midazolam and Treatment may vary due to age or medical condition  Airway Management Planned: Oral ETT and Video Laryngoscope Planned  Additional Equipment:   Intra-op Plan:   Post-operative Plan: Extubation in OR  Informed Consent: I have reviewed the patients History and Physical, chart, labs and discussed the procedure including the risks, benefits and alternatives for the proposed anesthesia with the patient or authorized representative who has indicated his/her understanding and acceptance.     Dental advisory given  Plan Discussed with: CRNA  Anesthesia Plan Comments: (PAT note by Karoline Caldwell, PA-C: Recently evaluated by cardiology for episode of syncope.  Per  note by Darrick Grinder, NP 05/21/2021, "On 05/15/2021 presented ED with syncope.Standing in front of the refrigerator then woke up on the floor. Also complained of leg edema for 2 weeks.HIV NR. EKG SR 64 bpm. ECHO EF 65-70%, LVH, grade II DD, and normal RV. Carotids and lower extremity dopplers normal. Discharged the same day.Outpatient monitor recommended and requested. He has not received monitor...-Here for cardiac clearance for cervical surgery. Functional status limited due to cervical issues. -EKG concerning for ischemia. No chest pain. H/O hyperlipidemia, syncope, and DMII. Will set up for LHC/RHC to further assess hemodynamics/coronaries. - Hold surgical clearance until after cath."  Dr. Haroldine Laws subsequently performed cath on 05/23/2021 and commented on result stating, "Will need aggressive risk factor management. He can proceed with surgery without further cardiac work-up. Felt to be low-risk for peri-op CV complications."  Severe cervical spine stenosis on MRI 04/28/21.  Preop labs are currently still pending.   EKG 05/21/21:  Sinus rhythm. Rate 72.  Cervical MRI 04/28/21: IMPRESSION: 1. At C3-C4, severe canal stenosis with severe right greater than left foraminal stenosis. T2 hyperintensity within the cord at this level could represent edema and/or myelomalacia. 2. Moderate to severe foraminal stenosis on the right at C5-C6 and C6-C7 and bilaterally at C7-T1. Moderate foraminal stenosis bilaterally at C4-C5 and on the left at C5-C6 and C6-C7. 3. Mild-to-moderate canal stenosis at C2-C3. Mild canal stenosis C4-C5 and C5-C6 where disc contacts and flattens the ventral cord. 4. Poor visualization of the left vertebral artery flow void. This could relate to a small/non dominant left vertebral artery;  however, superimposed stenosis or occlusion is difficult to exclude. A CTA neck could further evaluate.  Right/left heart cath 05/23/2021: Assessment: 1. Mild to moderate non-obstructive  CAD 2. EF 65-70% 3. Normal filling pressures 4. High cardiac output with no evidence of intracardiac shunting  Will need aggressive risk factor management. He can proceed with surgery without further cardiac work-up. Felt to be low-risk for peri-op CV complications.  TTE 05/15/21: 1. Left ventricular ejection fraction, by estimation, is 65 to 70%. The  left ventricle has normal function. The left ventricle has no regional  wall motion abnormalities. There is severe asymmetric left ventricular  hypertrophy of the septal segment. Left  ventricular diastolic parameters are consistent with Grade II diastolic  dysfunction (pseudonormalization). Elevated left ventricular end-diastolic  pressure.  2. Right ventricular systolic function is normal. The right ventricular  size is normal. There is normal pulmonary artery systolic pressure. The  estimated right ventricular systolic pressure is 68.3 mmHg.  3. There is a trivial pericardial effusion posterior to the left  ventricle.  4. The mitral valve is grossly normal. Mild mitral valve regurgitation.  5. The aortic valve is tricuspid. Aortic valve regurgitation is not  visualized. No aortic stenosis is present. Aortic valve mean gradient  measures 6.0 mmHg.  6. The inferior vena cava is normal in size with greater than 50%  respiratory variability, suggesting right atrial pressure of 3 mmHg.   Carotid ultrasound 05/15/21: IMPRESSION: 1. Right carotid artery system: Patent without significant atherosclerotic plaque formation.  2. Left carotid artery system: Patent without significant atherosclerotic plaque formation.  3. Vertebral artery system: The left vertebral artery is not visualized. The right vertebral artery is patent with antegrade flow.  )      Anesthesia Quick Evaluation

## 2021-06-03 ENCOUNTER — Ambulatory Visit (HOSPITAL_COMMUNITY): Payer: BC Managed Care – PPO | Admitting: Physician Assistant

## 2021-06-03 ENCOUNTER — Ambulatory Visit (HOSPITAL_COMMUNITY): Payer: BC Managed Care – PPO | Admitting: Vascular Surgery

## 2021-06-03 ENCOUNTER — Observation Stay (HOSPITAL_COMMUNITY)
Admission: RE | Admit: 2021-06-03 | Discharge: 2021-06-04 | Disposition: A | Discharge status: Home / Self Care | Payer: BC Managed Care – PPO | Source: Ambulatory Visit | Attending: Neurosurgery | Admitting: Neurosurgery

## 2021-06-03 ENCOUNTER — Encounter (HOSPITAL_COMMUNITY): Payer: Self-pay

## 2021-06-03 ENCOUNTER — Other Ambulatory Visit: Payer: Self-pay

## 2021-06-03 ENCOUNTER — Ambulatory Visit (HOSPITAL_COMMUNITY): Payer: BC Managed Care – PPO

## 2021-06-03 ENCOUNTER — Ambulatory Visit (HOSPITAL_COMMUNITY): Payer: BC Managed Care – PPO | Admitting: Physical Therapy

## 2021-06-03 ENCOUNTER — Ambulatory Visit (HOSPITAL_COMMUNITY)
Admission: RE | Disposition: A | Discharge status: Home / Self Care | Payer: Self-pay | Source: Ambulatory Visit | Attending: Neurosurgery

## 2021-06-03 DIAGNOSIS — Z79899 Other long term (current) drug therapy: Secondary | ICD-10-CM | POA: Insufficient documentation

## 2021-06-03 DIAGNOSIS — M47812 Spondylosis without myelopathy or radiculopathy, cervical region: Secondary | ICD-10-CM | POA: Diagnosis not present

## 2021-06-03 DIAGNOSIS — I10 Essential (primary) hypertension: Secondary | ICD-10-CM | POA: Diagnosis not present

## 2021-06-03 DIAGNOSIS — G992 Myelopathy in diseases classified elsewhere: Secondary | ICD-10-CM | POA: Diagnosis present

## 2021-06-03 DIAGNOSIS — M4712 Other spondylosis with myelopathy, cervical region: Secondary | ICD-10-CM | POA: Diagnosis not present

## 2021-06-03 DIAGNOSIS — M4802 Spinal stenosis, cervical region: Secondary | ICD-10-CM | POA: Diagnosis not present

## 2021-06-03 DIAGNOSIS — Z419 Encounter for procedure for purposes other than remedying health state, unspecified: Secondary | ICD-10-CM

## 2021-06-03 DIAGNOSIS — E785 Hyperlipidemia, unspecified: Secondary | ICD-10-CM | POA: Diagnosis not present

## 2021-06-03 DIAGNOSIS — Z4682 Encounter for fitting and adjustment of non-vascular catheter: Secondary | ICD-10-CM | POA: Diagnosis not present

## 2021-06-03 DIAGNOSIS — M4322 Fusion of spine, cervical region: Secondary | ICD-10-CM | POA: Diagnosis not present

## 2021-06-03 HISTORY — PX: ANTERIOR CERVICAL DECOMP/DISCECTOMY FUSION: SHX1161

## 2021-06-03 LAB — GLUCOSE, CAPILLARY
Glucose-Capillary: 140 mg/dL — ABNORMAL HIGH (ref 70–99)
Glucose-Capillary: 120 mg/dL — ABNORMAL HIGH (ref 70–99)
Glucose-Capillary: 166 mg/dL — ABNORMAL HIGH (ref 70–99)

## 2021-06-03 LAB — SARS CORONAVIRUS 2 (TAT 6-24 HRS): SARS Coronavirus 2: NEGATIVE

## 2021-06-03 LAB — ABO/RH: ABO/RH(D): A POS

## 2021-06-03 IMAGING — RF DG CERVICAL SPINE 2 OR 3 VIEWS
1 series · 2 of 2 positions shown · non-contrast
Comparison: [DATE]

CLINICAL DATA: C3-4 ACDF

EXAM:
CERVICAL SPINE - 2-3 VIEW

[Series 1: run · 2 of 2 slices shown]
[im 1/2]
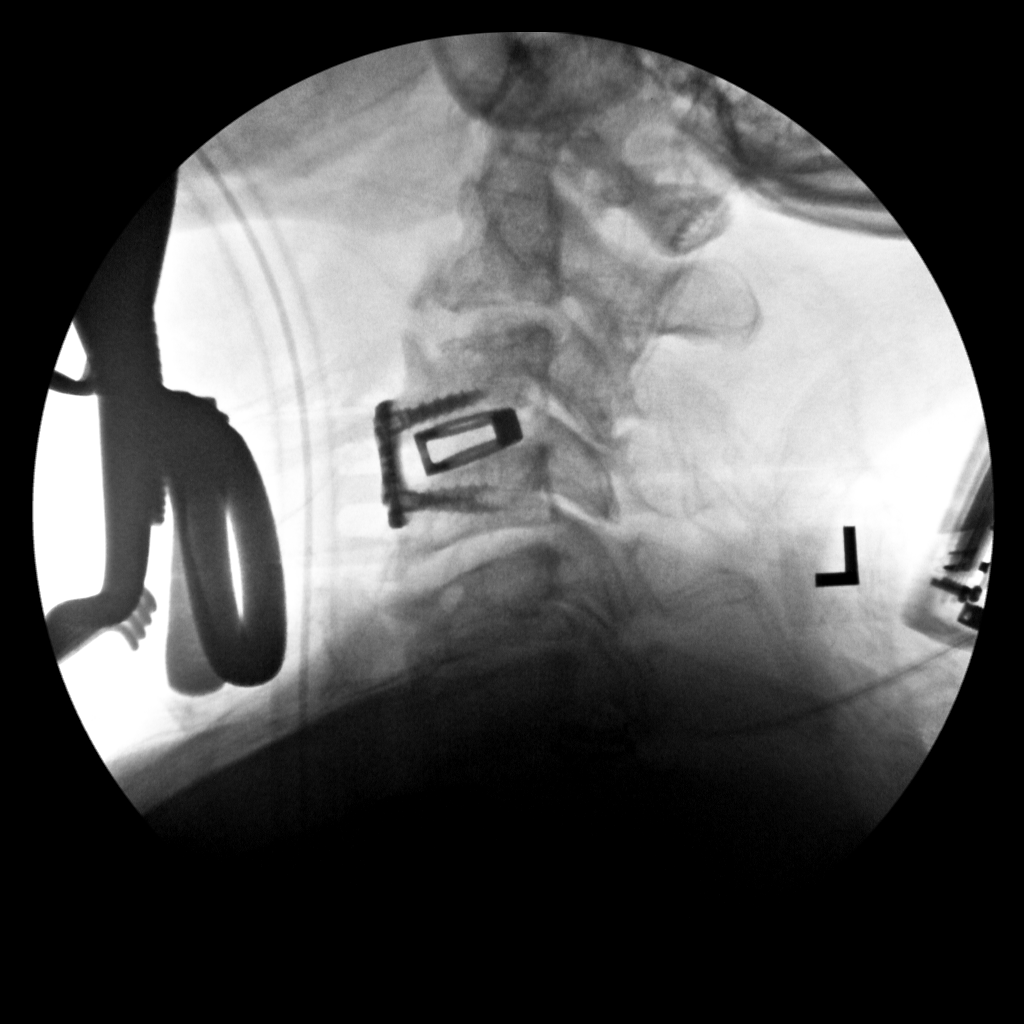
[im 2/2]
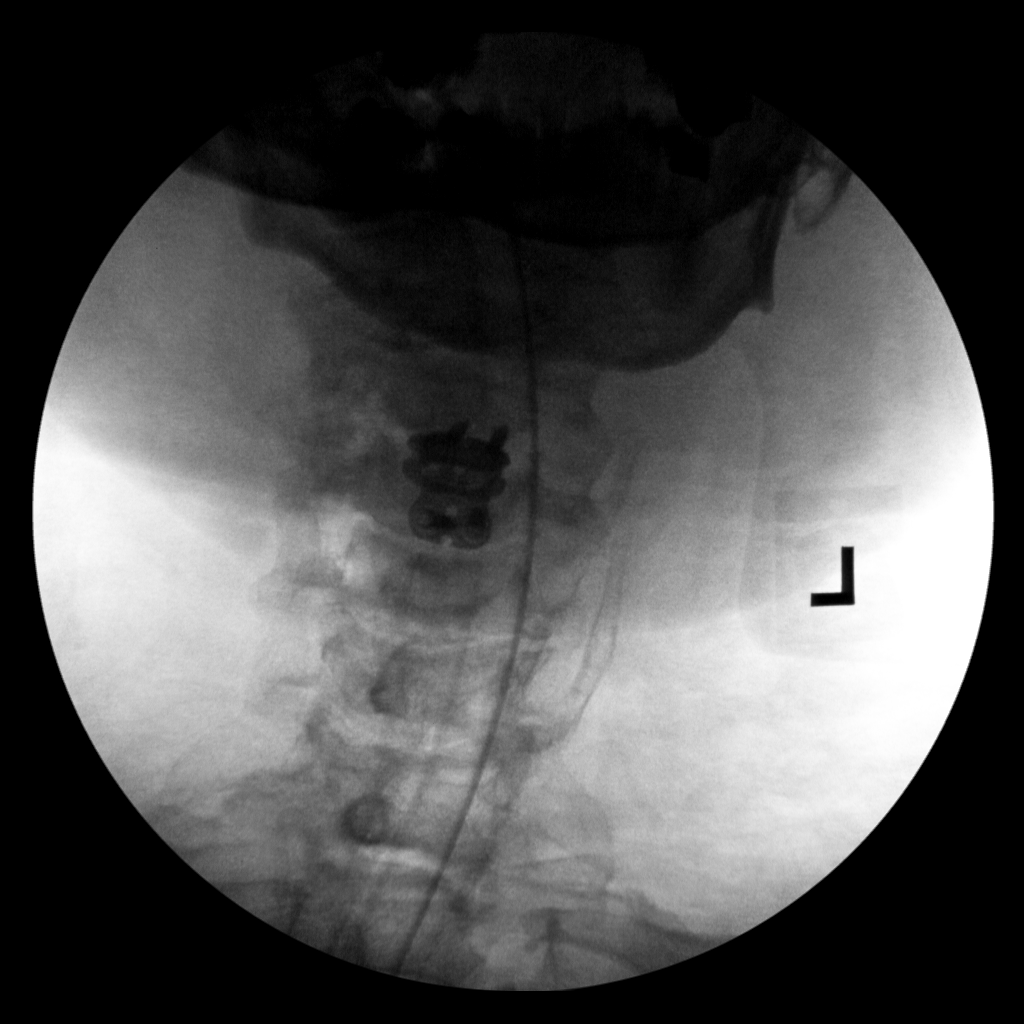

[2 of 2 positions shown; findings below may reference images not displayed]

FINDINGS: 2 fluoroscopic images are obtained during the performance of the
procedure and are provided for interpretation only. Images
demonstrate ACDF at the C3-4 level with anatomic alignment. Stable
spondylosis at C2-3 and C4-5. Endotracheal tube is identified.

FLUOROSCOPY TIME:  18 seconds
IMPRESSION: 1. Intraoperative evaluation from C3-4 ACDF.

## 2021-06-03 SURGERY — ANTERIOR CERVICAL DECOMPRESSION/DISCECTOMY FUSION 1 LEVEL
Anesthesia: General

## 2021-06-03 MED ORDER — CHLORHEXIDINE GLUCONATE 0.12 % MT SOLN
OROMUCOSAL | Status: AC
  Administered 2021-06-03: 15 mL via OROMUCOSAL
  Filled 2021-06-03: qty 15

## 2021-06-03 MED ORDER — POTASSIUM CHLORIDE IN NACL 20-0.9 MEQ/L-% IV SOLN: INTRAVENOUS | Status: DC

## 2021-06-03 MED ORDER — MENTHOL 3 MG MT LOZG: 1.0000 | LOZENGE | OROMUCOSAL | Status: DC | PRN

## 2021-06-03 MED ORDER — OXYCODONE HCL 5 MG PO TABS: 5.0000 mg | ORAL_TABLET | Freq: Once | ORAL | Status: DC | PRN

## 2021-06-03 MED ORDER — 0.9 % SODIUM CHLORIDE (POUR BTL) OPTIME
TOPICAL | Status: DC | PRN
  Administered 2021-06-03: 1000 mL

## 2021-06-03 MED ORDER — MIDAZOLAM HCL 5 MG/5ML IJ SOLN
INTRAMUSCULAR | Status: DC | PRN
Start: 2021-06-03 — End: 2021-06-03
  Administered 2021-06-03: 2 mg via INTRAVENOUS

## 2021-06-03 MED ORDER — PROPOFOL 10 MG/ML IV BOLUS
INTRAVENOUS | Status: DC | PRN
  Administered 2021-06-03: 200 mg via INTRAVENOUS

## 2021-06-03 MED ORDER — HYDROMORPHONE HCL 1 MG/ML IJ SOLN
0.2500 mg | INTRAMUSCULAR | Status: DC | PRN
  Administered 2021-06-03: 0.25 mg via INTRAVENOUS

## 2021-06-03 MED ORDER — METFORMIN HCL 500 MG PO TABS
1000.0000 mg | ORAL_TABLET | Freq: Two times a day (BID) | ORAL | Status: DC
  Administered 2021-06-03 – 2021-06-04 (×2): 1000 mg via ORAL
  Filled 2021-06-03 (×2): qty 2

## 2021-06-03 MED ORDER — DEXAMETHASONE SODIUM PHOSPHATE 10 MG/ML IJ SOLN
INTRAMUSCULAR | Status: DC | PRN
  Administered 2021-06-03: 10 mg via INTRAVENOUS

## 2021-06-03 MED ORDER — SODIUM CHLORIDE 0.9% FLUSH: 3.0000 mL | INTRAVENOUS | Status: DC | PRN

## 2021-06-03 MED ORDER — AMISULPRIDE (ANTIEMETIC) 5 MG/2ML IV SOLN: 10.0000 mg | Freq: Once | INTRAVENOUS | Status: DC | PRN

## 2021-06-03 MED ORDER — FUROSEMIDE 20 MG PO TABS
20.0000 mg | ORAL_TABLET | Freq: Every evening | ORAL | Status: DC
  Administered 2021-06-03: 20 mg via ORAL
  Filled 2021-06-03: qty 1

## 2021-06-03 MED ORDER — FLEET ENEMA 7-19 GM/118ML RE ENEM: 1.0000 | ENEMA | Freq: Once | RECTAL | Status: DC | PRN

## 2021-06-03 MED ORDER — ROCURONIUM BROMIDE 10 MG/ML (PF) SYRINGE
PREFILLED_SYRINGE | INTRAVENOUS | Status: DC | PRN
Start: 2021-06-03 — End: 2021-06-03
  Administered 2021-06-03: 100 mg via INTRAVENOUS

## 2021-06-03 MED ORDER — HYDROCHLOROTHIAZIDE 25 MG PO TABS
25.0000 mg | ORAL_TABLET | Freq: Every evening | ORAL | Status: DC
  Administered 2021-06-03: 25 mg via ORAL
  Filled 2021-06-03: qty 1

## 2021-06-03 MED ORDER — PHENYLEPHRINE 40 MCG/ML (10ML) SYRINGE FOR IV PUSH (FOR BLOOD PRESSURE SUPPORT)
PREFILLED_SYRINGE | INTRAVENOUS | Status: AC
  Filled 2021-06-03: qty 10

## 2021-06-03 MED ORDER — OXYCODONE HCL 5 MG PO TABS: 10.0000 mg | ORAL_TABLET | ORAL | Status: DC | PRN

## 2021-06-03 MED ORDER — LORATADINE 10 MG PO TABS
10.0000 mg | ORAL_TABLET | Freq: Every day | ORAL | Status: DC
  Administered 2021-06-04: 10 mg via ORAL
  Filled 2021-06-03: qty 1

## 2021-06-03 MED ORDER — SODIUM CHLORIDE 0.9% FLUSH: 3.0000 mL | Freq: Two times a day (BID) | INTRAVENOUS | Status: DC

## 2021-06-03 MED ORDER — SODIUM CHLORIDE 0.9 % IV SOLN: 250.0000 mL | INTRAVENOUS | Status: DC

## 2021-06-03 MED ORDER — CHLORHEXIDINE GLUCONATE CLOTH 2 % EX PADS: 6.0000 | MEDICATED_PAD | Freq: Once | CUTANEOUS | Status: DC

## 2021-06-03 MED ORDER — SUGAMMADEX SODIUM 200 MG/2ML IV SOLN
INTRAVENOUS | Status: DC | PRN
  Administered 2021-06-03: 400 mg via INTRAVENOUS

## 2021-06-03 MED ORDER — ONDANSETRON HCL 4 MG/2ML IJ SOLN: 4.0000 mg | Freq: Four times a day (QID) | INTRAMUSCULAR | Status: DC | PRN

## 2021-06-03 MED ORDER — FENTANYL CITRATE (PF) 250 MCG/5ML IJ SOLN
INTRAMUSCULAR | Status: AC
  Filled 2021-06-03: qty 5

## 2021-06-03 MED ORDER — OXYCODONE HCL 5 MG/5ML PO SOLN: 5.0000 mg | Freq: Once | ORAL | Status: DC | PRN

## 2021-06-03 MED ORDER — PHENYLEPHRINE HCL-NACL 20-0.9 MG/250ML-% IV SOLN
INTRAVENOUS | Status: DC | PRN
  Administered 2021-06-03: 35 ug/min via INTRAVENOUS

## 2021-06-03 MED ORDER — ACETAMINOPHEN 650 MG RE SUPP: 650.0000 mg | RECTAL | Status: DC | PRN

## 2021-06-03 MED ORDER — LACTATED RINGERS IV SOLN: INTRAVENOUS | Status: DC

## 2021-06-03 MED ORDER — ASCORBIC ACID 500 MG PO TABS
500.0000 mg | ORAL_TABLET | Freq: Every morning | ORAL | Status: DC
  Administered 2021-06-04: 500 mg via ORAL
  Filled 2021-06-03: qty 1

## 2021-06-03 MED ORDER — ROCURONIUM BROMIDE 10 MG/ML (PF) SYRINGE
PREFILLED_SYRINGE | INTRAVENOUS | Status: AC
  Filled 2021-06-03: qty 10

## 2021-06-03 MED ORDER — PROMETHAZINE HCL 25 MG/ML IJ SOLN: 6.2500 mg | INTRAMUSCULAR | Status: DC | PRN

## 2021-06-03 MED ORDER — DOCUSATE SODIUM 100 MG PO CAPS
100.0000 mg | ORAL_CAPSULE | Freq: Two times a day (BID) | ORAL | Status: DC
  Administered 2021-06-03 – 2021-06-04 (×2): 100 mg via ORAL
  Filled 2021-06-03 (×2): qty 1

## 2021-06-03 MED ORDER — METHOCARBAMOL 1000 MG/10ML IJ SOLN
500.0000 mg | Freq: Four times a day (QID) | INTRAVENOUS | Status: DC | PRN
  Filled 2021-06-03: qty 5

## 2021-06-03 MED ORDER — DEXAMETHASONE SODIUM PHOSPHATE 10 MG/ML IJ SOLN
INTRAMUSCULAR | Status: AC
  Filled 2021-06-03: qty 1

## 2021-06-03 MED ORDER — PROPOFOL 10 MG/ML IV BOLUS
INTRAVENOUS | Status: AC
  Filled 2021-06-03: qty 20

## 2021-06-03 MED ORDER — THROMBIN 5000 UNITS EX SOLR
CUTANEOUS | Status: AC
  Filled 2021-06-03: qty 10000

## 2021-06-03 MED ORDER — SUCCINYLCHOLINE CHLORIDE 200 MG/10ML IV SOSY
PREFILLED_SYRINGE | INTRAVENOUS | Status: AC
  Filled 2021-06-03: qty 10

## 2021-06-03 MED ORDER — THROMBIN 5000 UNITS EX SOLR
OROMUCOSAL | Status: DC | PRN
  Administered 2021-06-03 (×2): 5 mL via TOPICAL

## 2021-06-03 MED ORDER — HYDROMORPHONE HCL 1 MG/ML IJ SOLN: 0.5000 mg | INTRAMUSCULAR | Status: DC | PRN

## 2021-06-03 MED ORDER — TAMSULOSIN HCL 0.4 MG PO CAPS
0.4000 mg | ORAL_CAPSULE | Freq: Every evening | ORAL | Status: DC
  Administered 2021-06-03: 0.4 mg via ORAL
  Filled 2021-06-03: qty 1

## 2021-06-03 MED ORDER — OXYCODONE HCL 5 MG PO TABS
5.0000 mg | ORAL_TABLET | ORAL | Status: DC | PRN
  Administered 2021-06-03 – 2021-06-04 (×2): 5 mg via ORAL
  Filled 2021-06-03 (×2): qty 1

## 2021-06-03 MED ORDER — MIDAZOLAM HCL 2 MG/2ML IJ SOLN
INTRAMUSCULAR | Status: AC
  Filled 2021-06-03: qty 2

## 2021-06-03 MED ORDER — THROMBIN 5000 UNITS EX SOLR
CUTANEOUS | Status: AC
  Filled 2021-06-03: qty 5000

## 2021-06-03 MED ORDER — ACETAMINOPHEN 325 MG PO TABS: 650.0000 mg | ORAL_TABLET | ORAL | Status: DC | PRN

## 2021-06-03 MED ORDER — PHENOL 1.4 % MT LIQD: 1.0000 | OROMUCOSAL | Status: DC | PRN

## 2021-06-03 MED ORDER — POTASSIUM CHLORIDE ER 10 MEQ PO TBCR
10.0000 meq | EXTENDED_RELEASE_TABLET | Freq: Every day | ORAL | Status: DC
  Administered 2021-06-03: 10 meq via ORAL
  Filled 2021-06-03 (×2): qty 1

## 2021-06-03 MED ORDER — CLONIDINE HCL 0.2 MG PO TABS
0.2000 mg | ORAL_TABLET | Freq: Three times a day (TID) | ORAL | Status: DC
  Administered 2021-06-03 – 2021-06-04 (×3): 0.2 mg via ORAL
  Filled 2021-06-03 (×5): qty 1

## 2021-06-03 MED ORDER — METHOCARBAMOL 500 MG PO TABS
500.0000 mg | ORAL_TABLET | Freq: Four times a day (QID) | ORAL | Status: DC | PRN
  Administered 2021-06-03 – 2021-06-04 (×2): 500 mg via ORAL
  Filled 2021-06-03 (×2): qty 1

## 2021-06-03 MED ORDER — CEFAZOLIN SODIUM-DEXTROSE 2-4 GM/100ML-% IV SOLN
2.0000 g | Freq: Three times a day (TID) | INTRAVENOUS | Status: AC
  Administered 2021-06-03 – 2021-06-04 (×2): 2 g via INTRAVENOUS
  Filled 2021-06-03 (×2): qty 100

## 2021-06-03 MED ORDER — LIDOCAINE 2% (20 MG/ML) 5 ML SYRINGE
INTRAMUSCULAR | Status: AC
  Filled 2021-06-03: qty 5

## 2021-06-03 MED ORDER — LIDOCAINE 2% (20 MG/ML) 5 ML SYRINGE
INTRAMUSCULAR | Status: DC | PRN
  Administered 2021-06-03: 100 mg via INTRAVENOUS

## 2021-06-03 MED ORDER — LIDOCAINE-EPINEPHRINE 1 %-1:100000 IJ SOLN
INTRAMUSCULAR | Status: AC
  Filled 2021-06-03: qty 1

## 2021-06-03 MED ORDER — POLYETHYLENE GLYCOL 3350 17 G PO PACK: 17.0000 g | PACK | Freq: Every day | ORAL | Status: DC | PRN

## 2021-06-03 MED ORDER — ONDANSETRON HCL 4 MG PO TABS: 4.0000 mg | ORAL_TABLET | Freq: Four times a day (QID) | ORAL | Status: DC | PRN

## 2021-06-03 MED ORDER — DILTIAZEM HCL ER COATED BEADS 120 MG PO CP24
360.0000 mg | ORAL_CAPSULE | Freq: Every day | ORAL | Status: DC
  Administered 2021-06-04: 360 mg via ORAL
  Filled 2021-06-03: qty 3

## 2021-06-03 MED ORDER — CEFAZOLIN SODIUM-DEXTROSE 2-4 GM/100ML-% IV SOLN: 2.0000 g | INTRAVENOUS | Status: DC

## 2021-06-03 MED ORDER — MEPERIDINE HCL 25 MG/ML IJ SOLN: 6.2500 mg | INTRAMUSCULAR | Status: DC | PRN

## 2021-06-03 MED ORDER — ATORVASTATIN CALCIUM 80 MG PO TABS
80.0000 mg | ORAL_TABLET | Freq: Every day | ORAL | Status: DC
  Administered 2021-06-03: 80 mg via ORAL
  Filled 2021-06-03: qty 1

## 2021-06-03 MED ORDER — CEFAZOLIN SODIUM-DEXTROSE 2-3 GM-%(50ML) IV SOLR
INTRAVENOUS | Status: DC | PRN
  Administered 2021-06-03: 2 g via INTRAVENOUS

## 2021-06-03 MED ORDER — CHLORHEXIDINE GLUCONATE 0.12 % MT SOLN: 15.0000 mL | Freq: Once | OROMUCOSAL | Status: AC

## 2021-06-03 MED ORDER — CEFAZOLIN SODIUM-DEXTROSE 2-4 GM/100ML-% IV SOLN
INTRAVENOUS | Status: AC
  Filled 2021-06-03: qty 100

## 2021-06-03 MED ORDER — FENTANYL CITRATE (PF) 100 MCG/2ML IJ SOLN
INTRAMUSCULAR | Status: DC | PRN
  Administered 2021-06-03: 50 ug via INTRAVENOUS
  Administered 2021-06-03: 100 ug via INTRAVENOUS
  Administered 2021-06-03: 50 ug via INTRAVENOUS

## 2021-06-03 MED ORDER — LIDOCAINE-EPINEPHRINE 1 %-1:100000 IJ SOLN
INTRAMUSCULAR | Status: DC | PRN
  Administered 2021-06-03: 8 mL

## 2021-06-03 MED ORDER — HYDROMORPHONE HCL 1 MG/ML IJ SOLN
INTRAMUSCULAR | Status: AC
  Filled 2021-06-03: qty 1

## 2021-06-03 MED ORDER — ORAL CARE MOUTH RINSE: 15.0000 mL | Freq: Once | OROMUCOSAL | Status: AC

## 2021-06-03 MED ORDER — ONDANSETRON HCL 4 MG/2ML IJ SOLN
INTRAMUSCULAR | Status: AC
  Filled 2021-06-03: qty 2

## 2021-06-03 SURGICAL SUPPLY — 71 items
APL SKNCLS STERI-STRIP NONHPOA (GAUZE/BANDAGES/DRESSINGS) ×1
BAG COUNTER SPONGE SURGICOUNT (BAG) ×3 IMPLANT
BAG SPNG CNTER NS LX DISP (BAG) ×2
BAND INSRT 18 STRL LF DISP RB (MISCELLANEOUS) ×2
BAND RUBBER #18 3X1/16 STRL (MISCELLANEOUS) ×4 IMPLANT
BASKET BONE COLLECTION (BASKET) ×1 IMPLANT
BENZOIN TINCTURE PRP APPL 2/3 (GAUZE/BANDAGES/DRESSINGS) ×2 IMPLANT
BIT DRILL 13 (BIT) ×1 IMPLANT
BIT DRILL NEURO 2X3.1 SFT TUCH (MISCELLANEOUS) ×1 IMPLANT
BLADE CLIPPER SURG (BLADE) IMPLANT
BLADE SURG 15 STRL LF DISP TIS (BLADE) IMPLANT
BLADE SURG 15 STRL SS (BLADE)
BLADE ULTRA TIP 2M (BLADE) IMPLANT
BUR MATCHSTICK NEURO 3.0 LAGG (BURR) ×2 IMPLANT
CAGE CERV NANOLOCK LG 7 6D (Spacer) ×1 IMPLANT
CANISTER SUCT 3000ML PPV (MISCELLANEOUS) ×2 IMPLANT
COLLAR CERV LO CONTOUR FIRM DE (SOFTGOODS) ×2 IMPLANT
DECANTER SPIKE VIAL GLASS SM (MISCELLANEOUS) ×2 IMPLANT
DRAPE C-ARM 42X72 X-RAY (DRAPES) ×4 IMPLANT
DRAPE LAPAROTOMY 100X72 PEDS (DRAPES) ×2 IMPLANT
DRAPE MICROSCOPE LEICA (MISCELLANEOUS) ×2 IMPLANT
DRAPE SHEET LG 3/4 BI-LAMINATE (DRAPES) ×2 IMPLANT
DRESSING MEPILEX FLEX 4X4 (GAUZE/BANDAGES/DRESSINGS) ×1 IMPLANT
DRILL NEURO 2X3.1 SOFT TOUCH (MISCELLANEOUS) ×2
DRSG MEPILEX FLEX 4X4 (GAUZE/BANDAGES/DRESSINGS)
DRSG OPSITE 4X5.5 SM (GAUZE/BANDAGES/DRESSINGS) ×4 IMPLANT
DRSG OPSITE POSTOP 3X4 (GAUZE/BANDAGES/DRESSINGS) ×1 IMPLANT
DRSG OPSITE POSTOP 4X6 (GAUZE/BANDAGES/DRESSINGS) ×1 IMPLANT
DURAPREP 26ML APPLICATOR (WOUND CARE) ×2 IMPLANT
ELECT COATED BLADE 2.86 ST (ELECTRODE) ×2 IMPLANT
ELECT REM PT RETURN 9FT ADLT (ELECTROSURGICAL) ×2
ELECTRODE REM PT RTRN 9FT ADLT (ELECTROSURGICAL) ×1 IMPLANT
EVACUATOR 1/8 PVC DRAIN (DRAIN) ×1 IMPLANT
GAUZE 4X4 16PLY ~~LOC~~+RFID DBL (SPONGE) ×1 IMPLANT
GLOVE SURG LTX SZ7.5 (GLOVE) ×2 IMPLANT
GLOVE SURG UNDER POLY LF SZ6.5 (GLOVE) ×1 IMPLANT
GLOVE SURG UNDER POLY LF SZ7 (GLOVE) ×1 IMPLANT
GLOVE SURG UNDER POLY LF SZ7.5 (GLOVE) ×3 IMPLANT
GOWN STRL REUS W/ TWL LRG LVL3 (GOWN DISPOSABLE) ×2 IMPLANT
GOWN STRL REUS W/ TWL XL LVL3 (GOWN DISPOSABLE) ×1 IMPLANT
GOWN STRL REUS W/TWL 2XL LVL3 (GOWN DISPOSABLE) IMPLANT
GOWN STRL REUS W/TWL LRG LVL3 (GOWN DISPOSABLE) ×4
GOWN STRL REUS W/TWL XL LVL3 (GOWN DISPOSABLE) ×4
HEMOSTAT POWDER KIT SURGIFOAM (HEMOSTASIS) ×3 IMPLANT
KIT BASIN OR (CUSTOM PROCEDURE TRAY) ×2 IMPLANT
KIT TURNOVER KIT B (KITS) ×2 IMPLANT
NDL SPNL 22GX3.5 QUINCKE BK (NEEDLE) ×1 IMPLANT
NEEDLE HYPO 22GX1.5 SAFETY (NEEDLE) ×2 IMPLANT
NEEDLE SPNL 22GX3.5 QUINCKE BK (NEEDLE) ×2 IMPLANT
NS IRRIG 1000ML POUR BTL (IV SOLUTION) ×2 IMPLANT
PACK LAMINECTOMY NEURO (CUSTOM PROCEDURE TRAY) ×2 IMPLANT
PAD ARMBOARD 7.5X6 YLW CONV (MISCELLANEOUS) ×3 IMPLANT
PATTIES SURGICAL .5 X3 (DISPOSABLE) ×2 IMPLANT
PIN DISTRACTION 14MM (PIN) ×4 IMPLANT
PLATE ZEVO 1LVL 19MM (Plate) ×1 IMPLANT
PUTTY DBF 1CC CORTICAL FIBERS (Putty) ×1 IMPLANT
SCREW VA SD 3.5X16 (Screw) ×4 IMPLANT
SPONGE INTESTINAL PEANUT (DISPOSABLE) ×2 IMPLANT
SPONGE SURGIFOAM ABS GEL SZ50 (HEMOSTASIS) IMPLANT
SPONGE T-LAP 4X18 ~~LOC~~+RFID (SPONGE) ×1 IMPLANT
STAPLER VISISTAT 35W (STAPLE) IMPLANT
STRIP CLOSURE SKIN 1/2X4 (GAUZE/BANDAGES/DRESSINGS) ×2 IMPLANT
SUT MNCRL AB 4-0 PS2 18 (SUTURE) ×2 IMPLANT
SUT SILK 2 0 TIES 10X30 (SUTURE) IMPLANT
SUT VIC AB 0 CT1 27 (SUTURE)
SUT VIC AB 0 CT1 27XBRD ANTBC (SUTURE) IMPLANT
SUT VIC AB 3-0 SH 8-18 (SUTURE) ×2 IMPLANT
TAPE CLOTH 3X10 TAN LF (GAUZE/BANDAGES/DRESSINGS) ×2 IMPLANT
TOWEL GREEN STERILE (TOWEL DISPOSABLE) ×2 IMPLANT
TOWEL GREEN STERILE FF (TOWEL DISPOSABLE) ×2 IMPLANT
WATER STERILE IRR 1000ML POUR (IV SOLUTION) ×2 IMPLANT

## 2021-06-03 NOTE — Anesthesia Procedure Notes (Signed)
Procedure Name: Intubation Date/Time: 06/03/2021 11:30 AM Performed by: Lavell Luster, CRNA Pre-anesthesia Checklist: Patient identified, Emergency Drugs available, Suction available, Patient being monitored and Timeout performed Patient Re-evaluated:Patient Re-evaluated prior to induction Oxygen Delivery Method: Circle system utilized Preoxygenation: Pre-oxygenation with 100% oxygen Induction Type: IV induction Ventilation: Mask ventilation without difficulty Laryngoscope Size: Mac, 3 and Glidescope Grade View: Grade I Tube type: Oral Tube size: 7.5 mm Number of attempts: 1 Airway Equipment and Method: Stylet and Video-laryngoscopy Placement Confirmation: ETT inserted through vocal cords under direct vision, positive ETCO2 and breath sounds checked- equal and bilateral Secured at: 22 cm Tube secured with: Tape Dental Injury: Teeth and Oropharynx as per pre-operative assessment

## 2021-06-03 NOTE — Progress Notes (Signed)
Orthopedic Tech Progress Note Patient Details:  Andrew Arnold Apr 10, 1956 [id number]  Patient ID: Andrew Arnold, male   DOB: [birth year], 65 y.o.   MRN: [id number]  Andrew Arnold 06/03/2021, 4:02 PM Per the RN, the patient already has the collar

## 2021-06-03 NOTE — H&P (Signed)
CC: myelopathy  HPI:     Patient is a 65 y.o. male presented with progressive ambulatory dysfunction and right sided weakness and numbness.  He was found to have severe cervical stenosis with cord impingement.    Patient Active Problem List   Diagnosis Date Noted   Macrocytic anemia 05/15/2021   Bilateral lower extremity edema 05/15/2021   BPH (benign prostatic hyperplasia) 05/15/2021   Elevated PSA 05/15/2021   Cervical spinal stenosis 05/15/2021   DDD (degenerative disc disease), cervical 05/15/2021   Syncope 05/14/2021   Elevated hemoglobin A1c 04/10/2021   Chest pain 08/29/2017   Fever 08/29/2017   Acute URI 08/29/2017   Moderate dehydration 08/29/2017   Sepsis ([place]) 08/29/2017   Elevated lactic acid level 08/29/2017   Acute gastroenteritis 08/29/2017   Diarrhea 08/29/2017   GERD (gastroesophageal reflux disease) 08/29/2017   Positive D dimer 08/29/2017   Encounter for screening colonoscopy 02/01/2014   Orthostatic hypotension 01/15/2014   Dyslipidemia 08/14/2008   Essential hypertension 08/14/2008   Seasonal and perennial allergic rhinitis 04/23/2007   Past Medical History:  Diagnosis Date   Allergic rhinitis due to pollen    on allergy shots   Arrhythmia    1980s hospitalized for HTN and ?irregular rhythm but no specific dx but resolved during hospitalization   Diabetes mellitus without complication ([place])    HTN (hypertension)    Hyperlipidemia     Past Surgical History:  Procedure Laterality Date   colonoscopy  2005   Dr. Gala Romney: normal rectum, sigmoid diverticula in colonic mucosa   COLONOSCOPY N/A 02/15/2014   Procedure: COLONOSCOPY;  Surgeon: Daneil Dolin, MD;  Location: [facility];  Service: Endoscopy;  Laterality: N/A;  2:00   RIGHT/LEFT HEART CATH AND CORONARY ANGIOGRAPHY N/A 05/23/2021   Procedure: RIGHT/LEFT HEART CATH AND CORONARY ANGIOGRAPHY;  Surgeon: Jolaine Artist, MD;  Location: [facility];  Service: Cardiovascular;  Laterality:  N/A;    Medications Prior to Admission  Medication Sig Dispense Refill Last Dose   ascorbic acid (VITAMIN C) 500 MG tablet Take 500 mg by mouth every morning.    Past Week   atorvastatin (LIPITOR) 80 MG tablet Take 80 mg by mouth at bedtime.   06/02/2021   B Complex Vitamins (B COMPLEX 100 PO) Take 1 tablet by mouth every morning.    Past Week   cetirizine (ZYRTEC) 10 MG tablet Take 10 mg by mouth in the morning.   06/03/2021 at 0700   cloNIDine (CATAPRES) 0.2 MG tablet Take 0.2 mg by mouth 3 (three) times daily.   06/03/2021 at 0700   diltiazem (CARDIZEM CD) 360 MG 24 hr capsule Take 360 mg by mouth daily.   06/03/2021 at 0700   furosemide (LASIX) 20 MG tablet Take 1 tablet (20 mg total) by mouth daily. (Patient taking differently: Take 20 mg by mouth every evening.) 30 tablet 3 06/02/2021   hydrochlorothiazide (HYDRODIURIL) 25 MG tablet Take 25 mg by mouth every evening.   06/02/2021   metFORMIN (GLUCOPHAGE) 1000 MG tablet Take 1,000 mg by mouth 2 (two) times daily.   06/02/2021   potassium chloride (K-DUR) 10 MEQ tablet Take 1 tablet by mouth daily in the afternoon.  1 Past Week   predniSONE (DELTASONE) 20 MG tablet Take 60 mg by mouth daily in the afternoon.   06/02/2021   tamsulosin (FLOMAX) 0.4 MG CAPS capsule Take 1 capsule (0.4 mg total) by mouth daily. (Patient taking differently: Take 0.4 mg by mouth every evening.) 30 capsule  11 06/02/2021   No Known Allergies  Social History   Tobacco Use   Smoking status: Never   Smokeless tobacco: Never  Substance Use Topics   Alcohol use: No    Family History  Problem Relation Age of Onset   Cancer Mother    Cirrhosis Father        cirrhosis of the liver; alcohol related   Diabetes Sister    Other Other        neice was on allergy vaccine when young   Colon cancer Neg Hx      Review of Systems Pertinent items noted in HPI and remainder of comprehensive ROS otherwise negative.  Objective:   Patient Vitals for the past 8 hrs:   BP Temp Temp src Pulse Resp SpO2 Height Weight  06/03/21 0819 (!) 148/84 97.9 F (36.6 C) Oral 72 18 100 % 5\' 9"  (1.753 m) 113.4 kg   No intake/output data recorded. No intake/output data recorded.      General : Alert, cooperative, no distress, appears stated age   Head:  Normocephalic/atraumatic    Eyes: PERRL, conjunctiva/corneas clear, EOM's intact. Fundi could not be visualized Neck: Supple Chest:  Respirations unlabored Chest wall: no tenderness or deformity Heart: Regular rate and rhythm Abdomen: Soft, nontender and nondistended Extremities: warm and well-perfused Skin: normal turgor, color and texture Neurologic:  Alert, oriented x 3.  Eyes open spontaneously. PERRL, EOMI, VFC, no facial droop. V1-3 intact.  No dysarthria, tongue protrusion symmetric.  CNII-XII intact. 4/5 Right hand grip, right sided dysmetria, 3+ reflexes       Data ReviewCBC:  Lab Results  Component Value Date   WBC 5.9 06/02/2021   RBC 3.78 (L) 06/02/2021   BMP:  Lab Results  Component Value Date   GLUCOSE 108 (H) 06/02/2021   CO2 26 06/02/2021   BUN 26 (H) 06/02/2021   CREATININE 1.12 06/02/2021   CALCIUM 8.9 06/02/2021   Radiology review:  See clinic note  Assessment:   Active Problems:   * No active hospital problems. *  Cervical spondylitic myelopathy Plan:   - plan for C3-4 ACDF today - given significant ambulatory dysfunction, will likely admit overnight for observation to allow home PT/OT and any assistive devices to be set up

## 2021-06-03 NOTE — Op Note (Signed)
PREOP DIAGNOSIS: Cervical spondylitic myelopathy  POSTOP DIAGNOSIS: cervical spondylitic myelopathy   PROCEDURE: 1. Arthrodesis C3-4, anterior interbody technique, including Discectomy for decompression of spinal cord and exiting nerve roots with foraminotomies  2. Placement of intervertebral biomechanical device C3-4 3. Placement of anterior instrumentation consisting of interbody plate and screws H0-8 4. Use of morselized bone allograft  5. Use of intraoperative microscope  SURGEON: Dr. Duffy Rhody, MD  ASSISTANT: Emelda Brothers, MD.  Please note, no qualified trainees were available to assist with the procedure.  Assistance was required for retraction of the visceral structures to safely allow for instrumentation.  ANESTHESIA: General Endotracheal  EBL: 25 ml cc  IMPLANTS: Medtronic 7 x 18 x 16 mm Titan-C cage 19 mm Zevo plate 16 mm screws x 4  SPECIMENS: None  DRAINS: Hemovac drain  COMPLICATIONS: None immediate  CONDITION: Hemodynamically stable to PACU  HISTORY: HOLDAN STUCKE is a 65 y.o. y.o. male who developed progressive myelopathy and was found to have severe cervical stenosis with cord impingement and cord signal change at C3-4.  Risks, benefits, alternatives, and expected convalescence were discussed with the patient.  Risks discussed included but were not limited to bleeding, pain, infection, pseudoarthrosis, hardware failure, adjacent segment disease, CSF leak, neurologic deficits, weakness, numbness, paralysis, coma, and death. After all questions were answered, informed consent was obtained.  PROCEDURE IN DETAIL: The patient was brought to the operating room and transferred to the operative table. After induction of general anesthesia, the patient was positioned on the operative table in the supine position with all pressure points meticulously padded. The skin of the neck was then prepped and draped in the usual sterile fashion.  After timeout was  conducted, the skin was infiltrated with local anesthetic. Skin incision was then made sharply and Bovie electrocautery was used to dissect the subcutaneous tissue until the platysma was identified. The platysma was then divided and undermined. The sternocleidomastoid muscle was then identified and, utilizing natural fascial planes in the neck, the prevertebral fascia was identified and the carotid sheath was retracted laterally and the trachea and esophagus retracted medially. Again using fluoroscopy, the  disc space was identified. Bovie electrocautery was used to dissect in the subperiosteal plane and elevate the bilateral longus coli muscles. Self-retaining retractors were then placed. Caspar distraction pins were placed in the adjacent bodies to allow for gentle distraction.  At this point, the microscope was draped and brought into the field, and the remainder of the case was done under the microscope using microdissecting technique.  The disc space was incised sharply and combination of high speed drill, curettes, and rongeurs were use to initially complete a discectomy. The high-speed drill was then used to complete discectomy until the posterior annulus was identified and removed and the posterior longitudinal ligament was identified. Using a nerve hook, the PLL was elevated, and Kerrison rongeurs were used to remove the posterior longitudinal ligament and the ventral thecal sac was identified. Using a combination of curettes and rongeurs, complete decompression of the thecal sac and exiting nerve roots at this level was completed, and verified with easy passage of micro-nerve hook centrally and in the bilateral foramina.  Having completed our decompression, attention was turned to placement of the intervertebral device. Trial spacers were used to select a size 7 mm graft. This graft was then filled with morcellized allograft, and inserted under live fluoroscopy.  After placement of the intervertebral  devices, the caspar pins were removed.  Anterior osteophytes were drilled away to  provide a flat surface for the plate.  An anterior cervical plate was placed across the interspaces for anterior fixation.  Using a high-speed drill, the cortex of the cervical vertebral bodies was punctured, and screws inserted in the vertebral bodies. Final fluoroscopic images in AP and lateral projections were taken to confirm good hardware placement.  At this point, after all counts were verified to be correct, meticulous hemostasis was secured using a combination of bipolar electrocautery and passive hemostatics.  As he had a fair amount of weepiness from his neck fat, I elected to place a Hemovac neck drain that was tunneled out the skin and secured with a stitchThe platysma muscle was then closed using interrupted 3-0 Vicryl sutures, and the skin was closed with a 4-0 monocryl in subcuticular fashion followed by steri-strips. Sterile dressings were then applied and the drapes removed.  The patient tolerated the procedure well and was extubated in the room and taken to the postanesthesia care unit in stable condition.  All counts were correct at the end of the procedure.

## 2021-06-03 NOTE — Transfer of Care (Signed)
Immediate Anesthesia Transfer of Care Note  Patient: Andrew Arnold  Procedure(s) Performed: Anterior Cervical Decompression Fusion  Cervical three-four  Patient Location: PACU  Anesthesia Type:General  Level of Consciousness: awake, alert  and oriented  Airway & Oxygen Therapy: Patient connected to face mask oxygen  Post-op Assessment: Post -op Vital signs reviewed and stable  Post vital signs: stable  Last Vitals:  Vitals Value Taken Time  BP 149/74 06/03/21 1401  Temp    Pulse 63 06/03/21 1402  Resp 16 06/03/21 1402  SpO2 100 % 06/03/21 1402  Vitals shown include unvalidated device data.  Last Pain:  Vitals:   06/03/21 0823  TempSrc:   PainSc: 0-No pain      Patients Stated Pain Goal: 0 (27/51/70 0174)  Complications: No notable events documented.

## 2021-06-04 ENCOUNTER — Encounter (HOSPITAL_COMMUNITY): Payer: Self-pay | Admitting: Neurosurgery

## 2021-06-04 DIAGNOSIS — I10 Essential (primary) hypertension: Secondary | ICD-10-CM | POA: Diagnosis not present

## 2021-06-04 DIAGNOSIS — R531 Weakness: Secondary | ICD-10-CM | POA: Diagnosis not present

## 2021-06-04 DIAGNOSIS — Z79899 Other long term (current) drug therapy: Secondary | ICD-10-CM | POA: Diagnosis not present

## 2021-06-04 DIAGNOSIS — M4802 Spinal stenosis, cervical region: Secondary | ICD-10-CM | POA: Diagnosis not present

## 2021-06-04 LAB — GLUCOSE, CAPILLARY: Glucose-Capillary: 146 mg/dL — ABNORMAL HIGH (ref 70–99)

## 2021-06-04 MED ORDER — OXYCODONE-ACETAMINOPHEN 5-325 MG PO TABS: 1.0000 | ORAL_TABLET | ORAL | 0 refills | Status: DC | PRN

## 2021-06-04 MED ORDER — DOCUSATE SODIUM 100 MG PO CAPS: 100.0000 mg | ORAL_CAPSULE | Freq: Two times a day (BID) | ORAL | 2 refills | Status: DC

## 2021-06-04 MED FILL — Thrombin For Soln 5000 Unit: CUTANEOUS | Qty: 5000 | Status: AC

## 2021-06-04 NOTE — Progress Notes (Signed)
Patient awaiting transport to his vehicle via wheelchair by volunteer for discharge home; in no acute distress nor complaints of pain nor discomfort; incision on his neck with honeycomb dressing with soft collar on and is clean, dry and intact; room was checked and accounted for all his belongings; discharge instructions concerning his medications, incision care, follow up appointment and when to call the doctor as needed were all discussed with patient by RN and patient expressed understanding on the instructions given.

## 2021-06-04 NOTE — Discharge Instructions (Addendum)
Take 1/2 tablet of prednisone for next 3 days, then stop  Wound Care  Keep the incision clean and dry remove the outer dressing in 2 days, leave the Steri-Strips intact.  Do not put any creams, lotions, or ointments on incision. Leave steri-strips on neck.  They will fall off by themselves.  Activity Walk each and every day, increasing distance each day. No lifting greater than 5 lbs.  Avoid excessive neck motion. No lifting no bending no twisting no driving or riding a car unless coming back and forth to see me. Wear neck brace at all times except when showering.   Diet Resume your normal diet.   Return to Work Will be discussed at you follow up appointment.  Call Your Doctor If Any of These Occur Redness, drainage, or swelling at the wound.  Temperature greater than 101 degrees. Severe pain not relieved by pain medication. Incision starts to come apart.  Follow Up Appt Call today for appointment in 1-2 weeks ([phone]) or for problems.

## 2021-06-04 NOTE — Progress Notes (Signed)
With regards to prednisone, patient said he was prescribed this by his neurologist but has only been taking 1 pill daily for the past week.  I will have him taper off this, taking 1/2 pill for the next 3 days, then stopping.

## 2021-06-04 NOTE — Plan of Care (Signed)

## 2021-06-04 NOTE — Discharge Summary (Signed)
  Physician Discharge Summary  Patient ID: Andrew Arnold MRN: [id number] DOB/AGE: 1956-05-16 65 y.o.  Admit date: 06/03/2021 Discharge date: 06/04/2021  Admission Diagnoses:  Cervical stenosis with myelopathy  Discharge Diagnoses:  Same Principal Problem:   Stenosis of cervical spine with myelopathy [facility])   Discharged Condition: Stable  Hospital Course:  Andrew Arnold is a 65 y.o. male who underwent elective C3-4 ACDF for cervical stenosis with severe myelopathy.  Given his significant myelopathy, he was admitted postoperatively to help with setting up possible home PT.  He was admitted to the spine unit postoperatively and was mobilized with PT/OT and nursing.  He stated he noticed improved ambulation compared with preop.  He was deemed ready for d/c home on 11/23.  His pain was well-controlled.  Treatments: Surgery - C3-4 ACDF  Discharge Exam: Blood pressure (!) 142/76, pulse 68, temperature 97.8 F (36.6 C), temperature source Oral, resp. rate 18, height 5\' 9"  (1.753 m), weight 113.4 kg, SpO2 100 %. Awake, alert, oriented Speech fluent, appropriate CN grossly intact 5/5 BUE/BLE except 4+/5 R HG Wound c/d/i  Disposition: Discharge disposition: 01-Home or Self Care       Discharge Instructions     Incentive spirometry RT   Complete by: As directed          Signed: Vallarie Mare 06/04/2021, 9:11 AM

## 2021-06-04 NOTE — Evaluation (Signed)
Physical Therapy Evaluation Patient Details Name: Andrew Arnold MRN: [id number] DOB: [birth year] Today's Date: 06/04/2021  History of Present Illness  Pt is 65 y.o. 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 PMH significant for DM, HTN.  Clinical Impression  Pt admitted with above diagnosis. At the time of PT eval, pt was able to demonstrate transfers and ambulation with gross min guard assist to supervision for safety and RW for support. Pt was educated on precautions, brace application/wearing schedule, appropriate activity progression, and car transfer. Pt currently with functional limitations due to the deficits listed below (see PT Problem List). Pt will benefit from skilled PT to increase their independence and safety with mobility to allow discharge to the venue listed below.         Recommendations for follow up therapy are one component of a multi-disciplinary discharge planning process, led by the attending physician.  Recommendations may be updated based on patient status, additional functional criteria and insurance authorization.  Follow Up Recommendations No PT follow up    Assistance Recommended at Discharge PRN  Functional Status Assessment Patient has had a recent decline in their functional status and demonstrates the ability to make significant improvements in function in a reasonable and predictable amount of time.  Equipment Recommendations  Rolling walker (2 wheels);BSC/3in1    Recommendations for Other Services       Precautions / Restrictions Precautions Precautions: Cervical Precaution Booklet Issued: Yes (comment) Required Braces or Orthoses: Other Brace Other Brace: soft collar Restrictions Weight Bearing Restrictions: No      Mobility  Bed Mobility Overal bed mobility: Needs Assistance Bed Mobility: Sidelying to Sit;Sit to Sidelying   Sidelying to sit: Supervision       General bed mobility comments: VC's for optimal log  roll technique.    Transfers Overall transfer level: Needs assistance Equipment used: Rolling walker (2 wheels) Transfers: Sit to/from Stand Sit to Stand: Min guard           General transfer comment: Hands on guarding for power up to full stand. No assist required    Ambulation/Gait Ambulation/Gait assistance: Supervision Gait Distance (Feet): 300 Feet Assistive device: Rolling walker (2 wheels) Gait Pattern/deviations: Step-through pattern;Decreased stride length;Trunk flexed Gait velocity: Decreased Gait velocity interpretation: <1.31 ft/sec, indicative of household ambulator   General Gait Details: VC's for improved posture, closer walker proximity, and forward gaze. No assist required and progressed to gross supervision by end of session.  Stairs Stairs: Yes Stairs assistance: Min guard Stair Management: Two rails;Step to pattern;Forwards Number of Stairs: 10 General stair comments: VC's for sequencing and general safety. No assist required.  Wheelchair Mobility    Modified Rankin (Stroke Patients Only)       Balance Overall balance assessment: Mild deficits observed, not formally tested                                           Pertinent Vitals/Pain Pain Assessment: No/denies pain    Home Living Family/patient expects to be discharged to:: Private residence Living Arrangements: Other (Comment) (brother-in-law will be living with pt,able to assist 24/7 at d/c) Available Help at Discharge: Family Type of Home: House Home Access: Stairs to enter Entrance Stairs-Rails: Can reach both Entrance Stairs-Number of Steps: 3-4   Home Layout: One level Home Equipment: Kasandra Knudsen - single point      Prior Function Prior  Level of Function : Independent/Modified Independent;Working/employed             Mobility Comments: uses SPC at baseline 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    Cervical / Trunk Assessment Cervical / Trunk Assessment: Neck Surgery  Communication   Communication: No difficulties  Cognition Arousal/Alertness: Awake/alert Behavior During Therapy: WFL for tasks assessed/performed Overall Cognitive Status: Within Functional Limits for tasks assessed                                          General Comments General comments (skin integrity, edema, etc.): brother-in-law present during session    Exercises     Assessment/Plan    PT Assessment Patient needs continued PT services  PT Problem List Decreased strength;Decreased range of motion;Decreased activity tolerance;Decreased balance;Decreased mobility;Decreased knowledge of use of DME;Decreased safety awareness;Decreased knowledge of precautions;Pain       PT Treatment Interventions DME instruction;Gait training;Stair training;Functional mobility training;Therapeutic activities;Therapeutic exercise;Neuromuscular re-education;Patient/family education    PT Goals (Current goals can be found in the Care Plan section)  Acute Rehab PT Goals Patient Stated Goal: Home today PT Goal Formulation: With patient/family Time For Goal Achievement: 06/11/21 Potential to Achieve Goals: Good    Frequency Min 5X/week   Barriers to discharge        Co-evaluation               AM-PAC PT "6 Clicks" Mobility  Outcome Measure Help needed turning from your back to your side while in a flat bed without using bedrails?: None Help needed moving from lying on your back to sitting on the side of a flat bed without using bedrails?: A Little Help needed moving to and from a bed to a chair (including a  wheelchair)?: A Little Help needed standing up from a chair using your arms (e.g., wheelchair or bedside chair)?: A Little Help needed to walk in hospital room?: A Little Help needed climbing 3-5 steps with a railing? : A Little 6 Click Score: 19    End of Session Equipment Utilized During Treatment: Gait belt Activity Tolerance: Patient tolerated treatment well Patient left: in bed Nurse Communication: Mobility status PT Visit Diagnosis: Unsteadiness on feet (R26.81);Pain Pain - part of body:  (back)    Time: 9518-8416 PT Time Calculation (min) (ACUTE ONLY): 22 min   Charges:   PT Evaluation $PT Eval Low Complexity: 1 Low          Rolinda Roan, PT, DPT Acute Rehabilitation Services Pager: [phone] Office: [phone]   Thelma Comp 06/04/2021, 11:45 AM

## 2021-06-04 NOTE — Evaluation (Signed)
Occupational Therapy Evaluation Patient Details Name: Andrew Arnold MRN: [id number] DOB: 03/19/1956 Today's Date: 06/04/2021   History of Present Illness Pt is 65 y.o. 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 PMH includes L/R heart cath and coronary angiography (05/2021).   Clinical Impression   Pt is independent at baseline for ADLs, uses [facility] for mobility. Pt's family member present during session, will be available for assistance 24/7 at d/c. Pt supervision for bed mobility, v/cing for log rolling technique, min A for initial stand attempt, overall min guard for transfers and ambulation with RW. Pt min guard - min A for ADLs, educated pt on cervical precautions with handout, UB/LB compensatory dressing strategies, pt verbalizes and demonstrates understanding. Demonstrated tub transfer with BSC as shower seat, pt verbalizes understanding but prefers to bathe sinkside at d/c until strength/balance improves. Pt limited by impaired balance, activity tolerance, strength, and coordination at this time, however has no acute OT needs. Will s/o. Recommend d/c home with assistance.     Recommendations for follow up therapy are one component of a multi-disciplinary discharge planning process, led by the attending physician.  Recommendations may be updated based on patient status, additional functional criteria and insurance authorization.   Follow Up Recommendations  No OT follow up    Assistance Recommended at Discharge Intermittent Supervision/Assistance  Functional Status Assessment  Patient has had a recent decline in their functional status and demonstrates the ability to make significant improvements in function in a reasonable and predictable amount of time.  Equipment Recommendations  BSC/3in1    Recommendations for Other Services PT consult     Precautions / Restrictions Precautions Precautions: Cervical Precaution Booklet Issued: Yes (comment) Required  Braces or Orthoses: Other Brace Other Brace: soft collar Restrictions Weight Bearing Restrictions: No      Mobility Bed Mobility Overal bed mobility: Needs Assistance Bed Mobility: Sidelying to Sit;Sit to Sidelying   Sidelying to sit: Supervision       General bed mobility comments: requried v/cing for log rolling technique    Transfers Overall transfer level: Needs assistance Equipment used: Rolling walker (2 wheels) Transfers: Sit to/from Stand Sit to Stand: Min assist           General transfer comment: increased time to stand and steady self before walking      Balance Overall balance assessment: Mild deficits observed, not formally tested                                         ADL either performed or assessed with clinical judgement   ADL Overall ADL's : Needs assistance/impaired Eating/Feeding: Independent;Sitting   Grooming: Set up;Sitting   Upper Body Bathing: Set up;Sitting   Lower Body Bathing: Minimal assistance;Sitting/lateral leans   Upper Body Dressing : Set up;Sitting;Adhering to UE precautions   Lower Body Dressing: Minimal assistance;Sitting/lateral leans   Toilet Transfer: Min guard;Rolling walker (2 wheels);Regular Toilet;Ambulation   Toileting- Clothing Manipulation and Hygiene: Min guard;Sitting/lateral lean   Tub/ Shower Transfer: Minimal assistance;Shower seat;BSC/3in1;Rolling walker (2 wheels)   Functional mobility during ADLs: Min guard;Rolling walker (2 wheels);Cueing for safety General ADL Comments: Educated pt on compensatory strategies for UB/LB dressing, tub transfer with BSC, as well as donning/doffing collar.     Vision   Vision Assessment?: No apparent visual deficits     Perception     Praxis  Pertinent Vitals/Pain Pain Assessment: No/denies pain     Hand Dominance     Extremity/Trunk Assessment Upper Extremity Assessment Upper Extremity Assessment: Overall WFL for tasks assessed    Lower Extremity Assessment Lower Extremity Assessment: Defer to PT evaluation   Cervical / Trunk Assessment Cervical / Trunk Assessment: Neck Surgery   Communication Communication Communication: No difficulties   Cognition Arousal/Alertness: Awake/alert Behavior During Therapy: WFL for tasks assessed/performed Overall Cognitive Status: Within Functional Limits for tasks assessed                                       General Comments  brother-in-law present during session    Exercises     Shoulder Instructions      Home Living Family/patient expects to be discharged to:: Private residence Living Arrangements: Other (Comment) (brother-in-law will be living with pt,able to assist 24/7 at d/c) Available Help at Discharge: Family Type of Home: House Home Access: Stairs to enter [employer] of Steps: 3-4 Entrance Stairs-Rails: Can reach both Home Layout: One level     Bathroom Shower/Tub: Tub/shower unit;Other (comment);Sponge bathes at baseline (pt plans to sponge bathe sinkside until strength regained)   Bathroom Toilet: Standard Bathroom Accessibility: No   Home Equipment: Cane - single point          Prior Functioning/Environment Prior Level of Function : Independent/Modified Independent;Working/employed             Mobility Comments: uses [facility] at baseline 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 [place]        OT Problem List: Decreased strength;Decreased range of motion;Decreased activity tolerance;Impaired balance (sitting and/or standing);Decreased coordination      OT Treatment/Interventions:      OT Goals(Current goals can be found in the care plan section) Acute Rehab OT Goals Patient Stated Goal: return PLOF OT Goal Formulation: With patient Time For Goal Achievement: 06/19/21 Potential to Achieve Goals: Good  OT Frequency:     Barriers to D/C:            Co-evaluation              AM-PAC OT "6 Clicks"  Daily Activity     Outcome Measure Help from another person eating meals?: None Help from another person taking care of personal grooming?: None Help from another person toileting, which includes using toliet, bedpan, or urinal?: A Little Help from another person bathing (including washing, rinsing, drying)?: A Little Help from another person to put on and taking off regular upper body clothing?: None Help from another person to put on and taking off regular lower body clothing?: A Little 6 Click Score: 21   End of Session Equipment Utilized During Treatment: Gait belt;Rolling walker (2 wheels) Nurse Communication: Mobility status  Activity Tolerance: Patient tolerated treatment well Patient left: in bed;with call bell/phone within reach;with family/visitor present (sitting EOB eating breakfast)  OT Visit Diagnosis: Unsteadiness on feet (R26.81);Other abnormalities of gait and mobility (R26.89);Muscle weakness (generalized) (M62.81);Pain                Time: 3710-6269 OT Time Calculation (min): 30 min Charges:  OT General Charges $OT Visit: 1 Visit OT Evaluation $OT Eval Low Complexity: 1 Low OT Treatments $Self Care/Home Management : 8-22 mins  Lynnda Child, OTD, OTR/L Acute Rehab [phone]) [phone] - [place] 06/04/2021, 8:28 AM

## 2021-06-05 NOTE — Anesthesia Postprocedure Evaluation (Signed)
Anesthesia Post Note  Patient: Andrew Arnold  Procedure(s) Performed: Anterior Cervical Decompression Fusion  Cervical three-four     Patient location during evaluation: PACU Anesthesia Type: General Level of consciousness: sedated Pain management: pain level controlled Vital Signs Assessment: post-procedure vital signs reviewed and stable Respiratory status: spontaneous breathing and respiratory function stable Cardiovascular status: stable Postop Assessment: no apparent nausea or vomiting Anesthetic complications: no   No notable events documented.  Last Vitals:  Vitals:   06/04/21 0404 06/04/21 0842  BP: 127/78 (!) 142/76  Pulse: (!) 56 68  Resp: 18 18  Temp: 36.8 C 36.6 C  SpO2: 98% 100%    Last Pain:  Vitals:   06/04/21 0842  TempSrc: Oral  PainSc:                  [name]

## 2021-06-10 ENCOUNTER — Ambulatory Visit (HOSPITAL_COMMUNITY): Payer: BC Managed Care – PPO

## 2021-06-11 ENCOUNTER — Other Ambulatory Visit: Payer: Self-pay

## 2021-06-11 ENCOUNTER — Ambulatory Visit (HOSPITAL_COMMUNITY)
Admission: RE | Admit: 2021-06-11 | Discharge: 2021-06-11 | Disposition: A | Discharge status: Home / Self Care | Payer: BC Managed Care – PPO | Source: Ambulatory Visit | Attending: Internal Medicine | Admitting: Internal Medicine

## 2021-06-11 ENCOUNTER — Encounter (HOSPITAL_COMMUNITY): Payer: Self-pay | Admitting: Internal Medicine

## 2021-06-11 ENCOUNTER — Other Ambulatory Visit (HOSPITAL_COMMUNITY): Payer: Self-pay

## 2021-06-11 VITALS — BP 112/68

## 2021-06-11 DIAGNOSIS — Z79899 Other long term (current) drug therapy: Secondary | ICD-10-CM | POA: Diagnosis not present

## 2021-06-11 DIAGNOSIS — E119 Type 2 diabetes mellitus without complications: Secondary | ICD-10-CM | POA: Diagnosis not present

## 2021-06-11 DIAGNOSIS — I10 Essential (primary) hypertension: Secondary | ICD-10-CM

## 2021-06-11 DIAGNOSIS — I110 Hypertensive heart disease with heart failure: Secondary | ICD-10-CM | POA: Insufficient documentation

## 2021-06-11 DIAGNOSIS — R600 Localized edema: Secondary | ICD-10-CM | POA: Diagnosis not present

## 2021-06-11 DIAGNOSIS — R55 Syncope and collapse: Secondary | ICD-10-CM | POA: Insufficient documentation

## 2021-06-11 DIAGNOSIS — E1100 Type 2 diabetes mellitus with hyperosmolarity without nonketotic hyperglycemic-hyperosmolar coma (NKHHC): Secondary | ICD-10-CM | POA: Diagnosis not present

## 2021-06-11 DIAGNOSIS — E785 Hyperlipidemia, unspecified: Secondary | ICD-10-CM | POA: Insufficient documentation

## 2021-06-11 DIAGNOSIS — R531 Weakness: Secondary | ICD-10-CM | POA: Insufficient documentation

## 2021-06-11 DIAGNOSIS — M4802 Spinal stenosis, cervical region: Secondary | ICD-10-CM | POA: Insufficient documentation

## 2021-06-11 DIAGNOSIS — I2510 Atherosclerotic heart disease of native coronary artery without angina pectoris: Secondary | ICD-10-CM | POA: Insufficient documentation

## 2021-06-11 DIAGNOSIS — I5032 Chronic diastolic (congestive) heart failure: Secondary | ICD-10-CM | POA: Diagnosis not present

## 2021-06-11 DIAGNOSIS — Z7984 Long term (current) use of oral hypoglycemic drugs: Secondary | ICD-10-CM | POA: Insufficient documentation

## 2021-06-11 LAB — BASIC METABOLIC PANEL
Anion gap: 12 (ref 5–15)
BUN: 23 mg/dL (ref 8–23)
CO2: 28 mmol/L (ref 22–32)
Calcium: 8.7 mg/dL — ABNORMAL LOW (ref 8.9–10.3)
Chloride: 96 mmol/L — ABNORMAL LOW (ref 98–111)
Creatinine, Ser: 1.04 mg/dL (ref 0.61–1.24)
GFR, Estimated: >60 mL/min (ref >60)
Glucose, Bld: 113 mg/dL — ABNORMAL HIGH (ref 70–99)
Potassium: 3.2 mmol/L — ABNORMAL LOW (ref 3.5–5.1)
Sodium: 136 mmol/L (ref 135–145)

## 2021-06-11 LAB — BRAIN NATRIURETIC PEPTIDE: B Natriuretic Peptide: 61.6 pg/mL (ref 0.0–100.0)

## 2021-06-11 MED ORDER — ENTRESTO 49-51 MG PO TABS: 1.0000 | ORAL_TABLET | Freq: Two times a day (BID) | ORAL | 6 refills | Status: DC

## 2021-06-11 NOTE — Patient Instructions (Signed)
Medication Changes:  Stop Diltiazem  Start Entresto 49/51 mg Twice daily   Lab Work:  Labs done today, your results will be available in MyChart, we will contact you for abnormal readings.   Testing/Procedures:  None  Referrals:  None  Special Instructions//Education:  Please wear your compression hose daily, place them on as soon as you get up in the morning and remove before you go to bed at night.   Follow-Up in: 3 months  At the [facility], you and your health needs are our priority. We have a designated team specialized in the treatment of Heart Failure. This Care Team includes your primary Heart Failure Specialized Cardiologist (physician), Advanced Practice Providers (APPs- Physician Assistants and Nurse Practitioners), and Pharmacist who all work together to provide you with the care you need, when you need it.   You may see any of the following providers on your designated Care Team at your next follow up: Dr Glori Bickers Dr Haynes Kerns, NP Lyda Jester, [place] [facility] [place], [place] Audry Riles, PharmD   Please be sure to bring in all your medications bottles to every appointment.   Need to Contact us:  If you have any questions or concerns before your next appointment please send [place] a message through [place] or call our office at [phone].    TO LEAVE A MESSAGE FOR THE NURSE SELECT OPTION 2, PLEASE LEAVE A MESSAGE INCLUDING: YOUR NAME DATE OF BIRTH CALL BACK NUMBER REASON FOR CALL**this is important as we prioritize the call backs  YOU WILL RECEIVE A CALL BACK THE SAME DAY AS LONG AS YOU CALL BEFORE 4:00 PM

## 2021-06-11 NOTE — Progress Notes (Signed)
ADVANCED HF CLINIC NOTE  Referring Physician: Dr Karie Kirks  Primary Care: Dr Karie Kirks  Primary Cardiologist: None  Neuro Surgeon: Dr Marcello Moores   HPI: Mr Levitt is a 65 year old with history of syncope, HTN, DM2, HL and diastolic HF  Followed by neuro urgery for RUE/LUE weakness.. CT with cervical spine stenosis  On 05/15/2021 presented ED with syncope. Standing in front of the refrigerator then woke up on the floor.  EKG SR 64 bpm. ECHO EF 65-70%, LVH, grade II DD, and normal RV. Carotids and lower extremity dopplers normal.  Seen in [facility] with progessive LE edema and SOB. Meds adjusted and referred for cath as part of pre-op clearance. Cath 05/23/21 as below non-obstructive CAD and normal RHC  He underwent successful cervical decompression on 28/00/34 without complication.   Here for cardiac f/u. Still having some tinging in his legs. Denies CP or SOB but not that active yet. No orthopnea or PND. Still with some LE edema.    Cath 05/23/21     Prox RCA lesion is 20% stenosed.   Prox Cx lesion is 30% stenosed.   Prox LAD to Mid LAD lesion is 30% stenosed.   Dist LAD-1 lesion is 50% stenosed.   Dist LAD-2 lesion is 70% stenosed.   The left ventricular ejection fraction is greater than 65% by visual estimate.   Findings:   Ao  = 132/81 (99) LV = 147/10 RA = 2 RV = 26/6 PA = 24/1 (14) PCW = 6 Fick cardiac output/index = 8.0/3.5 PVR = 1.0 WU Ao sat = 99% PA sat = 72%, 73% SVC sat = 76%   Assessment: 1. Mild to moderate non-obstructive CAD 2. EF 65-70% 3. Normal filling pressures 4. High cardiac output with no evidence of intracardiac shunting   Past Medical History:  Diagnosis Date   Allergic rhinitis due to pollen    on allergy shots   Arrhythmia    1980s hospitalized for HTN and ?irregular rhythm but no specific dx but resolved during hospitalization   Diabetes mellitus without complication (HCC)    HTN (hypertension)    Hyperlipidemia     Current  Outpatient Medications  Medication Sig Dispense Refill   ascorbic acid (VITAMIN C) 500 MG tablet Take 500 mg by mouth every morning.      atorvastatin (LIPITOR) 80 MG tablet Take 80 mg by mouth at bedtime.     B Complex Vitamins (B COMPLEX 100 PO) Take 1 tablet by mouth every morning.      cetirizine (ZYRTEC) 10 MG tablet Take 10 mg by mouth in the morning.     cloNIDine (CATAPRES) 0.2 MG tablet Take 0.2 mg by mouth 3 (three) times daily.     diltiazem (CARDIZEM CD) 360 MG 24 hr capsule Take 360 mg by mouth daily.     docusate sodium (COLACE) 100 MG capsule Take 1 capsule (100 mg total) by mouth 2 (two) times daily. (Patient taking differently: Take 100 mg by mouth 2 (two) times daily. As needed) 60 capsule 2   furosemide (LASIX) 20 MG tablet Take 1 tablet (20 mg total) by mouth daily. (Patient taking differently: Take 20 mg by mouth every evening.) 30 tablet 3   hydrochlorothiazide (HYDRODIURIL) 25 MG tablet Take 25 mg by mouth every evening.     metFORMIN (GLUCOPHAGE) 1000 MG tablet Take 1,000 mg by mouth 2 (two) times daily.     oxyCODONE-acetaminophen (PERCOCET) 5-325 MG tablet Take 1 tablet by mouth every 4 (four)  hours as needed for severe pain. (Patient taking differently: Take 1 tablet by mouth every 4 (four) hours as needed for severe pain. As needed) 40 tablet 0   potassium chloride (K-DUR) 10 MEQ tablet Take 1 tablet by mouth daily in the afternoon.  1   tamsulosin (FLOMAX) 0.4 MG CAPS capsule Take 1 capsule (0.4 mg total) by mouth daily. (Patient taking differently: Take 0.4 mg by mouth every evening.) 30 capsule 11   No current facility-administered medications for this encounter.    No Known Allergies    Social History   Socioeconomic History   Marital status: Single    Spouse name: Not on file   Number of children: Not on file   Years of education: Not on file   Highest education level: Not on file  Occupational History   Occupation: school counselor  Tobacco Use    Smoking status: Never   Smokeless tobacco: Never  Vaping Use   Vaping Use: Never used  Substance and Sexual Activity   Alcohol use: No   Drug use: No   Sexual activity: Yes  Other Topics Concern   Not on file  Social History Narrative   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[place], [place]   Lives in [place] Determinants of Health   Financial Resource Strain: Not on file  Food Insecurity: Not on file  Transportation Needs: Not on file  Physical Activity: Not on file  Stress: Not on file  Social Connections: Not on file  Intimate Partner Violence: Not on file      Family History  Problem Relation Age of Onset   Cancer Mother    Cirrhosis Father        cirrhosis of the liver; alcohol related   Diabetes Sister    Other Other        neice was on allergy vaccine when young   Colon cancer Neg Hx     Vitals:   06/11/21 1531  BP: 112/68  Pulse: (P) 93  SpO2: (P) 97%  Weight: (P) 110 kg (242 lb 6.4 oz)    PHYSICAL EXAM: General:  Sitting in chair No resp difficulty HEENT: normal Neck: soft collar in place. Unable to see JVP  Cor: PMI nondisplaced. Regular rate & rhythm. No rubs, gallops or murmurs. Lungs: clear Abdomen: soft, nontender, nondistended. No hepatosplenomegaly. No bruits or masses. Good bowel sounds. Extremities: no cyanosis, clubbing, rash, 1+ edema Neuro: alert & orientedx3, cranial nerves grossly intact. moves all 4 extremities w/o difficulty. Affect pleasant     ASSESSMENT & PLAN:  1. CAD - cath 11/22 with moderate non-obstructive CAD - - no s/s angina  - continue atorva - start ASA 81  2.Syncope  -Had syncopal episode 05/14/21 . Unclear etiology.  -Evaluated in the ED -Carotid Dopplers negative. Echo EF 65-70%  -Zio 11/22 No significant dysrhythmias  - No driving x 6 months  3. Chronic HFpEF/LE edema -05/15/21 Echo EF 65-70% RV normal - On lasix - Cath 11/22 normal filling pressures. May have component of venous insufficiency - Stop  diltiazem. Start Entresto 49/51 bid - Consider SGLT2i at next visit - Encouraged compression hose - Consider PYP in future  4. HTN  - BP well controlled but given LE edema will switch diltiazem to Entresto - would eventually like to transition off clonidine to more heart-friendly meds  5.  Hyperlipidemia  - On Statin   6. DMII - On metformin - Considr  SGLT2i next visit.   Glori Bickers, MD  11:24 PM

## 2021-06-12 NOTE — Addendum Note (Signed)
Encounter addended by: Micki Riley, RN on: 06/12/2021 2:39 PM  Actions taken: Imaging Exam ended

## 2021-06-17 ENCOUNTER — Telehealth (HOSPITAL_COMMUNITY): Payer: Self-pay

## 2021-06-17 DIAGNOSIS — I5032 Chronic diastolic (congestive) heart failure: Secondary | ICD-10-CM

## 2021-06-17 NOTE — Telephone Encounter (Signed)
-----   Message from Jolaine Artist, MD sent at 06/16/2021 12:07 AM EST ----- Start kdur 20 daily.  Take 3 tabs the first day only. Recheck BMET in 2 weeks.

## 2021-06-17 NOTE — Telephone Encounter (Signed)
Pt aware, agreeable, and verbalized understanding Labs ordered for 12/19@10am 

## 2021-06-18 ENCOUNTER — Ambulatory Visit (HOSPITAL_COMMUNITY): Payer: BC Managed Care – PPO | Admitting: Physical Therapy

## 2021-06-30 ENCOUNTER — Other Ambulatory Visit: Payer: Self-pay

## 2021-06-30 ENCOUNTER — Ambulatory Visit (HOSPITAL_COMMUNITY)
Admission: RE | Admit: 2021-06-30 | Discharge: 2021-06-30 | Disposition: A | Discharge status: Home / Self Care | Payer: BC Managed Care – PPO | Source: Ambulatory Visit | Attending: Cardiology | Admitting: Cardiology

## 2021-06-30 DIAGNOSIS — I5032 Chronic diastolic (congestive) heart failure: Secondary | ICD-10-CM | POA: Diagnosis not present

## 2021-06-30 LAB — BASIC METABOLIC PANEL
Anion gap: 10 (ref 5–15)
BUN: 12 mg/dL (ref 8–23)
CO2: 29 mmol/L (ref 22–32)
Calcium: 9.3 mg/dL (ref 8.9–10.3)
Chloride: 99 mmol/L (ref 98–111)
Creatinine, Ser: 1.33 mg/dL — ABNORMAL HIGH (ref 0.61–1.24)
GFR, Estimated: 59 mL/min — ABNORMAL LOW (ref >60)
Glucose, Bld: 129 mg/dL — ABNORMAL HIGH (ref 70–99)
Potassium: 4 mmol/L (ref 3.5–5.1)
Sodium: 138 mmol/L (ref 135–145)

## 2021-07-08 ENCOUNTER — Telehealth (HOSPITAL_COMMUNITY): Payer: Self-pay | Admitting: *Deleted

## 2021-07-08 MED ORDER — FUROSEMIDE 20 MG PO TABS: 20.0000 mg | ORAL_TABLET | Freq: Every day | ORAL | 3 refills | Status: DC | PRN

## 2021-07-08 NOTE — Telephone Encounter (Signed)
Pt called concerned that since starting the [facility] he has had increasing issue with dry mouth that continues to get worse. He states wt seems to be dropping, though he doesn't weigh daily, edema has improved with compression hose.  Per Allena Katz, NP pt will stop Lasix 20 mg Daily and take only as needed  Pt aware, agreeable, and verbalized understanding. If symptoms continue he will call us back.

## 2021-07-22 DIAGNOSIS — I5030 Unspecified diastolic (congestive) heart failure: Secondary | ICD-10-CM | POA: Diagnosis not present

## 2021-07-22 DIAGNOSIS — I10 Essential (primary) hypertension: Secondary | ICD-10-CM | POA: Diagnosis not present

## 2021-07-22 DIAGNOSIS — E669 Obesity, unspecified: Secondary | ICD-10-CM | POA: Diagnosis not present

## 2021-07-22 DIAGNOSIS — E1165 Type 2 diabetes mellitus with hyperglycemia: Secondary | ICD-10-CM | POA: Diagnosis not present

## 2021-07-23 DIAGNOSIS — M25551 Pain in right hip: Secondary | ICD-10-CM | POA: Diagnosis not present

## 2021-07-23 DIAGNOSIS — M4712 Other spondylosis with myelopathy, cervical region: Secondary | ICD-10-CM | POA: Diagnosis not present

## 2021-07-30 ENCOUNTER — Other Ambulatory Visit: Payer: Self-pay

## 2021-07-30 ENCOUNTER — Emergency Department (HOSPITAL_COMMUNITY): Payer: BC Managed Care – PPO

## 2021-07-30 ENCOUNTER — Emergency Department (HOSPITAL_COMMUNITY)
Admission: EM | Admit: 2021-07-30 | Discharge: 2021-07-30 | Disposition: A | Discharge status: Home / Self Care | Payer: BC Managed Care – PPO | Source: Home / Self Care | Attending: Emergency Medicine | Admitting: Emergency Medicine

## 2021-07-30 DIAGNOSIS — J181 Lobar pneumonia, unspecified organism: Secondary | ICD-10-CM | POA: Insufficient documentation

## 2021-07-30 DIAGNOSIS — N200 Calculus of kidney: Secondary | ICD-10-CM | POA: Diagnosis not present

## 2021-07-30 DIAGNOSIS — J189 Pneumonia, unspecified organism: Secondary | ICD-10-CM

## 2021-07-30 DIAGNOSIS — I951 Orthostatic hypotension: Secondary | ICD-10-CM | POA: Diagnosis not present

## 2021-07-30 DIAGNOSIS — R918 Other nonspecific abnormal finding of lung field: Secondary | ICD-10-CM | POA: Diagnosis not present

## 2021-07-30 DIAGNOSIS — E785 Hyperlipidemia, unspecified: Secondary | ICD-10-CM | POA: Diagnosis not present

## 2021-07-30 DIAGNOSIS — R0602 Shortness of breath: Secondary | ICD-10-CM | POA: Diagnosis not present

## 2021-07-30 DIAGNOSIS — R0789 Other chest pain: Secondary | ICD-10-CM | POA: Diagnosis not present

## 2021-07-30 DIAGNOSIS — E876 Hypokalemia: Secondary | ICD-10-CM | POA: Diagnosis not present

## 2021-07-30 DIAGNOSIS — Z981 Arthrodesis status: Secondary | ICD-10-CM | POA: Diagnosis not present

## 2021-07-30 DIAGNOSIS — J301 Allergic rhinitis due to pollen: Secondary | ICD-10-CM | POA: Diagnosis not present

## 2021-07-30 DIAGNOSIS — N179 Acute kidney failure, unspecified: Secondary | ICD-10-CM | POA: Diagnosis not present

## 2021-07-30 DIAGNOSIS — R109 Unspecified abdominal pain: Secondary | ICD-10-CM | POA: Diagnosis not present

## 2021-07-30 DIAGNOSIS — Z20822 Contact with and (suspected) exposure to covid-19: Secondary | ICD-10-CM | POA: Diagnosis not present

## 2021-07-30 DIAGNOSIS — Z79899 Other long term (current) drug therapy: Secondary | ICD-10-CM | POA: Insufficient documentation

## 2021-07-30 DIAGNOSIS — E119 Type 2 diabetes mellitus without complications: Secondary | ICD-10-CM | POA: Diagnosis not present

## 2021-07-30 DIAGNOSIS — K529 Noninfective gastroenteritis and colitis, unspecified: Secondary | ICD-10-CM | POA: Diagnosis not present

## 2021-07-30 DIAGNOSIS — E860 Dehydration: Secondary | ICD-10-CM | POA: Diagnosis not present

## 2021-07-30 DIAGNOSIS — I447 Left bundle-branch block, unspecified: Secondary | ICD-10-CM | POA: Diagnosis not present

## 2021-07-30 DIAGNOSIS — I517 Cardiomegaly: Secondary | ICD-10-CM | POA: Diagnosis not present

## 2021-07-30 DIAGNOSIS — N400 Enlarged prostate without lower urinary tract symptoms: Secondary | ICD-10-CM | POA: Diagnosis not present

## 2021-07-30 DIAGNOSIS — I10 Essential (primary) hypertension: Secondary | ICD-10-CM | POA: Diagnosis not present

## 2021-07-30 DIAGNOSIS — Z7984 Long term (current) use of oral hypoglycemic drugs: Secondary | ICD-10-CM | POA: Insufficient documentation

## 2021-07-30 DIAGNOSIS — I700 Atherosclerosis of aorta: Secondary | ICD-10-CM | POA: Diagnosis not present

## 2021-07-30 DIAGNOSIS — E1169 Type 2 diabetes mellitus with other specified complication: Secondary | ICD-10-CM | POA: Insufficient documentation

## 2021-07-30 DIAGNOSIS — R079 Chest pain, unspecified: Secondary | ICD-10-CM | POA: Diagnosis not present

## 2021-07-30 DIAGNOSIS — R55 Syncope and collapse: Secondary | ICD-10-CM | POA: Diagnosis not present

## 2021-07-30 DIAGNOSIS — K449 Diaphragmatic hernia without obstruction or gangrene: Secondary | ICD-10-CM | POA: Diagnosis not present

## 2021-07-30 DIAGNOSIS — Z833 Family history of diabetes mellitus: Secondary | ICD-10-CM | POA: Diagnosis not present

## 2021-07-30 DIAGNOSIS — R112 Nausea with vomiting, unspecified: Secondary | ICD-10-CM | POA: Diagnosis not present

## 2021-07-30 DIAGNOSIS — I2510 Atherosclerotic heart disease of native coronary artery without angina pectoris: Secondary | ICD-10-CM | POA: Diagnosis not present

## 2021-07-30 LAB — HEPATIC FUNCTION PANEL
ALT: 12 U/L (ref 0–44)
AST: 14 U/L — ABNORMAL LOW (ref 15–41)
Albumin: 3.6 g/dL (ref 3.5–5.0)
Alkaline Phosphatase: 86 U/L (ref 38–126)
Bilirubin, Direct: 0.2 mg/dL (ref 0.0–0.2)
Indirect Bilirubin: 1.3 mg/dL — ABNORMAL HIGH (ref 0.3–0.9)
Total Bilirubin: 1.5 mg/dL — ABNORMAL HIGH (ref 0.3–1.2)
Total Protein: 7.2 g/dL (ref 6.5–8.1)

## 2021-07-30 LAB — BASIC METABOLIC PANEL
Anion gap: 18 — ABNORMAL HIGH (ref 5–15)
BUN: 8 mg/dL (ref 8–23)
CO2: 21 mmol/L — ABNORMAL LOW (ref 22–32)
Calcium: 9.6 mg/dL (ref 8.9–10.3)
Chloride: 99 mmol/L (ref 98–111)
Creatinine, Ser: 1.21 mg/dL (ref 0.61–1.24)
GFR, Estimated: >60 mL/min (ref >60)
Glucose, Bld: 88 mg/dL (ref 70–99)
Potassium: 3.3 mmol/L — ABNORMAL LOW (ref 3.5–5.1)
Sodium: 138 mmol/L (ref 135–145)

## 2021-07-30 LAB — CBC
HCT: 36.6 % — ABNORMAL LOW (ref 39.0–52.0)
Hemoglobin: 11.8 g/dL — ABNORMAL LOW (ref 13.0–17.0)
MCH: 32.4 pg (ref 26.0–34.0)
MCHC: 32.2 g/dL (ref 30.0–36.0)
MCV: 100.5 fL — ABNORMAL HIGH (ref 80.0–100.0)
Platelets: 308 10*3/uL (ref 150–400)
RBC: 3.64 MIL/uL — ABNORMAL LOW (ref 4.22–5.81)
RDW: 13.2 % (ref 11.5–15.5)
WBC: 5.5 10*3/uL (ref 4.0–10.5)
nRBC: 0 % (ref 0.0–0.2)

## 2021-07-30 LAB — TROPONIN I (HIGH SENSITIVITY)
Troponin I (High Sensitivity): 18 ng/L — ABNORMAL HIGH (ref <18)
Troponin I (High Sensitivity): 18 ng/L — ABNORMAL HIGH (ref <18)

## 2021-07-30 LAB — BRAIN NATRIURETIC PEPTIDE: B Natriuretic Peptide: 46.1 pg/mL (ref 0.0–100.0)

## 2021-07-30 LAB — LIPASE, BLOOD: Lipase: 25 U/L (ref 11–51)

## 2021-07-30 IMAGING — CT CT ANGIO CHEST
2 of 6 series · 18 of 36 positions shown · IV contrast (agent unspecified)
Comparison: [DATE]

CLINICAL DATA: Chest pain

EXAM:
CT ANGIOGRAPHY CHEST WITH CONTRAST
TECHNIQUE: Multidetector CT imaging of the chest was performed using the
standard protocol during bolus administration of intravenous
contrast. Multiplanar CT image reconstructions and MIPs were
obtained to evaluate the vascular anatomy.

[Series 7: pe thins · axial · 0.69mm/px · z∈[+1329,+1594]mm · 17 of 422 slices shown]
[im 22/422  lung]
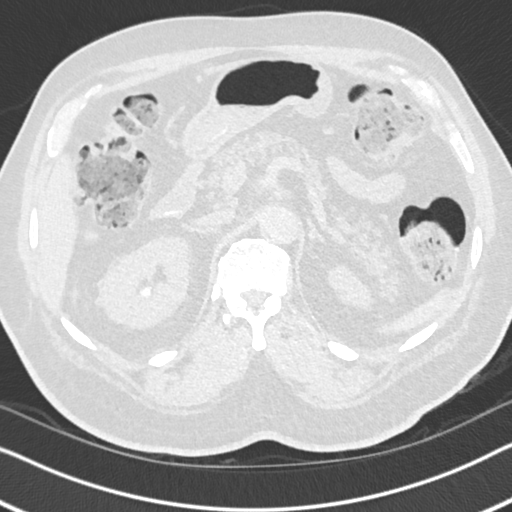
[im 43/422  mediastinal]
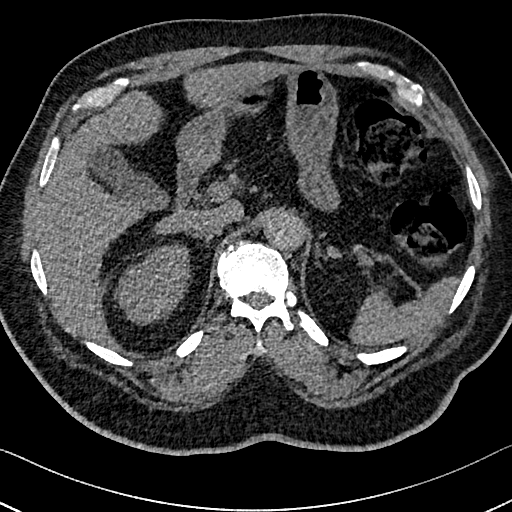
[im 64/422  lung]
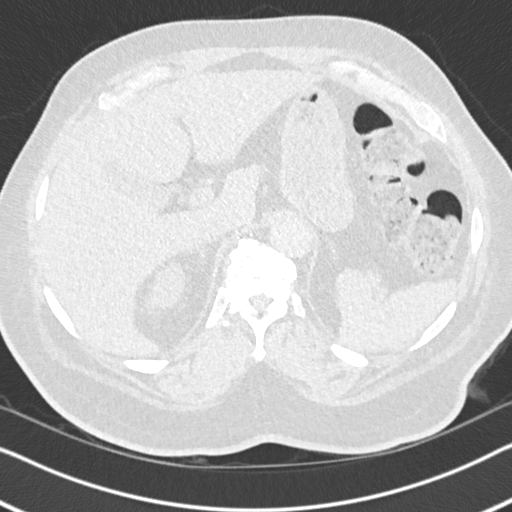
[im 85/422  mediastinal]
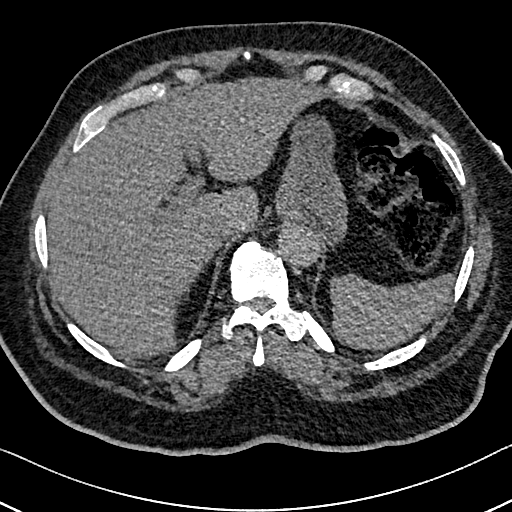
[im 127/422  lung]
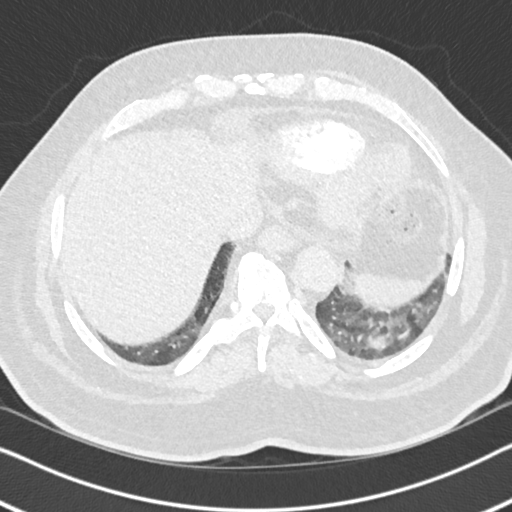
[im 148/422  mediastinal]
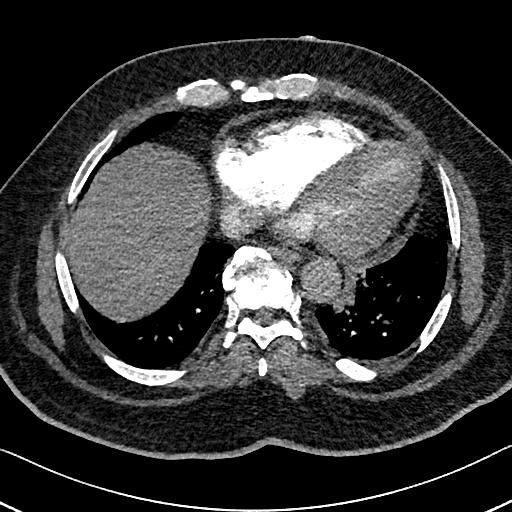
[im 169/422  lung]
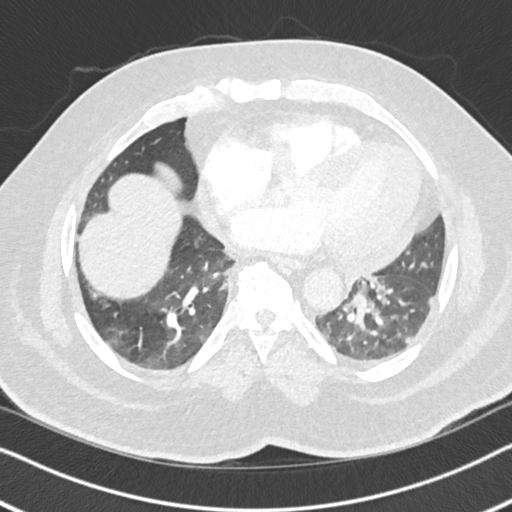
[im 190/422  mediastinal]
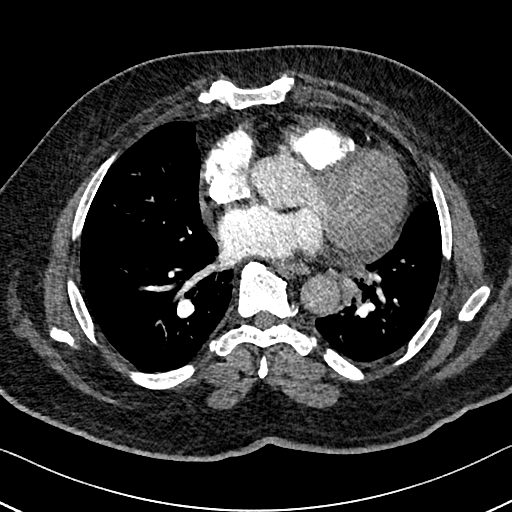
[im 211/422  lung]
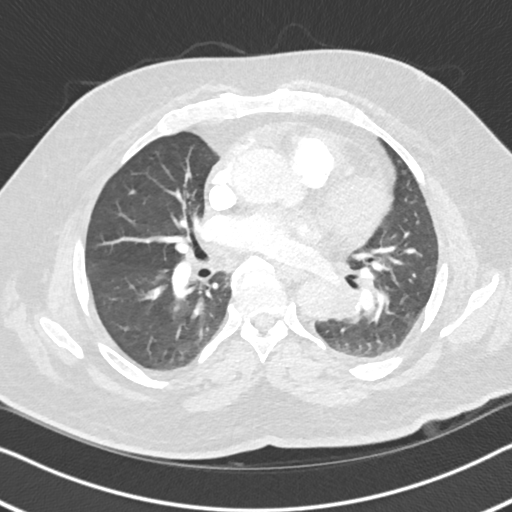
[im 232/422  mediastinal]
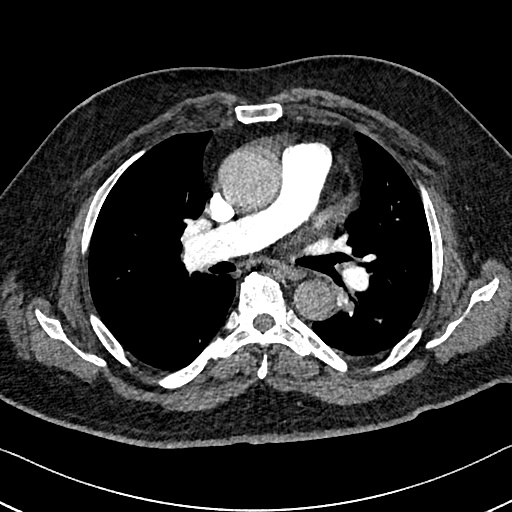
[im 253/422  lung]
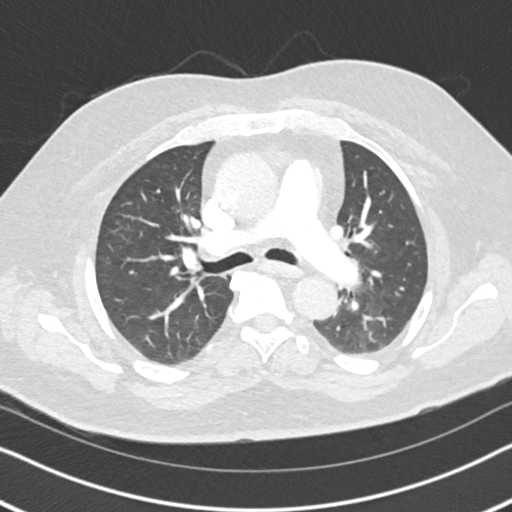
[im 274/422  mediastinal]
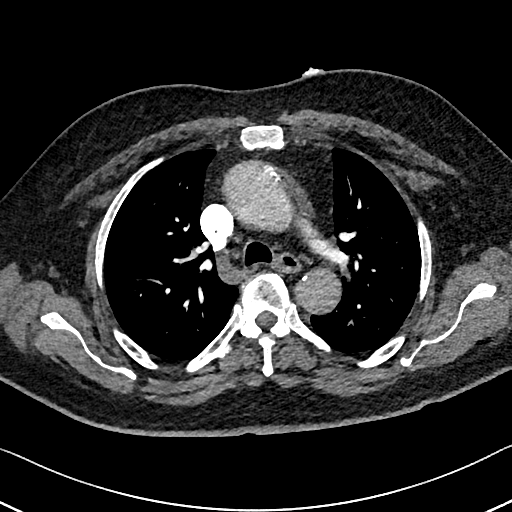
[im 295/422  lung]
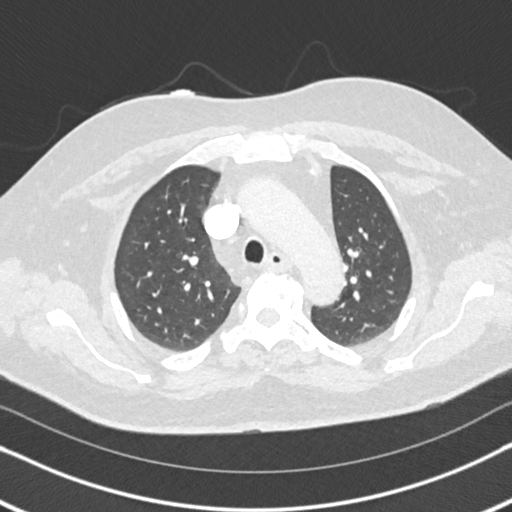
[im 337/422  mediastinal]
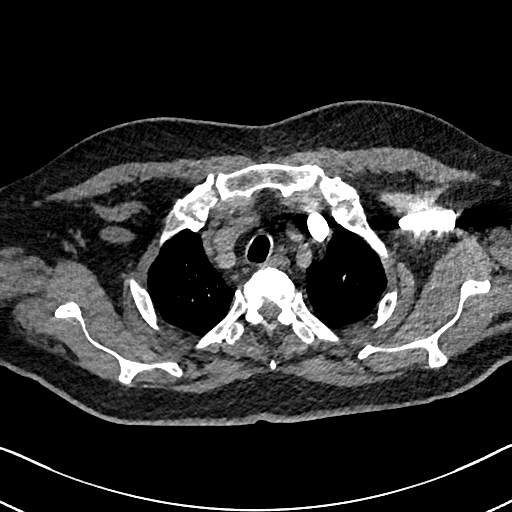
[im 358/422  lung]
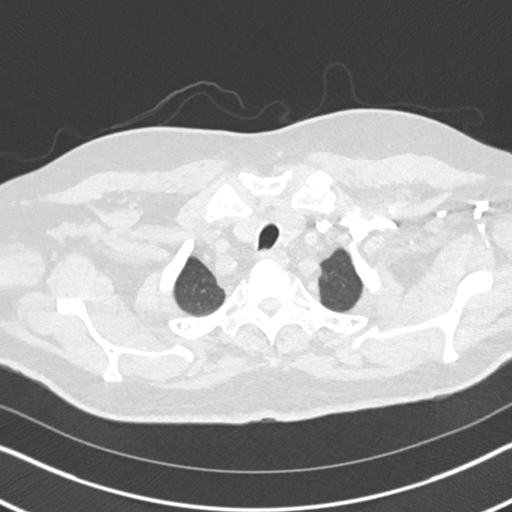
[im 379/422  mediastinal]
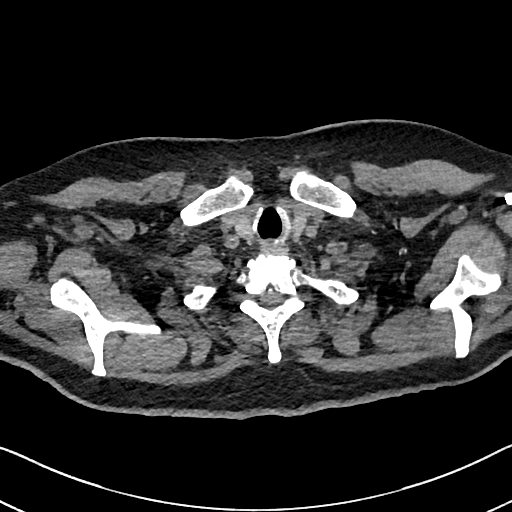
[im 400/422  lung]
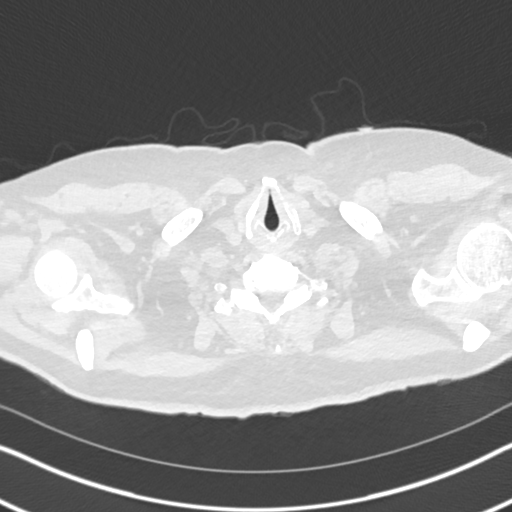

[Series 8: pe 2mm cor · coronal · 0.60mm/px · 1 of 151 slices shown]
[im 76/151  mediastinal]
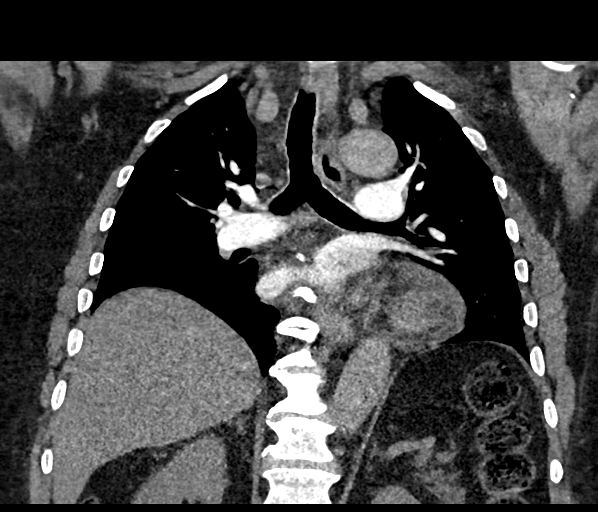

[18 of 36 positions shown; findings below may reference images not displayed]

RADIATION DOSE REDUCTION: This exam was performed according to the
departmental dose-optimization program which includes automated
exposure control, adjustment of the mA and/or kV according to
patient size and/or use of iterative reconstruction technique.

CONTRAST:  70mL OMNIPAQUE IOHEXOL 350 MG/ML SOLN
FINDINGS: Cardiovascular: Heart is enlarged in size. Scattered coronary artery
calcifications are seen. Contrast density in the thoracic aorta is
less than optimal. As far as seen, there is no definite demonstrable
intimal flap. There is ectasia of ascending thoracic aorta measuring
4.5 cm. There are no intraluminal filling defects in the pulmonary
artery branches.

Mediastinum/Nodes: No new significant lymphadenopathy seen.

Lungs/Pleura: There are multiple new nodular densities in the left
lower lobe largest measuring 2.5 cm in size. Many of these nodular
densities are in the periphery of left lower lobe. Rest of the lung
fields are clear. There is no significant pleural effusion or
pneumothorax.

Upper Abdomen: There is 7 mm calculus in the upper pole of right
kidney. Small hiatal hernia is seen.

Musculoskeletal: Degenerative changes are noted in the thoracic
spine and visualized lower cervical spine.

Review of the MIP images confirms the above findings.
IMPRESSION: There is no evidence of pulmonary artery embolism.

There are multiple new patchy nodular densities in the left lower
lobe measuring up to 2.5 cm in size suggesting pneumonia.
Possibility of 1 of these nodules being neoplastic process is not
excluded. Short-term follow-up CT in 2-3 weeks after medical
treatment for pneumonia should be considered.

There is ectasia of ascending thoracic aorta measuring 4.5 cm in
diameter.

There is 7 mm right renal stone.  Small hiatal hernia.

## 2021-07-30 MED ORDER — IOHEXOL 350 MG/ML SOLN
70.0000 mL | Freq: Once | INTRAVENOUS | Status: AC | PRN
  Administered 2021-07-30: 70 mL via INTRAVENOUS

## 2021-07-30 MED ORDER — AMOXICILLIN-POT CLAVULANATE 875-125 MG PO TABS: 1.0000 | ORAL_TABLET | Freq: Two times a day (BID) | ORAL | 0 refills | Status: DC

## 2021-07-30 MED ORDER — AZITHROMYCIN 250 MG PO TABS: 250.0000 mg | ORAL_TABLET | Freq: Every day | ORAL | 0 refills | Status: DC

## 2021-07-30 NOTE — Discharge Instructions (Signed)
Take antibiotics as prescribed.  Take the entire course, even if symptoms improve. Continue taking home medications as prescribed. Follow-up with your cardiologist for further evaluation and recheck of your chest pain. Follow-up with your primary care doctor for further evaluation of the abnormal CT and to ensure the haziness is cleared with antibiotics. Return to the emergency room if you develop difficulty breathing, severe worsening chest pain, you become sweaty/lightheaded, with any new, worsening, or concerning symptoms

## 2021-07-30 NOTE — ED Provider Notes (Signed)
[facility] EMERGENCY DEPARTMENT Provider Note   CSN: [id number] Arrival date & time: 07/30/21  1404     History  Chief Complaint  Patient presents with   Chest Pain    Andrew Arnold is a 66 y.o. male presenting for evaluation of chest pain.   Pt states around noon today he developed L sided cp. Pain is present with inspiration, no pain at rest.  No nausea or vomiting, diaphoresis.  No recent fevers, cough, nausea, vomiting, abdominal pain, urinary symptoms, no bowel movements.  No leg pain or swelling.  He is not on any blood thinners.  He did have a heart cath in November for cardiac clearance.  He had anterior approach cervical surgery, has been healing well from that.  HPI     Home Medications Prior to Admission medications   Medication Sig Start Date End Date Taking? Authorizing Provider  amoxicillin-clavulanate (AUGMENTIN) 875-125 MG tablet Take 1 tablet by mouth every 12 (twelve) hours. 07/30/21  Yes Juleon Narang, PA-C  azithromycin (ZITHROMAX) 250 MG tablet Take 1 tablet (250 mg total) by mouth daily. Take first 2 tablets together, then 1 every day until finished. 07/30/21  Yes Keyia Moretto, PA-C  ascorbic acid (VITAMIN C) 500 MG tablet Take 500 mg by mouth every morning.     [provider]  atorvastatin (LIPITOR) 80 MG tablet Take 80 mg by mouth at bedtime.    [provider]  B Complex Vitamins (B COMPLEX 100 PO) Take 1 tablet by mouth every morning.     [provider]  cetirizine (ZYRTEC) 10 MG tablet Take 10 mg by mouth in the morning.    [provider]  cloNIDine (CATAPRES) 0.2 MG tablet Take 0.2 mg by mouth 3 (three) times daily. 04/29/21   [provider]  docusate sodium (COLACE) 100 MG capsule Take 1 capsule (100 mg total) by mouth 2 (two) times daily. Patient taking differently: Take 100 mg by mouth 2 (two) times daily. As needed 06/04/21 06/04/22  Vallarie Mare, MD  furosemide  (LASIX) 20 MG tablet Take 1 tablet (20 mg total) by mouth daily as needed. 07/08/21   Milford, Maricela Bo, FNP  hydrochlorothiazide (HYDRODIURIL) 25 MG tablet Take 25 mg by mouth every evening.    [provider]  metFORMIN (GLUCOPHAGE) 1000 MG tablet Take 1,000 mg by mouth 2 (two) times daily. 03/21/21   [provider]  oxyCODONE-acetaminophen (PERCOCET) 5-325 MG tablet Take 1 tablet by mouth every 4 (four) hours as needed for severe pain. Patient taking differently: Take 1 tablet by mouth every 4 (four) hours as needed for severe pain. As needed 06/04/21 06/04/22  Vallarie Mare, MD  potassium chloride (K-DUR) 10 MEQ tablet Take 1 tablet by mouth daily in the afternoon. 08/12/17   [provider]  sacubitril-valsartan (ENTRESTO) 49-51 MG Take 1 tablet by mouth 2 (two) times daily. 06/11/21   Bensimhon, Shaune Pascal, MD  tamsulosin (FLOMAX) 0.4 MG CAPS capsule Take 1 capsule (0.4 mg total) by mouth daily. Patient taking differently: Take 0.4 mg by mouth every evening. 04/10/21   Irine Seal, MD      Allergies    Patient has no known allergies.    Review of Systems   Review of Systems  Cardiovascular:  Positive for chest pain.  All other systems reviewed and are negative.  Physical Exam Updated Vital Signs BP 134/80    Pulse 77    Temp 98.1 F (36.7 C) (Oral)  Resp 20    SpO2 100%  Physical Exam Vitals and nursing note reviewed.  Constitutional:      General: He is not in acute distress.    Appearance: Normal appearance.     Comments: Resting in the bed in NAD  HENT:     Head: Normocephalic and atraumatic.  Eyes:     Conjunctiva/sclera: Conjunctivae normal.     Pupils: Pupils are equal, round, and reactive to light.  Cardiovascular:     Rate and Rhythm: Normal rate and regular rhythm.     Pulses: Normal pulses.  Pulmonary:     Effort: Pulmonary effort is normal. No respiratory distress.     Breath sounds: Normal breath sounds. No wheezing.      Comments: Speaking in full sentences.  Clear lung sounds in all fields. Abdominal:     General: There is no distension.     Palpations: Abdomen is soft. There is no mass.     Tenderness: There is no abdominal tenderness. There is no guarding or rebound.  Musculoskeletal:        General: Normal range of motion.     Cervical back: Normal range of motion and neck supple.     Right lower leg: No edema.     Left lower leg: No edema.  Skin:    General: Skin is warm and dry.     Capillary Refill: Capillary refill takes less than 2 seconds.  Neurological:     Mental Status: He is alert and oriented to person, place, and time.  Psychiatric:        Mood and Affect: Mood and affect normal.        Speech: Speech normal.        Behavior: Behavior normal.    ED Results / Procedures / Treatments   Labs (all labs ordered are listed, but only abnormal results are displayed) Labs Reviewed  BASIC METABOLIC PANEL - Abnormal; Notable for the following components:      Result Value   Potassium 3.3 (*)    CO2 21 (*)    Anion gap 18 (*)    All other components within normal limits  CBC - Abnormal; Notable for the following components:   RBC 3.64 (*)    Hemoglobin 11.8 (*)    HCT 36.6 (*)    MCV 100.5 (*)    All other components within normal limits  HEPATIC FUNCTION PANEL - Abnormal; Notable for the following components:   AST 14 (*)    Total Bilirubin 1.5 (*)    Indirect Bilirubin 1.3 (*)    All other components within normal limits  TROPONIN I (HIGH SENSITIVITY) - Abnormal; Notable for the following components:   Troponin I (High Sensitivity) 18 (*)    All other components within normal limits  TROPONIN I (HIGH SENSITIVITY) - Abnormal; Notable for the following components:   Troponin I (High Sensitivity) 18 (*)    All other components within normal limits  BRAIN NATRIURETIC PEPTIDE  LIPASE, BLOOD    EKG EKG Interpretation  Date/Time:  Wednesday July 30 2021 14:14:44  EST Ventricular Rate:  92 PR Interval:  148 QRS Duration: 152 QT Interval:  410 QTC Calculation: 507 R Axis:   48 Text Interpretation: Normal sinus rhythm Left bundle branch block , new Abnormal ECG Confirmed by Carmin Muskrat [phone]) on 07/30/2021 2:24:41 PM  Radiology CT Angio Chest PE W and/or Wo Contrast  Result Date: 07/30/2021 CLINICAL DATA:  Chest pain EXAM: CT ANGIOGRAPHY  CHEST WITH CONTRAST TECHNIQUE: Multidetector CT imaging of the chest was performed using the standard protocol during bolus administration of intravenous contrast. Multiplanar CT image reconstructions and MIPs were obtained to evaluate the vascular anatomy. RADIATION DOSE REDUCTION: This exam was performed according to the departmental dose-optimization program which includes automated exposure control, adjustment of the mA and/or kV according to patient size and/or use of iterative reconstruction technique. CONTRAST:  51mL OMNIPAQUE IOHEXOL 350 MG/ML SOLN COMPARISON:  08/29/2017 FINDINGS: Cardiovascular: Heart is enlarged in size. Scattered coronary artery calcifications are seen. Contrast density in the thoracic aorta is less than optimal. As far as seen, there is no definite demonstrable intimal flap. 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 There are no intraluminal filling defects in the pulmonary artery branches. Mediastinum/Nodes: No new significant lymphadenopathy seen. Lungs/Pleura: There are multiple new nodular densities in the left lower lobe largest measuring 2.5 cm in size. Many of these nodular densities are in the periphery of left lower lobe. Rest of the lung fields are clear. There is no significant pleural effusion or pneumothorax. Upper Abdomen: There is 7 mm calculus in the upper pole of right kidney. Small hiatal hernia is seen. Musculoskeletal: Degenerative changes are noted in the thoracic spine and visualized lower cervical spine. Review of the MIP images confirms the above  findings. IMPRESSION: There is no evidence of pulmonary artery embolism. There are multiple new patchy nodular densities in the left lower lobe measuring up to 2.5 cm in size suggesting pneumonia. Possibility of 1 of these nodules being neoplastic process is not excluded. Short-term follow-up CT in 2-3 weeks after medical treatment for pneumonia should be considered. 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 There is 7 mm right renal stone.  Small hiatal hernia. Electronically Signed   By: Elmer Picker M.D.   On: 07/30/2021 16:39    Procedures Procedures    Medications Ordered in ED Medications  iohexol (OMNIPAQUE) 350 MG/ML injection 70 mL (70 mLs Intravenous Contrast Given 07/30/21 1620)    ED Course/ Medical Decision Making/ A&P                           Medical Decision Making Amount and/or Complexity of Data Reviewed Labs: ordered. Radiology: ordered.  Risk Prescription drug management.    This patient presents to the ED for concern of cp. This involves an extensive number of treatment options, and is a complaint that carries with it a high risk of complications and morbidity.  The differential diagnosis includes ACS, PE, infection, pneumothorax, MSK pain, GERD   Co morbidities:  CAD, HLD, DM, HTN, recent surgery not on thinners   Additional history: Reviewed recent cath report- moderate stenosis being managed medically.    Lab Tests:  I ordered, and personally interpreted labs.  The pertinent results include: Troponin 18x2.  This is similar to several weeks ago, and is not trending up.  No leukocytosis.  Hemoglobin at baseline.  Electrolytes stable.  BNP negative.  Lipase normal.   Imaging Studies:  I ordered imaging studies including CTA of the chest to rule out PE I independently visualized and interpreted imaging which showed no PE, does show haziness consistent with infection.  Cannot rule out neoplasm.  Cardiac  Monitoring:  The patient was maintained on a cardiac monitor.  I personally viewed and interpreted the cardiac monitored which showed an underlying rhythm of: nsr   Disposition:  After  consideration of the diagnostic results and the patients response to treatment, I feel that the patent would benefit from antibiotic treatment for possible pneumonia and close cardiology follow-up.  Considered admission due to patient's cardiac risk and recent cath, however as a cath was done for a preop clearance, and pain is very atypical and patient is well-appearing, do not feel he needs recath or hospitalization at this time.  There is a possible explanation for his symptoms with the finding of pneumonia, patient appears reliable to follow-up outpatient. Discussed findings and plan with pt and brother. Discussed importance of close f/u with cardiology. At this time, pt appear safe for d/c. Return precautions given. Pt states he understands and agrees to plan.   Final Clinical Impression(s) / ED Diagnoses Final diagnoses:  Community acquired pneumonia of left lower lobe of lung  Atypical chest pain    Rx / DC Orders ED Discharge Orders          Ordered    amoxicillin-clavulanate (AUGMENTIN) 875-125 MG tablet  Every 12 hours        07/30/21 1750    azithromycin (ZITHROMAX) 250 MG tablet  Daily        07/30/21 1750              Filipe Greathouse, PA-C 07/30/21 1753    Lennice Sites, DO 08/04/21 1521

## 2021-07-30 NOTE — ED Triage Notes (Signed)
Pt reports increase c/p today, worse with inspiration.  HX: spinal fusion

## 2021-07-31 ENCOUNTER — Ambulatory Visit (INDEPENDENT_AMBULATORY_CARE_PROVIDER_SITE_OTHER): Payer: BC Managed Care – PPO | Admitting: Cardiology

## 2021-07-31 ENCOUNTER — Encounter: Payer: Self-pay | Admitting: Cardiology

## 2021-07-31 DIAGNOSIS — K529 Noninfective gastroenteritis and colitis, unspecified: Secondary | ICD-10-CM

## 2021-07-31 DIAGNOSIS — I2510 Atherosclerotic heart disease of native coronary artery without angina pectoris: Secondary | ICD-10-CM | POA: Diagnosis not present

## 2021-07-31 DIAGNOSIS — R55 Syncope and collapse: Secondary | ICD-10-CM | POA: Diagnosis not present

## 2021-07-31 NOTE — Assessment & Plan Note (Signed)
Today has been vomiting off and off.  He says that this can come and go at times.  Lipase was normal in the emergency room yesterday.  No abnormal liver enzymes.  He states if he smells food he feels nauseous.  Today in the exam room, he is a trash can in front of him and he is heaving at times.  Was afebrile yesterday in the ER.  No white count.  I asked him to call his primary care office tomorrow if he is not feeling better.  Hydrate.  He is not taking his Augmentin because he is afraid of penicillin allergy.  He can continue with the azithromycin.  He was given this to treat a possible pneumonia seen on CT scan.  He was having some left-sided flank discomfort.

## 2021-07-31 NOTE — Assessment & Plan Note (Signed)
Moderate nonobstructive CAD seen on catheterization in November 2022.  Flank pain is not cardiac.  Continue with goal-directed medical therapy, atorvastatin 80 mg as well as blood pressure control.

## 2021-07-31 NOTE — Patient Instructions (Signed)
Medication Instructions:  STOP Augmentin (Amoxicillin)  Labwork: None today  Testing/Procedures: None today  Follow-Up:  Keep 09/09/21 appointment already scheduled  Any Other Special Instructions Will Be Listed Below (If Applicable).  If you need a refill on your cardiac medications before your next appointment, please call your pharmacy.

## 2021-07-31 NOTE — Assessment & Plan Note (Signed)
No current syncope.  Previous ZIO monitor showed no dysrhythmias.  Echo normal.  Unclear etiology.

## 2021-07-31 NOTE — Progress Notes (Signed)
Cardiology Office Note:    Date:  07/31/2021   ID:  Andrew Arnold, DOB 12/17/1955, MRN [id number]  PCP:  [name], MD   [facility] Providers Cardiologist:  None     Referring MD: Carlis Abbott, NP    History of Present Illness:    Andrew Arnold is a 66 y.o. male here for the evaluation of chest pain at the request of Lenon Oms, NP.  Yesterday was in the emergency department and developed left-sided chest pain.  Worse with inspiration.  In November had a heart catheterization which showed moderate CAD managed medically.  No evidence of PE on CT personally reviewed.  There was some haziness consistent with infection on the CT and therefore he was given Augmentin and Zithromax.  He has been seen by Dr. Haroldine Laws most recently in November 2022.  In review of his notes: Followed by neuro urgery for RUE/LUE weakness.. CT with cervical spine stenosis   On 05/15/2021 presented ED with syncope. Standing in front of the refrigerator then woke up on the floor.  EKG SR 64 bpm. ECHO EF 65-70%, LVH, grade II DD, and normal RV. Carotids and lower extremity dopplers normal.   Seen in [facility] with progessive LE edema and SOB. Meds adjusted and referred for cath as part of pre-op clearance. Cath 05/23/21 as below non-obstructive CAD and normal RHC   He underwent successful cervical decompression on 57/01/77 without complication.   Today he comes in dry heaving, vomiting.  Smell of food is making him nauseous.  States that he has not been able to eat much all day long.  No fevers.  Lipase yesterday was normal.  He is not having any abdominal tenderness or pain.  He states that he has had symptoms like this before.  Denies any further flank pain.    Past Medical History:  Diagnosis Date   Allergic rhinitis due to pollen    on allergy shots   Arrhythmia    1980s hospitalized for HTN and ?irregular rhythm but no specific dx but resolved during hospitalization   Diabetes  mellitus without complication ([place])    HTN (hypertension)    Hyperlipidemia     Past Surgical History:  Procedure Laterality Date   ANTERIOR CERVICAL DECOMP/DISCECTOMY FUSION N/A 06/03/2021   Procedure: Anterior Cervical Decompression Fusion  Cervical three-four;  Surgeon: Vallarie Mare, MD;  Location: [facility];  Service: Neurosurgery;  Laterality: N/A;   colonoscopy  2005   Dr. Gala Romney: normal rectum, sigmoid diverticula in colonic mucosa   COLONOSCOPY N/A 02/15/2014   Procedure: COLONOSCOPY;  Surgeon: Daneil Dolin, MD;  [facility];  Service: Endoscopy;  Laterality: N/A;  2:00   RIGHT/LEFT HEART CATH AND CORONARY ANGIOGRAPHY N/A 05/23/2021   Procedure: RIGHT/LEFT HEART CATH AND CORONARY ANGIOGRAPHY;  Surgeon: Jolaine Artist, MD;  Location: [facility];  Service: Cardiovascular;  Laterality: N/A;    Current Medications: Current Meds  Medication Sig   ascorbic acid (VITAMIN C) 500 MG tablet Take 500 mg by mouth every morning.    atorvastatin (LIPITOR) 80 MG tablet Take 80 mg by mouth at bedtime.   azithromycin (ZITHROMAX) 250 MG tablet Take 1 tablet (250 mg total) by mouth daily. Take first 2 tablets together, then 1 every day until finished.   B Complex Vitamins (B COMPLEX 100 PO) Take 1 tablet by mouth every morning.    cetirizine (ZYRTEC) 10 MG tablet Take 10 mg by mouth in the morning.  cloNIDine (CATAPRES) 0.2 MG tablet Take 0.2 mg by mouth 3 (three) times daily.   docusate sodium (COLACE) 100 MG capsule Take 1 capsule (100 mg total) by mouth 2 (two) times daily. (Patient taking differently: Take 100 mg by mouth 2 (two) times daily. As needed)   furosemide (LASIX) 20 MG tablet Take 1 tablet (20 mg total) by mouth daily as needed.   hydrochlorothiazide (HYDRODIURIL) 25 MG tablet Take 25 mg by mouth every evening.   metFORMIN (GLUCOPHAGE) 1000 MG tablet Take 1,000 mg by mouth 2 (two) times daily.   oxyCODONE-acetaminophen (PERCOCET) 5-325 MG tablet Take 1  tablet by mouth every 4 (four) hours as needed for severe pain. (Patient taking differently: Take 1 tablet by mouth every 4 (four) hours as needed for severe pain. As needed)   potassium chloride (K-DUR) 10 MEQ tablet Take 1 tablet by mouth daily in the afternoon.   sacubitril-valsartan (ENTRESTO) 49-51 MG Take 1 tablet by mouth 2 (two) times daily.   tamsulosin (FLOMAX) 0.4 MG CAPS capsule Take 1 capsule (0.4 mg total) by mouth daily. (Patient taking differently: Take 0.4 mg by mouth every evening.)   [DISCONTINUED] amoxicillin-clavulanate (AUGMENTIN) 875-125 MG tablet Take 1 tablet by mouth every 12 (twelve) hours.     Allergies:   Patient has no known allergies.   Social History   Socioeconomic History   Marital status: Single    Spouse name: Not on file   Number of children: Not on file   Years of education: Not on file   Highest education level: Not on file  Occupational History   Occupation: school counselor  Tobacco Use   Smoking status: Never   Smokeless tobacco: Never  Vaping Use   Vaping Use: Never used  Substance and Sexual Activity   Alcohol use: No   Drug use: No   Sexual activity: Yes  Other Topics Concern   Not on file  Social History Narrative   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[place], [place]   Lives in [place] Determinants of Health   Financial Resource Strain: Not on file  Food Insecurity: Not on file  Transportation Needs: Not on file  Physical Activity: Not on file  Stress: Not on file  Social Connections: Not on file     Family History: The patient's family history includes Cancer in his mother; Cirrhosis in his father; Diabetes in his sister; Other in an other family member. There is no history of Colon cancer.  ROS:   Please see the history of present illness.     All other systems reviewed and are negative.  EKGs/Labs/Other Studies Reviewed:    The following studies were reviewed today:   Cath 05/23/21      Prox RCA lesion is  20% stenosed.   Prox Cx lesion is 30% stenosed.   Prox LAD to Mid LAD lesion is 30% stenosed.   Dist LAD-1 lesion is 50% stenosed.   Dist LAD-2 lesion is 70% stenosed.   The left ventricular ejection fraction is greater than 65% by visual estimate.   Findings:   Ao  = 132/81 (99) LV = 147/10 RA = 2 RV = 26/6 PA = 24/1 (14) PCW = 6 Fick cardiac output/index = 8.0/3.5 PVR = 1.0 WU Ao sat = 99% PA sat = 72%, 73% SVC sat = 76%   Assessment: 1. Mild to moderate non-obstructive CAD 2. EF 65-70% 3. Normal filling pressures 4. High cardiac  output with no evidence of intracardiac shunting    EKG: Prior EKG showed left bundle branch block, prior to this 1 incomplete left bundle branch block.  Recent Labs: 07/30/2021: ALT 12; B Natriuretic Peptide 46.1; BUN 8; Creatinine, Ser 1.21; Hemoglobin 11.8; Platelets 308; Potassium 3.3; Sodium 138  Recent Lipid Panel No results found for: CHOL, TRIG, HDL, CHOLHDL, VLDL, LDLCALC, LDLDIRECT   Risk Assessment/Calculations:              Physical Exam:    VS:  BP 130/70    Pulse (!) 104    Ht 5\' 9"  (1.753 m)    Wt 228 lb 9.6 oz (103.7 kg)    SpO2 96%    BMI 33.76 kg/m     Wt Readings from Last 3 Encounters:  07/31/21 228 lb 9.6 oz (103.7 kg)  06/11/21 (P) 242 lb 6.4 oz (110 kg)  06/03/21 250 lb (113.4 kg)     GEN:  Well nourished, well developed nauseous, trash can in front of him.  Occasionally spitting. HEENT: Normal NECK: No JVD; No carotid bruits LYMPHATICS: No lymphadenopathy CARDIAC: RRR, no murmurs, no rubs, gallops RESPIRATORY:  Clear to auscultation without rales, wheezing or rhonchi  ABDOMEN: Soft, non-tender, non-distended MUSCULOSKELETAL:  No edema; No deformity  SKIN: Warm and dry NEUROLOGIC:  Alert and oriented x 3 PSYCHIATRIC:  Normal affect   ASSESSMENT:    1. Acute gastroenteritis   2. Syncope, unspecified syncope type   3. Coronary artery disease involving native coronary artery of native heart without  angina pectoris    PLAN:    In order of problems listed above:  Acute gastroenteritis Today has been vomiting off and off.  He says that this can come and go at times.  Lipase was normal in the emergency room yesterday.  No abnormal liver enzymes.  He states if he smells food he feels nauseous.  Today in the exam room, he is a trash can in front of him and he is heaving at times.  Was afebrile yesterday in the ER.  No white count.  I asked him to call his primary care office tomorrow if he is not feeling better.  Hydrate.  He is not taking his Augmentin because he is afraid of penicillin allergy.  He can continue with the azithromycin.  He was given this to treat a possible pneumonia seen on CT scan.  He was having some left-sided flank discomfort.  Syncope No current syncope.  Previous ZIO monitor showed no dysrhythmias.  Echo normal.  Unclear etiology.  Coronary artery disease involving native coronary artery of native heart without angina pectoris Moderate nonobstructive CAD seen on catheterization in November 2022.  Flank pain is not cardiac.  Continue with goal-directed medical therapy, atorvastatin 80 mg as well as blood pressure control.      Medication Adjustments/Labs and Tests Ordered: Current medicines are reviewed at length with the patient today.  Concerns regarding medicines are outlined above.  No orders of the defined types were placed in this encounter.  No orders of the defined types were placed in this encounter.   Patient Instructions  Medication Instructions:  STOP Augmentin (Amoxicillin)  Labwork: None today  Testing/Procedures: None today  Follow-Up:  Keep 09/09/21 appointment already scheduled  Any Other Special Instructions Will Be Listed Below (If Applicable).  If you need a refill on your cardiac medications before your next appointment, please call your pharmacy.    Signed, Candee Furbish, MD  07/31/2021 4:24 PM  [facility]

## 2021-08-01 ENCOUNTER — Encounter (HOSPITAL_COMMUNITY): Payer: Self-pay | Admitting: Emergency Medicine

## 2021-08-01 ENCOUNTER — Other Ambulatory Visit: Payer: Self-pay

## 2021-08-01 ENCOUNTER — Emergency Department (HOSPITAL_COMMUNITY): Payer: BC Managed Care – PPO

## 2021-08-01 ENCOUNTER — Inpatient Hospital Stay (HOSPITAL_COMMUNITY)
Admission: EM | Admit: 2021-08-01 | Discharge: 2021-08-05 | DRG: 194 | Disposition: A | Discharge status: Home / Self Care | Payer: BC Managed Care – PPO | Attending: Internal Medicine | Admitting: Internal Medicine

## 2021-08-01 DIAGNOSIS — I951 Orthostatic hypotension: Secondary | ICD-10-CM | POA: Diagnosis present

## 2021-08-01 DIAGNOSIS — I2510 Atherosclerotic heart disease of native coronary artery without angina pectoris: Secondary | ICD-10-CM | POA: Diagnosis present

## 2021-08-01 DIAGNOSIS — E860 Dehydration: Secondary | ICD-10-CM | POA: Diagnosis present

## 2021-08-01 DIAGNOSIS — E876 Hypokalemia: Secondary | ICD-10-CM | POA: Diagnosis present

## 2021-08-01 DIAGNOSIS — I10 Essential (primary) hypertension: Secondary | ICD-10-CM | POA: Diagnosis not present

## 2021-08-01 DIAGNOSIS — Z833 Family history of diabetes mellitus: Secondary | ICD-10-CM

## 2021-08-01 DIAGNOSIS — E119 Type 2 diabetes mellitus without complications: Secondary | ICD-10-CM

## 2021-08-01 DIAGNOSIS — J301 Allergic rhinitis due to pollen: Secondary | ICD-10-CM | POA: Diagnosis present

## 2021-08-01 DIAGNOSIS — I447 Left bundle-branch block, unspecified: Secondary | ICD-10-CM | POA: Diagnosis present

## 2021-08-01 DIAGNOSIS — N179 Acute kidney failure, unspecified: Secondary | ICD-10-CM | POA: Diagnosis present

## 2021-08-01 DIAGNOSIS — K529 Noninfective gastroenteritis and colitis, unspecified: Secondary | ICD-10-CM

## 2021-08-01 DIAGNOSIS — E785 Hyperlipidemia, unspecified: Secondary | ICD-10-CM | POA: Diagnosis present

## 2021-08-01 DIAGNOSIS — Z981 Arthrodesis status: Secondary | ICD-10-CM

## 2021-08-01 DIAGNOSIS — Z7984 Long term (current) use of oral hypoglycemic drugs: Secondary | ICD-10-CM

## 2021-08-01 DIAGNOSIS — J189 Pneumonia, unspecified organism: Secondary | ICD-10-CM | POA: Diagnosis not present

## 2021-08-01 DIAGNOSIS — N400 Enlarged prostate without lower urinary tract symptoms: Secondary | ICD-10-CM | POA: Diagnosis present

## 2021-08-01 DIAGNOSIS — Z79899 Other long term (current) drug therapy: Secondary | ICD-10-CM

## 2021-08-01 DIAGNOSIS — R911 Solitary pulmonary nodule: Secondary | ICD-10-CM

## 2021-08-01 DIAGNOSIS — R112 Nausea with vomiting, unspecified: Secondary | ICD-10-CM

## 2021-08-01 DIAGNOSIS — Z20822 Contact with and (suspected) exposure to covid-19: Secondary | ICD-10-CM | POA: Diagnosis present

## 2021-08-01 DIAGNOSIS — R918 Other nonspecific abnormal finding of lung field: Secondary | ICD-10-CM | POA: Diagnosis present

## 2021-08-01 HISTORY — DX: Nausea with vomiting, unspecified: R11.2

## 2021-08-01 LAB — URINALYSIS, ROUTINE W REFLEX MICROSCOPIC
Bilirubin Urine: NEGATIVE
Glucose, UA: NEGATIVE mg/dL
Hgb urine dipstick: NEGATIVE
Ketones, ur: 80 mg/dL — AB
Leukocytes,Ua: NEGATIVE
Nitrite: NEGATIVE
Protein, ur: 30 mg/dL — AB
Specific Gravity, Urine: 1.015 (ref 1.005–1.030)
pH: 5 (ref 5.0–8.0)

## 2021-08-01 LAB — COMPREHENSIVE METABOLIC PANEL
ALT: 13 U/L (ref 0–44)
AST: 15 U/L (ref 15–41)
Albumin: 3.8 g/dL (ref 3.5–5.0)
Alkaline Phosphatase: 88 U/L (ref 38–126)
Anion gap: 16 — ABNORMAL HIGH (ref 5–15)
BUN: 9 mg/dL (ref 8–23)
CO2: 22 mmol/L (ref 22–32)
Calcium: 9.6 mg/dL (ref 8.9–10.3)
Chloride: 101 mmol/L (ref 98–111)
Creatinine, Ser: 1.5 mg/dL — ABNORMAL HIGH (ref 0.61–1.24)
GFR, Estimated: 51 mL/min — ABNORMAL LOW (ref >60)
Glucose, Bld: 102 mg/dL — ABNORMAL HIGH (ref 70–99)
Potassium: 3.2 mmol/L — ABNORMAL LOW (ref 3.5–5.1)
Sodium: 139 mmol/L (ref 135–145)
Total Bilirubin: 0.9 mg/dL (ref 0.3–1.2)
Total Protein: 7.5 g/dL (ref 6.5–8.1)

## 2021-08-01 LAB — CBC WITH DIFFERENTIAL/PLATELET
Abs Immature Granulocytes: 0.02 10*3/uL (ref 0.00–0.07)
Basophils Absolute: 0 10*3/uL (ref 0.0–0.1)
Basophils Relative: 0 %
Eosinophils Absolute: 0.1 10*3/uL (ref 0.0–0.5)
Eosinophils Relative: 1 %
HCT: 39.4 % (ref 39.0–52.0)
Hemoglobin: 12.7 g/dL — ABNORMAL LOW (ref 13.0–17.0)
Immature Granulocytes: 0 %
Lymphocytes Relative: 23 %
Lymphs Abs: 1.4 10*3/uL (ref 0.7–4.0)
MCH: 32.4 pg (ref 26.0–34.0)
MCHC: 32.2 g/dL (ref 30.0–36.0)
MCV: 100.5 fL — ABNORMAL HIGH (ref 80.0–100.0)
Monocytes Absolute: 0.6 10*3/uL (ref 0.1–1.0)
Monocytes Relative: 11 %
Neutro Abs: 3.9 10*3/uL (ref 1.7–7.7)
Neutrophils Relative %: 65 %
Platelets: 325 10*3/uL (ref 150–400)
RBC: 3.92 MIL/uL — ABNORMAL LOW (ref 4.22–5.81)
RDW: 13.4 % (ref 11.5–15.5)
WBC: 6 10*3/uL (ref 4.0–10.5)
nRBC: 0 % (ref 0.0–0.2)

## 2021-08-01 LAB — RESP PANEL BY RT-PCR (FLU A&B, COVID) ARPGX2
Influenza A by PCR: NEGATIVE
Influenza B by PCR: NEGATIVE
SARS Coronavirus 2 by RT PCR: NEGATIVE

## 2021-08-01 LAB — TROPONIN I (HIGH SENSITIVITY): Troponin I (High Sensitivity): 27 ng/L — ABNORMAL HIGH (ref <18)

## 2021-08-01 IMAGING — CR DG CHEST 2V
2 series · 2 of 2 positions shown · non-contrast
Comparison: CT a chest dated [DATE]. Chest x-ray dated
[DATE].

CLINICAL DATA: Shortness of breath.

EXAM:
CHEST - 2 VIEW

[chest pa]
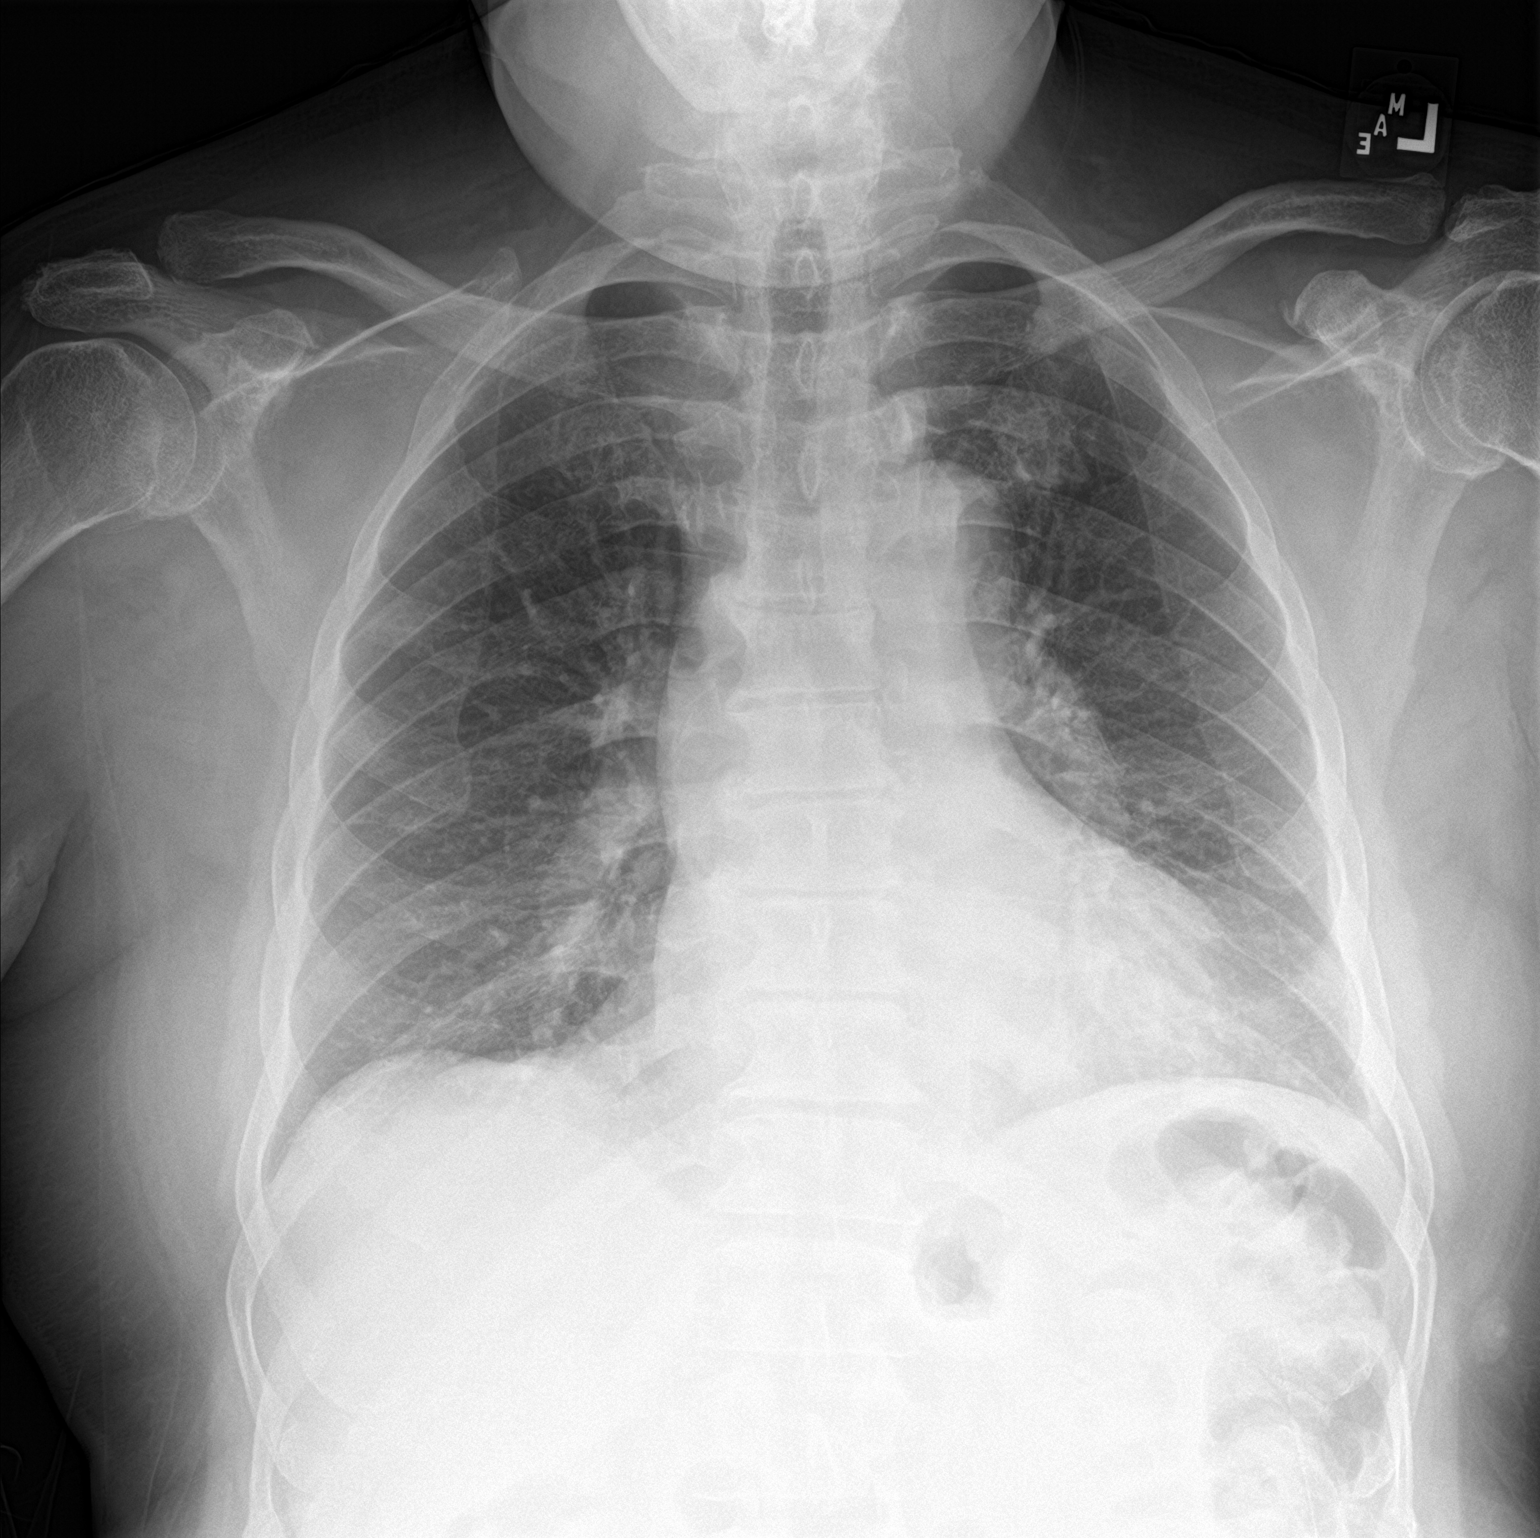

[chest lat]
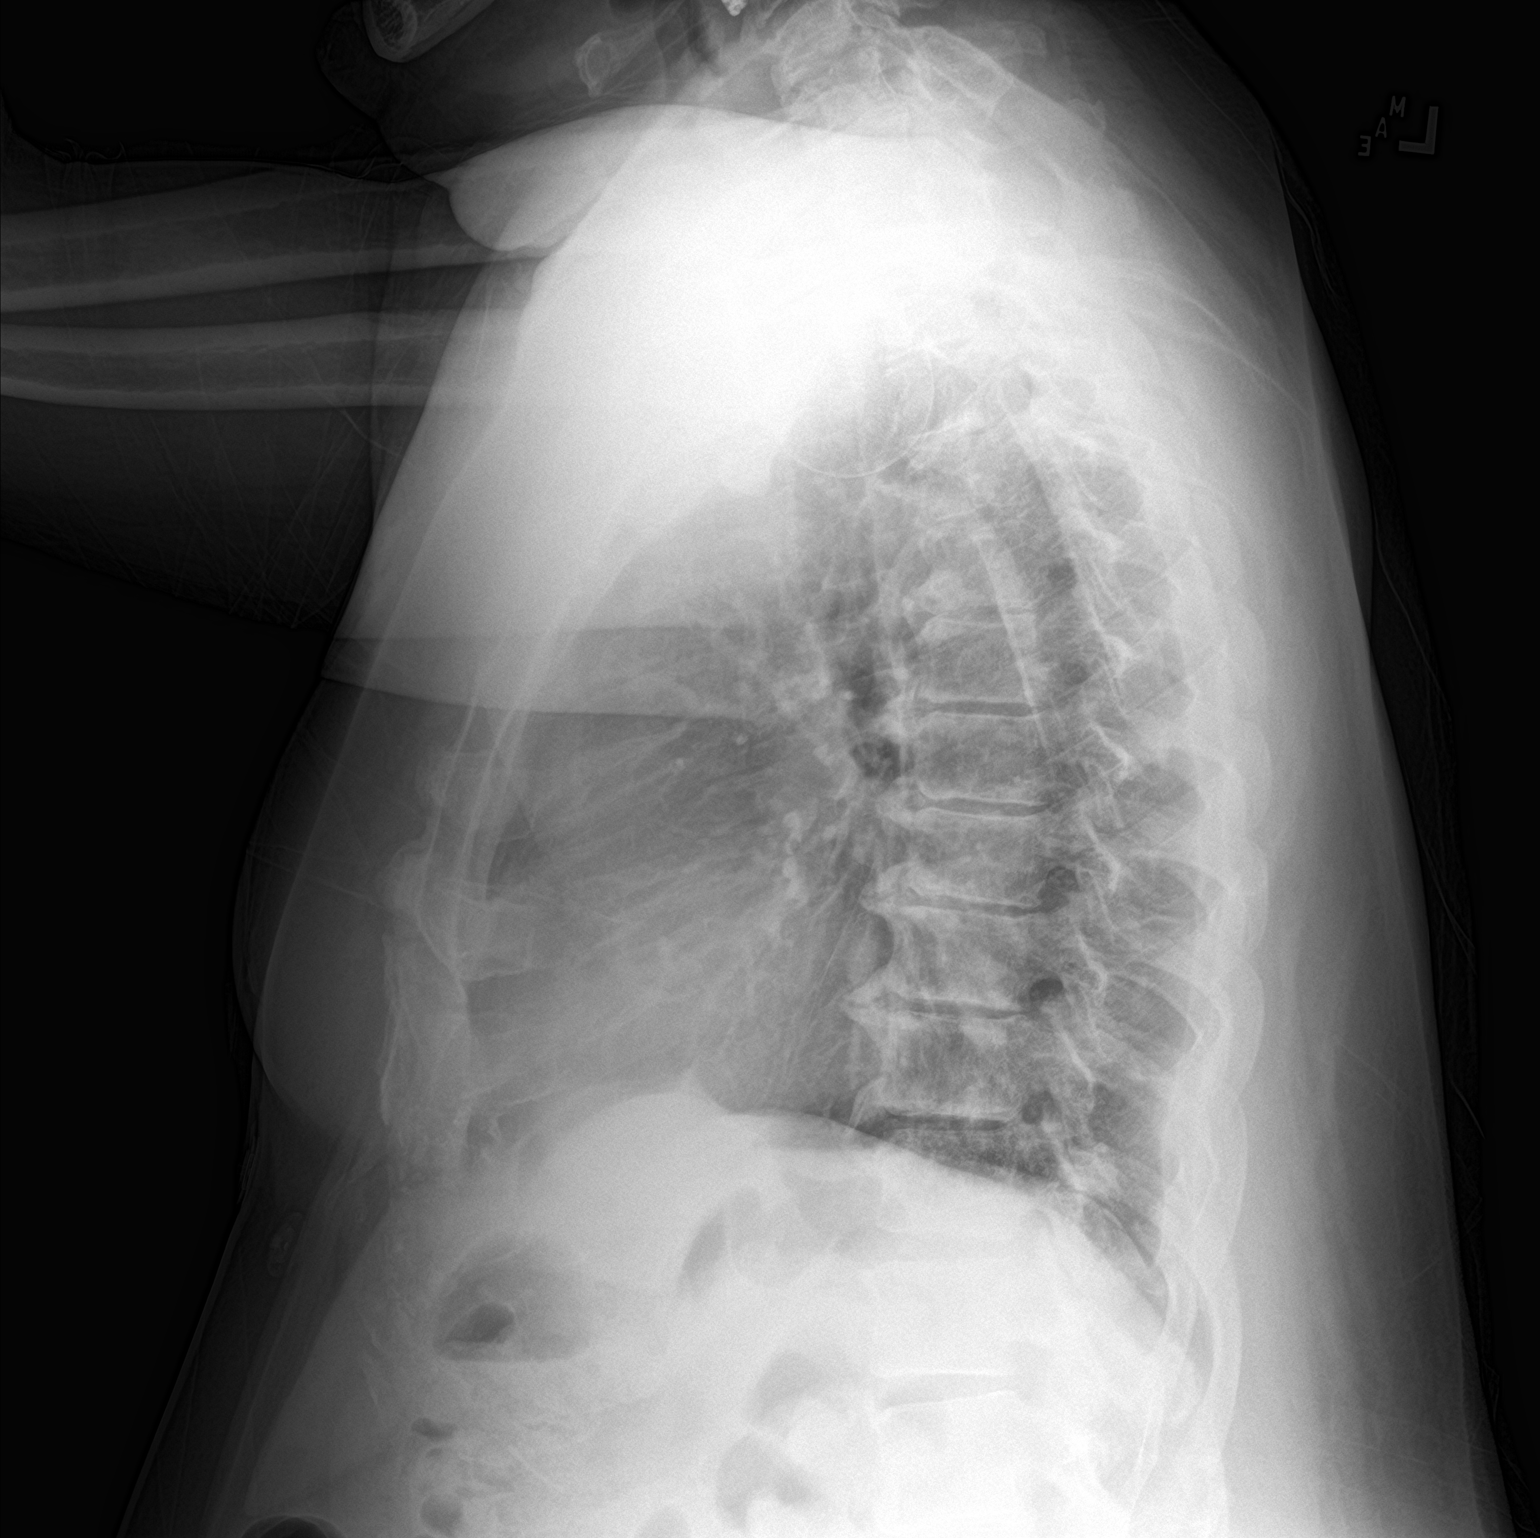

[2 of 2 positions shown; findings below may reference images not displayed]

FINDINGS: Unchanged mild cardiomegaly. Normal pulmonary vascularity. Subtle
patchy opacities in the left lower lobe, better evaluated on recent
chest CT. No pleural effusion or pneumothorax. No acute osseous
abnormality.
IMPRESSION: 1. Left lower lobe pneumonia, better evaluated on recent chest CT.

## 2021-08-01 MED ORDER — SODIUM CHLORIDE 0.9 % IV BOLUS
1000.0000 mL | Freq: Once | INTRAVENOUS | Status: AC
  Administered 2021-08-01: 1000 mL via INTRAVENOUS

## 2021-08-01 MED ORDER — ATORVASTATIN CALCIUM 80 MG PO TABS
80.0000 mg | ORAL_TABLET | Freq: Every day | ORAL | Status: DC
  Administered 2021-08-01 – 2021-08-04 (×4): 80 mg via ORAL
  Filled 2021-08-01: qty 2
  Filled 2021-08-01 (×3): qty 1

## 2021-08-01 MED ORDER — INSULIN ASPART 100 UNIT/ML IJ SOLN
0.0000 [IU] | INTRAMUSCULAR | Status: DC
  Administered 2021-08-02 – 2021-08-05 (×6): 1 [IU] via SUBCUTANEOUS

## 2021-08-01 MED ORDER — DIPHENHYDRAMINE HCL 50 MG/ML IJ SOLN
12.5000 mg | Freq: Once | INTRAMUSCULAR | Status: AC
  Administered 2021-08-01: 12.5 mg via INTRAVENOUS
  Filled 2021-08-01: qty 1

## 2021-08-01 MED ORDER — SODIUM CHLORIDE 0.9 % IV SOLN
500.0000 mg | INTRAVENOUS | Status: DC
  Administered 2021-08-01 – 2021-08-03 (×3): 500 mg via INTRAVENOUS
  Filled 2021-08-01 (×4): qty 5

## 2021-08-01 MED ORDER — CLONIDINE HCL 0.2 MG PO TABS
0.2000 mg | ORAL_TABLET | Freq: Three times a day (TID) | ORAL | Status: DC
  Administered 2021-08-01 – 2021-08-02 (×2): 0.2 mg via ORAL
  Filled 2021-08-01 (×2): qty 1

## 2021-08-01 MED ORDER — DEXTROSE-NACL 5-0.45 % IV SOLN: INTRAVENOUS | Status: DC

## 2021-08-01 MED ORDER — SODIUM CHLORIDE 0.9 % IV SOLN
2.0000 g | INTRAVENOUS | Status: DC
  Administered 2021-08-02 – 2021-08-04 (×3): 2 g via INTRAVENOUS
  Filled 2021-08-01 (×3): qty 20

## 2021-08-01 MED ORDER — PROCHLORPERAZINE EDISYLATE 10 MG/2ML IJ SOLN
10.0000 mg | Freq: Once | INTRAMUSCULAR | Status: AC
  Administered 2021-08-01: 10 mg via INTRAVENOUS
  Filled 2021-08-01: qty 2

## 2021-08-01 MED ORDER — POTASSIUM CHLORIDE 10 MEQ/100ML IV SOLN
10.0000 meq | Freq: Once | INTRAVENOUS | Status: AC
  Administered 2021-08-01: 10 meq via INTRAVENOUS
  Filled 2021-08-01: qty 100

## 2021-08-01 MED ORDER — POTASSIUM CHLORIDE 10 MEQ/100ML IV SOLN
10.0000 meq | Freq: Once | INTRAVENOUS | Status: AC
  Administered 2021-08-02: 10 meq via INTRAVENOUS
  Filled 2021-08-01: qty 100

## 2021-08-01 MED ORDER — TAMSULOSIN HCL 0.4 MG PO CAPS
0.4000 mg | ORAL_CAPSULE | Freq: Every evening | ORAL | Status: DC
  Administered 2021-08-01 – 2021-08-04 (×4): 0.4 mg via ORAL
  Filled 2021-08-01 (×4): qty 1

## 2021-08-01 MED ORDER — ENOXAPARIN SODIUM 40 MG/0.4ML IJ SOSY
40.0000 mg | PREFILLED_SYRINGE | INTRAMUSCULAR | Status: DC
  Administered 2021-08-01 – 2021-08-04 (×4): 40 mg via SUBCUTANEOUS
  Filled 2021-08-01 (×4): qty 0.4

## 2021-08-01 MED ORDER — SODIUM CHLORIDE 0.9 % IV SOLN
1.0000 g | Freq: Once | INTRAVENOUS | Status: AC
  Administered 2021-08-01: 1 g via INTRAVENOUS
  Filled 2021-08-01: qty 10

## 2021-08-01 MED ORDER — ONDANSETRON HCL 4 MG/2ML IJ SOLN
4.0000 mg | Freq: Once | INTRAMUSCULAR | Status: AC
  Administered 2021-08-01: 4 mg via INTRAVENOUS
  Filled 2021-08-01: qty 2

## 2021-08-01 NOTE — H&P (Signed)
History and Physical    Andrew Arnold IWP:[id number] DOB: June 23, 1956 DOA: 08/01/2021  PCP: [name], MD  Patient coming from: Home  I have personally briefly reviewed patient's old medical records in [place]  Chief Complaint: N/V  HPI: Andrew Arnold is a 66 y.o. male with medical history significant of DM2, HTN, HLD, non-occlusive CAD (LHC 05/23/21).  Pt seen in ED on 1/18 with pleuritic CP (L sided CP worse with inspiration, unaffected by movement or activity), trops neg, CTA chest showed LLL nodular infiltrates compatible with PNA.  Pt discharged on azithromycin and Augmentin.  Pt hasnt been taking Augmentin (pharm refused to fill due to their records indicating he has a PCN allergy history, pt doesn't recall having a PCN allergy history and not listed in our allergies on patient).  Has been taking azithro.  For the past couple of days he has had generalized weakness, N/V, poor appetite and poor PO intake.  Has generalized weakness.  No abd pain, chills, diarrhea, nor fever.  ED Course: Creat 1.5 up from 1.2 on 1/18  AG 16.  CXR shows LLL PNA.  WBC neg, no SIRS.  Trop 27.   Past Medical History:  Diagnosis Date   Allergic rhinitis due to pollen    on allergy shots   Arrhythmia    1980s hospitalized for HTN and ?irregular rhythm but no specific dx but resolved during hospitalization   Diabetes mellitus without complication ([place])    HTN (hypertension)    Hyperlipidemia     Past Surgical History:  Procedure Laterality Date   ANTERIOR CERVICAL DECOMP/DISCECTOMY FUSION N/A 06/03/2021   Procedure: Anterior Cervical Decompression Fusion  Cervical three-four;  Surgeon: Vallarie Mare, MD;  Location: [facility];  Service: Neurosurgery;  Laterality: N/A;   colonoscopy  2005   Dr. Gala Romney: normal rectum, sigmoid diverticula in colonic mucosa   COLONOSCOPY N/A 02/15/2014   Procedure: COLONOSCOPY;  Surgeon: Daneil Dolin, MD;  [facility];   Service: Endoscopy;  Laterality: N/A;  2:00   RIGHT/LEFT HEART CATH AND CORONARY ANGIOGRAPHY N/A 05/23/2021   Procedure: RIGHT/LEFT HEART CATH AND CORONARY ANGIOGRAPHY;  Surgeon: Jolaine Artist, MD;  Location: [facility];  Service: Cardiovascular;  Laterality: N/A;     reports that he has never smoked. He has never used smokeless tobacco. He reports that he does not drink alcohol and does not use drugs.  No Known Allergies  Family History  Problem Relation Age of Onset   Cancer Mother    Cirrhosis Father        cirrhosis of the liver; alcohol related   Diabetes Sister    Other Other        neice was on allergy vaccine when young   Colon cancer Neg Hx     Prior to Admission medications   Medication Sig Start Date End Date Taking? Authorizing Provider  ascorbic acid (VITAMIN C) 500 MG tablet Take 500 mg by mouth every morning.    Yes [provider]  atorvastatin (LIPITOR) 80 MG tablet Take 80 mg by mouth at bedtime.   Yes [provider]  azithromycin (ZITHROMAX) 250 MG tablet Take 1 tablet (250 mg total) by mouth daily. Take first 2 tablets together, then 1 every day until finished. Patient taking differently: Take 250 mg by mouth See admin instructions. 2 tabs day 1, then 1 tablet qd x 4 days 07/30/21  Yes Caccavale, Sophia, PA-C  B Complex Vitamins (B COMPLEX  100 PO) Take 1 tablet by mouth every morning.    Yes [provider]  cetirizine (ZYRTEC) 10 MG tablet Take 10 mg by mouth in the morning.   Yes [provider]  cloNIDine (CATAPRES) 0.2 MG tablet Take 0.2 mg by mouth 3 (three) times daily. 04/29/21  Yes [provider]  hydrochlorothiazide (HYDRODIURIL) 25 MG tablet Take 25 mg by mouth every evening.   Yes [provider]  metFORMIN (GLUCOPHAGE) 1000 MG tablet Take 1,000 mg by mouth 2 (two) times daily. 03/21/21  Yes [provider]  potassium chloride (K-DUR) 10 MEQ tablet Take 10 mEq by mouth every  evening. 08/12/17  Yes [provider]  sacubitril-valsartan (ENTRESTO) 49-51 MG Take 1 tablet by mouth 2 (two) times daily. 06/11/21  Yes Bensimhon, Shaune Pascal, MD  tamsulosin (FLOMAX) 0.4 MG CAPS capsule Take 1 capsule (0.4 mg total) by mouth daily. Patient taking differently: Take 0.4 mg by mouth every evening. 04/10/21  Yes Irine Seal, MD  docusate sodium (COLACE) 100 MG capsule Take 1 capsule (100 mg total) by mouth 2 (two) times daily. Patient not taking: Reported on 08/01/2021 06/04/21 06/04/22  Vallarie Mare, MD  furosemide (LASIX) 20 MG tablet Take 1 tablet (20 mg total) by mouth daily as needed. Patient not taking: Reported on 08/01/2021 07/08/21   Rafael Bihari, FNP  oxyCODONE-acetaminophen (PERCOCET) 5-325 MG tablet Take 1 tablet by mouth every 4 (four) hours as needed for severe pain. Patient not taking: Reported on 08/01/2021 06/04/21 06/04/22  Vallarie Mare, MD    Physical Exam: Vitals:   08/01/21 1745 08/01/21 1945 08/01/21 2000 08/01/21 2030  BP: (!) 142/71 (!) 171/88 (!) 170/96 (!) 180/103  Pulse:  93 91 94  Resp: 17 20 (!) 21 (!) 22  Temp:      TempSrc:      SpO2:  97% 97% 98%    Constitutional: NAD, calm, comfortable Eyes: PERRL, lids and conjunctivae normal ENMT: Mucous membranes are moist. Posterior pharynx clear of any exudate or lesions.Normal dentition.  Neck: normal, supple, no masses, no thyromegaly Respiratory: clear to auscultation bilaterally, no wheezing, no crackles. Normal respiratory effort. No accessory muscle use.  Cardiovascular: Regular rate and rhythm, no murmurs / rubs / gallops. No extremity edema. 2+ pedal pulses. No carotid bruits.  Abdomen: no tenderness, no masses palpated. No hepatosplenomegaly. Bowel sounds positive.  Musculoskeletal: no clubbing / cyanosis. No joint deformity upper and lower extremities. Good ROM, no contractures. Normal muscle tone.  Skin: no rashes, lesions, ulcers. No induration Neurologic: CN 2-12  grossly intact. Sensation intact, DTR normal. Strength 5/5 in all 4.  Psychiatric: Normal judgment and insight. Alert and oriented x 3. Normal mood.    Labs on Admission: I have personally reviewed following labs and imaging studies  CBC: Recent Labs  Lab 07/30/21 1428 08/01/21 1434  WBC 5.5 6.0  NEUTROABS  --  3.9  HGB 11.8* 12.7*  HCT 36.6* 39.4  MCV 100.5* 100.5*  PLT 308 035   Basic Metabolic Panel: Recent Labs  Lab 07/30/21 1428 08/01/21 1434  NA 138 139  K 3.3* 3.2*  CL 99 101  CO2 21* 22  GLUCOSE 88 102*  BUN 8 9  CREATININE 1.21 1.50*  CALCIUM 9.6 9.6   GFR: Estimated Creatinine Clearance: 56.7 mL/min (A) (by C-G formula based on SCr of 1.5 mg/dL (H)). Liver Function Tests: Recent Labs  Lab 07/30/21 1441 08/01/21 1434  AST 14* 15  ALT 12 13  ALKPHOS  86 88  BILITOT 1.5* 0.9  PROT 7.2 7.5  ALBUMIN 3.6 3.8   Recent Labs  Lab 07/30/21 1441  LIPASE 25   No results for input(s): AMMONIA in the last 168 hours. Coagulation Profile: No results for input(s): INR, PROTIME in the last 168 hours. Cardiac Enzymes: No results for input(s): CKTOTAL, CKMB, CKMBINDEX, TROPONINI in the last 168 hours. BNP (last 3 results) No results for input(s): PROBNP in the last 8760 hours. HbA1C: No results for input(s): HGBA1C in the last 72 hours. CBG: No results for input(s): GLUCAP in the last 168 hours. Lipid Profile: No results for input(s): CHOL, HDL, LDLCALC, TRIG, CHOLHDL, LDLDIRECT in the last 72 hours. Thyroid Function Tests: No results for input(s): TSH, T4TOTAL, FREET4, T3FREE, THYROIDAB in the last 72 hours. Anemia Panel: No results for input(s): VITAMINB12, FOLATE, FERRITIN, TIBC, IRON, RETICCTPCT in the last 72 hours. Urine analysis:    Component Value Date/Time   COLORURINE YELLOW 08/01/2021 1932   APPEARANCEUR HAZY (A) 08/01/2021 1932   APPEARANCEUR Clear 04/10/2021 1518   LABSPEC 1.015 08/01/2021 1932   PHURINE 5.0 08/01/2021 1932   GLUCOSEU  NEGATIVE 08/01/2021 1932   HGBUR NEGATIVE 08/01/2021 1932   BILIRUBINUR NEGATIVE 08/01/2021 1932   BILIRUBINUR Negative 04/10/2021 1518   KETONESUR 80 (A) 08/01/2021 1932   PROTEINUR 30 (A) 08/01/2021 1932   UROBILINOGEN 1.0 11/13/2013 0635   NITRITE NEGATIVE 08/01/2021 1932   LEUKOCYTESUR NEGATIVE 08/01/2021 1932    Radiological Exams on Admission: DG Chest 2 View  Result Date: 08/01/2021 CLINICAL DATA:  Shortness of breath. EXAM: CHEST - 2 VIEW COMPARISON:  CT a chest dated July 30, 2021. Chest x-ray dated August 30, 2017. FINDINGS: Unchanged mild cardiomegaly. Normal pulmonary vascularity. Subtle patchy opacities in the left lower lobe, better evaluated on recent chest CT. No pleural effusion or pneumothorax. No acute osseous abnormality. IMPRESSION: 1. Left lower lobe pneumonia, better evaluated on recent chest CT. Electronically Signed   By: Titus Dubin M.D.   On: 08/01/2021 14:50    EKG: Independently reviewed.  Assessment/Plan Principal Problem:   CAP (community acquired pneumonia) Active Problems:   Essential hypertension   Coronary artery disease involving native coronary artery of native heart without angina pectoris   Nausea and vomiting   DM2 (diabetes mellitus, type 2) (HCC)    CAP - PNA pathway Rocephin + azithro Tele monitor Check RVP COVID and flu neg. Needs follow up CT after resolution to confirm that the nodular findings on CT have resolved. N/V - Zofran PRN IVF: 1L bolus given in ED, will put on D5 half at 100 Hold diuretics Checking lactate given AG 16 Clear liquid diet, advance as tolerated Hypokalemia - replace K DM2 - Hold metformin Sensitive SSI Q4H HTN - Cont catapres Holding HCTZ and entresto due to slight creat bump (1.5 today up from 1.2 baseline) CAD without angina - Trop 27 today, multiple days of symptoms which sound very pleuritic.  ACS unlikely. LHC 05/23/21  DVT prophylaxis: Lovenox Code Status: Full Family  Communication: Family at bedside Disposition Plan: Home after able to tolerate POs Consults called: None Admission status: Place in 77   Issa Luster, [facility] [facility]  How to contact the [facility] Attending or Consulting provider [place] or covering provider during after hours [place], for this patient?  Check the care team in [facility] and look for a) attending/consulting [facility] provider listed and b) the [facility] team listed Log into [email or web address]  Amion Physician Scheduling and messaging  for groups and whole hospitals  On call and physician scheduling software for group practices, residents, hospitalists and other medical providers for call, clinic, rotation and shift schedules. OnCall Enterprise is a hospital-wide system for scheduling doctors and paging doctors on call. EasyPlot is for scientific plotting and data analysis.  [email or web address]  and use [place]'s universal password to access. If you do not have the password, please contact the hospital operator.  Locate the [facility] provider you are looking for under [facility] and page to a number that you can be directly reached. If you still have difficulty reaching the provider, please page the [facility] (Director on Call) for the Hospitalists listed on amion for assistance.  08/01/2021, 9:49 PM

## 2021-08-01 NOTE — ED Notes (Signed)
IV team at bedside. Two lines started. One set of cultures obtained- only one line draws off. Other labs obtained as well. Delay noted to MD.

## 2021-08-01 NOTE — ED Triage Notes (Signed)
Patient here with complaint of generalized weakness andintermittent emesis, diagnosed with pneumonia two days ago and instructed to followup with PCP,patient has not been able to make contact with PCP.Patient alert, oriented, and in no apparent distress at this time.

## 2021-08-01 NOTE — ED Notes (Addendum)
Attempted to start IV antibiotics. Existing IV has blown. Additional IV unable to obtain at this time. IV team consulted. Phlebotomy called to attempt lab draws.

## 2021-08-01 NOTE — ED Provider Notes (Signed)
[facility] EMERGENCY DEPARTMENT Provider Note   CSN: [id number] Arrival date & time: 08/01/21  1253     History  Chief Complaint  Patient presents with   Emesis    Andrew Arnold is a 66 y.o. male who presents to the emergency department complaining of generalized weakness and intermittent vomiting.  Patient was seen in the emergency department on 1/18, and was diagnosed with pneumonia.  He was discharged on azithromycin, and Augmentin (has not been taking the Augmentin), and instructed to follow-up with his PCP.  Patient was not able to make contact with his PCP.  He states that he has no appetite, and is felt so weak that he almost lost consciousness yesterday.  While he initially had complained of chest pain at his prior visit, he states that this is overall resolved.   Emesis Associated symptoms: no abdominal pain, no chills, no diarrhea and no fever      Past Medical History:  Diagnosis Date   Allergic rhinitis due to pollen    on allergy shots   Arrhythmia    1980s hospitalized for HTN and ?irregular rhythm but no specific dx but resolved during hospitalization   Diabetes mellitus without complication ([place])    HTN (hypertension)    Hyperlipidemia      Home Medications Prior to Admission medications   Medication Sig Start Date End Date Taking? Authorizing Provider  ascorbic acid (VITAMIN C) 500 MG tablet Take 500 mg by mouth every morning.     [provider]  atorvastatin (LIPITOR) 80 MG tablet Take 80 mg by mouth at bedtime.    [provider]  azithromycin (ZITHROMAX) 250 MG tablet Take 1 tablet (250 mg total) by mouth daily. Take first 2 tablets together, then 1 every day until finished. 07/30/21   Caccavale, Sophia, PA-C  B Complex Vitamins (B COMPLEX 100 PO) Take 1 tablet by mouth every morning.     [provider]  cetirizine (ZYRTEC) 10 MG tablet Take 10 mg by mouth in the morning.    [provider]   cloNIDine (CATAPRES) 0.2 MG tablet Take 0.2 mg by mouth 3 (three) times daily. 04/29/21   [provider]  docusate sodium (COLACE) 100 MG capsule Take 1 capsule (100 mg total) by mouth 2 (two) times daily. Patient taking differently: Take 100 mg by mouth 2 (two) times daily. As needed 06/04/21 06/04/22  Vallarie Mare, MD  furosemide (LASIX) 20 MG tablet Take 1 tablet (20 mg total) by mouth daily as needed. 07/08/21   [place], Maricela Bo, FNP  hydrochlorothiazide (HYDRODIURIL) 25 MG tablet Take 25 mg by mouth every evening.    [provider]  metFORMIN (GLUCOPHAGE) 1000 MG tablet Take 1,000 mg by mouth 2 (two) times daily. 03/21/21   [provider]  oxyCODONE-acetaminophen (PERCOCET) 5-325 MG tablet Take 1 tablet by mouth every 4 (four) hours as needed for severe pain. Patient taking differently: Take 1 tablet by mouth every 4 (four) hours as needed for severe pain. As needed 06/04/21 06/04/22  Vallarie Mare, MD  potassium chloride (K-DUR) 10 MEQ tablet Take 1 tablet by mouth daily in the afternoon. 08/12/17   [provider]  sacubitril-valsartan (ENTRESTO) 49-51 MG Take 1 tablet by mouth 2 (two) times daily. 06/11/21   Bensimhon, Shaune Pascal, MD  tamsulosin (FLOMAX) 0.4 MG CAPS capsule Take 1 capsule (0.4 mg total) by mouth daily. Patient taking differently: Take 0.4 mg by mouth every evening. 04/10/21  Irine Seal, MD      Allergies    Patient has no known allergies.    Review of Systems   Review of Systems  Constitutional:  Positive for appetite change. Negative for chills and fever.  Respiratory:  Negative for shortness of breath.   Cardiovascular:  Negative for chest pain.  Gastrointestinal:  Positive for nausea and vomiting. Negative for abdominal distention, abdominal pain, constipation and diarrhea.  Genitourinary:  Positive for decreased urine volume. Negative for dysuria.  All other systems reviewed and are negative.  Physical  Exam Updated Vital Signs BP (!) 156/79    Pulse 90    Temp 98.4 F (36.9 C) (Oral)    Resp 15    SpO2 100%  Physical Exam Vitals and nursing note reviewed.  Constitutional:      Appearance: Normal appearance.  HENT:     Head: Normocephalic and atraumatic.  Eyes:     Conjunctiva/sclera: Conjunctivae normal.  Cardiovascular:     Rate and Rhythm: Normal rate and regular rhythm.  Pulmonary:     Effort: Pulmonary effort is normal. No respiratory distress.     Breath sounds: Normal breath sounds.  Abdominal:     General: There is no distension.     Palpations: Abdomen is soft.     Tenderness: There is no abdominal tenderness.  Skin:    General: Skin is warm and dry.  Neurological:     General: No focal deficit present.     Mental Status: He is alert.     Comments: Neuro: Speech is clear, able to follow commands. PERRLA. EOMI. Sensation intact throughout. Str 5/5 all extremities.     ED Results / Procedures / Treatments   Labs (all labs ordered are listed, but only abnormal results are displayed) Labs Reviewed  CBC WITH DIFFERENTIAL/PLATELET - Abnormal; Notable for the following components:      Result Value   RBC 3.92 (*)    Hemoglobin 12.7 (*)    MCV 100.5 (*)    All other components within normal limits  COMPREHENSIVE METABOLIC PANEL - Abnormal; Notable for the following components:   Potassium 3.2 (*)    Glucose, Bld 102 (*)    Creatinine, Ser 1.50 (*)    GFR, Estimated 51 (*)    Anion gap 16 (*)    All other components within normal limits  RESP PANEL BY RT-PCR (FLU A&B, COVID) ARPGX2  URINALYSIS, ROUTINE W REFLEX MICROSCOPIC  TROPONIN I (HIGH SENSITIVITY)    EKG None  Radiology DG Chest 2 View  Result Date: 08/01/2021 CLINICAL DATA:  Shortness of breath. EXAM: CHEST - 2 VIEW COMPARISON:  CT a chest dated July 30, 2021. Chest x-ray dated August 30, 2017. FINDINGS: Unchanged mild cardiomegaly. Normal pulmonary vascularity. Subtle patchy opacities in the left  lower lobe, better evaluated on recent chest CT. No pleural effusion or pneumothorax. No acute osseous abnormality. IMPRESSION: 1. Left lower lobe pneumonia, better evaluated on recent chest CT. Electronically Signed   By: Titus Dubin M.D.   On: 08/01/2021 14:50    Procedures Procedures    Medications Ordered in ED Medications  potassium chloride 10 mEq in 100 mL IVPB (10 mEq Intravenous New Bag/Given 08/01/21 1803)  sodium chloride 0.9 % bolus 1,000 mL (1,000 mLs Intravenous New Bag/Given 08/01/21 1801)  ondansetron (ZOFRAN) injection 4 mg (4 mg Intravenous Given 08/01/21 1759)    ED Course/ Medical Decision Making/ A&P  Medical Decision Making Amount and/or Complexity of Data Reviewed Labs: ordered. Radiology: ordered.  Risk Prescription drug management.   This patient presents to the ED for concern of weakness and vomiting, this involves an extensive number of treatment options, and is a complaint that carries with it a high risk of complications and morbidity. The emergent differential diagnosis includes, but is not limited to,   ACS/MI, Boerhaave's, DKA, Intracranial Hemorrhage, Ischemic bowel, Meningitis, Sepsis, Acute gastric dilation, Acetaminophen toxicity, Adrenal insufficiency, Appendicitis, Aspirin toxicity, Bowel obstruction/ileus, Cholecystitis, CNS tumor. Electrolyte abnormalities, Elevated ICP, Gastric outlet obstruction, Hyperemesis gravidarum, Pancreatitis, Peritonitis, Ruptured viscus, Testicular torsion/ovarian torsion, Biliary colic, Cannabinoid hyperemesis syndrome, Disulfiram effect, ETOH, Gastritis, Gastroenteritis, Gastroparesis, Hepatitis, Ibuprofen, Labyrinthitis, Migraine, Motion sickness, Narcotic withdrawal, Thyroid, Pregnancy, Peptic ulcer disease, Renal colic, and UTI.   Co morbidities that complicate the patient evaluation: diagnosed with bacterial pneumonia on 1/18  Additional history obtained from chart review. External records  from outside source obtained and reviewed including, Augmentin was discontinued by cardiologist yesterday with concern for possible penicillin allergy.  Physical Exam: Physical exam performed. The pertinent findings include: lung sounds clear to auscultation in all fields.  Normal neurologic exam as above.  Lab Tests: I Ordered, and personally interpreted labs.  The pertinent results include: No leukocytosis, hemoglobin stable compared to prior, creatinine slightly elevated compared to prior at 1.5.  Mild hypokalemia of 3.2.   Imaging Studies: I ordered imaging studies including chest x-ray. I independently visualized and interpreted imaging which showed no acute cardiopulmonary abnormalities. I agree with the radiologist interpretation.   Medications: I ordered medication including IV fluids, potassium replacement, Zofran for hypokalemia, likely dehydration, and nausea. Reevaluation of the patient after these medicines showed that the patient improved. I have reviewed the patients home medicines and have made adjustments as needed.   Dispostion: Patient discussed and care transferred to attending physician Dr. Billy Fischer at shift change, see her note for disposition.  I anticipate that patient will likely not require admission or inpatient treatment for his symptoms.  At the time of handoff patient has EKG, troponin level and COVID/flu testing pending.  If these tests are grossly normal, anticipate patient can be discharged home with close follow-up with his PCP and symptomatic management of his nausea.  It is likely his symptoms are related to GI upset from his new antibiotic, or his continued recovery from previously diagnosed pneumonia.  Final Clinical Impression(s) / ED Diagnoses Final diagnoses:  Nausea and vomiting in adult    Rx / DC Orders ED Discharge Orders     None      Portions of this report may have been transcribed using voice recognition software. Every effort was made  to ensure accuracy; however, inadvertent computerized transcription errors may be present.    Estill Cotta 08/01/21 1850    Gareth Morgan, MD 08/01/21 [phone]

## 2021-08-01 NOTE — ED Provider Triage Note (Signed)
Emergency Medicine Provider Triage Evaluation Note  Andrew Arnold , a 66 y.o. male  was evaluated in triage.  Pt complains of short of breath and vomiting Pt being treated for pneumonia  Review of Systems  Positive: fever Negative: Chest pain  Physical Exam  BP (!) 156/87 (BP Location: Left Arm)    Pulse 96    Temp 98.4 F (36.9 C) (Oral)    Resp 20    SpO2 99%  Gen:   Awake, no distress  Resp:  Normal effort  MSK:   Moves extremities without difficulty  Other:    Medical Decision Making  Medically screening exam initiated at 2:16 PM.  Appropriate orders placed.  CAREY JOHNDROW was informed that the remainder of the evaluation will be completed by another provider, this initial triage assessment does not replace that evaluation, and the importance of remaining in the ED until their evaluation is complete.     Fransico Meadow, [place] 08/01/21 1417

## 2021-08-01 NOTE — ED Notes (Signed)
No ultra sound IV trained RN available at this time. Still waiting on IV team consult to arrive.

## 2021-08-02 ENCOUNTER — Observation Stay (HOSPITAL_COMMUNITY): Payer: BC Managed Care – PPO

## 2021-08-02 DIAGNOSIS — I10 Essential (primary) hypertension: Secondary | ICD-10-CM | POA: Diagnosis present

## 2021-08-02 DIAGNOSIS — J189 Pneumonia, unspecified organism: Secondary | ICD-10-CM | POA: Diagnosis present

## 2021-08-02 DIAGNOSIS — R109 Unspecified abdominal pain: Secondary | ICD-10-CM | POA: Diagnosis not present

## 2021-08-02 DIAGNOSIS — I700 Atherosclerosis of aorta: Secondary | ICD-10-CM | POA: Diagnosis not present

## 2021-08-02 DIAGNOSIS — Z833 Family history of diabetes mellitus: Secondary | ICD-10-CM | POA: Diagnosis not present

## 2021-08-02 DIAGNOSIS — I951 Orthostatic hypotension: Secondary | ICD-10-CM | POA: Diagnosis present

## 2021-08-02 DIAGNOSIS — E119 Type 2 diabetes mellitus without complications: Secondary | ICD-10-CM | POA: Diagnosis present

## 2021-08-02 DIAGNOSIS — N400 Enlarged prostate without lower urinary tract symptoms: Secondary | ICD-10-CM | POA: Diagnosis present

## 2021-08-02 DIAGNOSIS — Z79899 Other long term (current) drug therapy: Secondary | ICD-10-CM | POA: Diagnosis not present

## 2021-08-02 DIAGNOSIS — E860 Dehydration: Secondary | ICD-10-CM | POA: Diagnosis present

## 2021-08-02 DIAGNOSIS — E876 Hypokalemia: Secondary | ICD-10-CM | POA: Diagnosis present

## 2021-08-02 DIAGNOSIS — R918 Other nonspecific abnormal finding of lung field: Secondary | ICD-10-CM | POA: Diagnosis present

## 2021-08-02 DIAGNOSIS — I2510 Atherosclerotic heart disease of native coronary artery without angina pectoris: Secondary | ICD-10-CM | POA: Diagnosis present

## 2021-08-02 DIAGNOSIS — Z7984 Long term (current) use of oral hypoglycemic drugs: Secondary | ICD-10-CM | POA: Diagnosis not present

## 2021-08-02 DIAGNOSIS — N179 Acute kidney failure, unspecified: Secondary | ICD-10-CM | POA: Diagnosis present

## 2021-08-02 DIAGNOSIS — J301 Allergic rhinitis due to pollen: Secondary | ICD-10-CM | POA: Diagnosis present

## 2021-08-02 DIAGNOSIS — E785 Hyperlipidemia, unspecified: Secondary | ICD-10-CM | POA: Diagnosis present

## 2021-08-02 DIAGNOSIS — R112 Nausea with vomiting, unspecified: Secondary | ICD-10-CM | POA: Diagnosis not present

## 2021-08-02 DIAGNOSIS — Z20822 Contact with and (suspected) exposure to covid-19: Secondary | ICD-10-CM | POA: Diagnosis present

## 2021-08-02 DIAGNOSIS — I447 Left bundle-branch block, unspecified: Secondary | ICD-10-CM | POA: Diagnosis present

## 2021-08-02 DIAGNOSIS — Z981 Arthrodesis status: Secondary | ICD-10-CM | POA: Diagnosis not present

## 2021-08-02 LAB — RESPIRATORY PANEL BY PCR

## 2021-08-02 LAB — BASIC METABOLIC PANEL
Anion gap: 14 (ref 5–15)
BUN: 8 mg/dL (ref 8–23)
CO2: 21 mmol/L — ABNORMAL LOW (ref 22–32)
Calcium: 8.4 mg/dL — ABNORMAL LOW (ref 8.9–10.3)
Chloride: 105 mmol/L (ref 98–111)
Creatinine, Ser: 1.51 mg/dL — ABNORMAL HIGH (ref 0.61–1.24)
GFR, Estimated: 50 mL/min — ABNORMAL LOW (ref >60)
Glucose, Bld: 119 mg/dL — ABNORMAL HIGH (ref 70–99)
Potassium: 3.6 mmol/L (ref 3.5–5.1)
Sodium: 140 mmol/L (ref 135–145)

## 2021-08-02 LAB — BRAIN NATRIURETIC PEPTIDE: B Natriuretic Peptide: 74.3 pg/mL (ref 0.0–100.0)

## 2021-08-02 LAB — CBC
HCT: 33.2 % — ABNORMAL LOW (ref 39.0–52.0)
Hemoglobin: 11 g/dL — ABNORMAL LOW (ref 13.0–17.0)
MCH: 33 pg (ref 26.0–34.0)
MCHC: 33.1 g/dL (ref 30.0–36.0)
MCV: 99.7 fL (ref 80.0–100.0)
Platelets: 252 10*3/uL (ref 150–400)
RBC: 3.33 MIL/uL — ABNORMAL LOW (ref 4.22–5.81)
RDW: 13.4 % (ref 11.5–15.5)
WBC: 5.8 10*3/uL (ref 4.0–10.5)
nRBC: 0 % (ref 0.0–0.2)

## 2021-08-02 LAB — TROPONIN I (HIGH SENSITIVITY)
Troponin I (High Sensitivity): 28 ng/L — ABNORMAL HIGH (ref <18)
Troponin I (High Sensitivity): 24 ng/L — ABNORMAL HIGH (ref <18)

## 2021-08-02 LAB — GLUCOSE, CAPILLARY
Glucose-Capillary: 99 mg/dL (ref 70–99)
Glucose-Capillary: 111 mg/dL — ABNORMAL HIGH (ref 70–99)
Glucose-Capillary: 123 mg/dL — ABNORMAL HIGH (ref 70–99)
Glucose-Capillary: 89 mg/dL (ref 70–99)
Glucose-Capillary: 114 mg/dL — ABNORMAL HIGH (ref 70–99)
Glucose-Capillary: 92 mg/dL (ref 70–99)

## 2021-08-02 LAB — LACTIC ACID, PLASMA
Lactic Acid, Venous: 1 mmol/L (ref 0.5–1.9)
Lactic Acid, Venous: 0.8 mmol/L (ref 0.5–1.9)

## 2021-08-02 LAB — CBG MONITORING, ED: Glucose-Capillary: 89 mg/dL (ref 70–99)

## 2021-08-02 LAB — PROCALCITONIN: Procalcitonin: <0.1 ng/mL

## 2021-08-02 LAB — LIPASE, BLOOD: Lipase: 23 U/L (ref 11–51)

## 2021-08-02 LAB — HIV ANTIBODY (ROUTINE TESTING W REFLEX): HIV Screen 4th Generation wRfx: NONREACTIVE

## 2021-08-02 IMAGING — CT CT ABD-PELV W/O CM
2 of 4 series · 16 of 46 positions shown, 18 images · non-contrast
Comparison: CT chest angiogram, [DATE]

CLINICAL DATA: Abdominal pain and vomiting



[Series 3: abd/ pelvis 5.0 i30f 2 · axial · 0.90mm/px · z∈[+783,+1228]mm · 13 of 97 slices shown, 15 images]
[im 4/97  soft-tissue]
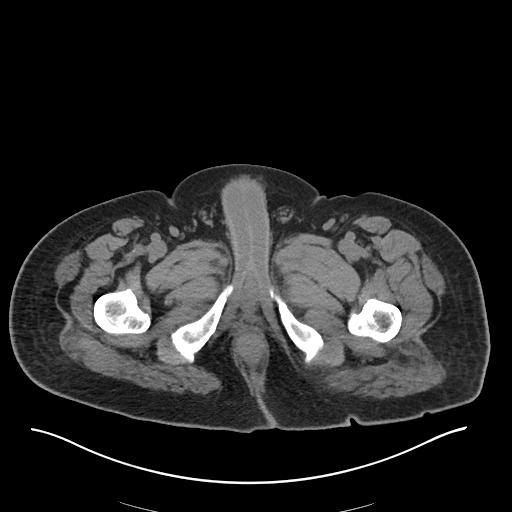
[im 4/97  bone]
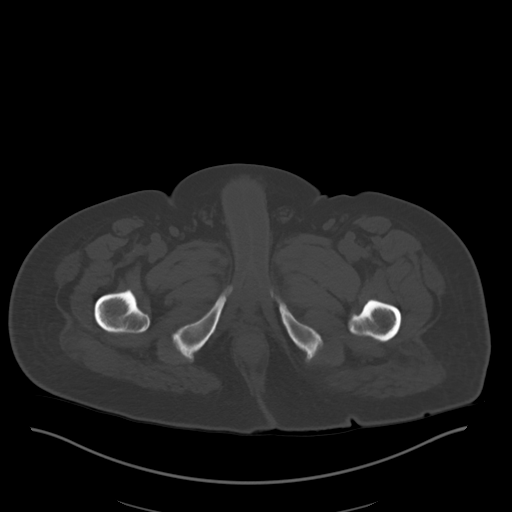
[im 12/97  soft-tissue]
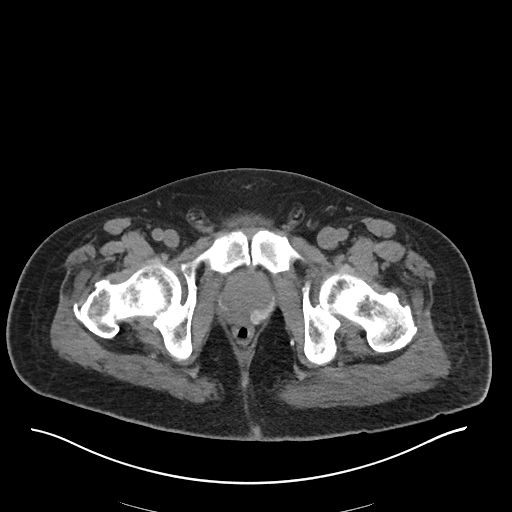
[im 19/97  soft-tissue]
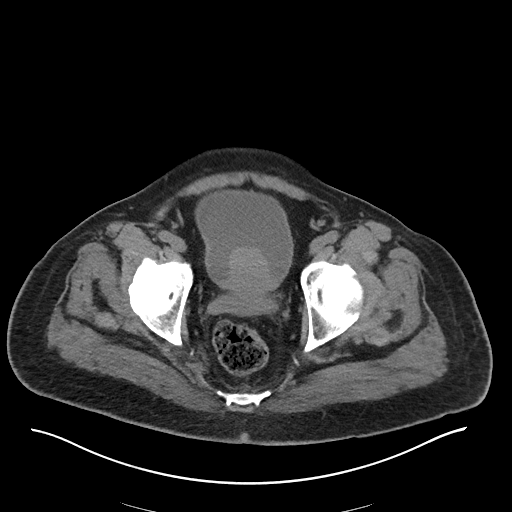
[im 26/97  soft-tissue]
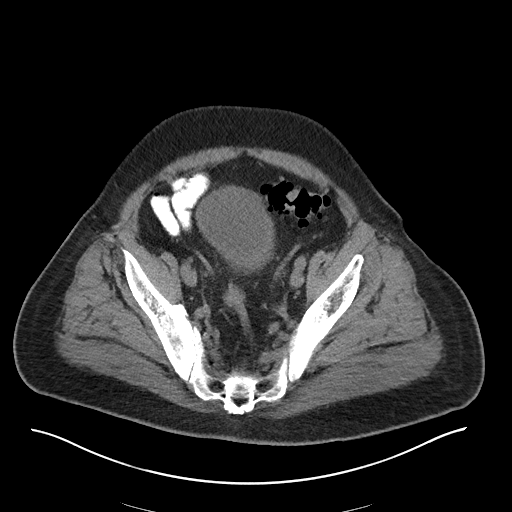
[im 34/97  soft-tissue]
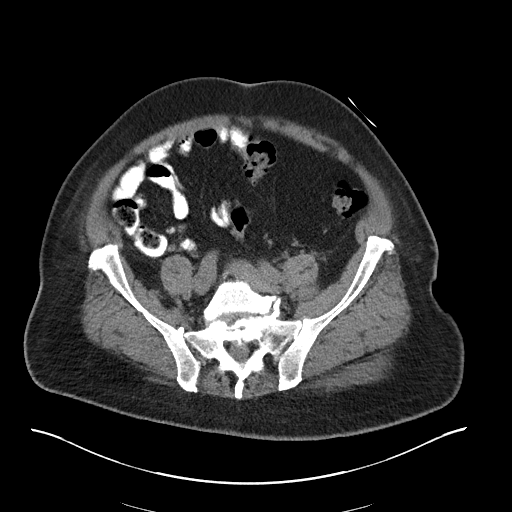
[im 41/97  soft-tissue]
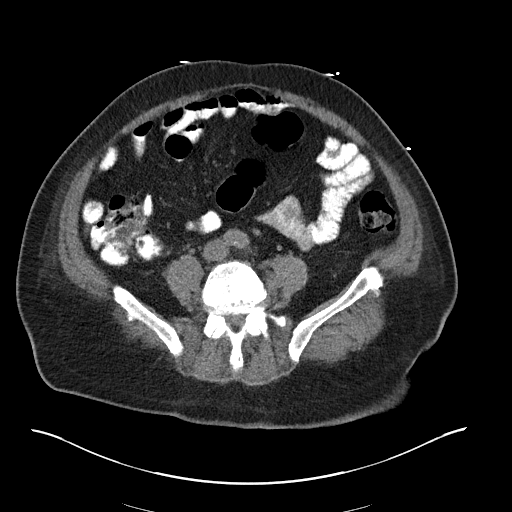
[im 49/97  soft-tissue]
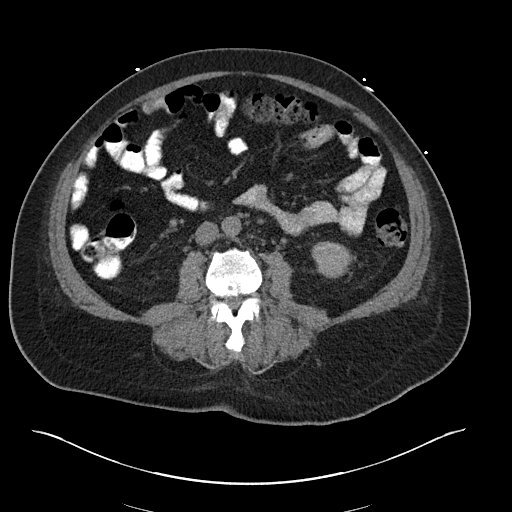
[im 56/97  soft-tissue]
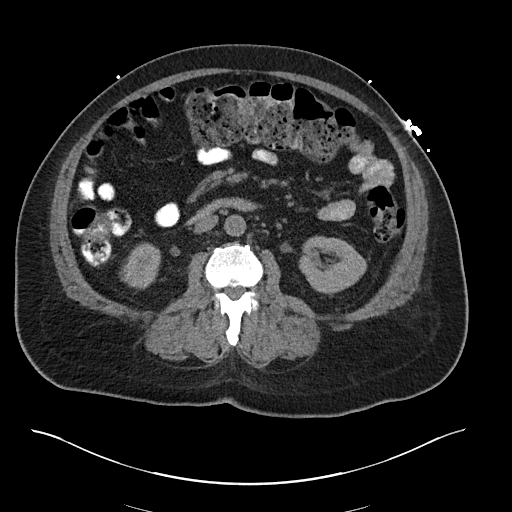
[im 63/97  soft-tissue]
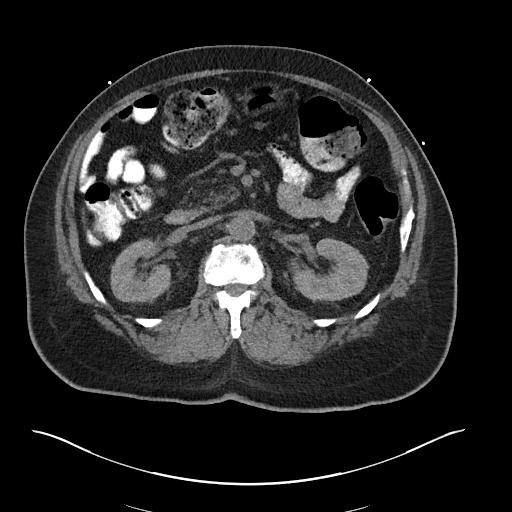
[im 63/97  bone]
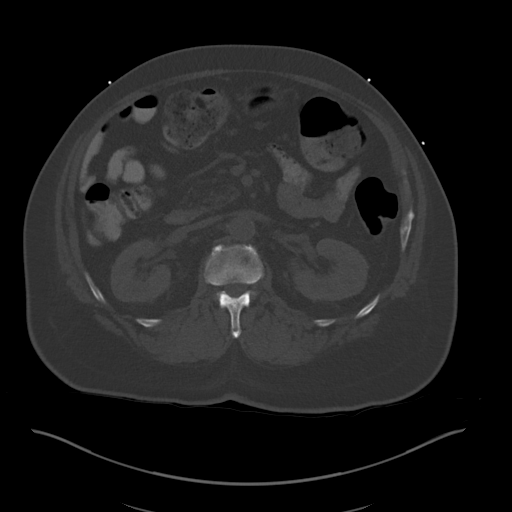
[im 71/97  soft-tissue]
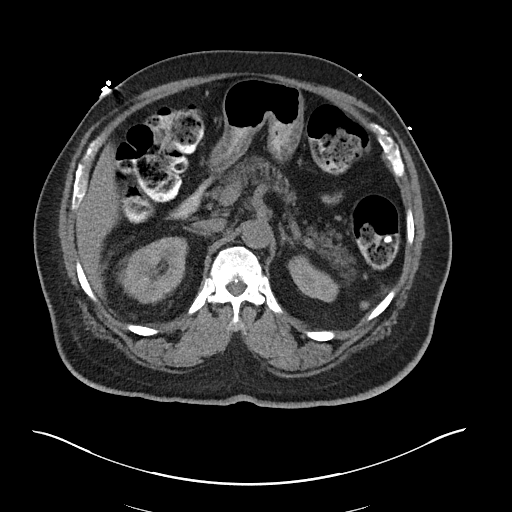
[im 78/97  soft-tissue]
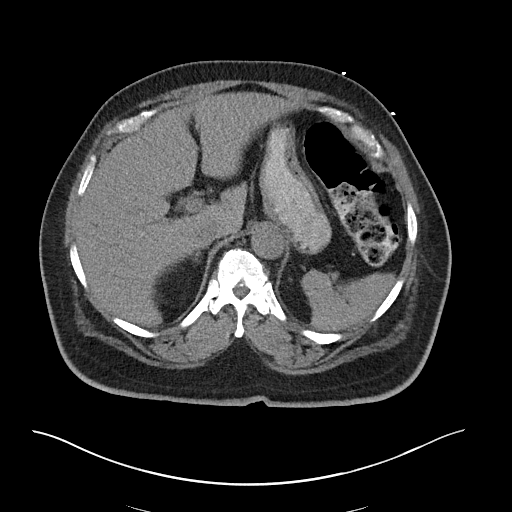
[im 85/97  soft-tissue]
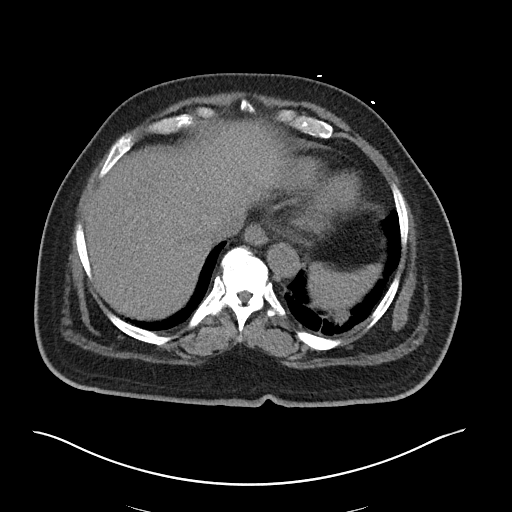
[im 93/97  soft-tissue]
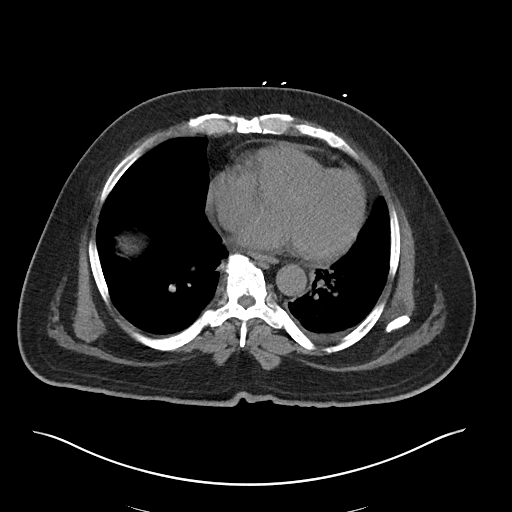

[Series 6: cor st · coronal · 0.87mm/px · 3 of 116 slices shown]
[im 39/116  soft-tissue]
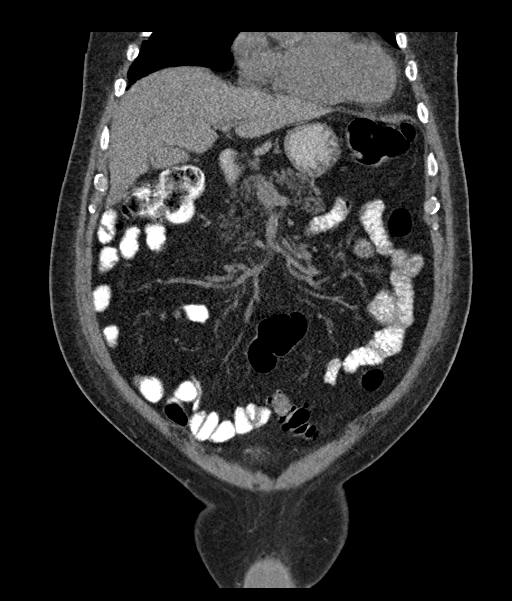
[im 52/116  soft-tissue]
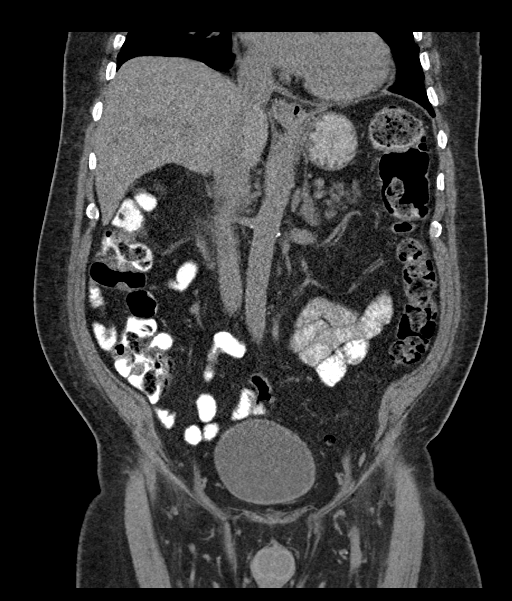
[im 64/116  soft-tissue]
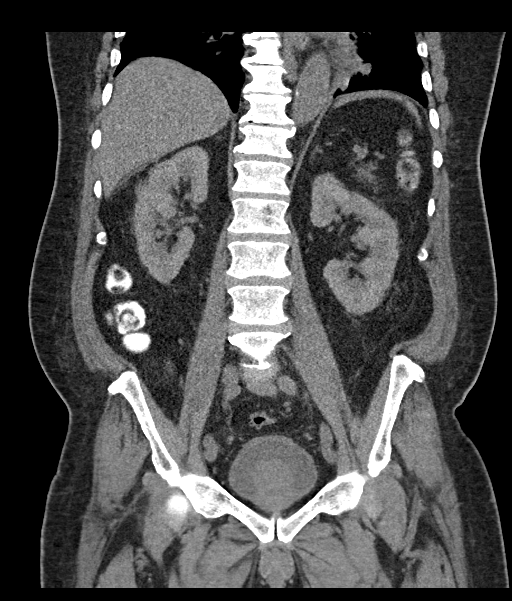

[16 of 46 positions shown; findings below may reference images not displayed]

FINDINGS: Lower chest: Trace left pleural effusion. Multiple pulmonary nodules
in the left lung base measuring up to 1.7 cm, as seen on prior
dedicated examination of the chest (series 5, image 12). Small
hiatal hernia.

Hepatobiliary: No solid liver abnormality is seen. No gallstones,
gallbladder wall thickening, or biliary dilatation.

Pancreas: Unremarkable. No pancreatic ductal dilatation or
surrounding inflammatory changes.

Spleen: Normal in size without significant abnormality.

Adrenals/Urinary Tract: Adrenal glands are unremarkable. Kidneys are
normal, without renal calculi, solid lesion, or hydronephrosis.
Bladder is unremarkable.

Stomach/Bowel: Stomach is within normal limits. Appendix appears
normal. No evidence of bowel wall thickening, distention, or
inflammatory changes. Descending and sigmoid diverticulosis.

Vascular/Lymphatic: Aortic atherosclerosis. No enlarged abdominal or
pelvic lymph nodes.

Reproductive: Prostatomegaly and median lobe hypertrophy.

Other: No abdominal wall hernia or abnormality. No ascites.

Musculoskeletal: No acute or significant osseous findings.
IMPRESSION: 1. No acute noncontrast CT findings of the abdomen or pelvis to
explain pain.
2. Descending and sigmoid diverticulosis without evidence of acute
diverticulitis.
3. Prostatomegaly.
4. Multiple pulmonary nodules in the left lung base measuring up to
1.7 cm, as seen on prior dedicated examination of the chest. As
previously reported, these are nonspecific, potentially infectious
or inflammatory, however malignancy is not excluded.

Aortic Atherosclerosis ([TW]-[TW]).

## 2021-08-02 MED ORDER — PANTOPRAZOLE SODIUM 40 MG PO TBEC
40.0000 mg | DELAYED_RELEASE_TABLET | Freq: Every day | ORAL | Status: DC
  Administered 2021-08-02 – 2021-08-05 (×4): 40 mg via ORAL
  Filled 2021-08-02 (×4): qty 1

## 2021-08-02 MED ORDER — LACTATED RINGERS IV BOLUS
1000.0000 mL | Freq: Once | INTRAVENOUS | Status: AC
  Administered 2021-08-02: 1000 mL via INTRAVENOUS

## 2021-08-02 MED ORDER — LACTATED RINGERS IV SOLN: INTRAVENOUS | Status: DC

## 2021-08-02 NOTE — Evaluation (Signed)
Physical Therapy Evaluation Patient Details Name: Andrew Arnold MRN: [id number] DOB: 07-Aug-1955 Today's Date: 08/02/2021  History of Present Illness  66 y.o. male  in ED on 1/18 with pleuritic CP (L sided CP worse with inspiration, unaffected by movement or activity). Diagnosed pna and discharged. Return to ED 08/01/21 with incr generalized weakness, N/V. COVID and flu negative  PMH significant of DM2, HTN, HLD, non-occlusive CAD, ACDF (pt reports RUE and RLE weakness ever since surgery).  Clinical Impression   Pt admitted secondary to problem above with deficits below. PTA patient was walking with cane due to Rt sided weakness (after ACDF per pt).  Pt currently unable to tolerate standing due to symptomatic orthostasis in sitting (see BPs below; RN to notify MD). Will need to continue to assess needs as BP allows incr mobility.  Anticipate patient will benefit from PT to address problems listed below.Will continue to follow acutely to maximize functional mobility independence and safety.      Supine 121/70 Sitting  97/56 lightheaded and less responsive/eyes closing Supine 98/45 5 deg trendelenburg 101/44 *RN arrived and pt no longer symptomatic Supine 92/54         Recommendations for follow up therapy are one component of a multi-disciplinary discharge planning process, led by the attending physician.  Recommendations may be updated based on patient status, additional functional criteria and insurance authorization.  Follow Up Recommendations Other (comment) (TBD as able to further assess mobility (when BP allows))    Assistance Recommended at Discharge Frequent or constant Supervision/Assistance  Patient can return home with the following  Two people to help with walking and/or transfers;Two people to help with bathing/dressing/bathroom;Assistance with cooking/housework;Assistance with feeding;Assist for transportation;Help with stairs or ramp for entrance    Equipment  Recommendations Other (comment) (TBD as able to progress mobility)  Recommendations for Other Services       Functional Status Assessment Patient has had a recent decline in their functional status and demonstrates the ability to make significant improvements in function in a reasonable and predictable amount of time.     Precautions / Restrictions Precautions Precautions: Fall;Other (comment) Precaution Comments: orthostasis      Mobility  Bed Mobility Overal bed mobility: Needs Assistance Bed Mobility: Rolling, Sidelying to Sit, Sit to Supine Rolling: Modified independent (Device/Increase time) (with rail) Sidelying to sit: Min guard, HOB elevated   Sit to supine: Min assist   General bed mobility comments: up to edge with minguard due to reported lightheadedness; return to supine assist with raising legs    Transfers                   General transfer comment: unsafe to attmept due to orthostasis    Ambulation/Gait                  Stairs            Wheelchair Mobility    Modified Rankin (Stroke Patients Only)       Balance                                             Pertinent Vitals/Pain Pain Assessment Pain Assessment: No/denies pain    Home Living Family/patient expects to be discharged to:: Private residence Living Arrangements: Other (Comment) (brother in law) Available Help at Discharge: Family;Available 24 hours/day Type of Home: House Home Access: Stairs  to enter Entrance Stairs-Rails: Right;Left;Can reach both Entrance Stairs-Number of Steps: 5   Home Layout: One level Home Equipment: Cane - single point;Rolling Walker (2 wheels);BSC/3in1      Prior Function Prior Level of Function : Independent/Modified Independent;Working/employed             Mobility Comments: uses SPC at baseline 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 [place]: Left     Extremity/Trunk Assessment   Upper Extremity Assessment Upper Extremity Assessment: Defer to OT evaluation (noted RUE weaker than LUE)    Lower Extremity Assessment Lower Extremity Assessment: RLE deficits/detail RLE Deficits / Details: hip flexion 3, knee extension 3-, ankle DF 3-    Cervical / Trunk Assessment Cervical / Trunk Assessment: Other exceptions Cervical / Trunk Exceptions: overweight  Communication   Communication: No difficulties  Cognition Arousal/Alertness: Lethargic Behavior During Therapy: Flat affect Overall Cognitive Status: Within Functional Limits for tasks assessed                                 General Comments: oriented x 4        General Comments General comments (skin integrity, edema, etc.): Became dizzy at EOB and BP dropped (see above). Did not recover significantly when returned to supine and RN made aware. She is contacting MD    Exercises     Assessment/Plan    PT Assessment Patient needs continued PT services  PT Problem List Decreased strength;Decreased activity tolerance;Decreased mobility;Cardiopulmonary status limiting activity;Obesity       PT Treatment Interventions DME instruction;Gait training;Stair training;Functional mobility training;Therapeutic activities;Therapeutic exercise;Balance training;Patient/family education    PT Goals (Current goals can be found in the Care Plan section)  Acute Rehab PT Goals Patient Stated Goal: feel better PT Goal Formulation: With patient Time For Goal Achievement: 08/16/21 Potential to Achieve Goals: Good    Frequency Min 3X/week     Co-evaluation               AM-PAC PT "6 Clicks" Mobility  Outcome Measure Help needed turning from your back to your side while in a flat bed without using bedrails?: A Little Help needed moving from lying on your back to sitting on the side of a flat bed without using bedrails?: A Little Help needed moving to and from a bed to a  chair (including a wheelchair)?: Total Help needed standing up from a chair using your arms (e.g., wheelchair or bedside chair)?: Total Help needed to walk in hospital room?: Total Help needed climbing 3-5 steps with a railing? : Total 6 Click Score: 10    End of Session   Activity Tolerance: Treatment limited secondary to medical complications (Comment) (orthostasis) Patient left: in bed;with call bell/phone within reach;with bed alarm set;with family/visitor present Nurse Communication: Other (comment) (orthostasis; low bP) PT Visit Diagnosis: Difficulty in walking, not elsewhere classified (R26.2)    Time: 1610-9604 PT Time Calculation (min) (ACUTE ONLY): 25 min   Charges:   PT Evaluation $PT Eval Moderate Complexity: [place], [place]  Pager [phone] Office [phone]   Rexanne Mano 08/02/2021, 10:17 AM

## 2021-08-02 NOTE — Progress Notes (Addendum)
PT notified nurse regarding bp lying 121/70, sitting 97/56 took it again sitting 95/54. Pt symptomatic said he felt dizzy when sitting. Nurse and PT put pt in reverse tren bp 101/64 (60). Says he doesn't feel dizzy anymore. MD Candiss Norse notified, gave new order to d/c clonidine, give LR 545ml/hr bolus, and start LR 51ml/hr, and thigh high ted hose

## 2021-08-02 NOTE — Plan of Care (Signed)
Pt arrived via stretcher from ER. Able to walk from stretcher in hallway to bed in room with walker; gait steady. Vitals obtained and pt assessed. See flowsheets for details. Pt oriented to staff and room. Brother in law at bedside and staying the night.    Problem: Activity: Goal: Ability to tolerate increased activity will improve Outcome: Progressing   Problem: Clinical Measurements: Goal: Ability to maintain a body temperature in the normal range will improve Outcome: Progressing   Problem: Respiratory: Goal: Ability to maintain adequate ventilation will improve Outcome: Progressing Goal: Ability to maintain a clear airway will improve Outcome: Progressing

## 2021-08-02 NOTE — Progress Notes (Signed)
PROGRESS NOTE                                                                                                                                                                                                             Patient Demographics:    Andrew Arnold, is a 66 y.o. male, DOB - [birth year], DGU:[id number]  Outpatient Primary MD for the patient is [name], MD    LOS - 0  Admit date - 08/01/2021    Chief Complaint  Patient presents with   Emesis       Brief Narrative (HPI from H&P)   Andrew Arnold is a 66 y.o. male with medical history significant of DM2, HTN, HLD, non-occlusive CAD (LHC 05/23/21).  Presented to the hospital with chief complaints of nausea vomiting some cough shortness of breath and low-grade fevers.  In the ER he was diagnosed with pneumonia along with gastroenteritis and admitted to the hospital.   Subjective:    Andrew Arnold today has, No headache, No chest pain, No abdominal pain - No Nausea, No new weakness tingling or numbness, no SOB.   Assessment  & Plan :    No notes have been filed under this hospital service. Service: Hospitalist    Nausea vomiting along with suspicion for aspiration pneumonia - no reason for his nausea vomiting, his last colonoscopy was 2015 he also has 15 to 20 pound unintentional weight loss.  Will check CT abdomen pelvis with oral contrast in the light of his renal failure, continue empiric antibiotics for possible pneumonia.  Outpatient GI and pulmonary follow-up most likely.  2.  Nodular lung densities.  Could be malignancy.  Outpatient pulmonary follow-up repeat CT and biopsy if needed.  3.  Severe dehydration, AKI, orthostatic hypotension.  Aggressively hydrate with IV fluids.  Hold diuretics and Entresto.  4.  Hypertension.  Blood pressure soft all blood pressure medications have been discontinued, hydrate and monitor.  5.  Deconditioning.  PT  OT and monitor.  6.  Dyslipidemia.  On statin  7.  BPH.  On Flomax.         Condition - Extremely Guarded  Family Communication  :  brother in law  Code Status :  Full  Consults  :    PUD Prophylaxis :    Procedures  :  CTA - There is no evidence of pulmonary artery embolism. There are multiple new patchy nodular densities in the left lower lobe measuring up to 2.5 cm in size suggesting pneumonia. Possibility of 1 of these nodules being neoplastic process is not excluded. Short-term follow-up CT in 2-3 weeks after medical treatment for pneumonia should be considered. 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 There is 7 mm right renal stone.  Small hiatal hernia.   TTE Nov 2022 -    1. Left ventricular ejection fraction, by estimation, is 65 to 70%. The left ventricle has normal function. The left ventricle has no regional wall motion abnormalities. There is severe asymmetric left ventricular hypertrophy of the septal segment. Left  ventricular diastolic parameters are consistent with Grade II diastolic dysfunction (pseudonormalization). Elevated left ventricular end-diastolic pressure.   2. Right ventricular systolic function is normal. The right ventricular size is normal. There is normal pulmonary artery systolic pressure. The estimated right ventricular systolic pressure is 13.2 mmHg.   3. There is a trivial pericardial effusion posterior to the left ventricle.   4. The mitral valve is grossly normal. Mild mitral valve regurgitation.   5. The aortic valve is tricuspid. Aortic valve regurgitation is not visualized. No aortic stenosis is present. Aortic valve mean gradient measures 6.0 mmHg.   6. The inferior vena cava is normal in size with greater than 50% respiratory variability, suggesting right atrial pressure of 3 mmHg.      Disposition Plan  :    Status is: Observation  DVT Prophylaxis  :    Place TED hose Start: 08/02/21  1008 enoxaparin (LOVENOX) injection 40 mg Start: 08/01/21 2200     Lab Results  Component Value Date   PLT 252 08/02/2021    Diet :  Diet Order             Diet clear liquid Room service appropriate? Yes; Fluid consistency: Thin  Diet effective now                    Inpatient Medications  Scheduled Meds:  atorvastatin  80 mg Oral QHS   enoxaparin (LOVENOX) injection  40 mg Subcutaneous Q24H   insulin aspart  0-9 Units Subcutaneous Q4H   tamsulosin  0.4 mg Oral QPM   Continuous Infusions:  azithromycin Stopped (08/02/21 0130)   cefTRIAXone (ROCEPHIN)  IV     lactated ringers     PRN Meds:.  Antibiotics  :    Anti-infectives (From admission, onward)    Start     Dose/Rate Route Frequency Ordered Stop   08/02/21 2000  cefTRIAXone (ROCEPHIN) 2 g in sodium chloride 0.9 % 100 mL IVPB        2 g 200 mL/hr over 30 Minutes Intravenous Every 24 hours 08/01/21 2125     08/01/21 2045  azithromycin (ZITHROMAX) 500 mg in sodium chloride 0.9 % 250 mL IVPB        500 mg 250 mL/hr over 60 Minutes Intravenous Every 24 hours 08/01/21 2044     08/01/21 2045  cefTRIAXone (ROCEPHIN) 1 g in sodium chloride 0.9 % 100 mL IVPB        1 g 200 mL/hr over 30 Minutes Intravenous  Once 08/01/21 2044 08/02/21 0008        Time Spent in minutes  30   Lala Lund M.D on 08/02/2021 at 10:24 AM  To page go to [email or web address]   [facility] -  Office  [phone]  See all Orders from today for further details    Objective:   Vitals:   08/02/21 0141 08/02/21 0515 08/02/21 0747 08/02/21 0800  BP: (!) 113/57 119/66 (!) 73/32 (!) 108/55  Pulse: 92 89 70 76  Resp: 18 16 15 17   Temp: 97.8 F (36.6 C)  98.9 F (37.2 C)   TempSrc: Oral  Oral   SpO2: 95% 98% 96%     Wt Readings from Last 3 Encounters:  07/31/21 103.7 kg  06/11/21 (P) 110 kg  06/03/21 113.4 kg     Intake/Output Summary (Last 24 hours) at 08/02/2021 1024 Last data filed at 08/02/2021 0843 Gross per 24  hour  Intake 475.41 ml  Output 275 ml  Net 200.41 ml     Physical Exam  Awake Alert, No new F.N deficits, Normal affect [place].AT,PERRAL Supple Neck, No JVD,   Symmetrical Chest wall movement, Good air movement bilaterally, CTAB RRR,No Gallops,Rubs or new Murmurs,  +ve B.Sounds, Abd Soft, No tenderness,   No Cyanosis, Clubbing or edema       Data Review:    CBC Recent Labs  Lab 07/30/21 1428 08/01/21 1434 08/02/21 0436  WBC 5.5 6.0 5.8  HGB 11.8* 12.7* 11.0*  HCT 36.6* 39.4 33.2*  PLT 308 325 252  MCV 100.5* 100.5* 99.7  MCH 32.4 32.4 33.0  MCHC 32.2 32.2 33.1  RDW 13.2 13.4 13.4  LYMPHSABS  --  1.4  --   MONOABS  --  0.6  --   EOSABS  --  0.1  --   BASOSABS  --  0.0  --     Electrolytes Recent Labs  Lab 07/30/21 1428 07/30/21 1441 08/01/21 1434 08/01/21 2318 08/02/21 0436 08/02/21 0453  NA 138  --  139  --  140  --   K 3.3*  --  3.2*  --  3.6  --   CL 99  --  101  --  105  --   CO2 21*  --  22  --  21*  --   GLUCOSE 88  --  102*  --  119*  --   BUN 8  --  9  --  8  --   CREATININE 1.21  --  1.50*  --  1.51*  --   CALCIUM 9.6  --  9.6  --  8.4*  --   AST  --  14* 15  --   --   --   ALT  --  12 13  --   --   --   ALKPHOS  --  86 88  --   --   --   BILITOT  --  1.5* 0.9  --   --   --   ALBUMIN  --  3.6 3.8  --   --   --   PROCALCITON  --   --   --   --   --  <0.10  LATICACIDVEN  --   --   --  1.0 0.8  --   BNP  --  46.1  --   --  74.3  --     ------------------------------------------------------------------------------------------------------------------ No results for input(s): CHOL, HDL, LDLCALC, TRIG, CHOLHDL, LDLDIRECT in the last 72 hours.  Lab Results  Component Value Date   HGBA1C 6.7 (H) 06/02/2021    No results for input(s): TSH, T4TOTAL, T3FREE, THYROIDAB in the last 72 hours.  Invalid input(s): FREET3 ------------------------------------------------------------------------------------------------------------------ ID Labs Recent  Labs  Lab 07/30/21  1428 08/01/21 1434 08/01/21 2318 08/02/21 0436 08/02/21 0453  WBC 5.5 6.0  --  5.8  --   PLT 308 325  --  252  --   PROCALCITON  --   --   --   --  <0.10  LATICACIDVEN  --   --  1.0 0.8  --   CREATININE 1.21 1.50*  --  1.51*  --    Cardiac Enzymes No results for input(s): CKMB, TROPONINI, MYOGLOBIN in the last 168 hours.  Invalid input(s): CK   Radiology Reports DG Chest 2 View  Result Date: 08/01/2021 CLINICAL DATA:  Shortness of breath. EXAM: CHEST - 2 VIEW COMPARISON:  CT a chest dated July 30, 2021. Chest x-ray dated August 30, 2017. FINDINGS: Unchanged mild cardiomegaly. Normal pulmonary vascularity. Subtle patchy opacities in the left lower lobe, better evaluated on recent chest CT. No pleural effusion or pneumothorax. No acute osseous abnormality. IMPRESSION: 1. Left lower lobe pneumonia, better evaluated on recent chest CT. Electronically Signed   By: Titus Dubin M.D.   On: 08/01/2021 14:50   CT Angio Chest PE W and/or Wo Contrast  Result Date: 07/30/2021 CLINICAL DATA:  Chest pain EXAM: CT ANGIOGRAPHY CHEST WITH CONTRAST TECHNIQUE: Multidetector CT imaging of the chest was performed using the standard protocol during bolus administration of intravenous contrast. Multiplanar CT image reconstructions and MIPs were obtained to evaluate the vascular anatomy. RADIATION DOSE REDUCTION: This exam was performed according to the departmental dose-optimization program which includes automated exposure control, adjustment of the mA and/or kV according to patient size and/or use of iterative reconstruction technique. CONTRAST:  13mL OMNIPAQUE IOHEXOL 350 MG/ML SOLN COMPARISON:  08/29/2017 FINDINGS: Cardiovascular: Heart is enlarged in size. Scattered coronary artery calcifications are seen. Contrast density in the thoracic aorta is less than optimal. As far as seen, there is no definite demonstrable intimal flap. There is ectasia of ascending thoracic aorta  measuring 4.5 cm. There are no intraluminal filling defects in the pulmonary artery branches. Mediastinum/Nodes: No new significant lymphadenopathy seen. Lungs/Pleura: There are multiple new nodular densities in the left lower lobe largest measuring 2.5 cm in size. Many of these nodular densities are in the periphery of left lower lobe. Rest of the lung fields are clear. There is no significant pleural effusion or pneumothorax. Upper Abdomen: There is 7 mm calculus in the upper pole of right kidney. Small hiatal hernia is seen. Musculoskeletal: Degenerative changes are noted in the thoracic spine and visualized lower cervical spine. Review of the MIP images confirms the above findings. IMPRESSION: There is no evidence of pulmonary artery embolism. There are multiple new patchy nodular densities in the left lower lobe measuring up to 2.5 cm in size suggesting pneumonia. Possibility of 1 of these nodules being neoplastic process is not excluded. Short-term follow-up CT in 2-3 weeks after medical treatment for pneumonia should be considered. 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 There is 7 mm right renal stone.  Small hiatal hernia. Electronically Signed   By: Elmer Picker M.D.   On: 07/30/2021 16:39   DG Abd Portable 1V  Result Date: 08/02/2021 CLINICAL DATA:  66 year old male with nausea and vomiting. EXAM: PORTABLE ABDOMEN - 1 VIEW COMPARISON:  Chest radiographs yesterday. FINDINGS: Portable AP supine views at 0650 hours. Non obstructed bowel gas pattern. No pneumoperitoneum evident on these supine views. Streaky left lung base opacity persists. Abdominal and pelvic visceral contours appear normal. Chronic lower lumbar disc and endplate degeneration. No  acute osseous abnormality identified. IMPRESSION: Normal bowel gas pattern. Electronically Signed   By: Genevie Ann M.D.   On: 08/02/2021 07:31

## 2021-08-02 NOTE — Progress Notes (Signed)
Occupational Therapy Evaluation Patient Details Name: Andrew Arnold MRN: [id number] DOB: 1956-01-07 Today's Date: 08/02/2021   History of Present Illness 66 y.o. male  in ED on 1/18 with pleuritic CP (L sided CP worse with inspiration, unaffected by movement or activity). Diagnosed pna and discharged. Return to ED 08/01/21 with incr generalized weakness, N/V. COVID and flu negative  PMH significant of DM2, HTN, HLD, non-occlusive CAD, ACDF (pt reports RUE and RLE weakness ever since surgery).   Clinical Impression   Pt presents with above diagnosis. PTA pt PLOF living at home with family in 1 level house with 5 STE, reports use of SPC at baseline and mostly mod I with ADLs with use of AE and increased time. Still working and driving. Pt reports Pt is currently limited with safe ADL engagement due to pain, impaired strength, safety awareness, activity tolerance, syncope, and decreased functional mobility. Pt will benefit to continued skilled level OT in acute and SNF rehab vs HHOT for education of AE, compensatory strategies, safety awareness, HEP, and functional transfer to maximize independence prior to DC.         Recommendations for follow up therapy are one component of a multi-disciplinary discharge planning process, led by the attending physician.  Recommendations may be updated based on patient status, additional functional criteria and insurance authorization.   Follow Up Recommendations  Home health OT (vs. SNF, PT communication reports concerns for after PT session orthostatic BP. Will further assess with progression.)    Assistance Recommended at Discharge Set up Supervision/Assistance  Patient can return home with the following A little help with walking and/or transfers;Assistance with cooking/housework    Functional Status Assessment  Patient has had a recent decline in their functional status and demonstrates the ability to make significant improvements in function in a  reasonable and predictable amount of time.  Equipment Recommendations  Other (comment) (will further assess)    Recommendations for Other Services       Precautions / Restrictions Precautions Precautions: Fall;Other (comment) Precaution Comments: orthostasis      Mobility Bed Mobility Overal bed mobility: Needs Assistance Bed Mobility: Rolling, Sidelying to Sit, Sit to Supine Rolling: Modified independent (Device/Increase time) (with rail) Sidelying to sit: Min guard, HOB elevated   Sit to supine: Min assist   General bed mobility comments: return to supine assist with LE management    Transfers Overall transfer level: Needs assistance Equipment used: Rolling walker (2 wheels) Transfers: Sit to/from Stand Sit to Stand: Min guard, Min assist     General transfer comment: Pt seated EOB with BP level of 121/70 prior to standing. Min guard to min A for safety to power up to stand and lateral steps along EOB.      Balance Overall balance assessment: Needs assistance Sitting-balance support: Bilateral upper extremity supported, Feet supported Sitting balance-Leahy Scale: Fair     Standing balance support: Bilateral upper extremity supported Standing balance-Leahy Scale: Poor       ADL either performed or assessed with clinical judgement   ADL Overall ADL's : Needs assistance/impaired Eating/Feeding: Set up Eating/Feeding Details (indicate cue type and reason): Pt set up supine with HOB elevated for self feeding.   Grooming Details (indicate cue type and reason): Not foramlly tested but will infer independent with set up in sitting.         Upper Body Dressing : Set up Upper Body Dressing Details (indicate cue type and reason): Pt demonstrated donning of gown while seated EOB Lower Body  Dressing: Min guard Lower Body Dressing Details (indicate cue type and reason): Pt able to demonstrate figure 4 position while seated EOB to doff/don socks. Toilet Transfer: Armed forces operational officer Details (indicate cue type and reason): Session limited to sit to stand transition with RW to assess progressing for toilet transfer with lateral side step towards HOB.                 Vision Baseline Vision/History: 1 Wears glasses       Perception     Praxis      Pertinent Vitals/Pain Pain Assessment Pain Assessment: No/denies pain     Hand Dominance Left   Extremity/Trunk Assessment Upper Extremity Assessment Upper Extremity Assessment: Generalized weakness   Lower Extremity Assessment Lower Extremity Assessment: Defer to PT evaluation RLE Deficits / Details: hip flexion 3, knee extension 3-, ankle DF 3-   Cervical / Trunk Assessment Cervical / Trunk Assessment: Other exceptions Cervical / Trunk Exceptions: overweight   Communication Communication Communication: No difficulties   Cognition Arousal/Alertness: Lethargic Behavior During Therapy: Flat affect Overall Cognitive Status: Within Functional Limits for tasks assessed       General Comments: oriented x 4     General Comments              Home Living Family/patient expects to be discharged to:: Private residence Living Arrangements: Other (Comment) (brother in law) Available Help at Discharge: Family;Available 24 hours/day Type of Home: House Home Access: Stairs to enter [employer] of Steps: 5 Entrance Stairs-Rails: Right;Left;Can reach both Home Layout: One level     Bathroom Shower/Tub: Tub/shower unit;Other (comment);Sponge bathes at baseline   Bathroom Toilet: Standard Bathroom Accessibility: No   Home Equipment: Cane - single point;Rolling Walker (2 wheels);BSC/3in1          Prior Functioning/Environment Prior Level of Function : Independent/Modified Independent;Working/employed             Mobility Comments: uses SPC at baseline 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 [place]        OT Problem List: Decreased  strength;Decreased activity tolerance;Impaired balance (sitting and/or standing);Decreased safety awareness      OT Treatment/Interventions: Self-care/ADL training;Therapeutic exercise;DME and/or AE instruction;Therapeutic activities;Patient/family education;Balance training    OT Goals(Current goals can be found in the care plan section) Acute Rehab OT Goals Patient Stated Goal: No goal stated OT Goal Formulation: With patient Time For Goal Achievement: 08/16/21 Potential to Achieve Goals: Fair ADL Goals Pt Will Perform Grooming: with modified independence;standing;with adaptive equipment Pt Will Perform Lower Body Dressing: with modified independence;sit to/from stand;with adaptive equipment Pt Will Transfer to Toilet: with modified independence;bedside commode;ambulating Pt Will Perform Toileting - Clothing Manipulation and hygiene: with modified independence;sit to/from stand  OT Frequency: Min 2X/week    Co-evaluation              AM-PAC OT "6 Clicks" Daily Activity     Outcome Measure Help from another person eating meals?: None Help from another person taking care of personal grooming?: A Little Help from another person toileting, which includes using toliet, bedpan, or urinal?: A Little Help from another person bathing (including washing, rinsing, drying)?: A Lot Help from another person to put on and taking off regular upper body clothing?: A Little Help from another person to put on and taking off regular lower body clothing?: A Little 6 Click Score: 18   End of Session Equipment Utilized During Treatment: Gait belt;Rolling walker (2 wheels) Nurse Communication: Mobility  status  Activity Tolerance: Patient limited by lethargy Patient left: in bed;with call bell/phone within reach;with bed alarm set;with family/visitor present  OT Visit Diagnosis: Muscle weakness (generalized) (M62.81);Unsteadiness on feet (R26.81)                Time: 5277-8242 OT Time Calculation  (min): 23 min Charges:  OT General Charges $OT Visit: 1 Visit OT Evaluation $OT Eval Moderate Complexity: 1 Mod OT Treatments $Self Care/Home Management : 8-22 mins  Minus Breeding, MSOT, OTR/L  Supplemental Rehabilitation Services  [phone]   Marius Ditch 08/02/2021, 11:33 AM

## 2021-08-03 LAB — CBC WITH DIFFERENTIAL/PLATELET
Abs Immature Granulocytes: 0.02 10*3/uL (ref 0.00–0.07)
Basophils Absolute: 0 10*3/uL (ref 0.0–0.1)
Basophils Relative: 1 %
Eosinophils Absolute: 0.1 10*3/uL (ref 0.0–0.5)
Eosinophils Relative: 2 %
HCT: 32.1 % — ABNORMAL LOW (ref 39.0–52.0)
Hemoglobin: 10.8 g/dL — ABNORMAL LOW (ref 13.0–17.0)
Immature Granulocytes: 1 %
Lymphocytes Relative: 24 %
Lymphs Abs: 1.1 10*3/uL (ref 0.7–4.0)
MCH: 32.9 pg (ref 26.0–34.0)
MCHC: 33.6 g/dL (ref 30.0–36.0)
MCV: 97.9 fL (ref 80.0–100.0)
Monocytes Absolute: 0.5 10*3/uL (ref 0.1–1.0)
Monocytes Relative: 11 %
Neutro Abs: 2.7 10*3/uL (ref 1.7–7.7)
Neutrophils Relative %: 61 %
Platelets: 257 10*3/uL (ref 150–400)
RBC: 3.28 MIL/uL — ABNORMAL LOW (ref 4.22–5.81)
RDW: 13.1 % (ref 11.5–15.5)
WBC: 4.4 10*3/uL (ref 4.0–10.5)
nRBC: 0 % (ref 0.0–0.2)

## 2021-08-03 LAB — GLUCOSE, CAPILLARY
Glucose-Capillary: 102 mg/dL — ABNORMAL HIGH (ref 70–99)
Glucose-Capillary: 107 mg/dL — ABNORMAL HIGH (ref 70–99)
Glucose-Capillary: 107 mg/dL — ABNORMAL HIGH (ref 70–99)
Glucose-Capillary: 124 mg/dL — ABNORMAL HIGH (ref 70–99)
Glucose-Capillary: 97 mg/dL (ref 70–99)
Glucose-Capillary: 98 mg/dL (ref 70–99)

## 2021-08-03 LAB — MAGNESIUM: Magnesium: 1.6 mg/dL — ABNORMAL LOW (ref 1.7–2.4)

## 2021-08-03 LAB — COMPREHENSIVE METABOLIC PANEL
ALT: 11 U/L (ref 0–44)
AST: 14 U/L — ABNORMAL LOW (ref 15–41)
Albumin: 2.9 g/dL — ABNORMAL LOW (ref 3.5–5.0)
Alkaline Phosphatase: 85 U/L (ref 38–126)
Anion gap: 10 (ref 5–15)
BUN: 5 mg/dL — ABNORMAL LOW (ref 8–23)
CO2: 26 mmol/L (ref 22–32)
Calcium: 8.9 mg/dL (ref 8.9–10.3)
Chloride: 105 mmol/L (ref 98–111)
Creatinine, Ser: 1.08 mg/dL (ref 0.61–1.24)
GFR, Estimated: >60 mL/min (ref >60)
Glucose, Bld: 108 mg/dL — ABNORMAL HIGH (ref 70–99)
Potassium: 3.1 mmol/L — ABNORMAL LOW (ref 3.5–5.1)
Sodium: 141 mmol/L (ref 135–145)
Total Bilirubin: 0.9 mg/dL (ref 0.3–1.2)
Total Protein: 6 g/dL — ABNORMAL LOW (ref 6.5–8.1)

## 2021-08-03 LAB — C-REACTIVE PROTEIN: CRP: 0.8 mg/dL (ref <1.0)

## 2021-08-03 LAB — BRAIN NATRIURETIC PEPTIDE: B Natriuretic Peptide: 66 pg/mL (ref 0.0–100.0)

## 2021-08-03 LAB — PROCALCITONIN: Procalcitonin: <0.1 ng/mL

## 2021-08-03 MED ORDER — CARVEDILOL 3.125 MG PO TABS
3.1250 mg | ORAL_TABLET | Freq: Two times a day (BID) | ORAL | Status: DC
  Administered 2021-08-03 (×2): 3.125 mg via ORAL
  Filled 2021-08-03 (×2): qty 1

## 2021-08-03 MED ORDER — ASPIRIN 81 MG PO CHEW
81.0000 mg | CHEWABLE_TABLET | Freq: Every day | ORAL | Status: DC
  Administered 2021-08-03 – 2021-08-05 (×3): 81 mg via ORAL
  Filled 2021-08-03 (×3): qty 1

## 2021-08-03 MED ORDER — MAGNESIUM SULFATE 2 GM/50ML IV SOLN
2.0000 g | Freq: Once | INTRAVENOUS | Status: AC
  Administered 2021-08-03: 2 g via INTRAVENOUS
  Filled 2021-08-03: qty 50

## 2021-08-03 MED ORDER — POTASSIUM CHLORIDE CRYS ER 20 MEQ PO TBCR
40.0000 meq | EXTENDED_RELEASE_TABLET | Freq: Two times a day (BID) | ORAL | Status: AC
  Administered 2021-08-03 (×2): 40 meq via ORAL
  Filled 2021-08-03 (×2): qty 2

## 2021-08-03 MED ORDER — LACTATED RINGERS IV SOLN: INTRAVENOUS | Status: AC

## 2021-08-03 NOTE — Progress Notes (Signed)
Physical Therapy Treatment Patient Details Name: Andrew Arnold MRN: [id number] DOB: August 02, 1955 Today's Date: 08/03/2021   History of Present Illness 66 y.o. male  in ED on 1/18 with pleuritic CP (L sided CP worse with inspiration, unaffected by movement or activity). Diagnosed pna and discharged. Return to ED 08/01/21 with incr generalized weakness, N/V. COVID and flu negative  PMH significant of DM2, HTN, HLD, non-occlusive CAD, ACDF (pt reports RUE and RLE weakness ever since surgery).    PT Comments    Pt was able to progress with mobility today as his BP remained stable with no signs/symptoms of orthostatic hypotension with TED hose donned. Pt was able to ambulate ~260 ft with a RW with min guard assist, but does display deficits in lower extremity strength and balance. Pt reports he was ambulating without an AD independently in October/November of 2022. Recommending pt follow-up with OP PT as he is very motivated to return to being IND without an AD again. Will continue to follow acutely.   BP:  182/97 sitting 182/98 standing 169/94 sitting after gait bout     Recommendations for follow up therapy are one component of a multi-disciplinary discharge planning process, led by the attending physician.  Recommendations may be updated based on patient status, additional functional criteria and insurance authorization.  Follow Up Recommendations  Outpatient PT     Assistance Recommended at Discharge Intermittent Supervision/Assistance  Patient can return home with the following Assistance with cooking/housework;Assist for transportation;Help with stairs or ramp for entrance;A little help with walking and/or transfers;A little help with bathing/dressing/bathroom   Equipment Recommendations  None recommended by PT    Recommendations for Other Services       Precautions / Restrictions Precautions Precautions: Fall;Other (comment) Precaution Comments:  orthostasis Restrictions Weight Bearing Restrictions: No     Mobility  Bed Mobility               General bed mobility comments: Pt sitting up in recliner upon arrival.    Transfers Overall transfer level: Needs assistance Equipment used: Rolling walker (2 wheels) Transfers: Sit to/from Stand Sit to Stand: Min guard           General transfer comment: Min guard for safety, no LOB, cues for hand placement.    Ambulation/Gait Ambulation/Gait assistance: Min guard Gait Distance (Feet): 260 Feet Assistive device: Rolling walker (2 wheels) Gait Pattern/deviations: Step-through pattern, Decreased step length - right, Decreased stride length, Decreased weight shift to right, Trunk flexed Gait velocity: reduced Gait velocity interpretation: 1.31 - 2.62 ft/sec, indicative of limited community ambulator   General Gait Details: Pt with slightly slowed gait and flexed posture, increased effort advancing R leg with decreased weight shift to R. No LOB, min guard for safety. Cues to look superiorly and improve posture with good success.   Stairs             Wheelchair Mobility    Modified Rankin (Stroke Patients Only)       Balance Overall balance assessment: Needs assistance Sitting-balance support: No upper extremity supported, Feet supported Sitting balance-Leahy Scale: Good     Standing balance support: Bilateral upper extremity supported, Reliant on assistive device for balance, During functional activity Standing balance-Leahy Scale: Poor Standing balance comment: Reliant on RW                            Cognition Arousal/Alertness: Awake/alert Behavior During Therapy: WFL for tasks assessed/performed Overall Cognitive Status:  Within Functional Limits for tasks assessed                                          Exercises      General Comments General comments (skin integrity, edema, etc.): Educated pt to use RW at d/c and  to perform transitions slowly and pause before proceeding and perform exercises with limbs to maintain stable BP; no lightheadedness throughout, TED hose donned throughout; BP 182/97 sitting, 182/98 standing, 169/94 sitting after gait bout      Pertinent Vitals/Pain Pain Assessment Pain Assessment: Faces Faces Pain Scale: Hurts a little bit Pain Location: R leg, L thigh Pain Descriptors / Indicators: Discomfort, Guarding Pain Intervention(s): Limited activity within patient's tolerance, Monitored during session, Repositioned    Home Living                          Prior Function            PT Goals (current goals can now be found in the care plan section) Acute Rehab PT Goals Patient Stated Goal: get PT once he goes home PT Goal Formulation: With patient Time For Goal Achievement: 08/16/21 Potential to Achieve Goals: Good Progress towards PT goals: Progressing toward goals    Frequency    Min 3X/week      PT Plan Discharge plan needs to be updated;Equipment recommendations need to be updated    Co-evaluation              AM-PAC PT "6 Clicks" Mobility   Outcome Measure  Help needed turning from your back to your side while in a flat bed without using bedrails?: A Little Help needed moving from lying on your back to sitting on the side of a flat bed without using bedrails?: A Little Help needed moving to and from a bed to a chair (including a wheelchair)?: A Little Help needed standing up from a chair using your arms (e.g., wheelchair or bedside chair)?: A Little Help needed to walk in hospital room?: A Little Help needed climbing 3-5 steps with a railing? : A Little 6 Click Score: 18    End of Session   Activity Tolerance: Patient tolerated treatment well Patient left: with call bell/phone within reach;with family/visitor present;in chair   PT Visit Diagnosis: Difficulty in walking, not elsewhere classified (R26.2);Unsteadiness on feet  (R26.81);Other abnormalities of gait and mobility (R26.89);Muscle weakness (generalized) (M62.81)     Time: 8546-2703 PT Time Calculation (min) (ACUTE ONLY): 19 min  Charges:  $Gait Training: 8-22 mins                     Moishe Spice, PT, DPT Acute Rehabilitation Services  Pager: [phone] Office: [place] 08/03/2021, 5:02 PM

## 2021-08-03 NOTE — Progress Notes (Signed)
PROGRESS NOTE                                                                                                                                                                                                             Patient Demographics:    Andrew Arnold, is a 66 y.o. male, DOB - 06/06/1956, ZYS:[id number]  Outpatient Primary MD for the patient is [name], MD    LOS - 1  Admit date - 08/01/2021    Chief Complaint  Patient presents with   Emesis       Brief Narrative (HPI from H&P)   Andrew Arnold is a 66 y.o. male with medical history significant of DM2, HTN, HLD, non-occlusive CAD (LHC 05/23/21).  Presented to the hospital with chief complaints of nausea vomiting some cough shortness of breath and low-grade fevers.  In the ER he was diagnosed with pneumonia along with gastroenteritis and admitted to the hospital.   Subjective:   Patient in bed, appears comfortable, denies any headache, no fever, no chest pain or pressure, no shortness of breath , no abdominal pain. No new focal weakness.    Assessment  & Plan :    No notes have been filed under this hospital service. Service: Hospitalist    Nausea vomiting along with suspicion for aspiration pneumonia - no reason for his nausea vomiting, his last colonoscopy was 2015 he also has 15 to 20 pound unintentional weight loss.  Will check CT abdomen pelvis with oral contrast in the light of his renal failure, continue empiric antibiotics for possible pneumonia.  Outpatient GI and pulmonary follow-up most likely.  2.  Nodular lung densities.  Could be malignancy.  Outpatient pulmonary follow-up repeat CT and biopsy if needed.  3.  Severe dehydration, AKI, orthostatic hypotension.  Aggressively hydrate with IV fluids.  Hold diuretics and Entresto.  4.  Hypertension.  Was severely dehydrated and orthostatic upon admission after fluids blood pressure is  improved will place on low-dose Coreg and monitor.  5.  Deconditioning.  PT OT and monitor.  6.  Dyslipidemia.  On statin  7.  BPH.  On Flomax.  8.  Hypokalemia and hypomagnesemia.  Both replaced.  9.  Underlying left bundle branch block.  Supportive care.  On statin, starting him on low-dose beta-blocker and aspirin as well for secondary prevention.  Outpatient cardiology follow-up.  Recent echo noted with preserved EF of 65%.      Condition - Extremely Guarded  Family Communication  :  brother in law  Code Status :  Full  Consults  :    PUD Prophylaxis :    Procedures  :     CT Abd & Pelvis - 1. No acute noncontrast CT findings of the abdomen or pelvis to explain pain. 2. Descending and sigmoid diverticulosis without evidence of acute diverticulitis. 3. Prostatomegaly. 4. Multiple pulmonary nodules in the left lung base measuring up to 1.7 cm, as seen on prior dedicated examination of the chest. As previously reported, these are nonspecific, potentially infectious or inflammatory, however malignancy is not excluded. Aortic Atherosclerosis (ICD10-I70.0).  CTA - There is no evidence of pulmonary artery embolism. There are multiple new patchy nodular densities in the left lower lobe measuring up to 2.5 cm in size suggesting pneumonia. Possibility of 1 of these nodules being neoplastic process is not excluded. Short-term follow-up CT in 2-3 weeks after medical treatment for pneumonia should be considered. 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 There is 7 mm right renal stone.  Small hiatal hernia.   TTE Nov 2022 -    1. Left ventricular ejection fraction, by estimation, is 65 to 70%. The left ventricle has normal function. The left ventricle has no regional wall motion abnormalities. There is severe asymmetric left ventricular hypertrophy of the septal segment. Left  ventricular diastolic parameters are consistent with Grade II diastolic dysfunction  (pseudonormalization). Elevated left ventricular end-diastolic pressure.   2. Right ventricular systolic function is normal. The right ventricular size is normal. There is normal pulmonary artery systolic pressure. The estimated right ventricular systolic pressure is 47.4 mmHg.   3. There is a trivial pericardial effusion posterior to the left ventricle.   4. The mitral valve is grossly normal. Mild mitral valve regurgitation.   5. The aortic valve is tricuspid. Aortic valve regurgitation is not visualized. No aortic stenosis is present. Aortic valve mean gradient measures 6.0 mmHg.   6. The inferior vena cava is normal in size with greater than 50% respiratory variability, suggesting right atrial pressure of 3 mmHg.      Disposition Plan  :    Status is: Observation  DVT Prophylaxis  :    Place TED hose Start: 08/02/21 1008 enoxaparin (LOVENOX) injection 40 mg Start: 08/01/21 2200     Lab Results  Component Value Date   PLT 257 08/03/2021    Diet :  Diet Order             Diet clear liquid Room service appropriate? Yes; Fluid consistency: Thin  Diet effective now                    Inpatient Medications  Scheduled Meds:  atorvastatin  80 mg Oral QHS   enoxaparin (LOVENOX) injection  40 mg Subcutaneous Q24H   insulin aspart  0-9 Units Subcutaneous Q4H   pantoprazole  40 mg Oral Daily   potassium chloride  40 mEq Oral BID   tamsulosin  0.4 mg Oral QPM   Continuous Infusions:  azithromycin 500 mg (08/02/21 2301)   cefTRIAXone (ROCEPHIN)  IV 2 g (08/02/21 2137)   lactated ringers     PRN Meds:.  Antibiotics  :    Anti-infectives (From admission, onward)    Start     Dose/Rate Route Frequency Ordered Stop   08/02/21  2000  cefTRIAXone (ROCEPHIN) 2 g in sodium chloride 0.9 % 100 mL IVPB        2 g 200 mL/hr over 30 Minutes Intravenous Every 24 hours 08/01/21 2125     08/01/21 2045  azithromycin (ZITHROMAX) 500 mg in sodium chloride 0.9 % 250 mL IVPB         500 mg 250 mL/hr over 60 Minutes Intravenous Every 24 hours 08/01/21 2044     08/01/21 2045  cefTRIAXone (ROCEPHIN) 1 g in sodium chloride 0.9 % 100 mL IVPB        1 g 200 mL/hr over 30 Minutes Intravenous  Once 08/01/21 2044 08/02/21 0008        Time Spent in minutes  30   Lala Lund M.D on 08/03/2021 at 8:08 AM  To page go to [email or web address]   [facility] -  Office  [phone]  See all Orders from today for further details    Objective:   Vitals:   08/03/21 0400 08/03/21 0510 08/03/21 0600 08/03/21 0745  BP: (!) 119/54  (!) 169/88 (!) 165/83  Pulse: 90 96 88 88  Resp: (!) 21 20 14 18   Temp:    99 F (37.2 C)  TempSrc:    Axillary  SpO2: 96% 96% 90% 97%    Wt Readings from Last 3 Encounters:  07/31/21 103.7 kg  06/11/21 (P) 110 kg  06/03/21 113.4 kg     Intake/Output Summary (Last 24 hours) at 08/03/2021 0808 Last data filed at 08/03/2021 0998 Gross per 24 hour  Intake 1772.96 ml  Output 1300 ml  Net 472.96 ml     Physical Exam  Awake Alert, No new F.N deficits, Normal affect [place].AT,PERRAL Supple Neck, No JVD,   Symmetrical Chest wall movement, Good air movement bilaterally, CTAB RRR,No Gallops, Rubs or new Murmurs,  +ve B.Sounds, Abd Soft, No tenderness,   No Cyanosis, Clubbing or edema        Data Review:    CBC Recent Labs  Lab 07/30/21 1428 08/01/21 1434 08/02/21 0436 08/03/21 0132  WBC 5.5 6.0 5.8 4.4  HGB 11.8* 12.7* 11.0* 10.8*  HCT 36.6* 39.4 33.2* 32.1*  PLT 308 325 252 257  MCV 100.5* 100.5* 99.7 97.9  MCH 32.4 32.4 33.0 32.9  MCHC 32.2 32.2 33.1 33.6  RDW 13.2 13.4 13.4 13.1  LYMPHSABS  --  1.4  --  1.1  MONOABS  --  0.6  --  0.5  EOSABS  --  0.1  --  0.1  BASOSABS  --  0.0  --  0.0    Electrolytes Recent Labs  Lab 07/30/21 1428 07/30/21 1441 08/01/21 1434 08/01/21 2318 08/02/21 0436 08/02/21 0453 08/03/21 0132  NA 138  --  139  --  140  --  141  K 3.3*  --  3.2*  --  3.6  --  3.1*  CL 99  --  101  --   105  --  105  CO2 21*  --  22  --  21*  --  26  GLUCOSE 88  --  102*  --  119*  --  108*  BUN 8  --  9  --  8  --  5*  CREATININE 1.21  --  1.50*  --  1.51*  --  1.08  CALCIUM 9.6  --  9.6  --  8.4*  --  8.9  AST  --  14* 15  --   --   --  14*  ALT  --  12 13  --   --   --  11  ALKPHOS  --  86 88  --   --   --  85  BILITOT  --  1.5* 0.9  --   --   --  0.9  ALBUMIN  --  3.6 3.8  --   --   --  2.9*  MG  --   --   --   --   --   --  1.6*  CRP  --   --   --   --   --   --  0.8  PROCALCITON  --   --   --   --   --  <0.10 <0.10  LATICACIDVEN  --   --   --  1.0 0.8  --   --   BNP  --  46.1  --   --  74.3  --  66.0    ------------------------------------------------------------------------------------------------------------------ No results for input(s): CHOL, HDL, LDLCALC, TRIG, CHOLHDL, LDLDIRECT in the last 72 hours.  Lab Results  Component Value Date   HGBA1C 6.7 (H) 06/02/2021    No results for input(s): TSH, T4TOTAL, T3FREE, THYROIDAB in the last 72 hours.  Invalid input(s): FREET3 ------------------------------------------------------------------------------------------------------------------ ID Labs Recent Labs  Lab 07/30/21 1428 08/01/21 1434 08/01/21 2318 08/02/21 0436 08/02/21 0453 08/03/21 0132  WBC 5.5 6.0  --  5.8  --  4.4  PLT 308 325  --  252  --  257  CRP  --   --   --   --   --  0.8  PROCALCITON  --   --   --   --  <0.10 <0.10  LATICACIDVEN  --   --  1.0 0.8  --   --   CREATININE 1.21 1.50*  --  1.51*  --  1.08   Cardiac Enzymes No results for input(s): CKMB, TROPONINI, MYOGLOBIN in the last 168 hours.  Invalid input(s): CK   Radiology Reports CT ABDOMEN PELVIS WO CONTRAST  Result Date: 08/02/2021 CLINICAL DATA:  Abdominal pain and vomiting EXAM: CT ABDOMEN AND PELVIS WITHOUT CONTRAST TECHNIQUE: Multidetector CT imaging of the abdomen and pelvis was performed following the standard protocol without IV contrast. RADIATION DOSE REDUCTION: This exam  was performed according to the departmental dose-optimization program which includes automated exposure control, adjustment of the mA and/or kV according to patient size and/or use of iterative reconstruction technique. COMPARISON:  CT chest angiogram, 07/30/2021 FINDINGS: Lower chest: Trace left pleural effusion. Multiple pulmonary nodules in the left lung base measuring up to 1.7 cm, as seen on prior dedicated examination of the chest (series 5, image 12). Small hiatal hernia. Hepatobiliary: No solid liver abnormality is seen. No gallstones, gallbladder wall thickening, or biliary dilatation. Pancreas: Unremarkable. No pancreatic ductal dilatation or surrounding inflammatory changes. Spleen: Normal in size without significant abnormality. Adrenals/Urinary Tract: Adrenal glands are unremarkable. Kidneys are normal, without renal calculi, solid lesion, or hydronephrosis. Bladder is unremarkable. Stomach/Bowel: Stomach is within normal limits. Appendix appears normal. No evidence of bowel wall thickening, distention, or inflammatory changes. Descending and sigmoid diverticulosis. Vascular/Lymphatic: Aortic atherosclerosis. No enlarged abdominal or pelvic lymph nodes. Reproductive: Prostatomegaly and median lobe hypertrophy. Other: No abdominal wall hernia or abnormality. No ascites. Musculoskeletal: No acute or significant osseous findings. IMPRESSION: 1. No acute noncontrast CT findings of the abdomen or pelvis to explain pain. 2. Descending and sigmoid diverticulosis without evidence of acute diverticulitis. 3. Prostatomegaly. 4. Multiple  pulmonary nodules in the left lung base measuring up to 1.7 cm, as seen on prior dedicated examination of the chest. As previously reported, these are nonspecific, potentially infectious or inflammatory, however malignancy is not excluded. Aortic Atherosclerosis (ICD10-I70.0). Electronically Signed   By: Delanna Ahmadi M.D.   On: 08/02/2021 15:29   DG Chest 2 View  Result Date:  08/01/2021 CLINICAL DATA:  Shortness of breath. EXAM: CHEST - 2 VIEW COMPARISON:  CT a chest dated July 30, 2021. Chest x-ray dated August 30, 2017. FINDINGS: Unchanged mild cardiomegaly. Normal pulmonary vascularity. Subtle patchy opacities in the left lower lobe, better evaluated on recent chest CT. No pleural effusion or pneumothorax. No acute osseous abnormality. IMPRESSION: 1. Left lower lobe pneumonia, better evaluated on recent chest CT. Electronically Signed   By: Titus Dubin M.D.   On: 08/01/2021 14:50   CT Angio Chest PE W and/or Wo Contrast  Result Date: 07/30/2021 CLINICAL DATA:  Chest pain EXAM: CT ANGIOGRAPHY CHEST WITH CONTRAST TECHNIQUE: Multidetector CT imaging of the chest was performed using the standard protocol during bolus administration of intravenous contrast. Multiplanar CT image reconstructions and MIPs were obtained to evaluate the vascular anatomy. RADIATION DOSE REDUCTION: This exam was performed according to the departmental dose-optimization program which includes automated exposure control, adjustment of the mA and/or kV according to patient size and/or use of iterative reconstruction technique. CONTRAST:  23mL OMNIPAQUE IOHEXOL 350 MG/ML SOLN COMPARISON:  08/29/2017 FINDINGS: Cardiovascular: Heart is enlarged in size. Scattered coronary artery calcifications are seen. Contrast density in the thoracic aorta is less than optimal. As far as seen, there is no definite demonstrable intimal flap. 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 There are no intraluminal filling defects in the pulmonary artery branches. Mediastinum/Nodes: No new significant lymphadenopathy seen. Lungs/Pleura: There are multiple new nodular densities in the left lower lobe largest measuring 2.5 cm in size. Many of these nodular densities are in the periphery of left lower lobe. Rest of the lung fields are clear. There is no significant pleural effusion or pneumothorax. Upper  Abdomen: There is 7 mm calculus in the upper pole of right kidney. Small hiatal hernia is seen. Musculoskeletal: Degenerative changes are noted in the thoracic spine and visualized lower cervical spine. Review of the MIP images confirms the above findings. IMPRESSION: There is no evidence of pulmonary artery embolism. There are multiple new patchy nodular densities in the left lower lobe measuring up to 2.5 cm in size suggesting pneumonia. Possibility of 1 of these nodules being neoplastic process is not excluded. Short-term follow-up CT in 2-3 weeks after medical treatment for pneumonia should be considered. 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 There is 7 mm right renal stone.  Small hiatal hernia. Electronically Signed   By: Elmer Picker M.D.   On: 07/30/2021 16:39   DG Abd Portable 1V  Result Date: 08/02/2021 CLINICAL DATA:  66 year old male with nausea and vomiting. EXAM: PORTABLE ABDOMEN - 1 VIEW COMPARISON:  Chest radiographs yesterday. FINDINGS: Portable AP supine views at 0650 hours. Non obstructed bowel gas pattern. No pneumoperitoneum evident on these supine views. Streaky left lung base opacity persists. Abdominal and pelvic visceral contours appear normal. Chronic lower lumbar disc and endplate degeneration. No acute osseous abnormality identified. IMPRESSION: Normal bowel gas pattern. Electronically Signed   By: Genevie Ann M.D.   On: 08/02/2021 07:31

## 2021-08-04 LAB — CBC WITH DIFFERENTIAL/PLATELET
Abs Immature Granulocytes: 0.01 10*3/uL (ref 0.00–0.07)
Basophils Absolute: 0 10*3/uL (ref 0.0–0.1)
Basophils Relative: 1 %
Eosinophils Absolute: 0.1 10*3/uL (ref 0.0–0.5)
Eosinophils Relative: 2 %
HCT: 31.6 % — ABNORMAL LOW (ref 39.0–52.0)
Hemoglobin: 10.4 g/dL — ABNORMAL LOW (ref 13.0–17.0)
Immature Granulocytes: 0 %
Lymphocytes Relative: 20 %
Lymphs Abs: 0.9 10*3/uL (ref 0.7–4.0)
MCH: 31.9 pg (ref 26.0–34.0)
MCHC: 32.9 g/dL (ref 30.0–36.0)
MCV: 96.9 fL (ref 80.0–100.0)
Monocytes Absolute: 0.5 10*3/uL (ref 0.1–1.0)
Monocytes Relative: 12 %
Neutro Abs: 2.8 10*3/uL (ref 1.7–7.7)
Neutrophils Relative %: 65 %
Platelets: 239 10*3/uL (ref 150–400)
RBC: 3.26 MIL/uL — ABNORMAL LOW (ref 4.22–5.81)
RDW: 13.2 % (ref 11.5–15.5)
WBC: 4.3 10*3/uL (ref 4.0–10.5)
nRBC: 0 % (ref 0.0–0.2)

## 2021-08-04 LAB — GLUCOSE, CAPILLARY
Glucose-Capillary: 109 mg/dL — ABNORMAL HIGH (ref 70–99)
Glucose-Capillary: 131 mg/dL — ABNORMAL HIGH (ref 70–99)
Glucose-Capillary: 146 mg/dL — ABNORMAL HIGH (ref 70–99)
Glucose-Capillary: 146 mg/dL — ABNORMAL HIGH (ref 70–99)
Glucose-Capillary: 93 mg/dL (ref 70–99)
Glucose-Capillary: 98 mg/dL (ref 70–99)

## 2021-08-04 LAB — PROCALCITONIN: Procalcitonin: <0.1 ng/mL

## 2021-08-04 LAB — COMPREHENSIVE METABOLIC PANEL
ALT: 9 U/L (ref 0–44)
AST: 12 U/L — ABNORMAL LOW (ref 15–41)
Albumin: 2.9 g/dL — ABNORMAL LOW (ref 3.5–5.0)
Alkaline Phosphatase: 84 U/L (ref 38–126)
Anion gap: 10 (ref 5–15)
BUN: <5 mg/dL — ABNORMAL LOW (ref 8–23)
CO2: 25 mmol/L (ref 22–32)
Calcium: 8.9 mg/dL (ref 8.9–10.3)
Chloride: 107 mmol/L (ref 98–111)
Creatinine, Ser: 0.93 mg/dL (ref 0.61–1.24)
GFR, Estimated: >60 mL/min (ref >60)
Glucose, Bld: 105 mg/dL — ABNORMAL HIGH (ref 70–99)
Potassium: 3.4 mmol/L — ABNORMAL LOW (ref 3.5–5.1)
Sodium: 142 mmol/L (ref 135–145)
Total Bilirubin: 1 mg/dL (ref 0.3–1.2)
Total Protein: 5.9 g/dL — ABNORMAL LOW (ref 6.5–8.1)

## 2021-08-04 LAB — BRAIN NATRIURETIC PEPTIDE: B Natriuretic Peptide: 104.8 pg/mL — ABNORMAL HIGH (ref 0.0–100.0)

## 2021-08-04 LAB — C-REACTIVE PROTEIN: CRP: 0.8 mg/dL (ref <1.0)

## 2021-08-04 LAB — MAGNESIUM: Magnesium: 1.8 mg/dL (ref 1.7–2.4)

## 2021-08-04 MED ORDER — CARVEDILOL 6.25 MG PO TABS
6.2500 mg | ORAL_TABLET | Freq: Two times a day (BID) | ORAL | Status: DC
  Administered 2021-08-04 – 2021-08-05 (×3): 6.25 mg via ORAL
  Filled 2021-08-04 (×3): qty 1

## 2021-08-04 MED ORDER — POTASSIUM CHLORIDE CRYS ER 20 MEQ PO TBCR
40.0000 meq | EXTENDED_RELEASE_TABLET | Freq: Once | ORAL | Status: AC
  Administered 2021-08-04: 40 meq via ORAL
  Filled 2021-08-04: qty 2

## 2021-08-04 MED ORDER — MAGNESIUM SULFATE IN D5W 1-5 GM/100ML-% IV SOLN
1.0000 g | Freq: Once | INTRAVENOUS | Status: AC
  Administered 2021-08-04: 1 g via INTRAVENOUS
  Filled 2021-08-04: qty 100

## 2021-08-04 MED ORDER — AZITHROMYCIN 500 MG PO TABS
500.0000 mg | ORAL_TABLET | Freq: Every day | ORAL | Status: DC
  Administered 2021-08-04: 500 mg via ORAL
  Filled 2021-08-04: qty 1

## 2021-08-04 NOTE — TOC Initial Note (Signed)
Transition of Care [facility]) - Initial/Assessment Note    Patient Details  Name: Andrew Arnold MRN: [id number] Date of Birth: Jul 28, 1955  Transition [facility]) CM/SW Contact:    Cyndi Bender, RN Phone Number: 08/04/2021, 1:06 PM  Clinical Narrative:                 Spoke to patient regarding transition needs. Patient states his brother in law lives with him. Patient has a walker and cane. Patient accepting of outpatient PT referral. He is agreeable to use the [facility] rehab in [place]. Referral sent and information added to AVS.  Patient states his brother in law can transport him to theapy.  TOC will continue to follow for needs.  Expected Discharge Plan: OP Rehab Barriers to Discharge: Continued Medical Work up   Patient Goals and CMS Choice Patient states their goals for this hospitalization and ongoing recovery are:: return home CMS Medicare.gov Compare Post Acute Care list provided to:: Patient Choice offered to / list presented to : Patient  Expected Discharge Plan and Services Expected Discharge Plan: OP Rehab   Discharge Planning Services: CM Consult   Living arrangements for the past 2 months: Single Family Home                                      Prior Living Arrangements/Services Living arrangements for the past 2 months: Single Family Home Lives with:: Other (Comment) (brother in law) Patient language and need for interpreter reviewed:: Yes Do you feel safe going back to the place where you live?: Yes      Need for Family Participation in Patient Care: Yes (Comment) Care giver support system in place?: Yes (comment) Current home services: DME (walker and cane) Criminal Activity/Legal Involvement Pertinent to Current Situation/Hospitalization: No - Comment as needed  Activities of Daily Living      Permission Sought/Granted Permission sought to share information with : Facility Art therapist granted to share  information with : Yes, Verbal Permission Granted     Permission granted to share info w AGENCY: out patient PT        Emotional Assessment Appearance:: Appears stated age Attitude/Demeanor/Rapport: Engaged Affect (typically observed): Accepting Orientation: : Oriented to Self, Oriented to Place, Oriented to  Time, Oriented to Situation Alcohol / Substance Use: Not Applicable Psych Involvement: No (comment)  Admission diagnosis:  CAP (community acquired pneumonia) [J18.9] Nausea and vomiting in adult [R11.2] Patient Active Problem List   Diagnosis Date Noted   CAP (community acquired pneumonia) 08/01/2021   Nausea and vomiting 08/01/2021   DM2 (diabetes mellitus, type 2) ([place]) 08/01/2021   Coronary artery disease involving native coronary artery of native heart without angina pectoris 07/31/2021   Stenosis of cervical spine with myelopathy ([place]) 06/03/2021   Macrocytic anemia 05/15/2021   Bilateral lower extremity edema 05/15/2021   BPH (benign prostatic hyperplasia) 05/15/2021   Elevated PSA 05/15/2021   Cervical spinal stenosis 05/15/2021   DDD (degenerative disc disease), cervical 05/15/2021   Syncope 05/14/2021   Elevated hemoglobin A1c 04/10/2021   Chest pain 08/29/2017   Fever 08/29/2017   Acute URI 08/29/2017   Moderate dehydration 08/29/2017   Sepsis ([place]) 08/29/2017   Elevated lactic acid level 08/29/2017   Acute gastroenteritis 08/29/2017   Diarrhea 08/29/2017   GERD (gastroesophageal reflux disease) 08/29/2017   Positive D dimer 08/29/2017   Encounter for screening colonoscopy 02/01/2014  Orthostatic hypotension 01/15/2014   Dyslipidemia 08/14/2008   Essential hypertension 08/14/2008   Seasonal and perennial allergic rhinitis 04/23/2007   PCP:  [name], MD Pharmacy:   [facility] [phone] - [place], [place] AT [place] [address] [place] [address] Phone: [phone] Fax:  [phone]     Social Determinants of Health (SDOH) Interventions    Readmission Risk Interventions No flowsheet data found.

## 2021-08-04 NOTE — Progress Notes (Signed)
PHARMACIST - PHYSICIAN COMMUNICATION DR:   Candiss Norse CONCERNING: Antibiotic IV to Oral Route Change Policy  RECOMMENDATION: This patient is receiving azithromycin by the intravenous route.  Based on criteria approved by the Pharmacy and Therapeutics Committee, the antibiotic(s) is/are being converted to the equivalent oral dose form(s).   DESCRIPTION: These criteria include: Patient being treated for a respiratory tract infection, urinary tract infection, cellulitis or clostridium difficile associated diarrhea if on metronidazole The patient is not neutropenic and does not exhibit a GI malabsorption state The patient is eating (either orally or via tube) and/or has been taking other orally administered medications for a least 24 hours The patient is improving clinically and has a Tmax < 100.5  If you have questions about this conversion, please contact the Pharmacy Department  []   [phone] )  [facility] []   [phone] )  [facility] [x]   [phone] )  [facility] []   [phone] )  [facility] []   [phone] )  [facility]

## 2021-08-04 NOTE — Progress Notes (Addendum)
Occupational Therapy Treatment Patient Details Name: Andrew Arnold MRN: [id number] DOB: [birth year] Today's Date: 08/04/2021   History of present illness 66 y.o. male  in ED on 1/18 with pleuritic CP (L sided CP worse with inspiration, unaffected by movement or activity). Diagnosed pna and discharged. Return to ED 08/01/21 with incr generalized weakness, N/V. COVID and flu negative  PMH significant of DM2, HTN, HLD, non-occlusive CAD, ACDF (pt reports RUE and RLE weakness ever since surgery).   OT comments  Pt making good progress with functional goals. Session focused on ADL mobility safety. Pt educate don tub bench for home use, LH bath sponge and LB ADL safety. OT will continue to follow acutely to maximize level of function and safety Pt with BP 167/96, no c/o being light headed or dizzy   Recommendations for follow up therapy are one component of a multi-disciplinary discharge planning process, led by the attending physician.  Recommendations may be updated based on patient status, additional functional criteria and insurance authorization.    Follow Up Recommendations  Home health OT    Assistance Recommended at Discharge Set up Supervision/Assistance  Patient can return home with the following  A little help with walking and/or transfers;Assistance with cooking/housework   Equipment Recommendations  Tub/shower bench;Other (comment) (Long handle bath sponge)    Recommendations for Other Services      Precautions / Restrictions Precautions Precautions: Fall;Other (comment) Precaution Comments: watch BP,orthostasis previous sessions Restrictions Weight Bearing Restrictions: No       Mobility Bed Mobility               General bed mobility comments: Pt sitting up in recliner upon arrival.    Transfers Overall transfer level: Needs assistance Equipment used: Rolling walker (2 wheels) Transfers: Sit to/from Stand Sit to Stand: Min guard           General  transfer comment: Min guard for safety, no LOB, cues for hand placement.     Balance Overall balance assessment: Needs assistance Sitting-balance support: No upper extremity supported, Feet supported Sitting balance-Leahy Scale: Good     Standing balance support: Bilateral upper extremity supported, Reliant on assistive device for balance, During functional activity Standing balance-Leahy Scale: Poor                             ADL either performed or assessed with clinical judgement   ADL Overall ADL's : Needs assistance/impaired     Grooming: Wash/dry hands;Wash/dry face;Min guard;Standing       Lower Body Bathing: Minimal assistance;Sitting/lateral leans Lower Body Bathing Details (indicate cue type and reason): simulated seated in recliner     Lower Body Dressing: Minimal assistance Lower Body Dressing Details (indicate cue type and reason): donning socks seated in recliner Toilet Transfer: Min guard;Rolling walker (2 wheels) Toilet Transfer Details (indicate cue type and reason): simulated chair to bed to chair Toileting- Clothing Manipulation and Hygiene: Minimal assistance;Sit to/from stand Toileting - Clothing Manipulation Details (indicate cue type and reason): clothing mgt     Functional mobility during ADLs: Min guard;Rolling walker (2 wheels);Cueing for safety General ADL Comments: Educated pt on tub bench for home use and LH bath sponge for LB bathing    Extremity/Trunk Assessment Upper Extremity Assessment Upper Extremity Assessment: Generalized weakness   Lower Extremity Assessment Lower Extremity Assessment: Defer to PT evaluation        Vision Baseline Vision/History: 1 Wears glasses Ability to See in Adequate  Light: 0 Adequate Patient Visual Report: No change from baseline     Perception     Praxis      Cognition Arousal/Alertness: Awake/alert Behavior During Therapy: WFL for tasks assessed/performed Overall Cognitive Status:  Within Functional Limits for tasks assessed                                          Exercises      Shoulder Instructions       General Comments      Pertinent Vitals/ Pain       Pain Assessment Pain Assessment: No/denies pain Pain Intervention(s): Monitored during session, Repositioned  Home Living                                          Prior Functioning/Environment              Frequency  Min 2X/week        Progress Toward Goals  OT Goals(current goals can now be found in the care plan section)  Progress towards OT goals: Progressing toward goals     Plan Discharge plan remains appropriate;Frequency remains appropriate    Co-evaluation                 AM-PAC OT "6 Clicks" Daily Activity     Outcome Measure   Help from another person eating meals?: None Help from another person taking care of personal grooming?: A Little Help from another person toileting, which includes using toliet, bedpan, or urinal?: A Little Help from another person bathing (including washing, rinsing, drying)?: A Little Help from another person to put on and taking off regular upper body clothing?: A Little Help from another person to put on and taking off regular lower body clothing?: A Little 6 Click Score: 19    End of Session Equipment Utilized During Treatment: Gait belt;Rolling walker (2 wheels)  OT Visit Diagnosis: Muscle weakness (generalized) (M62.81);Unsteadiness on feet (R26.81)   Activity Tolerance Patient tolerated treatment well   Patient Left with call bell/phone within reach;with family/visitor present;in chair;with chair alarm set   Nurse Communication          Time: 1791-5056 OT Time Calculation (min): 21 min  Charges: OT General Charges $OT Visit: 1 Visit OT Treatments $Therapeutic Activity: 8-22 mins    Britt Bottom 08/04/2021, 2:58 PM

## 2021-08-04 NOTE — Progress Notes (Signed)
°  Transition of Care [facility]) Screening Note   Patient Details  Name: Andrew Arnold Date of Birth: 09/28/1955   Transition of Care [facility]) CM/SW Contact:    Cyndi Bender, RN Phone Number: 08/04/2021, 9:05 AM    Transition of Care Department [facility]) has reviewed patient and no TOC needs have been identified at this time. We will continue to monitor patient advancement through interdisciplinary progression rounds. If new patient transition needs arise, please place a TOC consult.

## 2021-08-04 NOTE — Progress Notes (Signed)
PROGRESS NOTE                                                                                                                                                                                                             Patient Demographics:    Andrew Arnold, is a 66 y.o. male, DOB - 1955-07-29, DZH:[id number]  Outpatient Primary MD for the patient is [name], MD    LOS - 2  Admit date - 08/01/2021    Chief Complaint  Patient presents with   Emesis       Brief Narrative (HPI from H&P)   Andrew Arnold is a 66 y.o. male with medical history significant of DM2, HTN, HLD, non-occlusive CAD (LHC 05/23/21).  Presented to the hospital with chief complaints of nausea vomiting some cough shortness of breath and low-grade fevers.  In the ER he was diagnosed with pneumonia along with gastroenteritis and admitted to the hospital.   Subjective:   Patient in bed, appears comfortable, denies any headache, no fever, no chest pain or pressure, no shortness of breath , no abdominal pain. No new focal weakness.   Assessment  & Plan :    No notes have been filed under this hospital service. Service: Hospitalist    Nausea vomiting along with suspicion for aspiration pneumonia - no reason for his nausea vomiting, his last colonoscopy was 2015 he also has 15 to 20 pound unintentional weight loss.  Will check CT abdomen pelvis with oral contrast in the light of his renal failure, continue empiric antibiotics for possible pneumonia.  Outpatient GI and pulmonary follow-up most likely.  2.  Nodular lung densities.  Could be malignancy.  Outpatient pulmonary follow-up repeat CT and biopsy if needed.  3.  Severe dehydration, AKI, orthostatic hypotension.  Aggressively hydrated with IV fluids.  Hold diuretics and Entresto.BP better, added Coreg.  4.  Hypertension.  Was severely dehydrated and orthostatic upon admission after fluids  blood pressure is improved will place on low-dose Coreg and monitor.  5.  Deconditioning.  PT OT and monitor.  6.  Dyslipidemia.  On statin  7.  BPH.  On Flomax.  8.  Hypokalemia and hypomagnesemia.  Both replaced.  9.  Underlying left bundle branch block.  Supportive care.  On statin, starting him on low-dose beta-blocker and aspirin as well for secondary  prevention.  Outpatient cardiology follow-up.  Recent echo noted with preserved EF of 65%.      Condition - Extremely Guarded  Family Communication  :  brother in law  Code Status :  Full  Consults  :    PUD Prophylaxis :    Procedures  :     CT Abd & Pelvis - 1. No acute noncontrast CT findings of the abdomen or pelvis to explain pain. 2. Descending and sigmoid diverticulosis without evidence of acute diverticulitis. 3. Prostatomegaly. 4. Multiple pulmonary nodules in the left lung base measuring up to 1.7 cm, as seen on prior dedicated examination of the chest. As previously reported, these are nonspecific, potentially infectious or inflammatory, however malignancy is not excluded. Aortic Atherosclerosis (ICD10-I70.0).  CTA - There is no evidence of pulmonary artery embolism. There are multiple new patchy nodular densities in the left lower lobe measuring up to 2.5 cm in size suggesting pneumonia. Possibility of 1 of these nodules being neoplastic process is not excluded. Short-term follow-up CT in 2-3 weeks after medical treatment for pneumonia should be considered. 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 There is 7 mm right renal stone.  Small hiatal hernia.   TTE Nov 2022 -    1. Left ventricular ejection fraction, by estimation, is 65 to 70%. The left ventricle has normal function. The left ventricle has no regional wall motion abnormalities. There is severe asymmetric left ventricular hypertrophy of the septal segment. Left  ventricular diastolic parameters are consistent with Grade II  diastolic dysfunction (pseudonormalization). Elevated left ventricular end-diastolic pressure.   2. Right ventricular systolic function is normal. The right ventricular size is normal. There is normal pulmonary artery systolic pressure. The estimated right ventricular systolic pressure is 31.5 mmHg.   3. There is a trivial pericardial effusion posterior to the left ventricle.   4. The mitral valve is grossly normal. Mild mitral valve regurgitation.   5. The aortic valve is tricuspid. Aortic valve regurgitation is not visualized. No aortic stenosis is present. Aortic valve mean gradient measures 6.0 mmHg.   6. The inferior vena cava is normal in size with greater than 50% respiratory variability, suggesting right atrial pressure of 3 mmHg.      Disposition Plan  :    Status is: Observation  DVT Prophylaxis  :    Place TED hose Start: 08/02/21 1008 enoxaparin (LOVENOX) injection 40 mg Start: 08/01/21 2200     Lab Results  Component Value Date   PLT 239 08/04/2021    Diet :  Diet Order             DIET SOFT Room service appropriate? Yes; Fluid consistency: Thin  Diet effective now                    Inpatient Medications  Scheduled Meds:  aspirin  81 mg Oral Daily   atorvastatin  80 mg Oral QHS   carvedilol  6.25 mg Oral BID WC   enoxaparin (LOVENOX) injection  40 mg Subcutaneous Q24H   insulin aspart  0-9 Units Subcutaneous Q4H   pantoprazole  40 mg Oral Daily   tamsulosin  0.4 mg Oral QPM   Continuous Infusions:  azithromycin Stopped (08/03/21 2320)   cefTRIAXone (ROCEPHIN)  IV 200 mL/hr at 08/04/21 0452   PRN Meds:.  Antibiotics  :    Anti-infectives (From admission, onward)    Start     Dose/Rate Route Frequency Ordered Stop  08/02/21 2000  cefTRIAXone (ROCEPHIN) 2 g in sodium chloride 0.9 % 100 mL IVPB        2 g 200 mL/hr over 30 Minutes Intravenous Every 24 hours 08/01/21 2125     08/01/21 2045  azithromycin (ZITHROMAX) 500 mg in sodium chloride 0.9  % 250 mL IVPB        500 mg 250 mL/hr over 60 Minutes Intravenous Every 24 hours 08/01/21 2044     08/01/21 2045  cefTRIAXone (ROCEPHIN) 1 g in sodium chloride 0.9 % 100 mL IVPB        1 g 200 mL/hr over 30 Minutes Intravenous  Once 08/01/21 2044 08/02/21 0008        Time Spent in minutes  30   Lala Lund M.D on 08/04/2021 at 10:31 AM  To page go to [email or web address]   [facility] -  Office  [phone]  See all Orders from today for further details    Objective:   Vitals:   08/03/21 0745 08/03/21 1100 08/03/21 1533 08/04/21 0749  BP: (!) 165/83 (!) 146/93 (!) 165/90 (!) 159/85  Pulse: 88 83 88 88  Resp: 18 15 17 17   Temp: 99 F (37.2 C) 98.8 F (37.1 C)  98.1 F (36.7 C)  TempSrc: Axillary Oral  Oral  SpO2: 97%   97%    Wt Readings from Last 3 Encounters:  07/31/21 103.7 kg  06/11/21 (P) 110 kg  06/03/21 113.4 kg     Intake/Output Summary (Last 24 hours) at 08/04/2021 1031 Last data filed at 08/04/2021 8144 Gross per 24 hour  Intake 1204.17 ml  Output 850 ml  Net 354.17 ml     Physical Exam  Awake Alert, No new F.N deficits, Normal affect [place].AT,PERRAL Supple Neck, No JVD,   Symmetrical Chest wall movement, Good air movement bilaterally, CTAB RRR,No Gallops, Rubs or new Murmurs,  +ve B.Sounds, Abd Soft, No tenderness,   No Cyanosis, Clubbing or edema     Data Review:    CBC Recent Labs  Lab 07/30/21 1428 08/01/21 1434 08/02/21 0436 08/03/21 0132 08/04/21 0155  WBC 5.5 6.0 5.8 4.4 4.3  HGB 11.8* 12.7* 11.0* 10.8* 10.4*  HCT 36.6* 39.4 33.2* 32.1* 31.6*  PLT 308 325 252 257 239  MCV 100.5* 100.5* 99.7 97.9 96.9  MCH 32.4 32.4 33.0 32.9 31.9  MCHC 32.2 32.2 33.1 33.6 32.9  RDW 13.2 13.4 13.4 13.1 13.2  LYMPHSABS  --  1.4  --  1.1 0.9  MONOABS  --  0.6  --  0.5 0.5  EOSABS  --  0.1  --  0.1 0.1  BASOSABS  --  0.0  --  0.0 0.0    Electrolytes Recent Labs  Lab 07/30/21 1428 07/30/21 1441 08/01/21 1434 08/01/21 2318  08/02/21 0436 08/02/21 0453 08/03/21 0132 08/04/21 0155  NA 138  --  139  --  140  --  141 142  K 3.3*  --  3.2*  --  3.6  --  3.1* 3.4*  CL 99  --  101  --  105  --  105 107  CO2 21*  --  22  --  21*  --  26 25  GLUCOSE 88  --  102*  --  119*  --  108* 105*  BUN 8  --  9  --  8  --  5* <5*  CREATININE 1.21  --  1.50*  --  1.51*  --  1.08 0.93  CALCIUM 9.6  --  9.6  --  8.4*  --  8.9 8.9  AST  --  14* 15  --   --   --  14* 12*  ALT  --  12 13  --   --   --  11 9  ALKPHOS  --  86 88  --   --   --  85 84  BILITOT  --  1.5* 0.9  --   --   --  0.9 1.0  ALBUMIN  --  3.6 3.8  --   --   --  2.9* 2.9*  MG  --   --   --   --   --   --  1.6* 1.8  CRP  --   --   --   --   --   --  0.8 0.8  PROCALCITON  --   --   --   --   --  <0.10 <0.10 <0.10  LATICACIDVEN  --   --   --  1.0 0.8  --   --   --   BNP  --  46.1  --   --  74.3  --  66.0 104.8*    ------------------------------------------------------------------------------------------------------------------ No results for input(s): CHOL, HDL, LDLCALC, TRIG, CHOLHDL, LDLDIRECT in the last 72 hours.  Lab Results  Component Value Date   HGBA1C 6.7 (H) 06/02/2021    No results for input(s): TSH, T4TOTAL, T3FREE, THYROIDAB in the last 72 hours.  Invalid input(s): FREET3 ------------------------------------------------------------------------------------------------------------------ ID Labs Recent Labs  Lab 07/30/21 1428 08/01/21 1434 08/01/21 2318 08/02/21 0436 08/02/21 0453 08/03/21 0132 08/04/21 0155  WBC 5.5 6.0  --  5.8  --  4.4 4.3  PLT 308 325  --  252  --  257 239  CRP  --   --   --   --   --  0.8 0.8  PROCALCITON  --   --   --   --  <0.10 <0.10 <0.10  LATICACIDVEN  --   --  1.0 0.8  --   --   --   CREATININE 1.21 1.50*  --  1.51*  --  1.08 0.93   Cardiac Enzymes No results for input(s): CKMB, TROPONINI, MYOGLOBIN in the last 168 hours.  Invalid input(s): CK   Radiology Reports CT ABDOMEN PELVIS WO  CONTRAST  Result Date: 08/02/2021 CLINICAL DATA:  Abdominal pain and vomiting EXAM: CT ABDOMEN AND PELVIS WITHOUT CONTRAST TECHNIQUE: Multidetector CT imaging of the abdomen and pelvis was performed following the standard protocol without IV contrast. RADIATION DOSE REDUCTION: This exam was performed according to the departmental dose-optimization program which includes automated exposure control, adjustment of the mA and/or kV according to patient size and/or use of iterative reconstruction technique. COMPARISON:  CT chest angiogram, 07/30/2021 FINDINGS: Lower chest: Trace left pleural effusion. Multiple pulmonary nodules in the left lung base measuring up to 1.7 cm, as seen on prior dedicated examination of the chest (series 5, image 12). Small hiatal hernia. Hepatobiliary: No solid liver abnormality is seen. No gallstones, gallbladder wall thickening, or biliary dilatation. Pancreas: Unremarkable. No pancreatic ductal dilatation or surrounding inflammatory changes. Spleen: Normal in size without significant abnormality. Adrenals/Urinary Tract: Adrenal glands are unremarkable. Kidneys are normal, without renal calculi, solid lesion, or hydronephrosis. Bladder is unremarkable. Stomach/Bowel: Stomach is within normal limits. Appendix appears normal. No evidence of bowel wall thickening, distention, or inflammatory changes. Descending and sigmoid diverticulosis. Vascular/Lymphatic: Aortic atherosclerosis. No enlarged abdominal or pelvic lymph nodes. Reproductive: Prostatomegaly and  median lobe hypertrophy. Other: No abdominal wall hernia or abnormality. No ascites. Musculoskeletal: No acute or significant osseous findings. IMPRESSION: 1. No acute noncontrast CT findings of the abdomen or pelvis to explain pain. 2. Descending and sigmoid diverticulosis without evidence of acute diverticulitis. 3. Prostatomegaly. 4. Multiple pulmonary nodules in the left lung base measuring up to 1.7 cm, as seen on prior dedicated  examination of the chest. As previously reported, these are nonspecific, potentially infectious or inflammatory, however malignancy is not excluded. Aortic Atherosclerosis (ICD10-I70.0). Electronically Signed   By: Delanna Ahmadi M.D.   On: 08/02/2021 15:29   DG Chest 2 View  Result Date: 08/01/2021 CLINICAL DATA:  Shortness of breath. EXAM: CHEST - 2 VIEW COMPARISON:  CT a chest dated July 30, 2021. Chest x-ray dated August 30, 2017. FINDINGS: Unchanged mild cardiomegaly. Normal pulmonary vascularity. Subtle patchy opacities in the left lower lobe, better evaluated on recent chest CT. No pleural effusion or pneumothorax. No acute osseous abnormality. IMPRESSION: 1. Left lower lobe pneumonia, better evaluated on recent chest CT. Electronically Signed   By: Titus Dubin M.D.   On: 08/01/2021 14:50   DG Abd Portable 1V  Result Date: 08/02/2021 CLINICAL DATA:  66 year old male with nausea and vomiting. EXAM: PORTABLE ABDOMEN - 1 VIEW COMPARISON:  Chest radiographs yesterday. FINDINGS: Portable AP supine views at 0650 hours. Non obstructed bowel gas pattern. No pneumoperitoneum evident on these supine views. Streaky left lung base opacity persists. Abdominal and pelvic visceral contours appear normal. Chronic lower lumbar disc and endplate degeneration. No acute osseous abnormality identified. IMPRESSION: Normal bowel gas pattern. Electronically Signed   By: Genevie Ann M.D.   On: 08/02/2021 07:31

## 2021-08-04 NOTE — Evaluation (Signed)
Clinical/Bedside Swallow Evaluation Patient Details  Name: Andrew Arnold MRN: [id number] Date of Birth: 04/29/1956  Today's Date: 08/04/2021 Time: SLP Start Time (ACUTE ONLY): 1100 SLP Stop Time (ACUTE ONLY): 1130 SLP Time Calculation (min) (ACUTE ONLY): 30 min  Past Medical History:  Past Medical History:  Diagnosis Date   Allergic rhinitis due to pollen    on allergy shots   Arrhythmia    1980s hospitalized for HTN and ?irregular rhythm but no specific dx but resolved during hospitalization   Diabetes mellitus without complication ([place])    HTN (hypertension)    Hyperlipidemia    Past Surgical History:  Past Surgical History:  Procedure Laterality Date   ANTERIOR CERVICAL DECOMP/DISCECTOMY FUSION N/A 06/03/2021   Procedure: Anterior Cervical Decompression Fusion  Cervical three-four;  Surgeon: Vallarie Mare, MD;  Location: [facility];  Service: Neurosurgery;  Laterality: N/A;   colonoscopy  2005   Dr. Gala Romney: normal rectum, sigmoid diverticula in colonic mucosa   COLONOSCOPY N/A 02/15/2014   Procedure: COLONOSCOPY;  Surgeon: Daneil Dolin, MD;  Location: [facility];  Service: Endoscopy;  Laterality: N/A;  2:00   RIGHT/LEFT HEART CATH AND CORONARY ANGIOGRAPHY N/A 05/23/2021   Procedure: RIGHT/LEFT HEART CATH AND CORONARY ANGIOGRAPHY;  Surgeon: Jolaine Artist, MD;  Location: [facility];  Service: Cardiovascular;  Laterality: N/A;   HPI:  66 y.o. male  in ED on 1/18 with pleuritic CP (L sided CP worse with inspiration, unaffected by movement or activity). Diagnosed pna and discharged. Return to ED 08/01/21 with incr generalized weakness, N/V. No reason yet found for nausea and vomiting. There are multiple new patchy nodular densities in the left lower lobe measuring up to 2.5 cm in size suggesting pneumonia.  COVID and flu negative. Pt underwent ACDF on 11/22 and discharged on 11/23. No SLP notes.  PMH significant of DM2, HTN, HLD, non-occlusive CAD, ACDF (pt reports  RUE and RLE weakness ever since surgery).    Assessment / Plan / Recommendation  Clinical Impression  Pt alert upright in chair with brother and sister presenet throughout the session. Oral mech exam revealed pt had adequate labial/lingual range of motion and strength.  Pt presented with thin liquids, puree applesauce, and solid graham crackers. Pt safely tolerated all textures with no overt s/sx of dysphagia. SLP observed pt using cup for spit-up. Pt c/o globus sensation and having to cough up phlegm across meals and throughout the day; noted increased feelings of nausea during mealtimes. Pt. presents with possible primary esophageal dysphagia possibly characterized by reflux or other GI-related underlying issues.  SLP recommends placing patient on regular diet with thin liquids but will defer diet reccomendations to MD as pt is currently on soft/bland diet to accommodate for mealtime-associated nausea. SLP Visit Diagnosis: Dysphagia, pharyngeal phase (R13.13);Dysphagia, unspecified (R13.10);Feeding difficulties (R63.3)    Aspiration Risk  No limitations    Diet Recommendation Regular;Thin liquid   Liquid Administration via: Cup;Straw Medication Administration: Whole meds with liquid Supervision: Patient able to self feed Compensations: Slow rate;Small sips/bites;Follow solids with liquid Postural Changes: Seated upright at 90 degrees;Remain upright for at least 30 minutes after po intake    Other  Recommendations Recommended Consults: Consider GI evaluation Oral Care Recommendations: Oral care BID    Recommendations for follow up therapy are one component of a multi-disciplinary discharge planning process, led by the attending physician.  Recommendations may be updated based on patient status, additional functional criteria and insurance authorization.  Follow up Recommendations No SLP  follow up      Assistance Recommended at Discharge PRN  Functional Status Assessment Patient has had a  recent decline in their functional status and demonstrates the ability to make significant improvements in function in a reasonable and predictable amount of time.  Frequency and Duration            Prognosis Prognosis for Safe Diet Advancement: Good      Swallow Study   General HPI: 66 y.o. male  in ED on 1/18 with pleuritic CP (L sided CP worse with inspiration, unaffected by movement or activity). Diagnosed pna and discharged. Return to ED 08/01/21 with incr generalized weakness, N/V. No reason yet found for nausea and vomiting. There are multiple new patchy nodular densities in the left lower lobe measuring up to 2.5 cm in size suggesting pneumonia.  COVID and flu negative. Pt underwent ACDF on 11/22 and discharged on 11/23. No SLP notes.  PMH significant of DM2, HTN, HLD, non-occlusive CAD, ACDF (pt reports RUE and RLE weakness ever since surgery). Type of Study: Bedside Swallow Evaluation Diet Prior to this Study: Regular;Thin liquids Temperature Spikes Noted: No Respiratory Status: Room air Behavior/Cognition: Alert;Cooperative;Pleasant mood Oral Cavity Assessment: Within Functional Limits Oral Care Completed by SLP: No Oral Cavity - Dentition: Adequate natural dentition Vision: Functional for self-feeding Self-Feeding Abilities: Able to feed self Patient Positioning: Upright in chair Baseline Vocal Quality: Normal Volitional Swallow: Able to elicit    Oral/Motor/Sensory Function Overall Oral Motor/Sensory Function: Within functional limits   Ice Chips Ice chips: Not tested   Thin Liquid Thin Liquid: Within functional limits Presentation: Straw    Nectar Thick Nectar Thick Liquid: Not tested   Honey Thick Honey Thick Liquid: Not tested   Puree Puree: Within functional limits Presentation: Self Fed   Solid     Solid: Within functional limits Presentation: Self Fed      [employer] 08/04/2021,1:34 PM

## 2021-08-05 ENCOUNTER — Encounter (HOSPITAL_COMMUNITY): Payer: Self-pay | Admitting: Internal Medicine

## 2021-08-05 LAB — COMPREHENSIVE METABOLIC PANEL
ALT: 10 U/L (ref 0–44)
AST: 14 U/L — ABNORMAL LOW (ref 15–41)
Albumin: 3 g/dL — ABNORMAL LOW (ref 3.5–5.0)
Alkaline Phosphatase: 92 U/L (ref 38–126)
Anion gap: 9 (ref 5–15)
BUN: 5 mg/dL — ABNORMAL LOW (ref 8–23)
CO2: 26 mmol/L (ref 22–32)
Calcium: 8.6 mg/dL — ABNORMAL LOW (ref 8.9–10.3)
Chloride: 105 mmol/L (ref 98–111)
Creatinine, Ser: 1.11 mg/dL (ref 0.61–1.24)
GFR, Estimated: >60 mL/min (ref >60)
Glucose, Bld: 118 mg/dL — ABNORMAL HIGH (ref 70–99)
Potassium: 3.3 mmol/L — ABNORMAL LOW (ref 3.5–5.1)
Sodium: 140 mmol/L (ref 135–145)
Total Bilirubin: 0.3 mg/dL (ref 0.3–1.2)
Total Protein: 6.2 g/dL — ABNORMAL LOW (ref 6.5–8.1)

## 2021-08-05 LAB — CBC WITH DIFFERENTIAL/PLATELET
Abs Immature Granulocytes: 0.01 10*3/uL (ref 0.00–0.07)
Basophils Absolute: 0 10*3/uL (ref 0.0–0.1)
Basophils Relative: 1 %
Eosinophils Absolute: 0.1 10*3/uL (ref 0.0–0.5)
Eosinophils Relative: 2 %
HCT: 32.5 % — ABNORMAL LOW (ref 39.0–52.0)
Hemoglobin: 10.6 g/dL — ABNORMAL LOW (ref 13.0–17.0)
Immature Granulocytes: 0 %
Lymphocytes Relative: 25 %
Lymphs Abs: 1.1 10*3/uL (ref 0.7–4.0)
MCH: 32.1 pg (ref 26.0–34.0)
MCHC: 32.6 g/dL (ref 30.0–36.0)
MCV: 98.5 fL (ref 80.0–100.0)
Monocytes Absolute: 0.5 10*3/uL (ref 0.1–1.0)
Monocytes Relative: 11 %
Neutro Abs: 2.8 10*3/uL (ref 1.7–7.7)
Neutrophils Relative %: 61 %
Platelets: 250 10*3/uL (ref 150–400)
RBC: 3.3 MIL/uL — ABNORMAL LOW (ref 4.22–5.81)
RDW: 13.2 % (ref 11.5–15.5)
WBC: 4.6 10*3/uL (ref 4.0–10.5)
nRBC: 0 % (ref 0.0–0.2)

## 2021-08-05 LAB — GLUCOSE, CAPILLARY
Glucose-Capillary: 117 mg/dL — ABNORMAL HIGH (ref 70–99)
Glucose-Capillary: 116 mg/dL — ABNORMAL HIGH (ref 70–99)
Glucose-Capillary: 110 mg/dL — ABNORMAL HIGH (ref 70–99)
Glucose-Capillary: 133 mg/dL — ABNORMAL HIGH (ref 70–99)

## 2021-08-05 LAB — BRAIN NATRIURETIC PEPTIDE: B Natriuretic Peptide: 133.4 pg/mL — ABNORMAL HIGH (ref 0.0–100.0)

## 2021-08-05 LAB — MAGNESIUM: Magnesium: 1.8 mg/dL (ref 1.7–2.4)

## 2021-08-05 LAB — C-REACTIVE PROTEIN: CRP: 0.9 mg/dL (ref <1.0)

## 2021-08-05 MED ORDER — ASPIRIN 81 MG PO CHEW: 81.0000 mg | CHEWABLE_TABLET | Freq: Every day | ORAL | 0 refills | Status: DC

## 2021-08-05 MED ORDER — AZITHROMYCIN 500 MG PO TABS: 500.0000 mg | ORAL_TABLET | Freq: Every day | ORAL | 0 refills | Status: DC

## 2021-08-05 MED ORDER — CARVEDILOL 6.25 MG PO TABS: 6.2500 mg | ORAL_TABLET | Freq: Two times a day (BID) | ORAL | 0 refills | Status: DC

## 2021-08-05 MED ORDER — POTASSIUM CHLORIDE CRYS ER 20 MEQ PO TBCR
40.0000 meq | EXTENDED_RELEASE_TABLET | Freq: Once | ORAL | Status: AC
  Administered 2021-08-05: 07:00:00 40 meq via ORAL
  Filled 2021-08-05: qty 2

## 2021-08-05 MED ORDER — PANTOPRAZOLE SODIUM 40 MG PO TBEC: 40.0000 mg | DELAYED_RELEASE_TABLET | Freq: Every day | ORAL | 0 refills | Status: DC

## 2021-08-05 MED ORDER — AMLODIPINE BESYLATE 10 MG PO TABS: 10.0000 mg | ORAL_TABLET | Freq: Every day | ORAL | 0 refills | Status: DC

## 2021-08-05 MED ORDER — CEPHALEXIN 500 MG PO CAPS: 500.0000 mg | ORAL_CAPSULE | Freq: Three times a day (TID) | ORAL | 0 refills | Status: AC

## 2021-08-05 MED ORDER — AMLODIPINE BESYLATE 10 MG PO TABS
10.0000 mg | ORAL_TABLET | Freq: Every day | ORAL | Status: DC
  Administered 2021-08-05: 07:00:00 10 mg via ORAL
  Filled 2021-08-05: qty 1

## 2021-08-05 NOTE — Progress Notes (Signed)
Physical Therapy Treatment Patient Details Name: Andrew Arnold MRN: [id number] DOB: 1955/11/06 Today's Date: 08/05/2021   History of Present Illness 66 y.o. male  in ED on 1/18 with pleuritic CP (L sided CP worse with inspiration, unaffected by movement or activity). Diagnosed pna and discharged. Return to ED 08/01/21 with incr generalized weakness, N/V. COVID and flu negative  PMH significant of DM2, HTN, HLD, non-occlusive CAD, ACDF (pt reports RUE and RLE weakness ever since surgery).    PT Comments    Patient preparing to discharge home today. Agreed he wanted to practice stair training and ambulation with RW (did not feel he was steady enough to try gait training without a device). No loss of balance or issues with stairs and is ready for discharge from PT perspective. Pt looking forward to continued PT at [facility] on dc.      Recommendations for follow up therapy are one component of a multi-disciplinary discharge planning process, led by the attending physician.  Recommendations may be updated based on patient status, additional functional criteria and insurance authorization.  Follow Up Recommendations  Outpatient PT     Assistance Recommended at Discharge Intermittent Supervision/Assistance  Patient can return home with the following Assistance with cooking/housework;Assist for transportation;Help with stairs or ramp for entrance;A little help with walking and/or transfers;A little help with bathing/dressing/bathroom   Equipment Recommendations  None recommended by PT    Recommendations for Other Services       Precautions / Restrictions Precautions Precautions: Fall;Other (comment) Precaution Comments: watch BP,orthostasis previous sessions Restrictions Weight Bearing Restrictions: No     Mobility  Bed Mobility               General bed mobility comments: Pt sitting up in recliner upon arrival.    Transfers Overall transfer level: Needs assistance Equipment  used: Rolling walker (2 wheels) Transfers: Sit to/from Stand Sit to Stand: Modified independent (Device/Increase time)                Ambulation/Gait Ambulation/Gait assistance: Supervision Gait Distance (Feet): 200 Feet Assistive device: Rolling walker (2 wheels) Gait Pattern/deviations: Step-through pattern, Decreased step length - right, Decreased stride length, Decreased weight shift to right, Trunk flexed Gait velocity: reduced     General Gait Details: Pt with slightly slowed gait and flexed posture. No LOB, Cues to look forward and improve posture with good success.   Stairs Stairs: Yes Stairs assistance: Min guard Stair Management: Two rails, Step to pattern, Forwards Number of Stairs: 10 General stair comments: slow and steady ascending and descending   Wheelchair Mobility    Modified Rankin (Stroke Patients Only)       Balance Overall balance assessment: Needs assistance Sitting-balance support: No upper extremity supported, Feet supported Sitting balance-Leahy Scale: Good     Standing balance support: Bilateral upper extremity supported, Reliant on assistive device for balance, During functional activity Standing balance-Leahy Scale: Fair Standing balance comment: can stand statically without RW; needs support for dynamic mobility                            Cognition Arousal/Alertness: Awake/alert Behavior During Therapy: WFL for tasks assessed/performed Overall Cognitive Status: Within Functional Limits for tasks assessed                                 General Comments: oriented x 4  Exercises      General Comments        Pertinent Vitals/Pain Pain Assessment Pain Assessment: No/denies pain    Home Living                          Prior Function            PT Goals (current goals can now be found in the care plan section) Acute Rehab PT Goals Patient Stated Goal: get PT once he goes  home PT Goal Formulation: With patient Time For Goal Achievement: 08/16/21 Potential to Achieve Goals: Good Progress towards PT goals: Progressing toward goals    Frequency    Min 3X/week      PT Plan Current plan remains appropriate    Co-evaluation              AM-PAC PT "6 Clicks" Mobility   Outcome Measure  Help needed turning from your back to your side while in a flat bed without using bedrails?: None Help needed moving from lying on your back to sitting on the side of a flat bed without using bedrails?: None Help needed moving to and from a bed to a chair (including a wheelchair)?: None Help needed standing up from a chair using your arms (e.g., wheelchair or bedside chair)?: None Help needed to walk in hospital room?: A Little Help needed climbing 3-5 steps with a railing? : A Little 6 Click Score: 22    End of Session Equipment Utilized During Treatment: Gait belt Activity Tolerance: Patient tolerated treatment well Patient left: with call bell/phone within reach;with family/visitor present;in chair   PT Visit Diagnosis: Difficulty in walking, not elsewhere classified (R26.2);Unsteadiness on feet (R26.81);Other abnormalities of gait and mobility (R26.89);Muscle weakness (generalized) (M62.81)     Time: 1638-4665 PT Time Calculation (min) (ACUTE ONLY): 11 min  Charges:  $Gait Training: 8-22 mins                      Arby Barrette, PT [place]  Pager [phone] Office [phone]    Rexanne Mano 08/05/2021, 11:56 AM

## 2021-08-05 NOTE — Discharge Summary (Signed)
Andrew Arnold LDJ:[id number] DOB: Feb 18, 1956 DOA: 08/01/2021  PCP: [name], MD  Admit date: 08/01/2021  Discharge date: 08/05/2021  Admitted From: Home   Disposition:  Home   Recommendations for Outpatient Follow-up:   Follow up with PCP in 1-2 weeks  PCP Please obtain BMP/CBC, 2 view CXR in 1week,  (see Discharge instructions)   PCP Please follow up on the following pending results: Please monitor blood pressure closely needs to see pulmonary within a week for nonspecific CT chest findings which could be a malignancy.  He needs to follow-up with pulmonary within a week and needs a repeat CT scan of the chest within 2 weeks.   Home Health: None  Equipment/Devices: None  Consultations: Pulm over the phone Discharge Condition: Stable    CODE STATUS: Full    Diet Recommendation: Heart Healthy strict 1.5 L fluid restriction per day  Chief Complaint  Patient presents with   Emesis     Brief history of present illness from the day of admission and additional interim summary    Andrew Arnold is a 66 y.o. male with medical history significant of DM2, HTN, HLD, non-occlusive CAD (LHC 05/23/21).  Presented to the hospital with chief complaints of nausea vomiting some cough shortness of breath and low-grade fevers.  In the ER he was diagnosed with pneumonia along with severe dehydration, hypotension and orthostasis.                                                                 Hospital Course     Generalized weakness, some cough, work-up suggestive of pneumonia versus possible gastroenteritis, on the floor he had no symptoms of gastroenteritis at all- no reason for his nausea vomiting, his last colonoscopy was 2015 he also has 15 to 20 pound unintentional weight loss.  ET scan chest abdomen pelvis done  kindly see report below nonspecific CT chest findings suspicious for pneumonia versus malignancy, case discussed with pulmonologist Dr. Jarvis Newcomer over the phone 10 days of empiric antibiotics with repeat CT scan in 2 weeks and outpatient pulmonary and PCP follow-up.   2.  Nodular lung densities.  Could be malignancy.  Outpatient pulmonary follow-up repeat CT and biopsy if needed.   3.  Severe dehydration, AKI, orthostatic hypotension.  Aggressively hydrated with IV fluids.  Hold diuretics and Entresto.follow with PCP and cardiologist outpatient within a week of discharge for further adjustment of blood pressure medications and diuretics if needed.   4.  Hypertension.  Was severely dehydrated and orthostatic upon admission after fluids blood pressure is improved will place on low-dose Coreg and Norvasc combination.  Currently stable PCP to monitor and adjust as needed   5.  Deconditioning.  PT OT and monitor.   6.  Dyslipidemia.  On statin   7.  BPH.  On Flomax.   8.  Hypokalemia and hypomagnesemia.  Both replaced.  Holding diuretics upon discharge he was extremely dehydrated and hypotensive I am of admission.   9.  Underlying left bundle branch block.  Supportive care.  On statin, starting him on low-dose beta-blocker and aspirin as well for secondary prevention.  Outpatient cardiology follow-up.  Recent echo noted with preserved EF of 65%.  He is symptom-free.   Discharge diagnosis     Principal Problem:   CAP (community acquired pneumonia) Active Problems:   Essential hypertension   Nausea and vomiting   DM2 (diabetes mellitus, type 2) [facility])    Discharge instructions    Discharge Instructions     Ambulatory referral to Physical Therapy   Complete by: As directed    Discharge instructions   Complete by: As directed    Follow with Primary MD [name], MD and the recommended pulmonary physician in 7 days   Get CBC, CMP, 2 view Chest X ray -  checked next visit within 1  week by Primary MD    Activity: As tolerated with Full fall precautions use walker/cane & assistance as needed  Disposition Home   Diet: Heart Healthy strict 1.5 L fluid restriction per day  Special Instructions: If you have smoked or chewed Tobacco  in the last 2 yrs please stop smoking, stop any regular Alcohol  and or any Recreational drug use.  On your next visit with your primary care physician please Get Medicines reviewed and adjusted.  Please request your Prim.MD to go over all Hospital Tests and Procedure/Radiological results at the follow up, please get all Hospital records sent to your Prim MD by signing hospital release before you go home.  If you experience worsening of your admission symptoms, develop shortness of breath, life threatening emergency, suicidal or homicidal thoughts you must seek medical attention immediately by calling 911 or calling your MD immediately  if symptoms less severe.  You Must read complete instructions/literature along with all the possible adverse reactions/side effects for all the Medicines you take and that have been prescribed to you. Take any new Medicines after you have completely understood and accpet all the possible adverse reactions/side effects.   Increase activity slowly   Complete by: As directed        Discharge Medications   Allergies as of 08/05/2021   No Known Allergies      Medication List     STOP taking these medications    cloNIDine 0.2 MG tablet Commonly known as: CATAPRES   Entresto 49-51 MG Generic drug: sacubitril-valsartan   hydrochlorothiazide 25 MG tablet Commonly known as: HYDRODIURIL   potassium chloride 10 MEQ tablet Commonly known as: KLOR-CON       TAKE these medications    amLODipine 10 MG tablet Commonly known as: NORVASC Take 1 tablet (10 mg total) by mouth daily. Start taking on: August 06, 2021   ascorbic acid 500 MG tablet Commonly known as: VITAMIN C Take 500 mg by mouth every  morning.   aspirin 81 MG chewable tablet Chew 1 tablet (81 mg total) by mouth daily. Start taking on: August 06, 2021   atorvastatin 80 MG tablet Commonly known as: LIPITOR Take 80 mg by mouth at bedtime.   azithromycin 500 MG tablet Commonly known as: ZITHROMAX Take 1 tablet (500 mg total) by mouth daily at 6 PM. What changed:  medication strength how much to take when to take this additional instructions   B  COMPLEX 100 PO Take 1 tablet by mouth every morning.   carvedilol 6.25 MG tablet Commonly known as: COREG Take 1 tablet (6.25 mg total) by mouth 2 (two) times daily with a meal.   cephALEXin 500 MG capsule Commonly known as: KEFLEX Take 1 capsule (500 mg total) by mouth 3 (three) times daily for 5 days.   cetirizine 10 MG tablet Commonly known as: ZYRTEC Take 10 mg by mouth in the morning.   metFORMIN 1000 MG tablet Commonly known as: GLUCOPHAGE Take 1,000 mg by mouth 2 (two) times daily.   pantoprazole 40 MG tablet Commonly known as: PROTONIX Take 1 tablet (40 mg total) by mouth daily. Start taking on: August 06, 2021   tamsulosin 0.4 MG Caps capsule Commonly known as: FLOMAX Take 1 capsule (0.4 mg total) by mouth daily. What changed: when to take this         [place]. Schedule an appointment as soon as possible for a visit.   Specialty: Rehabilitation Why: Call to schedule an appointment Contact information: [place] [phone] [place]        [name], MD. Schedule an appointment as soon as possible for a visit in 1 week(s).   Specialty: Family Medicine Contact information: [place] [address] [phone]         Garner Nash, DO. Schedule an appointment as soon as possible for a visit in 1 week(s).   Specialty: Pulmonary Disease Why: Pneumonia versus lung malignancy Contact  information: [place] [address] [phone]                 Major procedures and Radiology Reports - PLEASE review detailed and final reports thoroughly  -       CT ABDOMEN PELVIS WO CONTRAST  Result Date: 08/02/2021 CLINICAL DATA:  Abdominal pain and vomiting EXAM: CT ABDOMEN AND PELVIS WITHOUT CONTRAST TECHNIQUE: Multidetector CT imaging of the abdomen and pelvis was performed following the standard protocol without IV contrast. RADIATION DOSE REDUCTION: This exam was performed according to the departmental dose-optimization program which includes automated exposure control, adjustment of the mA and/or kV according to patient size and/or use of iterative reconstruction technique. COMPARISON:  CT chest angiogram, 07/30/2021 FINDINGS: Lower chest: Trace left pleural effusion. Multiple pulmonary nodules in the left lung base measuring up to 1.7 cm, as seen on prior dedicated examination of the chest (series 5, image 12). Small hiatal hernia. Hepatobiliary: No solid liver abnormality is seen. No gallstones, gallbladder wall thickening, or biliary dilatation. Pancreas: Unremarkable. No pancreatic ductal dilatation or surrounding inflammatory changes. Spleen: Normal in size without significant abnormality. Adrenals/Urinary Tract: Adrenal glands are unremarkable. Kidneys are normal, without renal calculi, solid lesion, or hydronephrosis. Bladder is unremarkable. Stomach/Bowel: Stomach is within normal limits. Appendix appears normal. No evidence of bowel wall thickening, distention, or inflammatory changes. Descending and sigmoid diverticulosis. Vascular/Lymphatic: Aortic atherosclerosis. No enlarged abdominal or pelvic lymph nodes. Reproductive: Prostatomegaly and median lobe hypertrophy. Other: No abdominal wall hernia or abnormality. No ascites. Musculoskeletal: No acute or significant osseous findings. IMPRESSION: 1. No acute noncontrast CT findings of the abdomen or  pelvis to explain pain. 2. Descending and sigmoid diverticulosis without evidence of acute diverticulitis. 3. Prostatomegaly. 4. Multiple pulmonary nodules in the left lung base measuring up to 1.7 cm, as seen on prior dedicated examination of the chest. As previously  reported, these are nonspecific, potentially infectious or inflammatory, however malignancy is not excluded. Aortic Atherosclerosis (ICD10-I70.0). Electronically Signed   By: Delanna Ahmadi M.D.   On: 08/02/2021 15:29   DG Chest 2 View  Result Date: 08/01/2021 CLINICAL DATA:  Shortness of breath. EXAM: CHEST - 2 VIEW COMPARISON:  CT a chest dated July 30, 2021. Chest x-ray dated August 30, 2017. FINDINGS: Unchanged mild cardiomegaly. Normal pulmonary vascularity. Subtle patchy opacities in the left lower lobe, better evaluated on recent chest CT. No pleural effusion or pneumothorax. No acute osseous abnormality. IMPRESSION: 1. Left lower lobe pneumonia, better evaluated on recent chest CT. Electronically Signed   By: Titus Dubin M.D.   On: 08/01/2021 14:50   CT Angio Chest PE W and/or Wo Contrast  Result Date: 07/30/2021 CLINICAL DATA:  Chest pain EXAM: CT ANGIOGRAPHY CHEST WITH CONTRAST TECHNIQUE: Multidetector CT imaging of the chest was performed using the standard protocol during bolus administration of intravenous contrast. Multiplanar CT image reconstructions and MIPs were obtained to evaluate the vascular anatomy. RADIATION DOSE REDUCTION: This exam was performed according to the departmental dose-optimization program which includes automated exposure control, adjustment of the mA and/or kV according to patient size and/or use of iterative reconstruction technique. CONTRAST:  31mL OMNIPAQUE IOHEXOL 350 MG/ML SOLN COMPARISON:  08/29/2017 FINDINGS: Cardiovascular: Heart is enlarged in size. Scattered coronary artery calcifications are seen. Contrast density in the thoracic aorta is less than optimal. As far as seen, there is no  definite demonstrable intimal flap. 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 There are no intraluminal filling defects in the pulmonary artery branches. Mediastinum/Nodes: No new significant lymphadenopathy seen. Lungs/Pleura: There are multiple new nodular densities in the left lower lobe largest measuring 2.5 cm in size. Many of these nodular densities are in the periphery of left lower lobe. Rest of the lung fields are clear. There is no significant pleural effusion or pneumothorax. Upper Abdomen: There is 7 mm calculus in the upper pole of right kidney. Small hiatal hernia is seen. Musculoskeletal: Degenerative changes are noted in the thoracic spine and visualized lower cervical spine. Review of the MIP images confirms the above findings. IMPRESSION: There is no evidence of pulmonary artery embolism. There are multiple new patchy nodular densities in the left lower lobe measuring up to 2.5 cm in size suggesting pneumonia. Possibility of 1 of these nodules being neoplastic process is not excluded. Short-term follow-up CT in 2-3 weeks after medical treatment for pneumonia should be considered. 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 There is 7 mm right renal stone.  Small hiatal hernia. Electronically Signed   By: Elmer Picker M.D.   On: 07/30/2021 16:39   DG Abd Portable 1V  Result Date: 08/02/2021 CLINICAL DATA:  66 year old male with nausea and vomiting. EXAM: PORTABLE ABDOMEN - 1 VIEW COMPARISON:  Chest radiographs yesterday. FINDINGS: Portable AP supine views at 0650 hours. Non obstructed bowel gas pattern. No pneumoperitoneum evident on these supine views. Streaky left lung base opacity persists. Abdominal and pelvic visceral contours appear normal. Chronic lower lumbar disc and endplate degeneration. No acute osseous abnormality identified. IMPRESSION: Normal bowel gas pattern. Electronically Signed   By: Genevie Ann M.D.   On: 08/02/2021  07:31     Today   Subjective    Andrew Arnold today has no headache,no chest abdominal pain,no new weakness tingling or numbness, feels much better wants to go home today.   Objective  Blood pressure 160/85, pulse 85, temperature 98.4 F (36.9 C), temperature source Oral, resp. rate 16, SpO2 95 %.   Intake/Output Summary (Last 24 hours) at 08/05/2021 1014 Last data filed at 08/05/2021 0352 Gross per 24 hour  Intake --  Output 650 ml  Net -650 ml    Exam  Awake Alert, No new F.N deficits,    [email or web address],PERRAL Supple Neck,   Symmetrical Chest wall movement, Good air movement bilaterally, CTAB RRR,No Gallops,   +ve B.Sounds, Abd Soft, Non tender,  No Cyanosis, Clubbing or edema    Data Review   CBC w Diff:  Lab Results  Component Value Date   WBC 4.6 08/05/2021   HGB 10.6 (L) 08/05/2021   HCT 32.5 (L) 08/05/2021   PLT 250 08/05/2021   LYMPHOPCT 25 08/05/2021   MONOPCT 11 08/05/2021   EOSPCT 2 08/05/2021   BASOPCT 1 08/05/2021    CMP:  Lab Results  Component Value Date   NA 140 08/05/2021   K 3.3 (L) 08/05/2021   CL 105 08/05/2021   CO2 26 08/05/2021   BUN 5 (L) 08/05/2021   CREATININE 1.11 08/05/2021   PROT 6.2 (L) 08/05/2021   ALBUMIN 3.0 (L) 08/05/2021   BILITOT 0.3 08/05/2021   ALKPHOS 92 08/05/2021   AST 14 (L) 08/05/2021   ALT 10 08/05/2021  .   Total Time in preparing paper work, data evaluation and todays exam - 57 minutes  Lala Lund M.D on 08/05/2021 at 10:14 AM  [facility]

## 2021-08-05 NOTE — TOC Transition Note (Signed)
Transition of Care [facility]) - CM/SW Discharge Note   Patient Details  Name: MAKI SWEETSER MRN: [id number] Date of Birth: 09/26/1955  [facility]) CM/SW Contact:  Cyndi Bender, RN Phone Number: 08/05/2021, 12:09 PM   Clinical Narrative:    Patient stable for discharge. DME ordered. Patient agreeable to use in house provider [employer]. Spoke to [place] with [employer] and transfer bench will be delivered to the room prior to discharge. Referral sent for outpt rehab sent yesterday.Patient has transportation home No other TOC needed    Final next level of care: OP Rehab Barriers to Discharge: Barriers Resolved   Patient Goals and CMS Choice Patient states their goals for this hospitalization and ongoing recovery are:: return home CMS Medicare.gov Compare Post Acute Care list provided to:: Patient Choice offered to / list presented to : Patient  Discharge Placement                       Discharge Plan and Services   Discharge Planning Services: CM Consult            DME Arranged: Other see comment (transfer bench) DME Agency: [employer] Date DME Agency Contacted: 08/05/21 Time DME Agency Contacted: 1208 Representative spoke with at DME Agency: [place] ([place]) Interventions     Readmission Risk Interventions No flowsheet data found.

## 2021-08-05 NOTE — Discharge Instructions (Addendum)
Follow with Primary MD [name], MD and the recommended pulmonary physician in 7 days   Get CBC, CMP, 2 view Chest X ray -  checked next visit within 1 week by Primary MD    Activity: As tolerated with Full fall precautions use walker/cane & assistance as needed  Disposition Home   Diet: Heart Healthy strict 1.5 L fluid restriction per day  Special Instructions: If you have smoked or chewed Tobacco  in the last 2 yrs please stop smoking, stop any regular Alcohol  and or any Recreational drug use.  On your next visit with your primary care physician please Get Medicines reviewed and adjusted.  Please request your Prim.MD to go over all Hospital Tests and Procedure/Radiological results at the follow up, please get all Hospital records sent to your Prim MD by signing hospital release before you go home.  If you experience worsening of your admission symptoms, develop shortness of breath, life threatening emergency, suicidal or homicidal thoughts you must seek medical attention immediately by calling 911 or calling your MD immediately  if symptoms less severe.  You Must read complete instructions/literature along with all the possible adverse reactions/side effects for all the Medicines you take and that have been prescribed to you. Take any new Medicines after you have completely understood and accpet all the possible adverse reactions/side effects.

## 2021-08-07 ENCOUNTER — Encounter: Payer: Self-pay | Admitting: Urology

## 2021-08-07 ENCOUNTER — Other Ambulatory Visit: Payer: Self-pay

## 2021-08-07 ENCOUNTER — Ambulatory Visit (INDEPENDENT_AMBULATORY_CARE_PROVIDER_SITE_OTHER): Payer: BC Managed Care – PPO | Admitting: Urology

## 2021-08-07 VITALS — BP 158/84 | HR 89 | Wt 225.0 lb

## 2021-08-07 DIAGNOSIS — R350 Frequency of micturition: Secondary | ICD-10-CM

## 2021-08-07 DIAGNOSIS — R972 Elevated prostate specific antigen [PSA]: Secondary | ICD-10-CM

## 2021-08-07 DIAGNOSIS — R351 Nocturia: Secondary | ICD-10-CM | POA: Diagnosis not present

## 2021-08-07 DIAGNOSIS — N401 Enlarged prostate with lower urinary tract symptoms: Secondary | ICD-10-CM

## 2021-08-07 LAB — URINALYSIS, ROUTINE W REFLEX MICROSCOPIC
Bilirubin, UA: NEGATIVE
Glucose, UA: NEGATIVE
Ketones, UA: NEGATIVE
Leukocytes,UA: NEGATIVE
Nitrite, UA: NEGATIVE
RBC, UA: NEGATIVE
Specific Gravity, UA: 1.015 (ref 1.005–1.030)
Urobilinogen, Ur: 0.2 mg/dL (ref 0.2–1.0)
pH, UA: 6.5 (ref 5.0–7.5)

## 2021-08-07 LAB — CULTURE, BLOOD (ROUTINE X 2)
Culture: NO GROWTH
Culture: NO GROWTH
Special Requests: ADEQUATE
Special Requests: ADEQUATE

## 2021-08-07 LAB — MICROSCOPIC EXAMINATION
Bacteria, UA: NONE SEEN
Epithelial Cells (non renal): NONE SEEN /hpf (ref 0–10)
RBC, Urine: NONE SEEN /hpf (ref 0–2)
Renal Epithel, UA: NONE SEEN /hpf
WBC, UA: NONE SEEN /hpf (ref 0–5)

## 2021-08-07 NOTE — Progress Notes (Signed)
Urological Symptom Review  Patient is experiencing the following symptoms: none   Review of Systems  Gastrointestinal (upper)  : Negative for upper GI symptoms  Gastrointestinal (lower) : Negative for lower GI symptoms  Constitutional : Weight loss  Skin: Negative for skin symptoms  Eyes: Negative for eye symptoms  Ear/Nose/Throat : Negative for Ear/Nose/Throat symptoms  Hematologic/Lymphatic: Negative for Hematologic/Lymphatic symptoms  Cardiovascular : Negative for cardiovascular symptoms  Respiratory : Negative for respiratory symptoms  Endocrine: Negative for endocrine symptoms  Musculoskeletal: Negative for musculoskeletal symptoms  Neurological: Negative for neurological symptoms  Psychologic: Negative for psychiatric symptoms

## 2021-08-07 NOTE — Progress Notes (Signed)
Subjective: 1. Benign prostatic hyperplasia with urinary frequency   2. Nocturia   3. Urinary frequency   4. Elevated PSA      Consult requested by Dr. [name].   04/10/21: Andrew Arnold is a 66 yo male who is sent for evaluation of voiding symptoms.   He has about a 3 month history  frequency q1-2hrs but his main compaint is nocturia 3+ x nightly.  He has some urgency and with incontinence and is wearing depends.  He has a good stream and feels he empties.   He has no dysuria and no hematuria.  He has no other GU surgery.  His symptoms are not impacted by the HCTZ.  He has not been treated for his symptoms.  He had a PSA that was 3 in 6/20.   He has some RUE numbness and RLE weakness for 3 months and is seeing neurology for that.  He has some dizziness in the AM.  He had a normal MRI of the head on 04/01/21.   He had some lumbar DDD on a recent spine series.  His UA has 0-2 RBC's and his PVR is 109ml.    05/01/21: Andrew Arnold returns today in f/u.   He was given tamsulosin for his LUTS.  He still has nocturia x 3 and reports frequency every 2 hours and he has urgency.  He wears depends but is generally able to get to the bathroom before leaking.   His IPSS today is only 3.   His PVR was 9ml.   He had  Cervical MRI on 10/17 that shows cervical spinal stenosis and DDD.   He had a repeat PSA on 04/10/21 and it was elevated at 5.5 with a 15.5 % f/t ratio.    08/07/21: Andrew Arnold returns today in f/u.   He was in the ER on 07/30/21 and treated for pneumonia but was then hospitalized with CP on 08/01/21.  He was discharge with with keflex on 08/05/21.  He remains on tamsulosin.  He is voiding better with an IPSS of 2 from nocturia.  He was supposed to have a prostate biopsy but has had some surgery for a cervical disc on 06/03/21.  He was admitted for CHF prior to that in early November after a syncopal episode.  He had a CT [facility] on 08/02/21 and his prostate is about 185ml with a middle lobe.    IPSS     Row Name  08/07/21 1400         International Prostate Symptom Score   How often have you had the sensation of not emptying your bladder? Not at All     How often have you had to urinate less than every two hours? Not at All     How often have you found you stopped and started again several times when you urinated? Not at All     How often have you found it difficult to postpone urination? Not at All     How often have you had a weak urinary stream? Not at All     How often have you had to strain to start urination? Not at All     How many times did you typically get up at night to urinate? 2 Times     Total IPSS Score 2       Quality of Life due to urinary symptoms   If you were to spend the rest of your life with your urinary condition just the way  it is now how would you feel about that? Delighted              ROS:  ROS  No Known Allergies  Past Medical History:  Diagnosis Date   Allergic rhinitis due to pollen    on allergy shots   Arrhythmia    1980s hospitalized for HTN and ?irregular rhythm but no specific dx but resolved during hospitalization   Diabetes mellitus without complication ([place])    HTN (hypertension)    Hyperlipidemia     Past Surgical History:  Procedure Laterality Date   ANTERIOR CERVICAL DECOMP/DISCECTOMY FUSION N/A 06/03/2021   Procedure: Anterior Cervical Decompression Fusion  Cervical three-four;  Surgeon: Vallarie Mare, MD;  Location: [facility];  Service: Neurosurgery;  Laterality: N/A;   colonoscopy  2005   Dr. Gala Romney: normal rectum, sigmoid diverticula in colonic mucosa   COLONOSCOPY N/A 02/15/2014   Procedure: COLONOSCOPY;  Surgeon: Daneil Dolin, MD;  [facility];  Service: Endoscopy;  Laterality: N/A;  2:00   RIGHT/LEFT HEART CATH AND CORONARY ANGIOGRAPHY N/A 05/23/2021   Procedure: RIGHT/LEFT HEART CATH AND CORONARY ANGIOGRAPHY;  Surgeon: Jolaine Artist, MD;  Location: [facility];  Service: Cardiovascular;  Laterality:  N/A;    Social History   Socioeconomic History   Marital status: Single    Spouse name: Not on file   Number of children: Not on file   Years of education: Not on file   Highest education level: Not on file  Occupational History   Occupation: school counselor  Tobacco Use   Smoking status: Never   Smokeless tobacco: Never  Vaping Use   Vaping Use: Never used  Substance and Sexual Activity   Alcohol use: No   Drug use: No   Sexual activity: Yes  Other Topics Concern   Not on file  Social History Narrative   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[employer], [place]   Lives in [place] Determinants of Health   Financial Resource Strain: Not on file  Food Insecurity: Not on file  Transportation Needs: Not on file  Physical Activity: Not on file  Stress: Not on file  Social Connections: Not on file  Intimate Partner Violence: Not on file    Family History  Problem Relation Age of Onset   Cancer Mother    Cirrhosis Father        cirrhosis of the liver; alcohol related   Diabetes Sister    Other Other        neice was on allergy vaccine when young   Colon cancer Neg Hx     Anti-infectives: Anti-infectives (From admission, onward)    None       Current Outpatient Medications  Medication Sig Dispense Refill   amLODipine (NORVASC) 10 MG tablet Take 1 tablet (10 mg total) by mouth daily. 30 tablet 0   ascorbic acid (VITAMIN C) 500 MG tablet Take 500 mg by mouth every morning.      aspirin 81 MG chewable tablet Chew 1 tablet (81 mg total) by mouth daily. 30 tablet 0   atorvastatin (LIPITOR) 80 MG tablet Take 80 mg by mouth at bedtime.     azithromycin (ZITHROMAX) 500 MG tablet Take 1 tablet (500 mg total) by mouth daily at 6 PM. 5 tablet 0   B Complex Vitamins (B COMPLEX 100 PO) Take 1 tablet by mouth every morning.      carvedilol (  COREG) 6.25 MG tablet Take 1 tablet (6.25 mg total) by mouth 2 (two) times daily with a meal. 60 tablet 0   cephALEXin (KEFLEX) 500 MG  capsule Take 1 capsule (500 mg total) by mouth 3 (three) times daily for 5 days. 15 capsule 0   cetirizine (ZYRTEC) 10 MG tablet Take 10 mg by mouth in the morning.     metFORMIN (GLUCOPHAGE) 1000 MG tablet Take 1,000 mg by mouth 2 (two) times daily.     pantoprazole (PROTONIX) 40 MG tablet Take 1 tablet (40 mg total) by mouth daily. 30 tablet 0   tamsulosin (FLOMAX) 0.4 MG CAPS capsule Take 1 capsule (0.4 mg total) by mouth daily. (Patient taking differently: Take 0.4 mg by mouth every evening.) 30 capsule 11   No current facility-administered medications for this visit.     Objective: Vital signs in last 24 hours: BP (!) 158/84    Pulse 89    Wt 225 lb (102.1 kg)    BMI 33.23 kg/m   Intake/Output from previous day: No intake/output data recorded. Intake/Output this shift: @IOTHISSHIFT @   Physical Exam  Lab Results:  Recent Results (from the past 2160 hour(s))  Urinalysis, Routine w reflex microscopic Urine, Clean Catch     Status: None   Collection Time: 05/14/21  8:12 PM  Result Value Ref Range   Color, Urine YELLOW YELLOW   APPearance CLEAR CLEAR   Specific Gravity, Urine 1.013 1.005 - 1.030   pH 5.0 5.0 - 8.0   Glucose, UA NEGATIVE NEGATIVE mg/dL   Hgb urine dipstick NEGATIVE NEGATIVE   Bilirubin Urine NEGATIVE NEGATIVE   Ketones, ur NEGATIVE NEGATIVE mg/dL   Protein, ur NEGATIVE NEGATIVE mg/dL   Nitrite NEGATIVE NEGATIVE   Leukocytes,Ua NEGATIVE NEGATIVE    Comment: Performed at [facility], [address] [place], [place] [address]  Basic metabolic panel     Status: Abnormal   Collection Time: 05/14/21  8:27 PM  Result Value Ref Range   Sodium 137 135 - 145 mmol/L   Potassium 3.6 3.5 - 5.1 mmol/L   Chloride 102 98 - 111 mmol/L   CO2 29 22 - 32 mmol/L   Glucose, Bld 124 (H) 70 - 99 mg/dL    Comment: Glucose reference range applies only to samples taken after fasting for at least 8 hours.   BUN 23 8 - 23 mg/dL   Creatinine, Ser 0.91 0.61 - 1.24 mg/dL   Calcium  9.1 8.9 - 10.3 mg/dL   GFR, Estimated >60 >60 mL/min    Comment: (NOTE) Calculated using the CKD-EPI Creatinine Equation (2021)    Anion gap 6 5 - 15    Comment: Performed at [facility], [address] [place], [place] [address]  CBC     Status: Abnormal   Collection Time: 05/14/21  8:27 PM  Result Value Ref Range   WBC 6.1 4.0 - 10.5 K/uL   RBC 3.83 (L) 4.22 - 5.81 MIL/uL   Hemoglobin 12.9 (L) 13.0 - 17.0 g/dL   HCT 38.6 (L) 39.0 - 52.0 %   MCV 100.8 (H) 80.0 - 100.0 fL   MCH 33.7 26.0 - 34.0 pg   MCHC 33.4 30.0 - 36.0 g/dL   RDW 16.6 (H) 11.5 - 15.5 %   Platelets 169 150 - 400 K/uL   nRBC 0.3 (H) 0.0 - 0.2 %    Comment: Performed at [facility], [address] [place], [place] [address]  Brain natriuretic peptide  Status: None   Collection Time: 05/14/21  8:27 PM  Result Value Ref Range   B Natriuretic Peptide 88.0 0.0 - 100.0 pg/mL    Comment: Performed at [facility], [address], [place], [place] [address]  Troponin I (High Sensitivity)     Status: None   Collection Time: 05/14/21  8:27 PM  Result Value Ref Range   Troponin I (High Sensitivity) 15 <18 ng/L    Comment: (NOTE) Elevated high sensitivity troponin I (hsTnI) values and significant  changes across serial measurements may suggest ACS but many other  chronic and acute conditions are known to elevate hsTnI results.  Refer to the "Links" section for chest pain algorithms and additional  guidance. Performed at [facility], [address], [place], [place] [address]   HIV Antibody (routine testing w rflx)     Status: None   Collection Time: 05/14/21  8:27 PM  Result Value Ref Range   HIV Screen 4th Generation wRfx Non Reactive Non Reactive    Comment: Performed at [facility], [place] [address], [place], [place] [address]  CBG monitoring, ED     Status: Abnormal   Collection Time: 05/14/21  9:36 PM  Result Value Ref Range   Glucose-Capillary 106 (H) 70 - 99 mg/dL    Comment: Glucose reference range  applies only to samples taken after fasting for at least 8 hours.  Resp Panel by RT-PCR (Flu A&B, Covid) Urine, Clean Catch     Status: None   Collection Time: 05/14/21 10:45 PM   Specimen: Urine, Clean Catch; Nasopharyngeal(NP) swabs in vial transport medium  Result Value Ref Range   SARS Coronavirus 2 by RT PCR NEGATIVE NEGATIVE    Comment: (NOTE) SARS-CoV-2 target nucleic acids are NOT DETECTED.  The SARS-CoV-2 RNA is generally detectable in upper respiratory specimens during the acute phase of infection. The lowest concentration of SARS-CoV-2 viral copies this assay can detect is 138 copies/mL. A negative result does not preclude SARS-Cov-2 infection and should not be used as the sole basis for treatment or other patient management decisions. A negative result may occur with  improper specimen collection/handling, submission of specimen other than nasopharyngeal swab, presence of viral mutation(s) within the areas targeted by this assay, and inadequate number of viral copies(<138 copies/mL). A negative result must be combined with clinical observations, patient history, and epidemiological information. The expected result is Negative.  Fact Sheet for Patients:  EntrepreneurPulse.com.au  Fact Sheet for Healthcare Providers:  IncredibleEmployment.be  This test is no t yet approved or cleared by the [place] FDA and  has been authorized for detection and/or diagnosis of SARS-CoV-2 by FDA under an Emergency Use Authorization (EUA). This EUA will remain  in effect (meaning this test can be used) for the duration of the COVID-19 declaration under Section 564(b)(1) of the Act, 21 U.S.C.section 360bbb-3(b)(1), unless the authorization is terminated  or revoked sooner.       Influenza A by PCR NEGATIVE NEGATIVE   Influenza B by PCR NEGATIVE NEGATIVE    Comment: (NOTE) The Xpert Xpress SARS-CoV-2/FLU/RSV plus assay is intended as an aid in  the diagnosis of influenza from Nasopharyngeal swab specimens and should not be used as a sole basis for treatment. Nasal washings and aspirates are unacceptable for Xpert Xpress SARS-CoV-2/FLU/RSV testing.  Fact Sheet for Patients: EntrepreneurPulse.com.au  Fact Sheet for Healthcare Providers: IncredibleEmployment.be  This test is not yet approved or cleared by the [place] and has been authorized for  detection and/or diagnosis of SARS-CoV-2 by FDA under an Emergency Use Authorization (EUA). This EUA will remain in effect (meaning this test can be used) for the duration of the COVID-19 declaration under Section 564(b)(1) of the Act, 21 U.S.C. section 360bbb-3(b)(1), unless the authorization is terminated or revoked.  Performed at [facility], [address] [place], [place] [address]   Folate     Status: None   Collection Time: 05/15/21  5:32 AM  Result Value Ref Range   Folate 17.4 >5.9 ng/mL    Comment: Performed at [facility], [address], [place], [place] [address]  Vitamin B12     Status: None   Collection Time: 05/15/21  5:32 AM  Result Value Ref Range   Vitamin B-12 220 180 - 914 pg/mL    Comment: (NOTE) This assay is not validated for testing neonatal or myeloproliferative syndrome specimens for Vitamin B12 levels. Performed at [facility], [address] [place], [place] [address]   Comprehensive metabolic panel     Status: Abnormal   Collection Time: 05/15/21  5:32 AM  Result Value Ref Range   Sodium 140 135 - 145 mmol/L   Potassium 3.8 3.5 - 5.1 mmol/L   Chloride 102 98 - 111 mmol/L   CO2 29 22 - 32 mmol/L   Glucose, Bld 136 (H) 70 - 99 mg/dL    Comment: Glucose reference range applies only to samples taken after fasting for at least 8 hours.   BUN 20 8 - 23 mg/dL   Creatinine, Ser 0.88 0.61 - 1.24 mg/dL   Calcium 9.2 8.9 - 10.3 mg/dL   Total Protein 6.1 (L) 6.5 - 8.1 g/dL   Albumin 3.4 (L) 3.5 - 5.0 g/dL    AST 14 (L) 15 - 41 U/L   ALT 28 0 - 44 U/L   Alkaline Phosphatase 60 38 - 126 U/L   Total Bilirubin 0.5 0.3 - 1.2 mg/dL   GFR, Estimated >60 >60 mL/min    Comment: (NOTE) Calculated using the CKD-EPI Creatinine Equation (2021)    Anion gap 9 5 - 15    Comment: Performed at [facility], [address], [place], [place] [address]  CBC     Status: Abnormal   Collection Time: 05/15/21  5:32 AM  Result Value Ref Range   WBC 6.9 4.0 - 10.5 K/uL   RBC 3.67 (L) 4.22 - 5.81 MIL/uL   Hemoglobin 12.0 (L) 13.0 - 17.0 g/dL   HCT 37.5 (L) 39.0 - 52.0 %   MCV 102.2 (H) 80.0 - 100.0 fL   MCH 32.7 26.0 - 34.0 pg   MCHC 32.0 30.0 - 36.0 g/dL   RDW 16.8 (H) 11.5 - 15.5 %   Platelets 175 150 - 400 K/uL   nRBC 0.0 0.0 - 0.2 %    Comment: Performed at [facility], [address] [place],  [address]  Protime-INR     Status: None   Collection Time: 05/15/21  5:32 AM  Result Value Ref Range   Prothrombin Time 12.8 11.4 - 15.2 seconds   INR 1.0 0.8 - 1.2    Comment: (NOTE) INR goal varies based on device and disease states. Performed at [facility], [address] [place],  [address]   APTT     Status: Abnormal   Collection Time: 05/15/21  5:32 AM  Result Value Ref Range   aPTT <20 (L) 24 - 36 seconds    Comment: Performed at [facility], [place]  [address] [place], [place] [address]  Phosphorus     Status: None   Collection Time: 05/15/21  5:32 AM  Result Value Ref Range   Phosphorus 3.5 2.5 - 4.6 mg/dL    Comment: Performed at [facility], [address], [place], [place] [address]  Troponin I (High Sensitivity)     Status: Abnormal   Collection Time: 05/15/21  5:32 AM  Result Value Ref Range   Troponin I (High Sensitivity) 19 (H) <18 ng/L    Comment: (NOTE) Elevated high sensitivity troponin I (hsTnI) values and significant  changes across serial measurements may suggest ACS but many other  chronic and acute conditions are known to elevate hsTnI results.  Refer to the "Links"  section for chest pain algorithms and additional  guidance. Performed at [facility], [address], [place], [place] [address]   ECHOCARDIOGRAM COMPLETE     Status: None   Collection Time: 05/15/21  9:54 AM  Result Value Ref Range   Weight 4,064 oz   Height 69 in   BP 150/70 mmHg   Single Plane A2C EF 52.1 %   Single Plane A4C EF 60.5 %   Calc EF 58.7 %   AR max vel 2.78 cm2   AV Area VTI 2.83 cm2   AV Mean grad 6.0 mmHg   AV Peak grad 11.0 mmHg   Ao pk vel 1.66 m/s   AV Area mean vel 2.72 cm2   MV VTI 3.51 cm2   Area-P 1/2 3.60 cm2   S' Lateral 2.60 cm  I-STAT 7, (LYTES, BLD GAS, ICA, H+H)     Status: Abnormal   Collection Time: 05/23/21  8:20 AM  Result Value Ref Range   pH, Arterial 7.436 7.350 - 7.450   pCO2 arterial 40.9 32.0 - 48.0 mmHg   pO2, Arterial 174 (H) 83.0 - 108.0 mmHg   Bicarbonate 27.6 20.0 - 28.0 mmol/L   TCO2 29 22 - 32 mmol/L   O2 Saturation 100.0 %   Acid-Base Excess 3.0 (H) 0.0 - 2.0 mmol/L   Sodium 141 135 - 145 mmol/L   Potassium 3.2 (L) 3.5 - 5.1 mmol/L   Calcium, Ion 1.06 (L) 1.15 - 1.40 mmol/L   HCT 31.0 (L) 39.0 - 52.0 %   Hemoglobin 10.5 (L) 13.0 - 17.0 g/dL   Sample type ARTERIAL   POCT I-Stat EG7     Status: Abnormal   Collection Time: 05/23/21  8:23 AM  Result Value Ref Range   pH, Ven 7.391 7.250 - 7.430   pCO2, Ven 47.6 44.0 - 60.0 mmHg   pO2, Ven 39.0 32.0 - 45.0 mmHg   Bicarbonate 28.9 (H) 20.0 - 28.0 mmol/L   TCO2 30 22 - 32 mmol/L   O2 Saturation 73.0 %   Acid-Base Excess 3.0 (H) 0.0 - 2.0 mmol/L   Sodium 138 135 - 145 mmol/L   Potassium 3.4 (L) 3.5 - 5.1 mmol/L   Calcium, Ion 1.24 1.15 - 1.40 mmol/L   HCT 31.0 (L) 39.0 - 52.0 %   Hemoglobin 10.5 (L) 13.0 - 17.0 g/dL   Sample type MIXED VENOUS SAMPLE    Comment NOTIFIED PHYSICIAN   POCT I-Stat EG7     Status: Abnormal   Collection Time: 05/23/21  8:25 AM  Result Value Ref Range   pH, Ven 7.374 7.250 - 7.430   pCO2, Ven 46.6 44.0 - 60.0 mmHg   pO2, Ven 40.0 32.0 - 45.0  mmHg   Bicarbonate 27.2 20.0 - 28.0 mmol/L  TCO2 29 22 - 32 mmol/L   O2 Saturation 74.0 %   Acid-Base Excess 2.0 0.0 - 2.0 mmol/L   Sodium 139 135 - 145 mmol/L   Potassium 3.2 (L) 3.5 - 5.1 mmol/L   Calcium, Ion 1.13 (L) 1.15 - 1.40 mmol/L   HCT 31.0 (L) 39.0 - 52.0 %   Hemoglobin 10.5 (L) 13.0 - 17.0 g/dL   Sample type MIXED VENOUS SAMPLE   POCT I-Stat EG7     Status: Abnormal   Collection Time: 05/23/21  8:38 AM  Result Value Ref Range   pH, Ven 7.362 7.250 - 7.430   pCO2, Ven 42.7 (L) 44.0 - 60.0 mmHg   pO2, Ven 42.0 32.0 - 45.0 mmHg   Bicarbonate 24.2 20.0 - 28.0 mmol/L   TCO2 25 22 - 32 mmol/L   O2 Saturation 76.0 %   Acid-base deficit 1.0 0.0 - 2.0 mmol/L   Sodium 144 135 - 145 mmol/L   Potassium 2.6 (LL) 3.5 - 5.1 mmol/L   Calcium, Ion 0.84 (LL) 1.15 - 1.40 mmol/L   HCT 26.0 (L) 39.0 - 52.0 %   Hemoglobin 8.8 (L) 13.0 - 17.0 g/dL   Sample type VENOUS    Comment NOTIFIED PHYSICIAN   Surgical pcr screen     Status: None   Collection Time: 06/02/21  3:21 PM   Specimen: Nasal Mucosa; Nasal Swab  Result Value Ref Range   MRSA, PCR NEGATIVE NEGATIVE   Staphylococcus aureus NEGATIVE NEGATIVE    Comment: (NOTE) The Xpert SA Assay (FDA approved for NASAL specimens in patients 46 years of age and older), is one component of a comprehensive surveillance program. It is not intended to diagnose infection nor to guide or monitor treatment. Performed at [facility], [place] [address], [place], [place] [address]   SARS CORONAVIRUS 2 (TAT 6-24 HRS) Nasopharyngeal Nasopharyngeal Swab     Status: None   Collection Time: 06/02/21  3:23 PM   Specimen: Nasopharyngeal Swab  Result Value Ref Range   SARS Coronavirus 2 NEGATIVE NEGATIVE    Comment: (NOTE) SARS-CoV-2 target nucleic acids are NOT DETECTED.  The SARS-CoV-2 RNA is generally detectable in upper and lower respiratory specimens during the acute phase of infection. Negative results do not preclude SARS-CoV-2 infection,  do not rule out co-infections with other pathogens, and should not be used as the sole basis for treatment or other patient management decisions. Negative results must be combined with clinical observations, patient history, and epidemiological information. The expected result is Negative.  Fact Sheet for Patients: SugarRoll.be  Fact Sheet for Healthcare Providers: https://www.woods-mathews.com/  This test is not yet approved or cleared by the [place] FDA and  has been authorized for detection and/or diagnosis of SARS-CoV-2 by FDA under an Emergency Use Authorization (EUA). This EUA will remain  in effect (meaning this test can be used) for the duration of the COVID-19 declaration under Se ction 564(b)(1) of the Act, 21 U.S.C. section 360bbb-3(b)(1), unless the authorization is terminated or revoked sooner.  Performed at [facility], [place] [address] [place], [place] [address]   Glucose, capillary     Status: Abnormal   Collection Time: 06/02/21  3:23 PM  Result Value Ref Range   Glucose-Capillary 121 (H) 70 - 99 mg/dL    Comment: Glucose reference range applies only to samples taken after fasting for at least 8 hours.  Basic metabolic panel per protocol     Status: Abnormal   Collection Time: 06/02/21  3:25 PM  Result Value Ref Range   Sodium 136 135 - 145 mmol/L   Potassium 3.5 3.5 - 5.1 mmol/L   Chloride 99 98 - 111 mmol/L   CO2 26 22 - 32 mmol/L   Glucose, Bld 108 (H) 70 - 99 mg/dL    Comment: Glucose reference range applies only to samples taken after fasting for at least 8 hours.   BUN 26 (H) 8 - 23 mg/dL   Creatinine, Ser 1.12 0.61 - 1.24 mg/dL   Calcium 8.9 8.9 - 10.3 mg/dL   GFR, Estimated >60 >60 mL/min    Comment: (NOTE) Calculated using the CKD-EPI Creatinine Equation (2021)    Anion gap 11 5 - 15    Comment: Performed at [place] [address], [place], [place] [address]  Hemoglobin A1c per  protocol     Status: Abnormal   Collection Time: 06/02/21  3:25 PM  Result Value Ref Range   Hgb A1c MFr Bld 6.7 (H) 4.8 - 5.6 %    Comment: (NOTE) Pre diabetes:          5.7%-6.4%  Diabetes:              >6.4%  Glycemic control for   <7.0% adults with diabetes    Mean Plasma Glucose 145.59 mg/dL    Comment: Performed at [place] [place] [address], [place], [place] [address]  CBC per protocol     Status: Abnormal   Collection Time: 06/02/21  3:25 PM  Result Value Ref Range   WBC 5.9 4.0 - 10.5 K/uL   RBC 3.78 (L) 4.22 - 5.81 MIL/uL   Hemoglobin 12.6 (L) 13.0 - 17.0 g/dL   HCT 37.5 (L) 39.0 - 52.0 %   MCV 99.2 80.0 - 100.0 fL   MCH 33.3 26.0 - 34.0 pg   MCHC 33.6 30.0 - 36.0 g/dL   RDW 16.1 (H) 11.5 - 15.5 %   Platelets 201 150 - 400 K/uL   nRBC 0.0 0.0 - 0.2 %    Comment: Performed at [facility], [place] [address] [place], [place] [address]  Type and screen     Status: None   Collection Time: 06/02/21  3:54 PM  Result Value Ref Range   ABO/RH(D) A POS    Antibody Screen NEG    Sample Expiration 06/16/2021,2359    Extend sample reason      NO TRANSFUSIONS OR PREGNANCY IN THE PAST 3 MONTHS Performed at [facility], [place] [address] [place], [place] [address]   ABO/Rh     Status: None   Collection Time: 06/03/21  8:33 AM  Result Value Ref Range   ABO/RH(D)      A POS Performed at [place] [address], [place], [place] [address]   Glucose, capillary     Status: Abnormal   Collection Time: 06/03/21 10:29 AM  Result Value Ref Range   Glucose-Capillary 140 (H) 70 - 99 mg/dL    Comment: Glucose reference range applies only to samples taken after fasting for at least 8 hours.  Glucose, capillary     Status: Abnormal   Collection Time: 06/03/21  2:01 PM  Result Value Ref Range   Glucose-Capillary 120 (H) 70 - 99 mg/dL    Comment: Glucose reference range applies only to samples taken after fasting for at least 8 hours.  Glucose, capillary      Status: Abnormal   Collection Time:  06/03/21  9:06 PM  Result Value Ref Range   Glucose-Capillary 166 (H) 70 - 99 mg/dL    Comment: Glucose reference range applies only to samples taken after fasting for at least 8 hours.   Comment 1 Notify RN    Comment 2 Document in Chart   Glucose, capillary     Status: Abnormal   Collection Time: 06/04/21  6:43 AM  Result Value Ref Range   Glucose-Capillary 146 (H) 70 - 99 mg/dL    Comment: Glucose reference range applies only to samples taken after fasting for at least 8 hours.   Comment 1 Notify RN    Comment 2 Document in Chart   B Nat Peptide     Status: None   Collection Time: 06/11/21  4:08 PM  Result Value Ref Range   B Natriuretic Peptide 61.6 0.0 - 100.0 pg/mL    Comment: Performed at [facility], [place] [address] [place], [place] [address]  Basic metabolic panel     Status: Abnormal   Collection Time: 06/11/21  4:19 PM  Result Value Ref Range   Sodium 136 135 - 145 mmol/L   Potassium 3.2 (L) 3.5 - 5.1 mmol/L   Chloride 96 (L) 98 - 111 mmol/L   CO2 28 22 - 32 mmol/L   Glucose, Bld 113 (H) 70 - 99 mg/dL    Comment: Glucose reference range applies only to samples taken after fasting for at least 8 hours.   BUN 23 8 - 23 mg/dL   Creatinine, Ser 1.04 0.61 - 1.24 mg/dL   Calcium 8.7 (L) 8.9 - 10.3 mg/dL   GFR, Estimated >60 >60 mL/min    Comment: (NOTE) Calculated using the CKD-EPI Creatinine Equation (2021)    Anion gap 12 5 - 15    Comment: Performed at [facility] [address], [place], [place] [address]  Basic Metabolic Panel (BMET)     Status: Abnormal   Collection Time: 06/30/21  9:50 AM  Result Value Ref Range   Sodium 138 135 - 145 mmol/L   Potassium 4.0 3.5 - 5.1 mmol/L   Chloride 99 98 - 111 mmol/L   CO2 29 22 - 32 mmol/L   Glucose, Bld 129 (H) 70 - 99 mg/dL    Comment: Glucose reference range applies only to samples taken after fasting for at least 8 hours.   BUN 12 8 - 23 mg/dL   Creatinine, Ser  1.33 (H) 0.61 - 1.24 mg/dL   Calcium 9.3 8.9 - 10.3 mg/dL   GFR, Estimated 59 (L) >60 mL/min    Comment: (NOTE) Calculated using the CKD-EPI Creatinine Equation (2021)    Anion gap 10 5 - 15    Comment: Performed at [facility] [address], [place], [place] [address]  Troponin I (High Sensitivity)     Status: Abnormal   Collection Time: 07/30/21  2:04 PM  Result Value Ref Range   Troponin I (High Sensitivity) 18 (H) <18 ng/L    Comment: (NOTE) Elevated high sensitivity troponin I (hsTnI) values and significant  changes across serial measurements may suggest ACS but many other  chronic and acute conditions are known to elevate hsTnI results.  Refer to the "Links" section for chest pain algorithms and additional  guidance. Performed at [facility], [place] [address] [place], [place] [address]   Basic metabolic panel     Status: Abnormal   Collection Time: 07/30/21  2:28 PM  Result  Value Ref Range   Sodium 138 135 - 145 mmol/L   Potassium 3.3 (L) 3.5 - 5.1 mmol/L   Chloride 99 98 - 111 mmol/L   CO2 21 (L) 22 - 32 mmol/L   Glucose, Bld 88 70 - 99 mg/dL    Comment: Glucose reference range applies only to samples taken after fasting for at least 8 hours.   BUN 8 8 - 23 mg/dL   Creatinine, Ser 1.21 0.61 - 1.24 mg/dL   Calcium 9.6 8.9 - 10.3 mg/dL   GFR, Estimated >60 >60 mL/min    Comment: (NOTE) Calculated using the CKD-EPI Creatinine Equation (2021)    Anion gap 18 (H) 5 - 15    Comment: Performed at [facility] [address], [place], [place] [address]  CBC     Status: Abnormal   Collection Time: 07/30/21  2:28 PM  Result Value Ref Range   WBC 5.5 4.0 - 10.5 K/uL   RBC 3.64 (L) 4.22 - 5.81 MIL/uL   Hemoglobin 11.8 (L) 13.0 - 17.0 g/dL   HCT 36.6 (L) 39.0 - 52.0 %   MCV 100.5 (H) 80.0 - 100.0 fL   MCH 32.4 26.0 - 34.0 pg   MCHC 32.2 30.0 - 36.0 g/dL   RDW 13.2 11.5 - 15.5 %   Platelets 308 150 - 400 K/uL   nRBC 0.0 0.0 - 0.2 %    Comment:  Performed at [facility], [place] [address], [place], [place] [address]  Troponin I (High Sensitivity)     Status: Abnormal   Collection Time: 07/30/21  2:28 PM  Result Value Ref Range   Troponin I (High Sensitivity) 18 (H) <18 ng/L    Comment: (NOTE) Elevated high sensitivity troponin I (hsTnI) values and significant  changes across serial measurements may suggest ACS but many other  chronic and acute conditions are known to elevate hsTnI results.  Refer to the "Links" section for chest pain algorithms and additional  guidance. Performed at [facility], [place] [address], [place], [place] [address]   Brain natriuretic peptide     Status: None   Collection Time: 07/30/21  2:41 PM  Result Value Ref Range   B Natriuretic Peptide 46.1 0.0 - 100.0 pg/mL    Comment: Performed at [facility] [address] [place], [place] [address]  Hepatic function panel     Status: Abnormal   Collection Time: 07/30/21  2:41 PM  Result Value Ref Range   Total Protein 7.2 6.5 - 8.1 g/dL   Albumin 3.6 3.5 - 5.0 g/dL   AST 14 (L) 15 - 41 U/L   ALT 12 0 - 44 U/L   Alkaline Phosphatase 86 38 - 126 U/L   Total Bilirubin 1.5 (H) 0.3 - 1.2 mg/dL   Bilirubin, Direct 0.2 0.0 - 0.2 mg/dL   Indirect Bilirubin 1.3 (H) 0.3 - 0.9 mg/dL    Comment: Performed at [facility] [address] [place], [place] [address]  Lipase, blood     Status: None   Collection Time: 07/30/21  2:41 PM  Result Value Ref Range   Lipase 25 11 - 51 U/L    Comment: Performed at [facility], [place] [address] [place],  [address]  CBC with Differential/Platelet     Status: Abnormal   Collection Time: 08/01/21  2:34 PM  Result Value Ref Range   WBC 6.0 4.0 - 10.5 K/uL   RBC  3.92 (L) 4.22 - 5.81 MIL/uL   Hemoglobin 12.7 (L) 13.0 - 17.0 g/dL   HCT 39.4 39.0 - 52.0 %   MCV 100.5 (H) 80.0 - 100.0 fL   MCH 32.4 26.0 - 34.0 pg   MCHC 32.2 30.0 - 36.0 g/dL   RDW 13.4 11.5 - 15.5 %   Platelets 325 150 -  400 K/uL   nRBC 0.0 0.0 - 0.2 %   Neutrophils Relative % 65 %   Neutro Abs 3.9 1.7 - 7.7 K/uL   Lymphocytes Relative 23 %   Lymphs Abs 1.4 0.7 - 4.0 K/uL   Monocytes Relative 11 %   Monocytes Absolute 0.6 0.1 - 1.0 K/uL   Eosinophils Relative 1 %   Eosinophils Absolute 0.1 0.0 - 0.5 K/uL   Basophils Relative 0 %   Basophils Absolute 0.0 0.0 - 0.1 K/uL   Immature Granulocytes 0 %   Abs Immature Granulocytes 0.02 0.00 - 0.07 K/uL    Comment: Performed at [facility] [address], [place], [place] [address]  Comprehensive metabolic panel     Status: Abnormal   Collection Time: 08/01/21  2:34 PM  Result Value Ref Range   Sodium 139 135 - 145 mmol/L   Potassium 3.2 (L) 3.5 - 5.1 mmol/L   Chloride 101 98 - 111 mmol/L   CO2 22 22 - 32 mmol/L   Glucose, Bld 102 (H) 70 - 99 mg/dL    Comment: Glucose reference range applies only to samples taken after fasting for at least 8 hours.   BUN 9 8 - 23 mg/dL   Creatinine, Ser 1.50 (H) 0.61 - 1.24 mg/dL   Calcium 9.6 8.9 - 10.3 mg/dL   Total Protein 7.5 6.5 - 8.1 g/dL   Albumin 3.8 3.5 - 5.0 g/dL   AST 15 15 - 41 U/L   ALT 13 0 - 44 U/L   Alkaline Phosphatase 88 38 - 126 U/L   Total Bilirubin 0.9 0.3 - 1.2 mg/dL   GFR, Estimated 51 (L) >60 mL/min    Comment: (NOTE) Calculated using the CKD-EPI Creatinine Equation (2021)    Anion gap 16 (H) 5 - 15    Comment: Performed at [facility] [address], [place], [place] [address]  Respiratory (~20 pathogens) panel by PCR     Status: None   Collection Time: 08/01/21  6:00 PM   Specimen: Nasopharyngeal Swab; Respiratory  Result Value Ref Range   Adenovirus NOT DETECTED NOT DETECTED   Coronavirus 229E NOT DETECTED NOT DETECTED    Comment: (NOTE) The Coronavirus on the Respiratory Panel, DOES NOT test for the novel  Coronavirus (2019 nCoV)    Coronavirus HKU1 NOT DETECTED NOT DETECTED   Coronavirus NL63 NOT DETECTED NOT DETECTED   Coronavirus OC43 NOT DETECTED NOT DETECTED    Metapneumovirus NOT DETECTED NOT DETECTED   Rhinovirus / Enterovirus NOT DETECTED NOT DETECTED   Influenza A NOT DETECTED NOT DETECTED   Influenza B NOT DETECTED NOT DETECTED   Parainfluenza Virus 1 NOT DETECTED NOT DETECTED   Parainfluenza Virus 2 NOT DETECTED NOT DETECTED   Parainfluenza Virus 3 NOT DETECTED NOT DETECTED   Parainfluenza Virus 4 NOT DETECTED NOT DETECTED   Respiratory Syncytial Virus NOT DETECTED NOT DETECTED   Bordetella pertussis NOT DETECTED NOT DETECTED   Bordetella Parapertussis NOT DETECTED NOT DETECTED   Chlamydophila pneumoniae NOT DETECTED NOT DETECTED   Mycoplasma pneumoniae NOT DETECTED NOT DETECTED    Comment: Performed at  [facility], [place] [address], [place], [place] [address]  Resp Panel by RT-PCR (Flu A&B, Covid) Nasopharyngeal Swab     Status: None   Collection Time: 08/01/21  6:02 PM   Specimen: Nasopharyngeal Swab; Nasopharyngeal(NP) swabs in vial transport medium  Result Value Ref Range   SARS Coronavirus 2 by RT PCR NEGATIVE NEGATIVE    Comment: (NOTE) SARS-CoV-2 target nucleic acids are NOT DETECTED.  The SARS-CoV-2 RNA is generally detectable in upper respiratory specimens during the acute phase of infection. The lowest concentration of SARS-CoV-2 viral copies this assay can detect is 138 copies/mL. A negative result does not preclude SARS-Cov-2 infection and should not be used as the sole basis for treatment or other patient management decisions. A negative result may occur with  improper specimen collection/handling, submission of specimen other than nasopharyngeal swab, presence of viral mutation(s) within the areas targeted by this assay, and inadequate number of viral copies(<138 copies/mL). A negative result must be combined with clinical observations, patient history, and epidemiological information. The expected result is Negative.  Fact Sheet for Patients:  EntrepreneurPulse.com.au  Fact Sheet for  Healthcare Providers:  IncredibleEmployment.be  This test is no t yet approved or cleared by the [place] FDA and  has been authorized for detection and/or diagnosis of SARS-CoV-2 by FDA under an Emergency Use Authorization (EUA). This EUA will remain  in effect (meaning this test can be used) for the duration of the COVID-19 declaration under Section 564(b)(1) of the Act, 21 U.S.C.section 360bbb-3(b)(1), unless the authorization is terminated  or revoked sooner.       Influenza A by PCR NEGATIVE NEGATIVE   Influenza B by PCR NEGATIVE NEGATIVE    Comment: (NOTE) The Xpert Xpress SARS-CoV-2/FLU/RSV plus assay is intended as an aid in the diagnosis of influenza from Nasopharyngeal swab specimens and should not be used as a sole basis for treatment. Nasal washings and aspirates are unacceptable for Xpert Xpress SARS-CoV-2/FLU/RSV testing.  Fact Sheet for Patients: EntrepreneurPulse.com.au  Fact Sheet for Healthcare Providers: IncredibleEmployment.be  This test is not yet approved or cleared by the [place] FDA and has been authorized for detection and/or diagnosis of SARS-CoV-2 by FDA under an Emergency Use Authorization (EUA). This EUA will remain in effect (meaning this test can be used) for the duration of the COVID-19 declaration under Section 564(b)(1) of the Act, 21 U.S.C. section 360bbb-3(b)(1), unless the authorization is terminated or revoked.  Performed at [facility], [place] [address], [place], [place] [address]   Troponin I (High Sensitivity)     Status: Abnormal   Collection Time: 08/01/21  6:03 PM  Result Value Ref Range   Troponin I (High Sensitivity) 27 (H) <18 ng/L    Comment: (NOTE) Elevated high sensitivity troponin I (hsTnI) values and significant  changes across serial measurements may suggest ACS but many other  chronic and acute conditions are known to elevate hsTnI results.  Refer  to the "Links" section for chest pain algorithms and additional  guidance. Performed at [facility], [place] [address], [place], [place] [address]   Urinalysis, Routine w reflex microscopic Urine, Clean Catch     Status: Abnormal   Collection Time: 08/01/21  7:32 PM  Result Value Ref Range   Color, Urine YELLOW YELLOW   APPearance HAZY (A) CLEAR   Specific Gravity, Urine 1.015 1.005 - 1.030   pH 5.0 5.0 - 8.0   Glucose, UA NEGATIVE NEGATIVE mg/dL   Hgb urine dipstick  NEGATIVE NEGATIVE   Bilirubin Urine NEGATIVE NEGATIVE   Ketones, ur 80 (A) NEGATIVE mg/dL   Protein, ur 30 (A) NEGATIVE mg/dL   Nitrite NEGATIVE NEGATIVE   Leukocytes,Ua NEGATIVE NEGATIVE   RBC / HPF 0-5 0 - 5 RBC/hpf   WBC, UA 0-5 0 - 5 WBC/hpf   Bacteria, UA RARE (A) NONE SEEN   Squamous Epithelial / LPF 0-5 0 - 5   Mucus PRESENT    Hyaline Casts, UA PRESENT     Comment: Performed at [facility], [place] [address], [place], [place] [address]  Troponin I (High Sensitivity)     Status: Abnormal   Collection Time: 08/01/21  8:03 PM  Result Value Ref Range   Troponin I (High Sensitivity) 28 (H) <18 ng/L    Comment: (NOTE) Elevated high sensitivity troponin I (hsTnI) values and significant  changes across serial measurements may suggest ACS but many other  chronic and acute conditions are known to elevate hsTnI results.  Refer to the "Links" section for chest pain algorithms and additional  guidance. Performed at [facility], [place] [address], [place], [place] [address]   Lactic acid, plasma     Status: None   Collection Time: 08/01/21 11:18 PM  Result Value Ref Range   Lactic Acid, Venous 1.0 0.5 - 1.9 mmol/L    Comment: Performed at [facility] [address] [place], [place] [address]  Blood culture (routine x 2)     Status: None   Collection Time: 08/01/21 11:18 PM   Specimen: BLOOD  Result Value Ref Range   Specimen Description BLOOD LEFT ANTECUBITAL    Special Requests       BOTTLES DRAWN AEROBIC AND ANAEROBIC Blood Culture adequate volume   Culture      NO GROWTH 5 DAYS Performed at [facility], [place] [address], [place], [place] [address]    Report Status 08/07/2021 FINAL   HIV Antibody (routine testing w rflx)     Status: None   Collection Time: 08/01/21 11:18 PM  Result Value Ref Range   HIV Screen 4th Generation wRfx Non Reactive Non Reactive    Comment: Performed at [facility], [place] [address], [place], [place] [address]  CBG monitoring, ED     Status: None   Collection Time: 08/02/21 12:07 AM  Result Value Ref Range   Glucose-Capillary 89 70 - 99 mg/dL    Comment: Glucose reference range applies only to samples taken after fasting for at least 8 hours.  Glucose, capillary     Status: None   Collection Time: 08/02/21  2:30 AM  Result Value Ref Range   Glucose-Capillary 99 70 - 99 mg/dL    Comment: Glucose reference range applies only to samples taken after fasting for at least 8 hours.  Lactic acid, plasma     Status: None   Collection Time: 08/02/21  4:36 AM  Result Value Ref Range   Lactic Acid, Venous 0.8 0.5 - 1.9 mmol/L    Comment: Performed at [facility] [address], [place], [place] [address]  CBC     Status: Abnormal   Collection Time: 08/02/21  4:36 AM  Result Value Ref Range   WBC 5.8 4.0 - 10.5 K/uL   RBC 3.33 (L) 4.22 - 5.81 MIL/uL   Hemoglobin 11.0 (L) 13.0 - 17.0 g/dL   HCT 33.2 (L) 39.0 - 52.0 %   MCV 99.7 80.0 - 100.0 fL  MCH 33.0 26.0 - 34.0 pg   MCHC 33.1 30.0 - 36.0 g/dL   RDW 13.4 11.5 - 15.5 %   Platelets 252 150 - 400 K/uL   nRBC 0.0 0.0 - 0.2 %    Comment: Performed at [facility], [place] [address], [place], [place] [address]  Basic metabolic panel     Status: Abnormal   Collection Time: 08/02/21  4:36 AM  Result Value Ref Range   Sodium 140 135 - 145 mmol/L   Potassium 3.6 3.5 - 5.1 mmol/L   Chloride 105 98 - 111 mmol/L   CO2 21 (L) 22 - 32 mmol/L   Glucose, Bld 119 (H) 70 - 99  mg/dL    Comment: Glucose reference range applies only to samples taken after fasting for at least 8 hours.   BUN 8 8 - 23 mg/dL   Creatinine, Ser 1.51 (H) 0.61 - 1.24 mg/dL   Calcium 8.4 (L) 8.9 - 10.3 mg/dL   GFR, Estimated 50 (L) >60 mL/min    Comment: (NOTE) Calculated using the CKD-EPI Creatinine Equation (2021)    Anion gap 14 5 - 15    Comment: Performed at [facility] [address] [place], [place] [address]  Brain natriuretic peptide     Status: None   Collection Time: 08/02/21  4:36 AM  Result Value Ref Range   B Natriuretic Peptide 74.3 0.0 - 100.0 pg/mL    Comment: Performed at [facility] [address] [place], [place] [address]  Blood culture (routine x 2)     Status: None   Collection Time: 08/02/21  4:44 AM   Specimen: BLOOD  Result Value Ref Range   Specimen Description BLOOD RIGHT ANTECUBITAL    Special Requests      BOTTLES DRAWN AEROBIC AND ANAEROBIC Blood Culture adequate volume   Culture      NO GROWTH 5 DAYS Performed at [facility], [place] [address], [place], [place] [address]    Report Status 08/07/2021 FINAL   Procalcitonin - Baseline     Status: None   Collection Time: 08/02/21  4:53 AM  Result Value Ref Range   Procalcitonin <0.10 ng/mL    Comment:        Interpretation: PCT (Procalcitonin) <= 0.5 ng/mL: Systemic infection (sepsis) is not likely. Local bacterial infection is possible. (NOTE)       Sepsis PCT Algorithm           Lower Respiratory Tract                                      Infection PCT Algorithm    ----------------------------     ----------------------------         PCT < 0.25 ng/mL                PCT < 0.10 ng/mL          Strongly encourage             Strongly discourage   discontinuation of antibiotics    initiation of antibiotics    ----------------------------     -----------------------------       PCT 0.25 - 0.50 ng/mL            PCT 0.10 - 0.25 ng/mL               OR       >  80% decrease in  PCT            Discourage initiation of                                            antibiotics      Encourage discontinuation           of antibiotics    ----------------------------     -----------------------------         PCT >= 0.50 ng/mL              PCT 0.26 - 0.50 ng/mL               AND        <80% decrease in PCT             Encourage initiation of                                             antibiotics       Encourage continuation           of antibiotics    ----------------------------     -----------------------------        PCT >= 0.50 ng/mL                  PCT > 0.50 ng/mL               AND         increase in PCT                  Strongly encourage                                      initiation of antibiotics    Strongly encourage escalation           of antibiotics                                     -----------------------------                                           PCT <= 0.25 ng/mL                                                 OR                                        > 80% decrease in PCT                                      Discontinue / Do not initiate  antibiotics  Performed at [facility], [place] [address] [place], [place] [address]   Lipase, blood     Status: None   Collection Time: 08/02/21  4:53 AM  Result Value Ref Range   Lipase 23 11 - 51 U/L    Comment: Performed at [facility] [address], [place], [place] [address]  Troponin I (High Sensitivity)     Status: Abnormal   Collection Time: 08/02/21  4:53 AM  Result Value Ref Range   Troponin I (High Sensitivity) 24 (H) <18 ng/L    Comment: (NOTE) Elevated high sensitivity troponin I (hsTnI) values and significant  changes across serial measurements may suggest ACS but many other  chronic and acute conditions are known to elevate hsTnI results.  Refer to the "Links" section for chest pain algorithms and additional   guidance. Performed at [facility], [place] [address] [place], [place] [address]   Glucose, capillary     Status: Abnormal   Collection Time: 08/02/21  5:15 AM  Result Value Ref Range   Glucose-Capillary 111 (H) 70 - 99 mg/dL    Comment: Glucose reference range applies only to samples taken after fasting for at least 8 hours.  Glucose, capillary     Status: Abnormal   Collection Time: 08/02/21  7:51 AM  Result Value Ref Range   Glucose-Capillary 123 (H) 70 - 99 mg/dL    Comment: Glucose reference range applies only to samples taken after fasting for at least 8 hours.  Glucose, capillary     Status: None   Collection Time: 08/02/21 11:50 AM  Result Value Ref Range   Glucose-Capillary 89 70 - 99 mg/dL    Comment: Glucose reference range applies only to samples taken after fasting for at least 8 hours.  Glucose, capillary     Status: Abnormal   Collection Time: 08/02/21  3:55 PM  Result Value Ref Range   Glucose-Capillary 114 (H) 70 - 99 mg/dL    Comment: Glucose reference range applies only to samples taken after fasting for at least 8 hours.  Glucose, capillary     Status: None   Collection Time: 08/02/21  9:20 PM  Result Value Ref Range   Glucose-Capillary 92 70 - 99 mg/dL    Comment: Glucose reference range applies only to samples taken after fasting for at least 8 hours.  Glucose, capillary     Status: Abnormal   Collection Time: 08/03/21 12:01 AM  Result Value Ref Range   Glucose-Capillary 107 (H) 70 - 99 mg/dL    Comment: Glucose reference range applies only to samples taken after fasting for at least 8 hours.  Procalcitonin     Status: None   Collection Time: 08/03/21  1:32 AM  Result Value Ref Range   Procalcitonin <0.10 ng/mL    Comment:        Interpretation: PCT (Procalcitonin) <= 0.5 ng/mL: Systemic infection (sepsis) is not likely. Local bacterial infection is possible. (NOTE)       Sepsis PCT Algorithm           Lower Respiratory Tract                                       Infection PCT Algorithm    ----------------------------     ----------------------------         PCT < 0.25 ng/mL  PCT < 0.10 ng/mL          Strongly encourage             Strongly discourage   discontinuation of antibiotics    initiation of antibiotics    ----------------------------     -----------------------------       PCT 0.25 - 0.50 ng/mL            PCT 0.10 - 0.25 ng/mL               OR       >80% decrease in PCT            Discourage initiation of                                            antibiotics      Encourage discontinuation           of antibiotics    ----------------------------     -----------------------------         PCT >= 0.50 ng/mL              PCT 0.26 - 0.50 ng/mL               AND        <80% decrease in PCT             Encourage initiation of                                             antibiotics       Encourage continuation           of antibiotics    ----------------------------     -----------------------------        PCT >= 0.50 ng/mL                  PCT > 0.50 ng/mL               AND         increase in PCT                  Strongly encourage                                      initiation of antibiotics    Strongly encourage escalation           of antibiotics                                     -----------------------------                                           PCT <= 0.25 ng/mL                                                 OR                                        >  80% decrease in PCT                                      Discontinue / Do not initiate                                             antibiotics  Performed at [facility], [place] [address], [place], [place] [address]   Magnesium     Status: Abnormal   Collection Time: 08/03/21  1:32 AM  Result Value Ref Range   Magnesium 1.6 (L) 1.7 - 2.4 mg/dL    Comment: Performed at [facility] [address], [place], [place] [address]   C-reactive protein     Status: None   Collection Time: 08/03/21  1:32 AM  Result Value Ref Range   CRP 0.8 <1.0 mg/dL    Comment: Performed at [facility] [address], [place], [place] [address]  Comprehensive metabolic panel     Status: Abnormal   Collection Time: 08/03/21  1:32 AM  Result Value Ref Range   Sodium 141 135 - 145 mmol/L   Potassium 3.1 (L) 3.5 - 5.1 mmol/L   Chloride 105 98 - 111 mmol/L   CO2 26 22 - 32 mmol/L   Glucose, Bld 108 (H) 70 - 99 mg/dL    Comment: Glucose reference range applies only to samples taken after fasting for at least 8 hours.   BUN 5 (L) 8 - 23 mg/dL   Creatinine, Ser 1.08 0.61 - 1.24 mg/dL   Calcium 8.9 8.9 - 10.3 mg/dL   Total Protein 6.0 (L) 6.5 - 8.1 g/dL   Albumin 2.9 (L) 3.5 - 5.0 g/dL   AST 14 (L) 15 - 41 U/L   ALT 11 0 - 44 U/L   Alkaline Phosphatase 85 38 - 126 U/L   Total Bilirubin 0.9 0.3 - 1.2 mg/dL   GFR, Estimated >60 >60 mL/min    Comment: (NOTE) Calculated using the CKD-EPI Creatinine Equation (2021)    Anion gap 10 5 - 15    Comment: Performed at [facility], [place] [address], [place], [place] [address]  Brain natriuretic peptide     Status: None   Collection Time: 08/03/21  1:32 AM  Result Value Ref Range   B Natriuretic Peptide 66.0 0.0 - 100.0 pg/mL    Comment: Performed at [facility] [address], [place], [place] [address]  CBC with Differential/Platelet     Status: Abnormal   Collection Time: 08/03/21  1:32 AM  Result Value Ref Range   WBC 4.4 4.0 - 10.5 K/uL   RBC 3.28 (L) 4.22 - 5.81 MIL/uL   Hemoglobin 10.8 (L) 13.0 - 17.0 g/dL   HCT 32.1 (L) 39.0 - 52.0 %   MCV 97.9 80.0 - 100.0 fL   MCH 32.9 26.0 - 34.0 pg   MCHC 33.6 30.0 - 36.0 g/dL   RDW 13.1 11.5 - 15.5 %   Platelets 257 150 - 400 K/uL   nRBC 0.0 0.0 - 0.2 %   Neutrophils Relative % 61 %   Neutro Abs 2.7 1.7 - 7.7 K/uL   Lymphocytes Relative 24 %   Lymphs Abs 1.1 0.7 - 4.0 K/uL   Monocytes Relative  11 %   Monocytes  Absolute 0.5 0.1 - 1.0 K/uL   Eosinophils Relative 2 %   Eosinophils Absolute 0.1 0.0 - 0.5 K/uL   Basophils Relative 1 %   Basophils Absolute 0.0 0.0 - 0.1 K/uL   Immature Granulocytes 1 %   Abs Immature Granulocytes 0.02 0.00 - 0.07 K/uL    Comment: Performed at [facility] [address], [place], [place] [address]  Glucose, capillary     Status: None   Collection Time: 08/03/21  3:53 AM  Result Value Ref Range   Glucose-Capillary 98 70 - 99 mg/dL    Comment: Glucose reference range applies only to samples taken after fasting for at least 8 hours.  Glucose, capillary     Status: None   Collection Time: 08/03/21  7:44 AM  Result Value Ref Range   Glucose-Capillary 97 70 - 99 mg/dL    Comment: Glucose reference range applies only to samples taken after fasting for at least 8 hours.  Glucose, capillary     Status: Abnormal   Collection Time: 08/03/21 11:51 AM  Result Value Ref Range   Glucose-Capillary 124 (H) 70 - 99 mg/dL    Comment: Glucose reference range applies only to samples taken after fasting for at least 8 hours.  Glucose, capillary     Status: Abnormal   Collection Time: 08/03/21  3:38 PM  Result Value Ref Range   Glucose-Capillary 107 (H) 70 - 99 mg/dL    Comment: Glucose reference range applies only to samples taken after fasting for at least 8 hours.  Glucose, capillary     Status: Abnormal   Collection Time: 08/03/21  9:11 PM  Result Value Ref Range   Glucose-Capillary 102 (H) 70 - 99 mg/dL    Comment: Glucose reference range applies only to samples taken after fasting for at least 8 hours.  Glucose, capillary     Status: None   Collection Time: 08/04/21 12:53 AM  Result Value Ref Range   Glucose-Capillary 93 70 - 99 mg/dL    Comment: Glucose reference range applies only to samples taken after fasting for at least 8 hours.  Procalcitonin     Status: None   Collection Time: 08/04/21  1:55 AM  Result Value Ref Range   Procalcitonin <0.10 ng/mL     Comment:        Interpretation: PCT (Procalcitonin) <= 0.5 ng/mL: Systemic infection (sepsis) is not likely. Local bacterial infection is possible. (NOTE)       Sepsis PCT Algorithm           Lower Respiratory Tract                                      Infection PCT Algorithm    ----------------------------     ----------------------------         PCT < 0.25 ng/mL                PCT < 0.10 ng/mL          Strongly encourage             Strongly discourage   discontinuation of antibiotics    initiation of antibiotics    ----------------------------     -----------------------------       PCT 0.25 - 0.50 ng/mL            PCT 0.10 - 0.25 ng/mL  OR       >80% decrease in PCT            Discourage initiation of                                            antibiotics      Encourage discontinuation           of antibiotics    ----------------------------     -----------------------------         PCT >= 0.50 ng/mL              PCT 0.26 - 0.50 ng/mL               AND        <80% decrease in PCT             Encourage initiation of                                             antibiotics       Encourage continuation           of antibiotics    ----------------------------     -----------------------------        PCT >= 0.50 ng/mL                  PCT > 0.50 ng/mL               AND         increase in PCT                  Strongly encourage                                      initiation of antibiotics    Strongly encourage escalation           of antibiotics                                     -----------------------------                                           PCT <= 0.25 ng/mL                                                 OR                                        > 80% decrease in PCT                                      Discontinue / Do not initiate  antibiotics  Performed at [facility], [place] [address]  [place], [place] [address]   Magnesium     Status: None   Collection Time: 08/04/21  1:55 AM  Result Value Ref Range   Magnesium 1.8 1.7 - 2.4 mg/dL    Comment: Performed at [facility] [address], [place], [place] [address]  C-reactive protein     Status: None   Collection Time: 08/04/21  1:55 AM  Result Value Ref Range   CRP 0.8 <1.0 mg/dL    Comment: Performed at [facility] [address], [place], [place] [address]  Comprehensive metabolic panel     Status: Abnormal   Collection Time: 08/04/21  1:55 AM  Result Value Ref Range   Sodium 142 135 - 145 mmol/L   Potassium 3.4 (L) 3.5 - 5.1 mmol/L   Chloride 107 98 - 111 mmol/L   CO2 25 22 - 32 mmol/L   Glucose, Bld 105 (H) 70 - 99 mg/dL    Comment: Glucose reference range applies only to samples taken after fasting for at least 8 hours.   BUN <5 (L) 8 - 23 mg/dL   Creatinine, Ser 0.93 0.61 - 1.24 mg/dL   Calcium 8.9 8.9 - 10.3 mg/dL   Total Protein 5.9 (L) 6.5 - 8.1 g/dL   Albumin 2.9 (L) 3.5 - 5.0 g/dL   AST 12 (L) 15 - 41 U/L   ALT 9 0 - 44 U/L   Alkaline Phosphatase 84 38 - 126 U/L   Total Bilirubin 1.0 0.3 - 1.2 mg/dL   GFR, Estimated >60 >60 mL/min    Comment: (NOTE) Calculated using the CKD-EPI Creatinine Equation (2021)    Anion gap 10 5 - 15    Comment: Performed at [facility], [place] [address] [place], [place] [address]  Brain natriuretic peptide     Status: Abnormal   Collection Time: 08/04/21  1:55 AM  Result Value Ref Range   B Natriuretic Peptide 104.8 (H) 0.0 - 100.0 pg/mL    Comment: Performed at [facility] [address], [place], [place] [address]  CBC with Differential/Platelet     Status: Abnormal   Collection Time: 08/04/21  1:55 AM  Result Value Ref Range   WBC 4.3 4.0 - 10.5 K/uL   RBC 3.26 (L) 4.22 - 5.81 MIL/uL   Hemoglobin 10.4 (L) 13.0 - 17.0 g/dL   HCT 31.6 (L) 39.0 - 52.0 %   MCV 96.9 80.0 - 100.0 fL   MCH 31.9 26.0 - 34.0 pg   MCHC 32.9 30.0 - 36.0 g/dL    RDW 13.2 11.5 - 15.5 %   Platelets 239 150 - 400 K/uL   nRBC 0.0 0.0 - 0.2 %   Neutrophils Relative % 65 %   Neutro Abs 2.8 1.7 - 7.7 K/uL   Lymphocytes Relative 20 %   Lymphs Abs 0.9 0.7 - 4.0 K/uL   Monocytes Relative 12 %   Monocytes Absolute 0.5 0.1 - 1.0 K/uL   Eosinophils Relative 2 %   Eosinophils Absolute 0.1 0.0 - 0.5 K/uL   Basophils Relative 1 %   Basophils Absolute 0.0 0.0 - 0.1 K/uL   Immature Granulocytes 0 %   Abs Immature Granulocytes 0.01 0.00 - 0.07 K/uL    Comment: Performed at [facility] [address] [place], [place] [address]  Glucose, capillary     Status: None   Collection Time: 08/04/21  4:28 AM  Result Value Ref Range   Glucose-Capillary 98 70 - 99 mg/dL    Comment: Glucose reference range applies only to samples taken after fasting for at least 8 hours.  Glucose, capillary     Status: Abnormal   Collection Time: 08/04/21  7:53 AM  Result Value Ref Range   Glucose-Capillary 109 (H) 70 - 99 mg/dL    Comment: Glucose reference range applies only to samples taken after fasting for at least 8 hours.  Glucose, capillary     Status: Abnormal   Collection Time: 08/04/21 11:37 AM  Result Value Ref Range   Glucose-Capillary 146 (H) 70 - 99 mg/dL    Comment: Glucose reference range applies only to samples taken after fasting for at least 8 hours.  Glucose, capillary     Status: Abnormal   Collection Time: 08/04/21  3:55 PM  Result Value Ref Range   Glucose-Capillary 146 (H) 70 - 99 mg/dL    Comment: Glucose reference range applies only to samples taken after fasting for at least 8 hours.  Glucose, capillary     Status: Abnormal   Collection Time: 08/04/21  9:38 PM  Result Value Ref Range   Glucose-Capillary 131 (H) 70 - 99 mg/dL    Comment: Glucose reference range applies only to samples taken after fasting for at least 8 hours.  Magnesium     Status: None   Collection Time: 08/05/21  1:19 AM  Result Value Ref Range   Magnesium 1.8 1.7 - 2.4  mg/dL    Comment: Performed at [facility] [address], [place], [place] [address]  C-reactive protein     Status: None   Collection Time: 08/05/21  1:19 AM  Result Value Ref Range   CRP 0.9 <1.0 mg/dL    Comment: Performed at [facility] [address] [place], [place] [address]  Comprehensive metabolic panel     Status: Abnormal   Collection Time: 08/05/21  1:19 AM  Result Value Ref Range   Sodium 140 135 - 145 mmol/L   Potassium 3.3 (L) 3.5 - 5.1 mmol/L   Chloride 105 98 - 111 mmol/L   CO2 26 22 - 32 mmol/L   Glucose, Bld 118 (H) 70 - 99 mg/dL    Comment: Glucose reference range applies only to samples taken after fasting for at least 8 hours.   BUN 5 (L) 8 - 23 mg/dL   Creatinine, Ser 1.11 0.61 - 1.24 mg/dL   Calcium 8.6 (L) 8.9 - 10.3 mg/dL   Total Protein 6.2 (L) 6.5 - 8.1 g/dL   Albumin 3.0 (L) 3.5 - 5.0 g/dL   AST 14 (L) 15 - 41 U/L   ALT 10 0 - 44 U/L   Alkaline Phosphatase 92 38 - 126 U/L   Total Bilirubin 0.3 0.3 - 1.2 mg/dL   GFR, Estimated >60 >60 mL/min    Comment: (NOTE) Calculated using the CKD-EPI Creatinine Equation (2021)    Anion gap 9 5 - 15    Comment: Performed at [facility], [place] [address] [place], [place] [address]  Brain natriuretic peptide     Status: Abnormal   Collection Time: 08/05/21  1:19 AM  Result Value Ref Range   B Natriuretic Peptide 133.4 (H) 0.0 - 100.0 pg/mL    Comment: Performed at [facility] [address], [place], [place] [address]  CBC with Differential/Platelet     Status: Abnormal   Collection  Time: 08/05/21  1:19 AM  Result Value Ref Range   WBC 4.6 4.0 - 10.5 K/uL   RBC 3.30 (L) 4.22 - 5.81 MIL/uL   Hemoglobin 10.6 (L) 13.0 - 17.0 g/dL   HCT 32.5 (L) 39.0 - 52.0 %   MCV 98.5 80.0 - 100.0 fL   MCH 32.1 26.0 - 34.0 pg   MCHC 32.6 30.0 - 36.0 g/dL   RDW 13.2 11.5 - 15.5 %   Platelets 250 150 - 400 K/uL   nRBC 0.0 0.0 - 0.2 %   Neutrophils Relative % 61 %   Neutro Abs 2.8 1.7 - 7.7  K/uL   Lymphocytes Relative 25 %   Lymphs Abs 1.1 0.7 - 4.0 K/uL   Monocytes Relative 11 %   Monocytes Absolute 0.5 0.1 - 1.0 K/uL   Eosinophils Relative 2 %   Eosinophils Absolute 0.1 0.0 - 0.5 K/uL   Basophils Relative 1 %   Basophils Absolute 0.0 0.0 - 0.1 K/uL   Immature Granulocytes 0 %   Abs Immature Granulocytes 0.01 0.00 - 0.07 K/uL    Comment: Performed at [facility], [address], [place], [place] [address]  Glucose, capillary     Status: Abnormal   Collection Time: 08/05/21  2:17 AM  Result Value Ref Range   Glucose-Capillary 117 (H) 70 - 99 mg/dL    Comment: Glucose reference range applies only to samples taken after fasting for at least 8 hours.  Glucose, capillary     Status: Abnormal   Collection Time: 08/05/21  3:57 AM  Result Value Ref Range   Glucose-Capillary 116 (H) 70 - 99 mg/dL    Comment: Glucose reference range applies only to samples taken after fasting for at least 8 hours.  Glucose, capillary     Status: Abnormal   Collection Time: 08/05/21  7:49 AM  Result Value Ref Range   Glucose-Capillary 110 (H) 70 - 99 mg/dL    Comment: Glucose reference range applies only to samples taken after fasting for at least 8 hours.  Glucose, capillary     Status: Abnormal   Collection Time: 08/05/21 11:50 AM  Result Value Ref Range   Glucose-Capillary 133 (H) 70 - 99 mg/dL    Comment: Glucose reference range applies only to samples taken after fasting for at least 8 hours.  Urinalysis, Routine w reflex microscopic     Status: Abnormal   Collection Time: 08/07/21  2:59 PM  Result Value Ref Range   Specific Gravity, UA 1.015 1.005 - 1.030   pH, UA 6.5 5.0 - 7.5   Color, UA Amber (A) Yellow   Appearance Ur Clear Clear   Leukocytes,UA Negative Negative   Protein,UA 1+ (A) Negative/Trace   Glucose, UA Negative Negative   Ketones, UA Negative Negative   RBC, UA Negative Negative   Bilirubin, UA Negative Negative   Urobilinogen, Ur 0.2 0.2 - 1.0 mg/dL    Nitrite, UA Negative Negative   Microscopic Examination See below:   Microscopic Examination     Status: None   Collection Time: 08/07/21  2:59 PM   Urine  Result Value Ref Range   WBC, UA None seen 0 - 5 /hpf   RBC None seen 0 - 2 /hpf   Epithelial Cells (non renal) None seen 0 - 10 /hpf   Renal Epithel, UA None seen None seen /hpf   Mucus, UA Present Not Estab.   Bacteria, UA None seen None seen/Few  BMET Recent Labs    08/05/21 0119  NA 140  K 3.3*  CL 105  CO2 26  GLUCOSE 118*  BUN 5*  CREATININE 1.11  CALCIUM 8.6*   PT/INR No results for input(s): LABPROT, INR in the last 72 hours. ABG No results for input(s): PHART, HCO3 in the last 72 hours.  Invalid input(s): PCO2, PO2  Studies/Results: No results found. PVR 54ml  CT ABDOMEN PELVIS WO CONTRAST  Result Date: 08/02/2021 CLINICAL DATA:  Abdominal pain and vomiting EXAM: CT ABDOMEN AND PELVIS WITHOUT CONTRAST TECHNIQUE: Multidetector CT imaging of the abdomen and pelvis was performed following the standard protocol without IV contrast. RADIATION DOSE REDUCTION: This exam was performed according to the departmental dose-optimization program which includes automated exposure control, adjustment of the mA and/or kV according to patient size and/or use of iterative reconstruction technique. COMPARISON:  CT chest angiogram, 07/30/2021 FINDINGS: Lower chest: Trace left pleural effusion. Multiple pulmonary nodules in the left lung base measuring up to 1.7 cm, as seen on prior dedicated examination of the chest (series 5, image 12). Small hiatal hernia. Hepatobiliary: No solid liver abnormality is seen. No gallstones, gallbladder wall thickening, or biliary dilatation. Pancreas: Unremarkable. No pancreatic ductal dilatation or surrounding inflammatory changes. Spleen: Normal in size without significant abnormality. Adrenals/Urinary Tract: Adrenal glands are unremarkable. Kidneys are normal, without renal calculi, solid  lesion, or hydronephrosis. Bladder is unremarkable. Stomach/Bowel: Stomach is within normal limits. Appendix appears normal. No evidence of bowel wall thickening, distention, or inflammatory changes. Descending and sigmoid diverticulosis. Vascular/Lymphatic: Aortic atherosclerosis. No enlarged abdominal or pelvic lymph nodes. Reproductive: Prostatomegaly and median lobe hypertrophy. Other: No abdominal wall hernia or abnormality. No ascites. Musculoskeletal: No acute or significant osseous findings. IMPRESSION: 1. No acute noncontrast CT findings of the abdomen or pelvis to explain pain. 2. Descending and sigmoid diverticulosis without evidence of acute diverticulitis. 3. Prostatomegaly. 4. Multiple pulmonary nodules in the left lung base measuring up to 1.7 cm, as seen on prior dedicated examination of the chest. As previously reported, these are nonspecific, potentially infectious or inflammatory, however malignancy is not excluded. Aortic Atherosclerosis (ICD10-I70.0). Electronically Signed   By: Delanna Ahmadi M.D.   On: 08/02/2021 15:29   DG Chest 2 View  Result Date: 08/01/2021 CLINICAL DATA:  Shortness of breath. EXAM: CHEST - 2 VIEW COMPARISON:  CT a chest dated July 30, 2021. Chest x-ray dated August 30, 2017. FINDINGS: Unchanged mild cardiomegaly. Normal pulmonary vascularity. Subtle patchy opacities in the left lower lobe, better evaluated on recent chest CT. No pleural effusion or pneumothorax. No acute osseous abnormality. IMPRESSION: 1. Left lower lobe pneumonia, better evaluated on recent chest CT. Electronically Signed   By: Titus Dubin M.D.   On: 08/01/2021 14:50   DG Cervical Spine 2 or 3 views  Result Date: 06/03/2021 CLINICAL DATA:  C3-4 ACDF EXAM: CERVICAL SPINE - 2-3 VIEW COMPARISON:  03/07/2021 FINDINGS: 2 fluoroscopic images are obtained during the performance of the procedure and are provided for interpretation only. Images demonstrate ACDF at the C3-4 level with anatomic  alignment. Stable spondylosis at C2-3 and C4-5. Endotracheal tube is identified. FLUOROSCOPY TIME:  18 seconds IMPRESSION: 1. Intraoperative evaluation from C3-4 ACDF. Electronically Signed   By: Randa Ngo M.D.   On: 06/03/2021 15:30   CT Angio Chest PE W and/or Wo Contrast  Result Date: 07/30/2021 CLINICAL DATA:  Chest pain EXAM: CT ANGIOGRAPHY CHEST WITH CONTRAST TECHNIQUE: Multidetector CT imaging of the chest was performed using the standard protocol during  bolus administration of intravenous contrast. Multiplanar CT image reconstructions and MIPs were obtained to evaluate the vascular anatomy. RADIATION DOSE REDUCTION: This exam was performed according to the departmental dose-optimization program which includes automated exposure control, adjustment of the mA and/or kV according to patient size and/or use of iterative reconstruction technique. CONTRAST:  55mL OMNIPAQUE IOHEXOL 350 MG/ML SOLN COMPARISON:  08/29/2017 FINDINGS: Cardiovascular: Heart is enlarged in size. Scattered coronary artery calcifications are seen. Contrast density in the thoracic aorta is less than optimal. As far as seen, there is no definite demonstrable intimal flap. 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 There are no intraluminal filling defects in the pulmonary artery branches. Mediastinum/Nodes: No new significant lymphadenopathy seen. Lungs/Pleura: There are multiple new nodular densities in the left lower lobe largest measuring 2.5 cm in size. Many of these nodular densities are in the periphery of left lower lobe. Rest of the lung fields are clear. There is no significant pleural effusion or pneumothorax. Upper Abdomen: There is 7 mm calculus in the upper pole of right kidney. Small hiatal hernia is seen. Musculoskeletal: Degenerative changes are noted in the thoracic spine and visualized lower cervical spine. Review of the MIP images confirms the above findings. IMPRESSION: There is no evidence of  pulmonary artery embolism. There are multiple new patchy nodular densities in the left lower lobe measuring up to 2.5 cm in size suggesting pneumonia. Possibility of 1 of these nodules being neoplastic process is not excluded. Short-term follow-up CT in 2-3 weeks after medical treatment for pneumonia should be considered. 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 There is 7 mm right renal stone.  Small hiatal hernia. Electronically Signed   By: Elmer Picker M.D.   On: 07/30/2021 16:39   CARDIAC CATHETERIZATION  Result Date: 05/23/2021   Prox RCA lesion is 20% stenosed.   Prox Cx lesion is 30% stenosed.   Prox LAD to Mid LAD lesion is 30% stenosed.   Dist LAD-1 lesion is 50% stenosed.   Dist LAD-2 lesion is 70% stenosed.   The left ventricular ejection fraction is greater than 65% by visual estimate. Findings: Ao  = 132/81 (99) LV = 147/10 RA = 2 RV = 26/6 PA = 24/1 (14) PCW = 6 Fick cardiac output/index = 8.0/3.5 PVR = 1.0 WU Ao sat = 99% PA sat = 72%, 73% SVC sat = 76% Assessment: 1. Mild to moderate non-obstructive CAD 2. EF 65-70% 3. Normal filling pressures 4. High cardiac output with no evidence of intracardiac shunting Plan/Discussion: Will need aggressive risk factor management. He can proceed with surgery without further cardiac work-up. Felt to be low-risk for peri-op CV complications. Glori Bickers, MD 8:51 AM  US Carotid Bilateral  Result Date: 05/15/2021 CLINICAL DATA:  66 year old male with history of syncope. EXAM: BILATERAL CAROTID DUPLEX ULTRASOUND TECHNIQUE: Pearline Cables scale imaging, color Doppler and duplex ultrasound were performed of bilateral carotid and vertebral arteries in the neck. COMPARISON:  None. FINDINGS: Criteria: Quantification of carotid stenosis is based on velocity parameters that correlate the residual internal carotid diameter with NASCET-based stenosis levels, using the diameter of the distal internal carotid lumen as the denominator  for stenosis measurement. The following velocity measurements were obtained: RIGHT ICA: Peak systolic velocity 62 cm/sec, End diastolic velocity 20 cm/sec CCA: Peak systolic velocity 80 cm/sec SYSTOLIC ICA/CCA RATIO:  0.8 ECA: Peak systolic velocity 64 cm/sec LEFT ICA: Peak systolic velocity 68 cm/sec, End diastolic velocity 19 cm/sec CCA: 80  cm/sec SYSTOLIC ICA/CCA RATIO:  0.9 ECA: 58 cm/sec RIGHT CAROTID ARTERY: No atherosclerotic plaque formation. No significant tortuosity. Normal low resistance waveforms. RIGHT VERTEBRAL ARTERY:  Antegrade flow. LEFT CAROTID ARTERY: No atherosclerotic plaque formation. No significant tortuosity. Normal low resistance waveforms. LEFT VERTEBRAL ARTERY:  Not definitively visualized. Upper extremity non-invasive blood pressures: IMPRESSION: 1. Right carotid artery system: Patent without significant atherosclerotic plaque formation. 2. Left carotid artery system: Patent without significant atherosclerotic plaque formation. 3. Vertebral artery system: The left vertebral artery is not visualized. The right vertebral artery is patent with antegrade flow. Ruthann Cancer, MD [facility] Electronically Signed   By: Ruthann Cancer M.D.   On: 05/15/2021 11:13   US Venous Img Lower Right (DVT Study)  Result Date: 05/15/2021 CLINICAL DATA:  Right leg swelling EXAM: Right LOWER EXTREMITY VENOUS DOPPLER ULTRASOUND TECHNIQUE: Gray-scale sonography with compression, as well as color and duplex ultrasound, were performed to evaluate the deep venous system(s) from the level of the common femoral vein through the popliteal and proximal calf veins. COMPARISON:  None. FINDINGS: VENOUS Normal compressibility of the common femoral, superficial femoral, and popliteal veins, as well as the visualized calf veins. Visualized portions of profunda femoral vein and great saphenous vein unremarkable. No filling defects to suggest DVT on grayscale or  color Doppler imaging. Doppler waveforms show normal direction of venous flow, normal respiratory plasticity and response to augmentation. Limited views of the contralateral common femoral vein are unremarkable. OTHER None. Limitations: none IMPRESSION: Negative. Electronically Signed   By: Ofilia Neas M.D.   On: 05/15/2021 11:00   DG Abd Portable 1V  Result Date: 08/02/2021 CLINICAL DATA:  66 year old male with nausea and vomiting. EXAM: PORTABLE ABDOMEN - 1 VIEW COMPARISON:  Chest radiographs yesterday. FINDINGS: Portable AP supine views at 0650 hours. Non obstructed bowel gas pattern. No pneumoperitoneum evident on these supine views. Streaky left lung base opacity persists. Abdominal and pelvic visceral contours appear normal. Chronic lower lumbar disc and endplate degeneration. No acute osseous abnormality identified. IMPRESSION: Normal bowel gas pattern. Electronically Signed   By: Genevie Ann M.D.   On: 08/02/2021 07:31   DG C-Arm 1-60 Min-No Report  Result Date: 06/03/2021 Fluoroscopy was utilized by the requesting physician.  No radiographic interpretation.   DG C-Arm 1-60 Min-No Report  Result Date: 06/03/2021 Fluoroscopy was utilized by the requesting physician.  No radiographic interpretation.   ECHOCARDIOGRAM COMPLETE  Result Date: 05/15/2021    ECHOCARDIOGRAM REPORT   Patient Name:   WORTH KOBER Date of Exam: 05/15/2021 Medical Rec #:  [id number]          Height:       69.0 in Accession #:    [id number]         Weight:       254.0 lb Date of Birth:  1956-04-22          BSA:          2.287 m Patient Age:    22 years           BP:           150/70 mmHg Patient Gender: M                  HR:           69 bpm. Exam Location:  [facility] Procedure: 2D Echo, Cardiac Doppler and Color Doppler Indications:    Syncope  History:  Patient has prior history of Echocardiogram examinations, most                 recent 11/13/2013. Risk Factors:Hypertension and Dyslipidemia.  Sonographer:     Wenda Low Referring Phys: [id number] OLADAPO ADEFESO  Sonographer Comments: Patient is morbidly obese. IMPRESSIONS  1. Left ventricular ejection fraction, by estimation, is 65 to 70%. The left ventricle has normal function. The left ventricle has no regional wall motion abnormalities. There is severe asymmetric left ventricular hypertrophy of the septal segment. Left  ventricular diastolic parameters are consistent with Grade II diastolic dysfunction (pseudonormalization). Elevated left ventricular end-diastolic pressure.  2. Right ventricular systolic function is normal. The right ventricular size is normal. There is normal pulmonary artery systolic pressure. The estimated right ventricular systolic pressure is 32.2 mmHg.  3. There is a trivial pericardial effusion posterior to the left ventricle.  4. The mitral valve is grossly normal. Mild mitral valve regurgitation.  5. The aortic valve is tricuspid. Aortic valve regurgitation is not visualized. No aortic stenosis is present. Aortic valve mean gradient measures 6.0 mmHg.  6. The inferior vena cava is normal in size with greater than 50% respiratory variability, suggesting right atrial pressure of 3 mmHg. Comparison(s): No prior Echocardiogram. FINDINGS  Left Ventricle: Left ventricular ejection fraction, by estimation, is 65 to 70%. The left ventricle has normal function. The left ventricle has no regional wall motion abnormalities. The left ventricular internal cavity size was normal in size. There is  severe asymmetric left ventricular hypertrophy of the septal segment. Left ventricular diastolic parameters are consistent with Grade II diastolic dysfunction (pseudonormalization). Elevated left ventricular end-diastolic pressure. Right Ventricle: The right ventricular size is normal. No increase in right ventricular wall thickness. Right ventricular systolic function is normal. There is normal pulmonary artery systolic pressure. The tricuspid regurgitant  velocity is 2.61 m/s, and  with an assumed right atrial pressure of 3 mmHg, the estimated right ventricular systolic pressure is 02.5 mmHg. Left Atrium: Left atrial size was normal in size. Right Atrium: Right atrial size was normal in size. Pericardium: Trivial pericardial effusion is present. The pericardial effusion is posterior to the left ventricle. Mitral Valve: The mitral valve is grossly normal. Mild mitral valve regurgitation. MV peak gradient, 4.4 mmHg. The mean mitral valve gradient is 1.0 mmHg. Tricuspid Valve: The tricuspid valve is grossly normal. Tricuspid valve regurgitation is mild. Aortic Valve: The aortic valve is tricuspid. There is mild to moderate aortic valve annular calcification. Aortic valve regurgitation is not visualized. No aortic stenosis is present. Aortic valve mean gradient measures 6.0 mmHg. Aortic valve peak gradient measures 11.0 mmHg. Aortic valve area, by VTI measures 2.83 cm. Pulmonic Valve: The pulmonic valve was grossly normal. Pulmonic valve regurgitation is trivial. Aorta: The aortic root is normal in size and structure. Venous: The inferior vena cava is normal in size with greater than 50% respiratory variability, suggesting right atrial pressure of 3 mmHg. IAS/Shunts: No atrial level shunt detected by color flow Doppler.  LEFT VENTRICLE PLAX 2D LVIDd:         4.20 cm     Diastology LVIDs:         2.60 cm     LV e' medial:    4.68 cm/s LV PW:         1.90 cm     LV E/e' medial:  17.7 LV IVS:        2.00 cm     LV e' lateral:   5.11 cm/s  LVOT diam:     2.10 cm     LV E/e' lateral: 16.2 LV SV:         113 LV SV Index:   49 LVOT Area:     3.46 cm  LV Volumes (MOD) LV vol d, MOD A2C: 38.6 ml LV vol d, MOD A4C: 80.6 ml LV vol s, MOD A2C: 18.5 ml LV vol s, MOD A4C: 31.8 ml LV SV MOD A2C:     20.1 ml LV SV MOD A4C:     80.6 ml LV SV MOD BP:      35.6 ml RIGHT VENTRICLE RV Basal diam:  3.30 cm RV Mid diam:    3.00 cm RV S prime:     17.60 cm/s TAPSE (M-mode): 2.8 cm LEFT ATRIUM              Index        RIGHT ATRIUM           Index LA diam:        3.20 cm 1.40 cm/m   RA Area:     13.40 cm LA Vol (A2C):   47.7 ml 20.86 ml/m  RA Volume:   29.50 ml  12.90 ml/m LA Vol (A4C):   43.4 ml 18.98 ml/m LA Biplane Vol: 45.7 ml 19.98 ml/m  AORTIC VALVE                     PULMONIC VALVE AV Area (Vmax):    2.78 cm      PV Vmax:       1.05 m/s AV Area (Vmean):   2.72 cm      PV Peak grad:  4.4 mmHg AV Area (VTI):     2.83 cm AV Vmax:           166.00 cm/s AV Vmean:          119.000 cm/s AV VTI:            0.398 m AV Peak Grad:      11.0 mmHg AV Mean Grad:      6.0 mmHg LVOT Vmax:         133.00 cm/s LVOT Vmean:        93.400 cm/s LVOT VTI:          0.325 m LVOT/AV VTI ratio: 0.82  AORTA Ao Root diam: 3.70 cm MITRAL VALVE               TRICUSPID VALVE MV Area (PHT): 3.60 cm    TR Peak grad:   27.2 mmHg MV Area VTI:   3.51 cm    TR Vmax:        261.00 cm/s MV Peak grad:  4.4 mmHg MV Mean grad:  1.0 mmHg    SHUNTS MV Vmax:       1.05 m/s    Systemic VTI:  0.32 m MV Vmean:      48.8 cm/s   Systemic Diam: 2.10 cm MV Decel Time: 211 msec MV E velocity: 82.70 cm/s MV A velocity: 90.00 cm/s MV E/A ratio:  0.92 Rozann Lesches MD Electronically signed by Rozann Lesches MD Signature Date/Time: 05/15/2021/11:44:39 AM    Final    LONG TERM MONITOR-LIVE TELEMETRY (3-14 DAYS)  Result Date: 06/22/2021 Patch Wear Time:  12 days and 13 hours (2022-11-09T16:22:40-0500 to 2022-11-22T06:14:17-0500) 1.  Predominant underlying rhythm was Sinus Rhythm - average 69 bpm 2.  Intermittent Bundle Branch Block was present. 3.  16 runs  of SV occurred, the run with the fastest interval lasting 5 beats with a max rate of 169 bpm, the longest lasting 13 beats with an avg rate of 127 bpm. 4.  PACs and PVCs were rare 5.  Ventricular Bigeminy and Trigeminy were present. 6. No patient-triggered events reported in diary. Glori Bickers, MD 11:47 PM    Assessment/Plan: BPH with BOO and OAB wet.  He is doing well on the  tamsulosin with near resolution of his symptoms and he will continue that.   Elevated PSA.   His PSA is up with a low f/t ratio and I had ordered a biopsy but he has has intervening medical issues which resulted in cancellation of the biopsy.  He had a CT that shows a prostate volume of 175ml.   With that finding and his recent issues, I am just going to repeat a PSA in 3 months and reassess the need for biopsy.   No orders of the defined types were placed in this encounter.      Orders Placed This Encounter  Procedures   Microscopic Examination   Urinalysis, Routine w reflex microscopic   PSA, total and free    Standing Status:   Future    Standing Expiration Date:   02/04/2022     Return in about 3 months (around 11/05/2021) for with PSA.    CC: Dr. [name].      Irine Seal 08/07/2021 [phone] Patient ID: [name], male   DOB: [birth year], 66 y.o.   MRN: [id number] Patient ID: ENRICO EADDY, male   DOB: 1955-12-19, 66 y.o.   MRN: [id number]

## 2021-08-12 DIAGNOSIS — J189 Pneumonia, unspecified organism: Secondary | ICD-10-CM | POA: Diagnosis not present

## 2021-08-12 DIAGNOSIS — I77810 Thoracic aortic ectasia: Secondary | ICD-10-CM | POA: Diagnosis not present

## 2021-08-12 DIAGNOSIS — I509 Heart failure, unspecified: Secondary | ICD-10-CM | POA: Diagnosis not present

## 2021-08-12 DIAGNOSIS — I10 Essential (primary) hypertension: Secondary | ICD-10-CM | POA: Diagnosis not present

## 2021-08-12 NOTE — Progress Notes (Signed)
ADVANCED HF CLINIC NOTE   Primary Care: Dr Karie Kirks  HF Cardiologist: Dr. Haroldine Laws Neuro Surgeon: Dr Marcello Moores   HPI: Andrew Arnold is a 66 y.o. with history of syncope, HTN, DM2, HL and diastolic HF  Followed by neuro urgery for RUE/LUE weakness.. CT with cervical spine stenosis  On 05/15/2021 presented ED with syncope. Standing in front of the refrigerator then woke up on the floor.  EKG SR 64 bpm. ECHO EF 65-70%, LVH, grade II DD, and normal RV. Carotids and lower extremity dopplers normal.  Seen in [facility] with progessive LE edema and SOB. Meds adjusted and referred for cath as part of pre-op clearance. Cath 05/23/21 as below non-obstructive CAD and normal RHC.  He underwent successful cervical decompression on 26/83/41 without complication.   Doing well at follow up 11/22, dilt stopped and Entresto started.  Admitted 1/23 with PNA/sepsis. CT chest/abd/pelvis findings suspicious for PNA vs malignancy. Hospitalization c/b AKI and low BP, Entresto, HCTZ, and clonidine stopped at discharge.  Today he returns for post hospital HF follow up. Overall feeling fine. He is not SOB with walking on flat ground, lifting or going up stairs. Denies palpitations, abnormal bleeding, CP, dizziness, edema, or PND/Orthopnea. Has left thigh numbess. Appetite ok. No fever or chills. He does not check weight at home. Taking all medications. Swelling controlled with compression stockings. BP at home 130-160s/90s today.  Cardiac Studies: - Echo (11/22): EF 65-70%, LVH, grade II DD, and normal RV.   - Cath 05/23/21     Prox RCA lesion is 20% stenosed.   Prox Cx lesion is 30% stenosed.   Prox LAD to Mid LAD lesion is 30% stenosed.   Dist LAD-1 lesion is 50% stenosed.   Dist LAD-2 lesion is 70% stenosed.   The left ventricular ejection fraction is greater than 65% by visual estimate. Ao  = 132/81 (99) LV = 147/10 RA = 2 RV = 26/6 PA = 24/1 (14) PCW = 6 Fick cardiac output/index = 8.0/3.5 PVR =  1.0 WU Ao sat = 99% PA sat = 72%, 73% SVC sat = 76%   Assessment: 1. Mild to moderate non-obstructive CAD 2. EF 65-70% 3. Normal filling pressures 4. High cardiac output with no evidence of intracardiac shunting   Past Medical History:  Diagnosis Date   Allergic rhinitis due to pollen    on allergy shots   Arrhythmia    1980s hospitalized for HTN and ?irregular rhythm but no specific dx but resolved during hospitalization   Diabetes mellitus without complication (HCC)    HTN (hypertension)    Hyperlipidemia    Current Outpatient Medications  Medication Sig Dispense Refill   amLODipine (NORVASC) 10 MG tablet Take 1 tablet (10 mg total) by mouth daily. 30 tablet 0   ascorbic acid (VITAMIN C) 500 MG tablet Take 500 mg by mouth every morning.      aspirin 81 MG chewable tablet Chew 1 tablet (81 mg total) by mouth daily. 30 tablet 0   atorvastatin (LIPITOR) 80 MG tablet Take 80 mg by mouth at bedtime.     B Complex Vitamins (B COMPLEX 100 PO) Take 1 tablet by mouth every morning.      carvedilol (COREG) 6.25 MG tablet Take 1 tablet (6.25 mg total) by mouth 2 (two) times daily with a meal. 60 tablet 0   cetirizine (ZYRTEC) 10 MG tablet Take 10 mg by mouth in the morning.     metFORMIN (GLUCOPHAGE) 1000 MG tablet Take 1,000 mg  by mouth 2 (two) times daily.     pantoprazole (PROTONIX) 40 MG tablet Take 1 tablet (40 mg total) by mouth daily. 30 tablet 0   tamsulosin (FLOMAX) 0.4 MG CAPS capsule Take 1 capsule (0.4 mg total) by mouth daily. 30 capsule 11   No current facility-administered medications for this encounter.   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[place], [place]   Lives in [place] Determinants of Health   Financial Resource Strain: Not on file  Food Insecurity: Not on file  Transportation Needs: Not on file  Physical Activity: Not on file  Stress: Not on file  Social Connections: Not on file  Intimate Partner Violence: Not on file   Family History  Problem Relation Age of Onset   Cancer Mother    Cirrhosis Father        cirrhosis of the liver; alcohol related   Diabetes Sister    Other Other        neice was on allergy vaccine when young   Colon cancer Neg Hx    BP (!) 154/78    Pulse 74    Wt 107.7 kg    SpO2 99%    BMI 35.06 kg/m   Wt Readings from Last 3 Encounters:  08/13/21 107.7 kg  08/07/21 102.1 kg  07/31/21 103.7 kg   PHYSICAL EXAM: General:  NAD. No resp difficulty HEENT: Normal Neck: Supple. No JVD. Carotids 2+ bilat; no bruits. No lymphadenopathy or thryomegaly appreciated. Cor: PMI nondisplaced. Regular rate & rhythm. No rubs, gallops or murmurs. Lungs: Clear Abdomen: Obese, nontender, nondistended. No hepatosplenomegaly. No bruits or masses. Good bowel sounds. Extremities: No cyanosis, clubbing, rash, trace BLE edema, compression hose on Neuro: Alert & oriented x 3, cranial nerves grossly intact. Moves all 4 extremities w/o difficulty. Affect pleasant.  ECG:  NSR 71 bpm (personally reviewed).  ASSESSMENT & PLAN: 1. CAD - Cath 11/22 with moderate non-obstructive CAD  - No s/s angina  - Continue atorva + ASA.  2. Chronic HFpEF/LE edema - Echo 05/15/21 EF 65-70% RV normal - Cath 11/22 normal filling pressures. May have component of venous insufficiency. - NYHA I-II, volume looks good today. - Restart Entresto 24/26 mg bid. - Continue carvedilol 6.25 mg bid. - Consider SGLT2i at next  visit. - Encouraged compression hose. - Consider PYP in future. - BMET/BNP and mag today; repeat BMET in 10-14 days.  3.Syncope  - Had syncopal episode 05/14/21 . Unclear etiology.  - Evaluated in the E.D - Carotid Dopplers negative. Echo EF 65-70%  - Zio 11/22 No significant dysrhythmias.  - No driving x 6 months (~2/13) - No further events.  4. HTN  - BP elevated today. - Restart Entresto as above.  5.  Hyperlipidemia  - Continue statin.   6. DMII -  On metformin - Considr SGLT2i next visit.   7. Lung nodule - Incidental finding on CT. - He has follow up with Pulmonary.  Follow up in 6 weeks with APP (BP check and +/- SGLT2i) and 12 weeks with Dr. Haroldine Laws.  Andrew Bihari, FNP  2:57 PM

## 2021-08-13 ENCOUNTER — Other Ambulatory Visit: Payer: Self-pay

## 2021-08-13 ENCOUNTER — Other Ambulatory Visit (HOSPITAL_COMMUNITY): Payer: Self-pay | Admitting: Family Medicine

## 2021-08-13 ENCOUNTER — Ambulatory Visit (HOSPITAL_COMMUNITY)
Admission: RE | Admit: 2021-08-13 | Discharge: 2021-08-13 | Disposition: A | Discharge status: Home / Self Care | Payer: BC Managed Care – PPO | Source: Ambulatory Visit | Attending: Internal Medicine | Admitting: Internal Medicine

## 2021-08-13 ENCOUNTER — Encounter (HOSPITAL_COMMUNITY): Payer: Self-pay

## 2021-08-13 VITALS — BP 154/78 | HR 74 | Wt 237.4 lb

## 2021-08-13 DIAGNOSIS — I2510 Atherosclerotic heart disease of native coronary artery without angina pectoris: Secondary | ICD-10-CM | POA: Diagnosis not present

## 2021-08-13 DIAGNOSIS — E1100 Type 2 diabetes mellitus with hyperosmolarity without nonketotic hyperglycemic-hyperosmolar coma (NKHHC): Secondary | ICD-10-CM

## 2021-08-13 DIAGNOSIS — I10 Essential (primary) hypertension: Secondary | ICD-10-CM

## 2021-08-13 DIAGNOSIS — Z7984 Long term (current) use of oral hypoglycemic drugs: Secondary | ICD-10-CM | POA: Insufficient documentation

## 2021-08-13 DIAGNOSIS — E119 Type 2 diabetes mellitus without complications: Secondary | ICD-10-CM | POA: Insufficient documentation

## 2021-08-13 DIAGNOSIS — M4802 Spinal stenosis, cervical region: Secondary | ICD-10-CM | POA: Insufficient documentation

## 2021-08-13 DIAGNOSIS — Z79899 Other long term (current) drug therapy: Secondary | ICD-10-CM | POA: Insufficient documentation

## 2021-08-13 DIAGNOSIS — R600 Localized edema: Secondary | ICD-10-CM

## 2021-08-13 DIAGNOSIS — E7849 Other hyperlipidemia: Secondary | ICD-10-CM

## 2021-08-13 DIAGNOSIS — R911 Solitary pulmonary nodule: Secondary | ICD-10-CM | POA: Diagnosis not present

## 2021-08-13 DIAGNOSIS — I110 Hypertensive heart disease with heart failure: Secondary | ICD-10-CM | POA: Diagnosis not present

## 2021-08-13 DIAGNOSIS — R55 Syncope and collapse: Secondary | ICD-10-CM | POA: Insufficient documentation

## 2021-08-13 DIAGNOSIS — E785 Hyperlipidemia, unspecified: Secondary | ICD-10-CM | POA: Diagnosis not present

## 2021-08-13 DIAGNOSIS — I5032 Chronic diastolic (congestive) heart failure: Secondary | ICD-10-CM | POA: Insufficient documentation

## 2021-08-13 LAB — BASIC METABOLIC PANEL
Anion gap: 10 (ref 5–15)
BUN: 9 mg/dL (ref 8–23)
CO2: 26 mmol/L (ref 22–32)
Calcium: 8.8 mg/dL — ABNORMAL LOW (ref 8.9–10.3)
Chloride: 105 mmol/L (ref 98–111)
Creatinine, Ser: 0.87 mg/dL (ref 0.61–1.24)
GFR, Estimated: >60 mL/min (ref >60)
Glucose, Bld: 93 mg/dL (ref 70–99)
Potassium: 3.3 mmol/L — ABNORMAL LOW (ref 3.5–5.1)
Sodium: 141 mmol/L (ref 135–145)

## 2021-08-13 LAB — MAGNESIUM: Magnesium: 1.6 mg/dL — ABNORMAL LOW (ref 1.7–2.4)

## 2021-08-13 LAB — BRAIN NATRIURETIC PEPTIDE: B Natriuretic Peptide: 77.4 pg/mL (ref 0.0–100.0)

## 2021-08-13 MED ORDER — ENTRESTO 24-26 MG PO TABS: 1.0000 | ORAL_TABLET | Freq: Two times a day (BID) | ORAL | 3 refills | Status: DC

## 2021-08-13 NOTE — Patient Instructions (Signed)
Medication Changes:  Restart Entresto 24/26 Twice daily   Lab Work:  Labs done today, your results will be available in MyChart, we will contact you for abnormal readings.   Testing/Procedures:  Repeat labs in 14 days  Referrals:  none  Special Instructions // Education:  Do the following things EVERYDAY: Weigh yourself in the morning before breakfast. Write it down and keep it in a log. Take your medicines as prescribed Eat low salt foods--Limit salt (sodium) to 2000 mg per day.  Stay as active as you can everyday Limit all fluids for the day to less than 2 liters   Follow-Up in: 6 wks in clinic and 12 wks with Dr. Haroldine Laws  At the [facility], you and your health needs are our priority. We have a designated team specialized in the treatment of Heart Failure. This Care Team includes your primary Heart Failure Specialized Cardiologist (physician), Advanced Practice Providers (APPs- Physician Assistants and Nurse Practitioners), and Pharmacist who all work together to provide you with the care you need, when you need it.   You may see any of the following providers on your designated Care Team at your next follow up:  Dr Glori Bickers Dr Haynes Kerns, NP Lyda Jester, [place] [facility], [place] Audry Riles, PharmD   Please be sure to bring in all your medications bottles to every appointment.   Need to Contact us:  If you have any questions or concerns before your next appointment please send [place] a message through [place] or call our office at [phone].    TO LEAVE A MESSAGE FOR THE NURSE SELECT OPTION 2, PLEASE LEAVE A MESSAGE INCLUDING: YOUR NAME DATE OF BIRTH CALL BACK NUMBER REASON FOR CALL**this is important as we prioritize the call backs  YOU WILL RECEIVE A CALL BACK THE SAME DAY AS LONG AS YOU CALL BEFORE 4:00 PM

## 2021-08-14 ENCOUNTER — Telehealth (HOSPITAL_COMMUNITY): Payer: Self-pay

## 2021-08-14 MED ORDER — POTASSIUM CHLORIDE CRYS ER 20 MEQ PO TBCR: 20.0000 meq | EXTENDED_RELEASE_TABLET | Freq: Every day | ORAL | 3 refills | Status: DC

## 2021-08-14 MED ORDER — MAGNESIUM OXIDE -MG SUPPLEMENT 200 MG PO TABS: 200.0000 mg | ORAL_TABLET | Freq: Every day | ORAL | 3 refills | Status: DC

## 2021-08-14 NOTE — Telephone Encounter (Signed)
Patient advised and verbalized understanding. New Rxs sent into patients pharmacy. Patient already has pending lab appt.   Meds ordered this encounter  Medications   Magnesium Oxide 200 MG TABS    Sig: Take 1 tablet (200 mg total) by mouth daily.    Dispense:  90 tablet    Refill:  3   potassium chloride SA (KLOR-CON M20) 20 MEQ tablet    Sig: Take 1 tablet (20 mEq total) by mouth daily.    Dispense:  90 tablet    Refill:  3

## 2021-08-14 NOTE — Telephone Encounter (Signed)
-----   Message from Rafael Bihari,  sent at 08/13/2021  4:42 PM EST ----- K and Mag low. Start 20 mEq of KCL daily and MagOx 200 mg daily.   Repeat BMET and Mag in 10-14 days.

## 2021-08-20 ENCOUNTER — Other Ambulatory Visit: Payer: Self-pay

## 2021-08-20 ENCOUNTER — Ambulatory Visit (INDEPENDENT_AMBULATORY_CARE_PROVIDER_SITE_OTHER): Payer: BC Managed Care – PPO | Admitting: Pulmonary Disease

## 2021-08-20 ENCOUNTER — Encounter: Payer: Self-pay | Admitting: Pulmonary Disease

## 2021-08-20 VITALS — BP 126/74 | HR 83 | Temp 98.3°F | Ht 69.0 in | Wt 231.6 lb

## 2021-08-20 DIAGNOSIS — R918 Other nonspecific abnormal finding of lung field: Secondary | ICD-10-CM

## 2021-08-20 DIAGNOSIS — R911 Solitary pulmonary nodule: Secondary | ICD-10-CM

## 2021-08-20 NOTE — Progress Notes (Signed)
Synopsis: Referred in February 2023 for lung nodule by Thurnell Lose, MD  Subjective:   PATIENT ID: Andrew Arnold GENDER: male DOB: 02/19/1956, MRN: [id number]  Chief Complaint  Patient presents with   Consult    This is a 66 year old gentleman, past medical history of hypertension, hyperlipidemia, type 2 diabetes referred for evaluation of lung nodule by Dr. Candiss Norse.Patient had a CT scan of the chest on presentation to the hospital on 07/30/2021.  The CT scan of the chest revealed multiple patchy nodular densities in the left lower lobe largest of which was 2.5 cm inside suggestive of early pneumonia.  Possibility 1 of these nodular areas being a malignancy was not quite excluded based on CT and they recommended short-term CT scan follow-up.  Patient was referred here today for evaluation and close follow-up of abnormal CT imaging.  From respiratory standpoint patient doing well.  We reviewed his CT imaging today in the office.  Patient's mother had breast cancer, no family history of lung cancer.   Past Medical History:  Diagnosis Date   Allergic rhinitis due to pollen    on allergy shots   Arrhythmia    1980s hospitalized for HTN and ?irregular rhythm but no specific dx but resolved during hospitalization   Diabetes mellitus without complication ([place])    HTN (hypertension)    Hyperlipidemia      Family History  Problem Relation Age of Onset   Cancer Mother    Cirrhosis Father        cirrhosis of the liver; alcohol related   Diabetes Sister    Other Other        neice was on allergy vaccine when young   Colon cancer Neg Hx      Past Surgical History:  Procedure Laterality Date   ANTERIOR CERVICAL DECOMP/DISCECTOMY FUSION N/A 06/03/2021   Procedure: Anterior Cervical Decompression Fusion  Cervical three-four;  Surgeon: Vallarie Mare, MD;  Location: [place];  Service: Neurosurgery;  Laterality: N/A;   colonoscopy  2005   Dr. Gala Romney: normal rectum, sigmoid  diverticula in colonic mucosa   COLONOSCOPY N/A 02/15/2014   Procedure: COLONOSCOPY;  Surgeon: Daneil Dolin, MD;  [facility];  Service: Endoscopy;  Laterality: N/A;  2:00   RIGHT/LEFT HEART CATH AND CORONARY ANGIOGRAPHY N/A 05/23/2021   Procedure: RIGHT/LEFT HEART CATH AND CORONARY ANGIOGRAPHY;  Surgeon: Jolaine Artist, MD;  Location: [place] [facility];  Service: Cardiovascular;  Laterality: N/A;    Social History   Socioeconomic History   Marital status: Single    Spouse name: Not on file   Number of children: Not on file   Years of education: Not on file   Highest education level: Not on file  Occupational History   Occupation: school counselor  Tobacco Use   Smoking status: Never   Smokeless tobacco: Never  Vaping Use   Vaping Use: Never used  Substance and Sexual Activity   Alcohol use: No   Drug use: No   Sexual activity: Yes  Other Topics Concern   Not on file  Social History Narrative   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 30 tablet 0   ascorbic acid (VITAMIN C) 500 MG tablet Take 500 mg by mouth every morning.      aspirin 81 MG chewable tablet Chew 1 tablet (81 mg total) by mouth daily. 30 tablet 0   atorvastatin (LIPITOR) 80 MG tablet Take 80 mg by mouth at bedtime.     B Complex Vitamins (B COMPLEX 100 PO) Take 1 tablet by mouth every morning.      carvedilol (COREG) 6.25 MG tablet Take 1 tablet (6.25 mg total) by mouth 2 (two) times daily with a meal. 60 tablet 0    cetirizine (ZYRTEC) 10 MG tablet Take 10 mg by mouth in the morning.     Magnesium Oxide 200 MG TABS Take 1 tablet (200 mg total) by mouth daily. 90 tablet 3   metFORMIN (GLUCOPHAGE) 1000 MG tablet Take 1,000 mg by mouth 2 (two) times daily.     pantoprazole (PROTONIX) 40 MG tablet Take 1 tablet (40 mg total) by mouth daily. 30 tablet 0   potassium chloride SA (KLOR-CON M20) 20 MEQ tablet Take 1 tablet (20 mEq total) by mouth daily. 90 tablet 3   sacubitril-valsartan (ENTRESTO) 24-26 MG Take 1 tablet by mouth 2 (two) times daily. 180 tablet 3   tamsulosin (FLOMAX) 0.4 MG CAPS capsule Take 1 capsule (0.4 mg total) by mouth daily. 30 capsule 11   No facility-administered medications prior to visit.    Review of Systems  Constitutional:  Negative for chills, fever, malaise/fatigue and weight loss.  HENT:  Negative for hearing loss, sore throat and tinnitus.   Eyes:  Negative for blurred vision and double vision.  Respiratory:  Positive for shortness of breath. Negative for cough, hemoptysis, sputum production, wheezing and stridor.   Cardiovascular:  Negative for chest pain, palpitations, orthopnea, leg swelling and PND.  Gastrointestinal:  Negative for abdominal pain, constipation, diarrhea, heartburn, nausea and vomiting.  Genitourinary:  Negative for dysuria, hematuria and urgency.  Musculoskeletal:  Negative for joint pain and myalgias.  Skin:  Negative for itching and rash.  Neurological:  Negative for dizziness, tingling, weakness and headaches.  Endo/Heme/Allergies:  Negative for environmental allergies. Does not bruise/bleed easily.  Psychiatric/Behavioral:  Negative for depression. The patient is not nervous/anxious and does not have insomnia.   All other systems reviewed and are negative.   Objective:  Physical Exam Vitals reviewed.  Constitutional:      General: He is not in acute distress.    Appearance: He is well-developed. He is obese.  HENT:     Head: Normocephalic and  atraumatic.  Eyes:     General: No scleral icterus.    Conjunctiva/sclera: Conjunctivae normal.     Pupils: Pupils are equal, round, and reactive to light.  Neck:     Vascular: No JVD.     Trachea: No tracheal deviation.  Cardiovascular:     Rate and Rhythm: Normal rate and regular rhythm.     Heart sounds: Normal heart sounds. No murmur heard. Pulmonary:     Effort: Pulmonary effort is normal. No tachypnea, accessory muscle usage or respiratory distress.     Breath sounds: No stridor. No wheezing, rhonchi or rales.  Abdominal:     General: There is no distension.     Palpations:  Abdomen is soft.     Tenderness: There is no abdominal tenderness.  Musculoskeletal:        General: No tenderness.     Cervical back: Neck supple.  Lymphadenopathy:     Cervical: No cervical adenopathy.  Skin:    General: Skin is warm and dry.     Capillary Refill: Capillary refill takes less than 2 seconds.     Findings: No rash.  Neurological:     Mental Status: He is alert and oriented to person, place, and time.  Psychiatric:        Behavior: Behavior normal.     Vitals:   08/20/21 1050  BP: 126/74  Pulse: 83  Temp: 98.3 F (36.8 C)  TempSrc: Oral  SpO2: 100%  Weight: 231 lb 9.6 oz (105.1 kg)  Height: 5\' 9"  (1.753 m)   100% on RA BMI Readings from Last 3 Encounters:  08/20/21 34.20 kg/m  08/13/21 35.06 kg/m  08/07/21 33.23 kg/m   Wt Readings from Last 3 Encounters:  08/20/21 231 lb 9.6 oz (105.1 kg)  08/13/21 237 lb 6.4 oz (107.7 kg)  08/07/21 225 lb (102.1 kg)     CBC    Component Value Date/Time   WBC 4.6 08/05/2021 0119   RBC 3.30 (L) 08/05/2021 0119   HGB 10.6 (L) 08/05/2021 0119   HCT 32.5 (L) 08/05/2021 0119   PLT 250 08/05/2021 0119   MCV 98.5 08/05/2021 0119   MCH 32.1 08/05/2021 0119   MCHC 32.6 08/05/2021 0119   RDW 13.2 08/05/2021 0119   LYMPHSABS 1.1 08/05/2021 0119   MONOABS 0.5 08/05/2021 0119   EOSABS 0.1 08/05/2021 0119   BASOSABS 0.0 08/05/2021  0119    Chest Imaging: 07/30/2021 CTA chest: No PE Left lower lobe pulmonary nodule, largest rounded lesion at 1.7 cm.  Other inflammatory type infiltrates present as well.  Smaller nodule in the subpleural region. The patient's images have been independently reviewed by me.    Pulmonary Functions Testing Results: No flowsheet data found.  FeNO:   Pathology:   Echocardiogram:   Heart Catheterization:     Assessment & Plan:     ICD-10-CM   1. Lung nodule  R91.1 CT Super D Chest Wo Contrast    2. Abnormal CT scan, lung  R91.8       Discussion:  This is a 66 year old gentleman seen today for abnormal CT lung.  He does have a history of heart failure on Entresto.  CT scan of the chest completed on admission to the hospital shows a 1.7 cm rounded nodule there is other inflammatory type infiltrates associated with this.  I am unsure if were dealing with a concern for malignancy or not.  Patient is a lifelong non-smoker.  No family history of lung cancer.  No personal history of extrathoracic malignancy.  Plan: I think his overall chance of malignancy with this type of lesion based on the [facility] solitary pulmonary nodule risk calculator is approximately 11.5%. I do think he needs close follow-up for this nodule. Repeat noncontrasted CT scan of the chest has been ordered for the first week of March which will be approximately 6 weeks since his initial CT. After the CT is complete we will follow-up and determine if he needs to consider pet imaging or consideration for biopsy. If the lesion is still present I think we could move ahead with robotic assisted navigational bronchoscopy for tissue sampling. Patient is agreeable to this plan if the lesion persists.  May be beneficial to get a PET scan if the lesion persists as well.     Current Outpatient Medications:    amLODipine (NORVASC) 10 MG tablet, Take 1 tablet (10 mg total) by mouth daily., Disp: 30 tablet, Rfl: 0    ascorbic acid (VITAMIN C) 500 MG tablet, Take 500 mg by mouth every morning. , Disp: , Rfl:    aspirin 81 MG chewable tablet, Chew 1 tablet (81 mg total) by mouth daily., Disp: 30 tablet, Rfl: 0   atorvastatin (LIPITOR) 80 MG tablet, Take 80 mg by mouth at bedtime., Disp: , Rfl:    B Complex Vitamins (B COMPLEX 100 PO), Take 1 tablet by mouth every morning. , Disp: , Rfl:    carvedilol (COREG) 6.25 MG tablet, Take 1 tablet (6.25 mg total) by mouth 2 (two) times daily with a meal., Disp: 60 tablet, Rfl: 0   cetirizine (ZYRTEC) 10 MG tablet, Take 10 mg by mouth in the morning., Disp: , Rfl:    Magnesium Oxide 200 MG TABS, Take 1 tablet (200 mg total) by mouth daily., Disp: 90 tablet, Rfl: 3   metFORMIN (GLUCOPHAGE) 1000 MG tablet, Take 1,000 mg by mouth 2 (two) times daily., Disp: , Rfl:    pantoprazole (PROTONIX) 40 MG tablet, Take 1 tablet (40 mg total) by mouth daily., Disp: 30 tablet, Rfl: 0   potassium chloride SA (KLOR-CON M20) 20 MEQ tablet, Take 1 tablet (20 mEq total) by mouth daily., Disp: 90 tablet, Rfl: 3   sacubitril-valsartan (ENTRESTO) 24-26 MG, Take 1 tablet by mouth 2 (two) times daily., Disp: 180 tablet, Rfl: 3   tamsulosin (FLOMAX) 0.4 MG CAPS capsule, Take 1 capsule (0.4 mg total) by mouth daily., Disp: 30 capsule, Rfl: 11  I spent 62 minutes dedicated to the care of this patient on the date of this encounter to include pre-visit review of records, face-to-face time with the patient discussing conditions above, post visit ordering of testing, clinical documentation with the electronic health record, making appropriate referrals as documented, and communicating necessary findings to members of the patients care team.   Garner Nash, DO [facility] 08/20/2021 10:54 AM

## 2021-08-20 NOTE — Patient Instructions (Signed)
Thank you for visiting Dr. Valeta Harms at [facility]. Today we recommend the following:  Orders Placed This Encounter  Procedures   CT Super D Chest Wo Contrast   Repeat CT first week of march.   Return in about 4 weeks (around 09/17/2021) for with Eric Form, NP, or Dr. Valeta Harms.    Please do your part to reduce the spread of COVID-19.

## 2021-08-21 ENCOUNTER — Ambulatory Visit (HOSPITAL_COMMUNITY): Payer: BC Managed Care – PPO | Attending: Internal Medicine | Admitting: Physical Therapy

## 2021-08-21 DIAGNOSIS — M6281 Muscle weakness (generalized): Secondary | ICD-10-CM | POA: Insufficient documentation

## 2021-08-21 DIAGNOSIS — R262 Difficulty in walking, not elsewhere classified: Secondary | ICD-10-CM | POA: Insufficient documentation

## 2021-08-21 DIAGNOSIS — K529 Noninfective gastroenteritis and colitis, unspecified: Secondary | ICD-10-CM | POA: Diagnosis not present

## 2021-08-21 NOTE — Therapy (Signed)
Andrew Arnold, [place], [address] Phone: [phone]   Fax:  [phone]  Physical Therapy Evaluation  Patient Details  Name: Andrew Arnold MRN: [id number] Date of Birth: Oct 16, 1955 Referring Provider (PT): Andrew Arnold   Encounter Date: 08/21/2021   PT End of Session - 08/21/21 1107     Visit Number 1    Number of Visits 12    Date for PT Re-Evaluation 10/02/21    Authorization Type BCBS    Authorization - Visit Number 1    Authorization - Number of Visits 30    Progress Note Due on Visit 10    PT Start Time 0920    PT Stop Time 1005    PT Time Calculation (min) 45 min    Activity Tolerance Patient tolerated treatment well    Behavior During Therapy [facility] for tasks assessed/performed             Past Medical History:  Diagnosis Date   Allergic rhinitis due to pollen    on allergy shots   Arrhythmia    1980s hospitalized for HTN and ?irregular rhythm but no specific dx but resolved during hospitalization   Diabetes mellitus without complication ([place])    HTN (hypertension)    Hyperlipidemia     Past Surgical History:  Procedure Laterality Date   ANTERIOR CERVICAL DECOMP/DISCECTOMY FUSION N/A 06/03/2021   Procedure: Anterior Cervical Decompression Fusion  Cervical three-four;  Surgeon: Andrew Mare, MD;  Location: [place];  Service: Neurosurgery;  Laterality: N/A;   colonoscopy  2005   Dr. Gala Arnold: normal rectum, sigmoid diverticula in colonic mucosa   COLONOSCOPY N/A 02/15/2014   Procedure: COLONOSCOPY;  Surgeon: Andrew Dolin, MD;  [facility];  Service: Endoscopy;  Laterality: N/A;  2:00   RIGHT/LEFT HEART CATH AND CORONARY ANGIOGRAPHY N/A 05/23/2021   Procedure: RIGHT/LEFT HEART CATH AND CORONARY ANGIOGRAPHY;  Surgeon: Andrew Artist, MD;  Location: [place] [facility];  Service: Cardiovascular;  Laterality: N/A;    There were no vitals filed for this visit.    Subjective  Assessment - 08/21/21 6948     Subjective [name] states that he was dx with cervical stenosis and had an ACDF of C3-4 on 11/22.  [name] states that he was using a cane prior to his surgery.  He was not discharged with therapy but has not been able to gain his strength back, he is using a walker at this time and would like to get back to using his cane.  He states that he was admitted into the hospital in January for weakness, they found lung nodulars which he is being worked up for.    Pertinent History ACDF, DM, HTN, lung nodular    Limitations Standing;Walking;House hold activities    How long can you sit comfortably? no problem    How long can you walk comfortably? ambulates with a walker the longest he has walked is to the mailbox and back .    Patient Stated Goals To be able to walk normal without any assistive device    Currently in Pain? No/denies                [facility] PT Assessment - 08/21/21 0001       Assessment   Medical Diagnosis weakness/ difficulty walking    Referring Provider (PT) Andrew Arnold    Onset Date/Surgical Date 06/03/21    Prior Therapy OP in october  Balance Screen   Has the patient fallen in the past 6 months Yes    How many times? 1    Has the patient had a decrease in activity level because of a fear of falling?  Yes    Is the patient reluctant to leave their home because of a fear of falling?  No      Home Environment   Living Environment Private residence    Type of [place] to enter    Entrance Stairs-Number of Steps 5    Entrance Stairs-Rails Right      Prior Function   Level of [place] Independent    Vocation Full time employment    Museum/gallery curator    Leisure cater      Cognition   Overall Cognitive Status Within Functional Limits for tasks assessed      Functional Tests   Functional tests Single leg stance;Sit to Stand      Single Leg Stance   Comments Rt : 1"; Lt 20"       Sit to Stand   Comments 7 in 30 seconds      Strength   Right Hip Flexion 2+/5    Right Hip Extension 2/5    Right Hip ABduction 2/5    Left Hip Flexion 4-/5    Left Hip Extension 3-/5    Left Hip ABduction 3/5    Right Knee Flexion 2/5    Right Knee Extension 3/5    Left Knee Flexion 3+/5    Left Knee Extension 5/5    Right Ankle Dorsiflexion 3-/5    Right Ankle Plantar Flexion 2/5    Left Ankle Dorsiflexion 3-/5    Left Ankle Plantar Flexion 3/5                        Objective measurements completed on examination: See above findings.       [facility] Adult PT Treatment/Exercise - 08/21/21 0001       Exercises   Exercises Knee/Hip      Knee/Hip Exercises: Seated   Long Arc Quad Right;10 reps    Other Seated Knee/Hip Exercises Ankle dorsi/plantarflexion    Sit to [employer] 10 reps      Knee/Hip Exercises: Supine   Bridges 10 reps                     PT Education - 08/21/21 1106     Education Details HEP    Person(s) Educated Patient    Methods Explanation;Handout    Comprehension Returned demonstration              PT Short Term Goals - 08/21/21 1116       PT SHORT TERM GOAL #1   Title PT to be I in HEP to increase mm strength by 1/2 grade to allow pt to feel confident walking inside with a cane.    Time 3    Period Weeks    Status New    Target Date 09/11/21      PT SHORT TERM GOAL #2   Title PT to be able to stand on his Rt LE for 15 seconds to reduce risk of falling    Time 3    Period Weeks    Status New               PT Long Term Goals - 08/21/21 1118  PT LONG TERM GOAL #1   Title Pt to be completing an advance HEP to increase LE strength by one grade to allow pt to go up and down 8 steps in a reciprocal manner.    Time 6    Period Weeks    Target Date 10/02/21      PT LONG TERM GOAL #2   Title PT to be able to balance on both legs for 20 seconds to decrease risk of falling so pt feels confident  walking outside with a cane.    Time 6    Period Weeks    Status New                    Plan - 08/21/21 1108     Clinical Impression Statement [name] is a 66yo male referred to therapy for weakness.  The pt was dx with cervical stenosis and had an ACDF of c3-4 on 11/22.  He then was admitted into the hospital on 1/20 for lung nodulars with SOB.  The pt states that he saw a specialist for that yesterday and he does not feel that there is anything significant going on.  Mr. Stiefel was using a cane and he is currently on a walker, he would like to feel confident walking without any assistive device.  Evaluation demonstates decreased balance, decreased strength and decreased activity tolerance.  Mr. Quinto will benefit from skilled PT to maximize his functional ability.    Personal Factors and Comorbidities Comorbidity 2;Age;Fitness;Time since onset of injury/illness/exacerbation    Comorbidities ACDF, DM, HTN    Examination-Activity Limitations Stairs;Transfers;Lift;Locomotion Level    Examination-Participation Restrictions Occupation;Meal Prep;Yard Work;Community Activity;Cleaning    Stability/Clinical Decision Making Stable/Uncomplicated    Clinical Decision Making Moderate    Rehab Potential Good    PT Frequency 2x / week    PT Duration 6 weeks    PT Treatment/Interventions Patient/family education;Therapeutic activities;Stair training;Balance training;Neuromuscular re-education;Therapeutic exercise    PT Next Visit Plan Progess to heel raises on total gym, side stepping, mini squats, standing hip abduction/extension, and balance activity    PT Home Exercise Plan sitting ankle dorsi/plantarflexion, LAQ, bridge.             Patient will benefit from skilled therapeutic intervention in order to improve the following deficits and impairments:  Decreased strength, Difficulty walking, Decreased mobility, Decreased balance, Decreased range of motion, Decreased knowledge of  use of DME, Decreased activity tolerance, Decreased endurance  Visit Diagnosis: Muscle weakness (generalized) - Plan: PT plan of care cert/re-cert  Difficulty in walking, not elsewhere classified - Plan: PT plan of care cert/re-cert     Problem List Patient Active Problem List   Diagnosis Date Noted   CAP (community acquired pneumonia) 08/01/2021   Nausea and vomiting 08/01/2021   DM2 (diabetes mellitus, type 2) ([place]) 08/01/2021   Coronary artery disease involving native coronary artery of native heart without angina pectoris 07/31/2021   Stenosis of cervical spine with myelopathy ([place]) 06/03/2021   Macrocytic anemia 05/15/2021   Bilateral lower extremity edema 05/15/2021   BPH (benign prostatic hyperplasia) 05/15/2021   Elevated PSA 05/15/2021   Cervical spinal stenosis 05/15/2021   DDD (degenerative disc disease), cervical 05/15/2021   Syncope 05/14/2021   Elevated hemoglobin A1c 04/10/2021   Chest pain 08/29/2017   Fever 08/29/2017   Acute URI 08/29/2017   Moderate dehydration 08/29/2017   Sepsis ([place]) 08/29/2017   Elevated lactic acid level 08/29/2017   Acute gastroenteritis 08/29/2017  Diarrhea 08/29/2017   GERD (gastroesophageal reflux disease) 08/29/2017   Positive D dimer 08/29/2017   Encounter for screening colonoscopy 02/01/2014   Orthostatic hypotension 01/15/2014   Dyslipidemia 08/14/2008   Essential hypertension 08/14/2008   Seasonal and perennial allergic rhinitis 04/23/2007   Rayetta Humphrey, PT CLT [phone]  08/21/2021, 11:21 AM  [facility] [address] [place], [place], [address] Phone: [phone]   Fax:  [phone]  Name: Andrew Arnold MRN: [id number] Date of Birth: [birth year]

## 2021-08-27 ENCOUNTER — Ambulatory Visit (HOSPITAL_COMMUNITY)
Admission: RE | Admit: 2021-08-27 | Discharge: 2021-08-27 | Disposition: A | Discharge status: Home / Self Care | Payer: BC Managed Care – PPO | Source: Ambulatory Visit | Attending: Internal Medicine | Admitting: Internal Medicine

## 2021-08-27 ENCOUNTER — Other Ambulatory Visit: Payer: Self-pay

## 2021-08-27 DIAGNOSIS — R600 Localized edema: Secondary | ICD-10-CM | POA: Insufficient documentation

## 2021-08-27 LAB — BASIC METABOLIC PANEL
Anion gap: 9 (ref 5–15)
BUN: 10 mg/dL (ref 8–23)
CO2: 28 mmol/L (ref 22–32)
Calcium: 8.8 mg/dL — ABNORMAL LOW (ref 8.9–10.3)
Chloride: 104 mmol/L (ref 98–111)
Creatinine, Ser: 0.83 mg/dL (ref 0.61–1.24)
GFR, Estimated: >60 mL/min (ref >60)
Glucose, Bld: 104 mg/dL — ABNORMAL HIGH (ref 70–99)
Potassium: 4.5 mmol/L (ref 3.5–5.1)
Sodium: 141 mmol/L (ref 135–145)

## 2021-09-03 ENCOUNTER — Ambulatory Visit (HOSPITAL_COMMUNITY): Payer: BC Managed Care – PPO | Admitting: Physical Therapy

## 2021-09-03 ENCOUNTER — Other Ambulatory Visit: Payer: Self-pay

## 2021-09-03 DIAGNOSIS — K529 Noninfective gastroenteritis and colitis, unspecified: Secondary | ICD-10-CM | POA: Diagnosis not present

## 2021-09-03 DIAGNOSIS — R262 Difficulty in walking, not elsewhere classified: Secondary | ICD-10-CM

## 2021-09-03 DIAGNOSIS — M6281 Muscle weakness (generalized): Secondary | ICD-10-CM

## 2021-09-03 NOTE — Therapy (Signed)
[place], [place], [address] Phone: [phone]   Fax:  [phone]  Physical Therapy Treatment  Patient Details  Name: Andrew Arnold MRN: [id number] Date of Birth: 04-13-1956 Referring Provider (PT): Lala Lund   Encounter Date: 09/03/2021   PT End of Session - 09/03/21 1038     Visit Number 2    Number of Visits 12    Date for PT Re-Evaluation 10/02/21    Authorization Type BCBS    Authorization - Visit Number 2    Authorization - Number of Visits 30    Progress Note Due on Visit 10    PT Start Time 1031    PT Stop Time 1113    PT Time Calculation (min) 42 min    Activity Tolerance Patient tolerated treatment well    Behavior During Therapy [facility] for tasks assessed/performed             Past Medical History:  Diagnosis Date   Allergic rhinitis due to pollen    on allergy shots   Arrhythmia    1980s hospitalized for HTN and ?irregular rhythm but no specific dx but resolved during hospitalization   Diabetes mellitus without complication ([place])    HTN (hypertension)    Hyperlipidemia     Past Surgical History:  Procedure Laterality Date   ANTERIOR CERVICAL DECOMP/DISCECTOMY FUSION N/A 06/03/2021   Procedure: Anterior Cervical Decompression Fusion  Cervical three-four;  Surgeon: Vallarie Mare, MD;  Location: [facility];  Service: Neurosurgery;  Laterality: N/A;   colonoscopy  2005   Dr. Gala Romney: normal rectum, sigmoid diverticula in colonic mucosa   COLONOSCOPY N/A 02/15/2014   Procedure: COLONOSCOPY;  Surgeon: Daneil Dolin, MD;  [facility];  Service: Endoscopy;  Laterality: N/A;  2:00   RIGHT/LEFT HEART CATH AND CORONARY ANGIOGRAPHY N/A 05/23/2021   Procedure: RIGHT/LEFT HEART CATH AND CORONARY ANGIOGRAPHY;  Surgeon: Jolaine Artist, MD;  Location: [place] [facility];  Service: Cardiovascular;  Laterality: N/A;    There were no vitals filed for this visit.   Subjective  Assessment - 09/03/21 1036     Subjective Patient reports that he has been doing well since the last visit and there have been no falls. He does have slight pain in the Lt shoulder reportedly just by the way he was laying overnight, but he doesn't think it should be recorded because it doesn't happen day to day.    Pertinent History ACDF, DM, HTN, lung nodular    Limitations Standing;Walking;House hold activities    How long can you sit comfortably? no problem    How long can you walk comfortably? ambulates with a walker the longest he has walked is to the mailbox and back .    Patient Stated Goals To be able to walk normal without any assistive device    Currently in Pain? No/denies                               [facility] Adult PT Treatment/Exercise - 09/03/21 0001       Neuro Re-ed    Neuro Re-ed Details  AP, side/side, CW, and CCW weight shifting on black foam x45s each, AP weight shifting on blue foam 2x45s, NS on blue foam with side/side, vertical, and sidebending motion 1x45s each      Knee/Hip Exercises: Aerobic   Nustep x23min      Knee/Hip  Exercises: Standing   Hip ADduction Other (comment)   BW 2x10 each and w/ #3 2x10 each   Other Standing Knee Exercises Hip extension in PBs w/ 3lbs AWs 3x10 each      Knee/Hip Exercises: Seated   Other Seated Knee/Hip Exercises Leg Press #4 1x10, #5 1x12, and #6                       PT Short Term Goals - 08/21/21 1116       PT SHORT TERM GOAL #1   Title PT to be I in HEP to increase mm strength by 1/2 grade to allow pt to feel confident walking inside with a cane.    Time 3    Period Weeks    Status New    Target Date 09/11/21      PT SHORT TERM GOAL #2   Title PT to be able to stand on his Rt LE for 15 seconds to reduce risk of falling    Time 3    Period Weeks    Status New               PT Long Term Goals - 08/21/21 1118       PT LONG TERM GOAL #1   Title Pt to be completing an  advance HEP to increase LE strength by one grade to allow pt to go up and down 8 steps in a reciprocal manner.    Time 6    Period Weeks    Target Date 10/02/21      PT LONG TERM GOAL #2   Title PT to be able to balance on both legs for 20 seconds to decrease risk of falling so pt feels confident walking outside with a cane.    Time 6    Period Weeks    Status New                   Plan - 09/03/21 1038     Clinical Impression Statement Patient tolerated treatment well during today's session and was the most challenged by the balance tasks. Both reaction speed and strategies were below average and frequent UE use was needed in order to prevent LOB. This did slightly improve by the end of the session, but the patient had a tendency to rely on the UEs as opposed to weight shifting or bending at the knees. Narrow stance was was difficult for the patient and cervical rotation motions proved to be the most challenging.    Personal Factors and Comorbidities Comorbidity 2;Age;Fitness;Time since onset of injury/illness/exacerbation    Comorbidities ACDF, DM, HTN    Examination-Activity Limitations Stairs;Transfers;Lift;Locomotion Level    Examination-Participation Restrictions Occupation;Meal Prep;Yard Work;Community Activity;Cleaning    Stability/Clinical Decision Making Stable/Uncomplicated    Rehab Potential Good    PT Frequency 2x / week    PT Duration 6 weeks    PT Treatment/Interventions Patient/family education;Therapeutic activities;Stair training;Balance training;Neuromuscular re-education;Therapeutic exercise    PT Next Visit Plan Progess to heel raises on total gym, side stepping, mini squats, standing hip abduction/extension, and balance activity    PT Home Exercise Plan sitting ankle dorsi/plantarflexion, LAQ, bridge.             Patient will benefit from skilled therapeutic intervention in order to improve the following deficits and impairments:  Decreased strength,  Difficulty walking, Decreased mobility, Decreased balance, Decreased range of motion, Decreased knowledge of use of DME, Decreased activity tolerance, Decreased  endurance  Visit Diagnosis: Muscle weakness (generalized)  Difficulty in walking, not elsewhere classified     Problem List Patient Active Problem List   Diagnosis Date Noted   CAP (community acquired pneumonia) 08/01/2021   Nausea and vomiting 08/01/2021   DM2 (diabetes mellitus, type 2) ([place]) 08/01/2021   Coronary artery disease involving native coronary artery of native heart without angina pectoris 07/31/2021   Stenosis of cervical spine with myelopathy ([place]) 06/03/2021   Macrocytic anemia 05/15/2021   Bilateral lower extremity edema 05/15/2021   BPH (benign prostatic hyperplasia) 05/15/2021   Elevated PSA 05/15/2021   Cervical spinal stenosis 05/15/2021   DDD (degenerative disc disease), cervical 05/15/2021   Syncope 05/14/2021   Elevated hemoglobin A1c 04/10/2021   Chest pain 08/29/2017   Fever 08/29/2017   Acute URI 08/29/2017   Moderate dehydration 08/29/2017   Sepsis ([place]) 08/29/2017   Elevated lactic acid level 08/29/2017   Acute gastroenteritis 08/29/2017   Diarrhea 08/29/2017   GERD (gastroesophageal reflux disease) 08/29/2017   Positive D dimer 08/29/2017   Encounter for screening colonoscopy 02/01/2014   Orthostatic hypotension 01/15/2014   Dyslipidemia 08/14/2008   Essential hypertension 08/14/2008   Seasonal and perennial allergic rhinitis 04/23/2007    Adalberto Cole, PT 09/03/2021, 11:36 AM  [facility] [address] [place], [place], [address] Phone: [phone]   Fax:  [phone]  Name: DERRIN CURREY MRN: [id number] Date of Birth: 1956/03/17

## 2021-09-04 ENCOUNTER — Encounter (HOSPITAL_COMMUNITY): Payer: Self-pay

## 2021-09-04 ENCOUNTER — Ambulatory Visit (HOSPITAL_COMMUNITY): Payer: BC Managed Care – PPO

## 2021-09-04 DIAGNOSIS — R262 Difficulty in walking, not elsewhere classified: Secondary | ICD-10-CM

## 2021-09-04 DIAGNOSIS — K529 Noninfective gastroenteritis and colitis, unspecified: Secondary | ICD-10-CM | POA: Diagnosis not present

## 2021-09-04 DIAGNOSIS — M6281 Muscle weakness (generalized): Secondary | ICD-10-CM | POA: Diagnosis not present

## 2021-09-04 NOTE — Patient Instructions (Signed)
Heel Raise (Calf Strength / Balance)    Stand with support. Rise up on tiptoes, breathing out through pursed lips. Hold position to count of 5". Return slowly, breathing in. Repeat 10 times per session. Do 2 sessions per day. Variation: Do without weights.  Copyright  VHI. All rights reserved. ABDUCTION: Standing (Active)    Stand, feet flat. Lift right leg out to side.  Complete 2 sets of 10 repetitions.   http://gtsc.exer.us/110   Copyright  VHI. All rights reserved.   Hip Extension (Standing)    Stand with support. Squeeze pelvic floor and hold. Move right leg backward with straight knee. Hold for 3 seconds. Repeat 10 times. Do 2 times a day. Repeat with other leg.   Copyright  VHI. All rights reserved.

## 2021-09-04 NOTE — Therapy (Signed)
[facility] [address] [place], [place], [address] Phone: [phone]   Fax:  [phone]  Physical Therapy Treatment  Patient Details  Name: Andrew Arnold MRN: [id number] Date of Birth: [birth year] Referring Provider (PT): Lala Lund   Encounter Date: 09/04/2021   PT End of Session - 09/04/21 1137     Visit Number 3    Number of Visits 12    Date for PT Re-Evaluation 10/02/21    Authorization Type BCBS    Authorization - Visit Number 3    Authorization - Number of Visits 30    Progress Note Due on Visit 10    PT Start Time 1135    PT Stop Time 1214    PT Time Calculation (min) 39 min    Activity Tolerance Patient tolerated treatment well    Behavior During Therapy [facility] for tasks assessed/performed             Past Medical History:  Diagnosis Date   Allergic rhinitis due to pollen    on allergy shots   Arrhythmia    1980s hospitalized for HTN and ?irregular rhythm but no specific dx but resolved during hospitalization   Diabetes mellitus without complication ([place])    HTN (hypertension)    Hyperlipidemia     Past Surgical History:  Procedure Laterality Date   ANTERIOR CERVICAL DECOMP/DISCECTOMY FUSION N/A 06/03/2021   Procedure: Anterior Cervical Decompression Fusion  Cervical three-four;  Surgeon: Vallarie Mare, MD;  Location: [facility];  Service: Neurosurgery;  Laterality: N/A;   colonoscopy  2005   Dr. Gala Romney: normal rectum, sigmoid diverticula in colonic mucosa   COLONOSCOPY N/A 02/15/2014   Procedure: COLONOSCOPY;  Surgeon: Daneil Dolin, MD;  [facility];  Service: Endoscopy;  Laterality: N/A;  2:00   RIGHT/LEFT HEART CATH AND CORONARY ANGIOGRAPHY N/A 05/23/2021   Procedure: RIGHT/LEFT HEART CATH AND CORONARY ANGIOGRAPHY;  Surgeon: Jolaine Artist, MD;  Location: [place] [facility];  Service: Cardiovascular;  Laterality: N/A;    There were no vitals filed for this visit.   Subjective  Assessment - 09/04/21 1136     Subjective Feeling good today with no reports of pain or recent falls.  HEP is going well at home.    Pertinent History ACDF, DM, HTN, lung nodular    Patient Stated Goals To be able to walk normal without any assistive device    Currently in Pain? No/denies                               [facility] Adult PT Treatment/Exercise - 09/04/21 0001       Knee/Hip Exercises: Machines for Strengthening   Other Machine Power Tower 25 degrees heel raises 10x 5" holds 2 sets      Knee/Hip Exercises: Standing   Heel Raises 15 reps    Heel Raises Limitations incline slope    Hip Flexion Both    Hip Flexion Limitations Toe tapping 20x alternating 6in step    Hip Abduction 2 sets;10 reps    Hip Extension 2 sets;10 reps    Rocker Board 2 minutes    Rocker Board Limitations lateral    SLS LT 12" Rt 3"    Other Standing Knee Exercises sidestep 4RT inside ///bars no HHA    Other Standing Knee Exercises Tandem stance static 2x 30", on foam x 30" each      Knee/Hip  Exercises: Seated   Sit to Sand 10 reps;without UE support   eccnetric control                      PT Short Term Goals - 08/21/21 1116       PT SHORT TERM GOAL #1   Title PT to be I in HEP to increase mm strength by 1/2 grade to allow pt to feel confident walking inside with a cane.    Time 3    Period Weeks    Status New    Target Date 09/11/21      PT SHORT TERM GOAL #2   Title PT to be able to stand on his Rt LE for 15 seconds to reduce risk of falling    Time 3    Period Weeks    Status New               PT Long Term Goals - 08/21/21 1118       PT LONG TERM GOAL #1   Title Pt to be completing an advance HEP to increase LE strength by one grade to allow pt to go up and down 8 steps in a reciprocal manner.    Time 6    Period Weeks    Target Date 10/02/21      PT LONG TERM GOAL #2   Title PT to be able to balance on both legs for 20 seconds to  decrease risk of falling so pt feels confident walking outside with a cane.    Time 6    Period Weeks    Status New                   Plan - 09/04/21 1300     Clinical Impression Statement Pt progressing well towards POC.  Session focus with LE strengthening and balance activities.  Pt stable with tandem stance and good control on dynamic surface, increased difficulty wtih SLS based activities.    Personal Factors and Comorbidities Comorbidity 2;Age;Fitness;Time since onset of injury/illness/exacerbation    Comorbidities ACDF, DM, HTN    Examination-Activity Limitations Stairs;Transfers;Lift;Locomotion Level    Examination-Participation Restrictions Occupation;Meal Prep;Yard Work;Community Activity;Cleaning    Stability/Clinical Decision Making Stable/Uncomplicated    Clinical Decision Making Moderate    Rehab Potential Good    PT Frequency 2x / week    PT Duration 6 weeks    PT Treatment/Interventions Patient/family education;Therapeutic activities;Stair training;Balance training;Neuromuscular re-education;Therapeutic exercise    PT Next Visit Plan Begin vector stance, head turns in tandem stance, increase height with total gym heel raises to 27 degrees, begin cane gait training.  Progress strength as able.    PT Home Exercise Plan sitting ankle dorsi/plantarflexion, LAQ, bridge.; 2/23: heel raises, hip abd/ext    Consulted and Agree with Plan of Care Patient             Patient will benefit from skilled therapeutic intervention in order to improve the following deficits and impairments:  Decreased strength, Difficulty walking, Decreased mobility, Decreased balance, Decreased range of motion, Decreased knowledge of use of DME, Decreased activity tolerance, Decreased endurance  Visit Diagnosis: Muscle weakness (generalized)  Difficulty in walking, not elsewhere classified     Problem List Patient Active Problem List   Diagnosis Date Noted   CAP (community acquired  pneumonia) 08/01/2021   Nausea and vomiting 08/01/2021   DM2 (diabetes mellitus, type 2) ([place]) 08/01/2021   Coronary artery disease involving native coronary artery  of native heart without angina pectoris 07/31/2021   Stenosis of cervical spine with myelopathy ([facility]) 06/03/2021   Macrocytic anemia 05/15/2021   Bilateral lower extremity edema 05/15/2021   BPH (benign prostatic hyperplasia) 05/15/2021   Elevated PSA 05/15/2021   Cervical spinal stenosis 05/15/2021   DDD (degenerative disc disease), cervical 05/15/2021   Syncope 05/14/2021   Elevated hemoglobin A1c 04/10/2021   Chest pain 08/29/2017   Fever 08/29/2017   Acute URI 08/29/2017   Moderate dehydration 08/29/2017   Sepsis ([place]) 08/29/2017   Elevated lactic acid level 08/29/2017   Acute gastroenteritis 08/29/2017   Diarrhea 08/29/2017   GERD (gastroesophageal reflux disease) 08/29/2017   Positive D dimer 08/29/2017   Encounter for screening colonoscopy 02/01/2014   Orthostatic hypotension 01/15/2014   Dyslipidemia 08/14/2008   Essential hypertension 08/14/2008   Seasonal and perennial allergic rhinitis 04/23/2007   Ihor Austin, LPTA/CLT; CBIS [phone]  Aldona Lento, PTA 09/04/2021, 1:05 PM  [facility] [address] [place], [place], [address] Phone: [phone]   Fax:  [phone]  Name: LIAN TANORI MRN: [id number] Date of Birth: Aug 26, 1955

## 2021-09-08 ENCOUNTER — Other Ambulatory Visit: Payer: Self-pay

## 2021-09-08 ENCOUNTER — Ambulatory Visit (HOSPITAL_COMMUNITY): Payer: BC Managed Care – PPO

## 2021-09-08 ENCOUNTER — Encounter (HOSPITAL_COMMUNITY): Payer: Self-pay

## 2021-09-08 DIAGNOSIS — R262 Difficulty in walking, not elsewhere classified: Secondary | ICD-10-CM | POA: Diagnosis not present

## 2021-09-08 DIAGNOSIS — M6281 Muscle weakness (generalized): Secondary | ICD-10-CM

## 2021-09-08 DIAGNOSIS — K529 Noninfective gastroenteritis and colitis, unspecified: Secondary | ICD-10-CM | POA: Diagnosis not present

## 2021-09-08 NOTE — Therapy (Signed)
[facility] [place], [place], [address] Phone: [phone]   Fax:  [phone]  Physical Therapy Treatment  Patient Details  Name: Andrew Arnold MRN: [id number] Date of Birth: [birth year] Referring Provider (PT): Lala Lund   Encounter Date: 09/08/2021   PT End of Session - 09/08/21 1031     Visit Number 4    Number of Visits 12    Date for PT Re-Evaluation 10/02/21    Authorization Type BCBS    Authorization - Visit Number 4    Authorization - Number of Visits 30    Progress Note Due on Visit 10    PT Start Time 1019    PT Stop Time 1100    PT Time Calculation (min) 41 min    Activity Tolerance Patient tolerated treatment well    Behavior During Therapy [facility] for tasks assessed/performed             Past Medical History:  Diagnosis Date   Allergic rhinitis due to pollen    on allergy shots   Arrhythmia    1980s hospitalized for HTN and ?irregular rhythm but no specific dx but resolved during hospitalization   Diabetes mellitus without complication ([place])    HTN (hypertension)    Hyperlipidemia     Past Surgical History:  Procedure Laterality Date   ANTERIOR CERVICAL DECOMP/DISCECTOMY FUSION N/A 06/03/2021   Procedure: Anterior Cervical Decompression Fusion  Cervical three-four;  Surgeon: Vallarie Mare, MD;  Location: [facility];  Service: Neurosurgery;  Laterality: N/A;   colonoscopy  2005   Dr. Gala Romney: normal rectum, sigmoid diverticula in colonic mucosa   COLONOSCOPY N/A 02/15/2014   Procedure: COLONOSCOPY;  Surgeon: Daneil Dolin, MD;  [facility];  Service: Endoscopy;  Laterality: N/A;  2:00   RIGHT/LEFT HEART CATH AND CORONARY ANGIOGRAPHY N/A 05/23/2021   Procedure: RIGHT/LEFT HEART CATH AND CORONARY ANGIOGRAPHY;  Surgeon: Jolaine Artist, MD;  Location: [facility];  Service: Cardiovascular;  Laterality: N/A;    There were no vitals filed for this visit.   Subjective  Assessment - 09/08/21 1030     Subjective HEP going well. Low back is stiff but no pain.Has been doing HEP but not bridges because he has to lie on floor for those.    Pertinent History ACDF, DM, HTN, lung nodular    Patient Stated Goals To be able to walk normal without any assistive device    Currently in Pain? No/denies               [facility] Adult PT Treatment/Exercise - 09/08/21 0001       Knee/Hip Exercises: Standing   Heel Raises 15 reps    Heel Raises Limitations incline slope    Hip Flexion Both    Hip Flexion Limitations Toe tapping 25x alternating 6in step    Hip Abduction 2 sets;10 reps    Hip Extension 2 sets;10 reps    Rocker Board 2 minutes    Rocker Board Limitations AP, lateral    Other Standing Knee Exercises sidestep 4RT blue line no HHA    Other Standing Knee Exercises Tandem stance static on foam x 30" each      Knee/Hip Exercises: Supine   Bridges Strengthening;Both;1 set;10 reps    Bridges Limitations 5 sec hold      Knee/Hip Exercises: Sidelying   Hip ABduction Strengthening;Both;10 reps;2 sets    Clams x10 each 2-3 sec hold  PT Education - 09/08/21 1046     Education Details Advance HEP.    Person(s) Educated Patient    Methods Explanation;Handout    Comprehension Verbalized understanding              PT Short Term Goals - 09/08/21 1156       PT SHORT TERM GOAL #1   Title PT to be I in HEP to increase mm strength by 1/2 grade to allow pt to feel confident walking inside with a cane.    Time 3    Period Weeks    Status On-going    Target Date 09/11/21      PT SHORT TERM GOAL #2   Title PT to be able to stand on his Rt LE for 15 seconds to reduce risk of falling    Time 3    Period Weeks    Status On-going               PT Long Term Goals - 09/08/21 1157       PT LONG TERM GOAL #1   Title Pt to be completing an advance HEP to increase LE strength by one grade to allow pt to go up and down 8 steps in a  reciprocal manner.    Time 6    Period Weeks    Status On-going    Target Date 10/02/21      PT LONG TERM GOAL #2   Title PT to be able to balance on both legs for 20 seconds to decrease risk of falling so pt feels confident walking outside with a cane.    Time 6    Period Weeks    Status On-going                   Plan - 09/08/21 1153     Clinical Impression Statement Session focused on lower extremity strengthening and balance challenges. Patient reported he did not feel much glut activiation with standing hip abd or ext and he exhibited compensatory strategies indicating hip weakness. Reviewed supine bridges increasing to a 5 sec hold. Added sidelying hip abduction, clams and prone hip extension to therapeutic exercises and HEP. Added rockerboard A/P and foam tandem stance balance to therapeutic exercises. Patient would continue to benefit from skilled physical therapy to reduce impairment and improve function.    Personal Factors and Comorbidities Comorbidity 2;Age;Fitness;Time since onset of injury/illness/exacerbation    Comorbidities ACDF, DM, HTN    Examination-Activity Limitations Stairs;Transfers;Lift;Locomotion Level    Examination-Participation Restrictions Occupation;Meal Prep;Yard Work;Community Activity;Cleaning    Stability/Clinical Decision Making Stable/Uncomplicated    Rehab Potential Good    PT Frequency 2x / week    PT Duration 6 weeks    PT Treatment/Interventions Patient/family education;Therapeutic activities;Stair training;Balance training;Neuromuscular re-education;Therapeutic exercise    PT Next Visit Plan Begin vector stance, head turns in tandem stance, increase height with total gym heel raises to 27 degrees, begin cane gait training.  Progress strength as able.    PT Home Exercise Plan sitting ankle dorsi/plantarflexion, LAQ, bridge.; 2/23: heel raises, hip abd/ext; 2/27 - sidelying hip abd, clams, prone hip ext    Consulted and Agree with Plan of  Care Patient             Patient will benefit from skilled therapeutic intervention in order to improve the following deficits and impairments:  Decreased strength, Difficulty walking, Decreased mobility, Decreased balance, Decreased range of motion, Decreased knowledge of use of DME, Decreased  activity tolerance, Decreased endurance  Visit Diagnosis: Muscle weakness (generalized)  Difficulty in walking, not elsewhere classified     Problem List Patient Active Problem List   Diagnosis Date Noted   CAP (community acquired pneumonia) 08/01/2021   Nausea and vomiting 08/01/2021   DM2 (diabetes mellitus, type 2) ([place]) 08/01/2021   Coronary artery disease involving native coronary artery of native heart without angina pectoris 07/31/2021   Stenosis of cervical spine with myelopathy ([place]) 06/03/2021   Macrocytic anemia 05/15/2021   Bilateral lower extremity edema 05/15/2021   BPH (benign prostatic hyperplasia) 05/15/2021   Elevated PSA 05/15/2021   Cervical spinal stenosis 05/15/2021   DDD (degenerative disc disease), cervical 05/15/2021   Syncope 05/14/2021   Elevated hemoglobin A1c 04/10/2021   Chest pain 08/29/2017   Fever 08/29/2017   Acute URI 08/29/2017   Moderate dehydration 08/29/2017   Sepsis ([place]) 08/29/2017   Elevated lactic acid level 08/29/2017   Acute gastroenteritis 08/29/2017   Diarrhea 08/29/2017   GERD (gastroesophageal reflux disease) 08/29/2017   Positive D dimer 08/29/2017   Encounter for screening colonoscopy 02/01/2014   Orthostatic hypotension 01/15/2014   Dyslipidemia 08/14/2008   Essential hypertension 08/14/2008   Seasonal and perennial allergic rhinitis 04/23/2007   Floria Raveling. Hartnett-Rands, MS, PT Per [place] [phone]  Jeannie Done, PT 09/08/2021, 11:59 AM  [facility] [address] [place], [place], [address] Phone: [phone]   Fax:  [phone]  Name:  NAMIR NETO MRN: [id number] Date of Birth: 10/04/1955

## 2021-09-08 NOTE — Patient Instructions (Signed)
Access Code: [email or web address] URL: [email or web address] Date: 09/08/2021 Prepared by: Fusako Tanabe Hartnett-Rands  Exercises Prone Hip Extension - 1 x daily - 7 x weekly - 3 sets - 10 reps - 2-3 hold Supine Bridge - 1 x daily - 7 x weekly - 3 sets - 10 reps - 3-5 hold Sidelying Hip Abduction - 1 x daily - 7 x weekly - 3 sets - 10 reps - 2-3 hold Clamshell - 1 x daily - 7 x weekly - 3 sets - 10 reps

## 2021-09-09 ENCOUNTER — Encounter (HOSPITAL_COMMUNITY): Payer: BC Managed Care – PPO

## 2021-09-10 ENCOUNTER — Other Ambulatory Visit: Payer: Self-pay

## 2021-09-10 ENCOUNTER — Ambulatory Visit (HOSPITAL_COMMUNITY)
Admission: RE | Admit: 2021-09-10 | Discharge: 2021-09-10 | Disposition: A | Discharge status: Home / Self Care | Payer: BC Managed Care – PPO | Source: Ambulatory Visit | Attending: Pulmonary Disease | Admitting: Pulmonary Disease

## 2021-09-10 ENCOUNTER — Ambulatory Visit (HOSPITAL_COMMUNITY): Payer: BC Managed Care – PPO | Attending: Internal Medicine | Admitting: Physical Therapy

## 2021-09-10 DIAGNOSIS — M6281 Muscle weakness (generalized): Secondary | ICD-10-CM | POA: Diagnosis not present

## 2021-09-10 DIAGNOSIS — R262 Difficulty in walking, not elsewhere classified: Secondary | ICD-10-CM | POA: Diagnosis not present

## 2021-09-10 DIAGNOSIS — R911 Solitary pulmonary nodule: Secondary | ICD-10-CM | POA: Insufficient documentation

## 2021-09-10 DIAGNOSIS — I3139 Other pericardial effusion (noninflammatory): Secondary | ICD-10-CM | POA: Diagnosis not present

## 2021-09-10 IMAGING — CT CT CHEST SUPER D W/O CM
3 of 6 series · 15 of 36 positions shown, 17 images · non-contrast
Comparison: Chest CT dated [DATE]

CLINICAL DATA: Follow-up lung nodule

EXAM:
CT CHEST WITHOUT CONTRAST
TECHNIQUE: Multidetector CT imaging of the chest was performed using thin slice
collimation for electromagnetic bronchoscopy planning purposes,
without intravenous contrast.
RADIATION DOSE REDUCTION: This exam was performed according to the
departmental dose-optimization program which includes automated
exposure control, adjustment of the mA and/or kV according to
patient size and/or use of iterative reconstruction technique.

[Series 3: super d-thins · axial · 0.64mm/px · z∈[+1324,+1548]mm · 8 of 360 slices shown, 10 images]
[im 40/360  mediastinal]
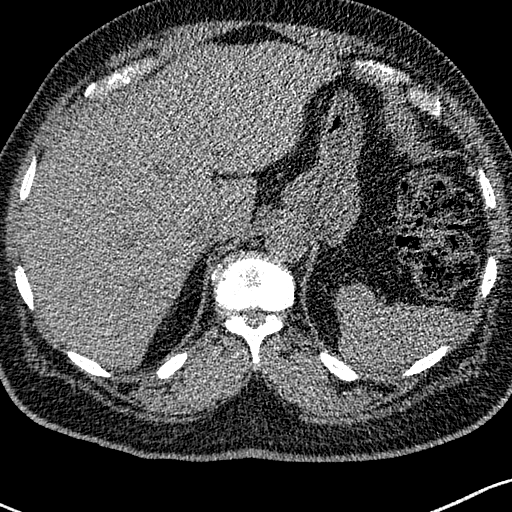
[im 40/360  lung]
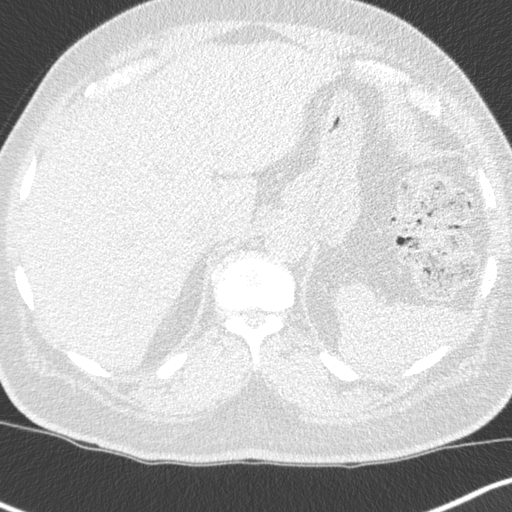
[im 80/360  lung]
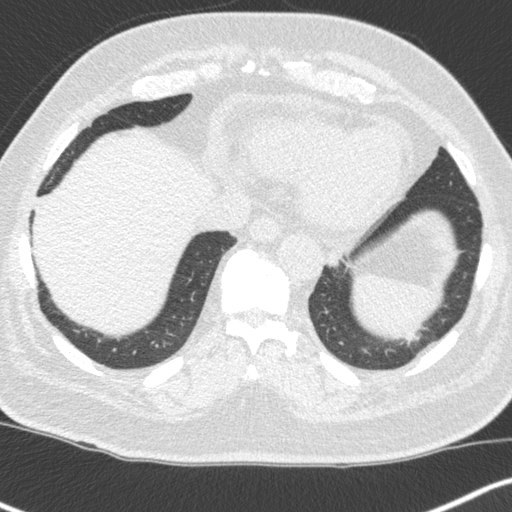
[im 120/360  lung]
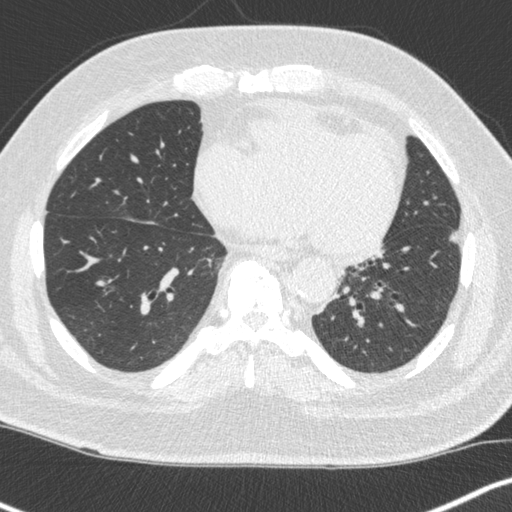
[im 160/360  lung]
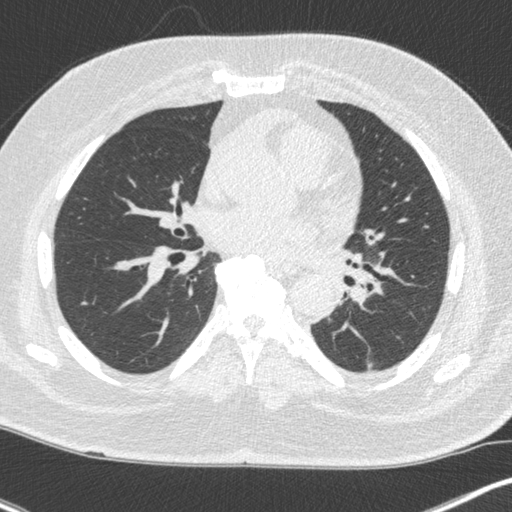
[im 200/360  mediastinal]
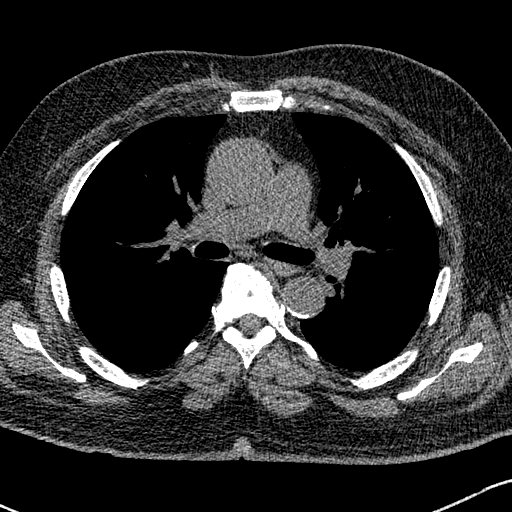
[im 200/360  lung]
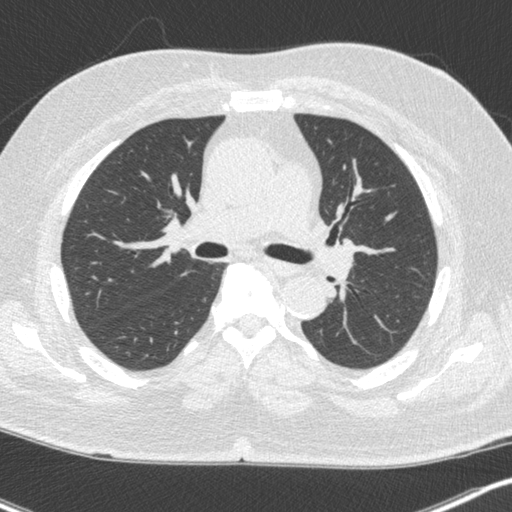
[im 240/360  lung]
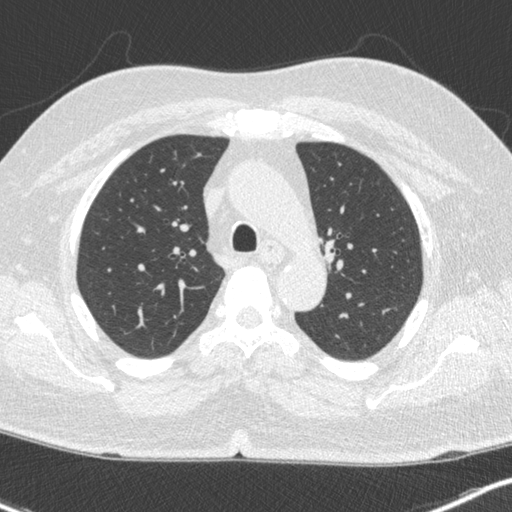
[im 280/360  lung]
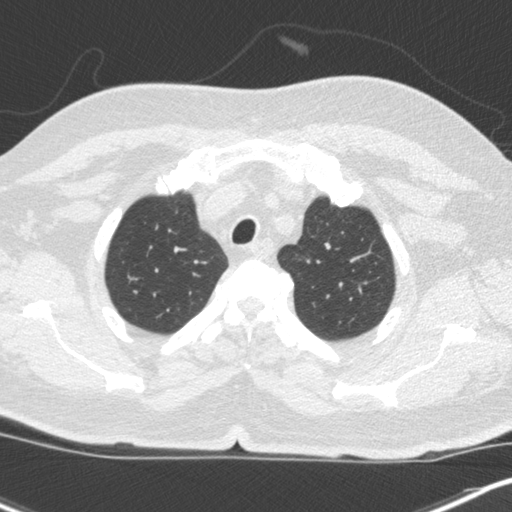
[im 320/360  lung]
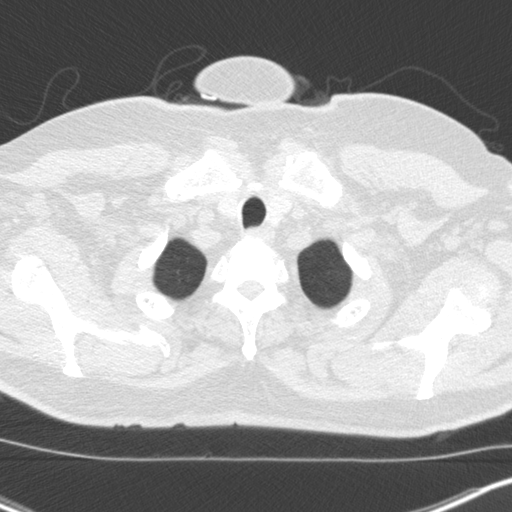

[Series 5: coronal · coronal · 0.59mm/px · 3 of 151 slices shown]
[im 31/151  lung]
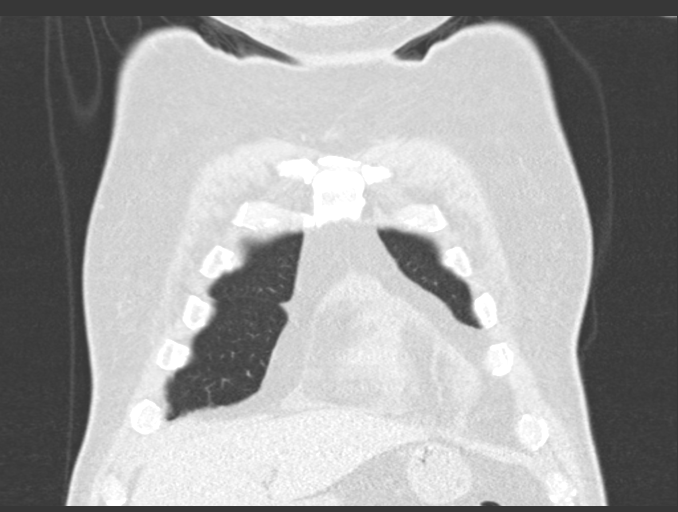
[im 61/151  lung]
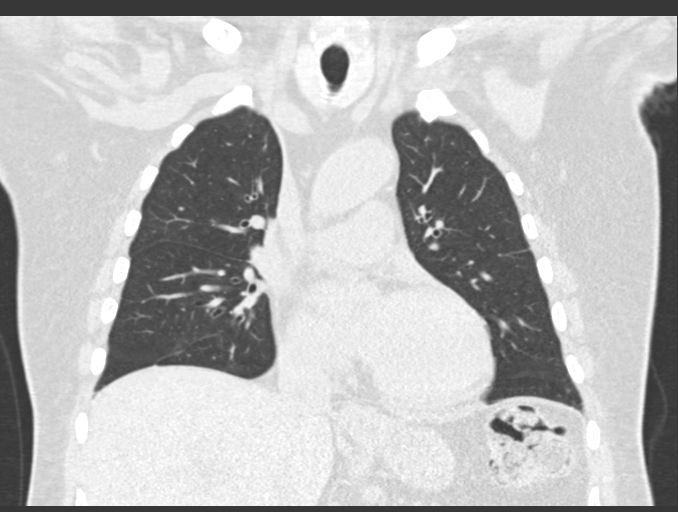
[im 91/151  lung]
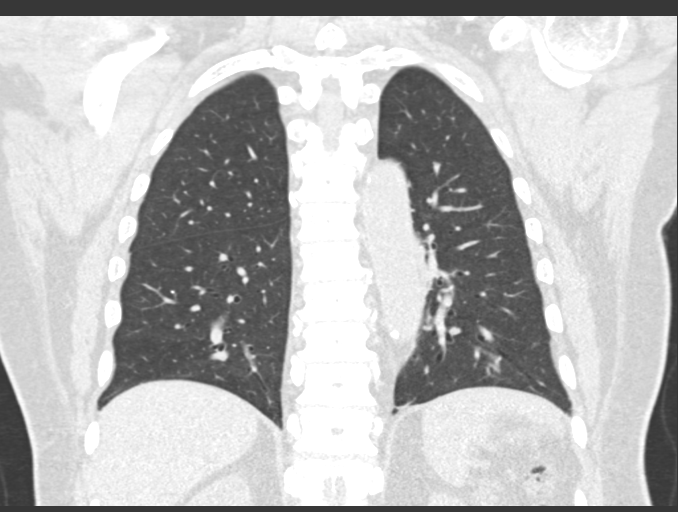

[Series 7: super d lungs · axial · 0.64mm/px · z∈[+1324,+1420]mm · 4 of 360 slices shown]
[im 40/360  lung]
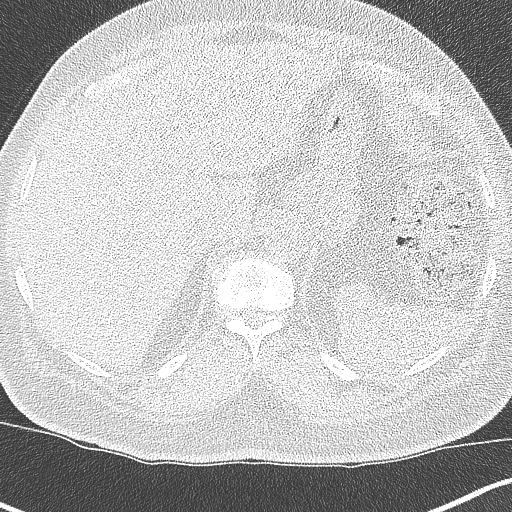
[im 80/360  lung]
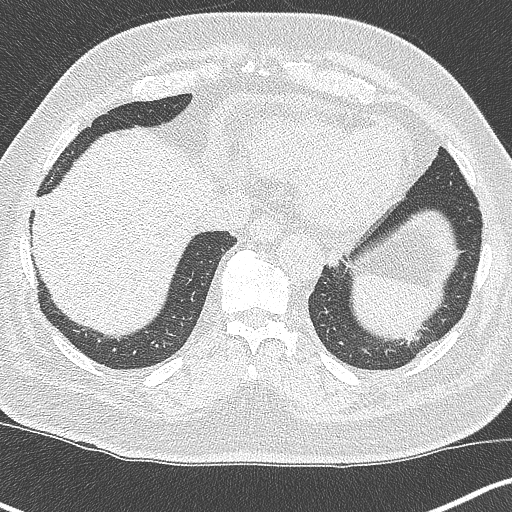
[im 120/360  lung]
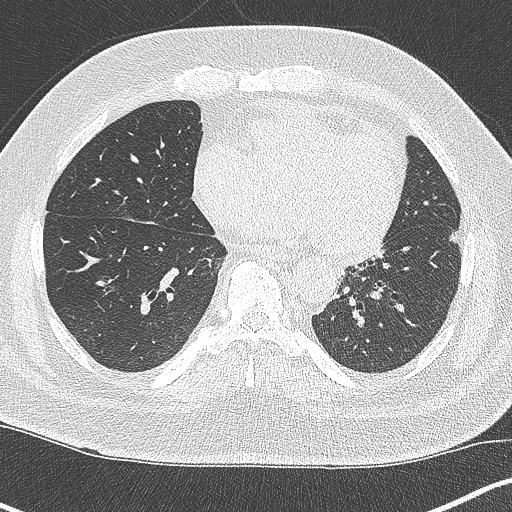
[im 160/360  lung]
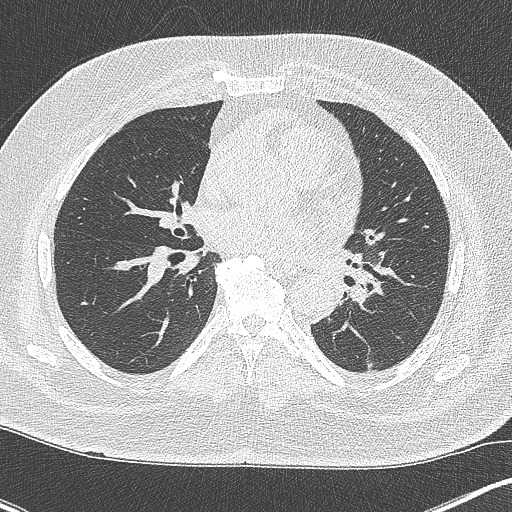

[15 of 36 positions shown; findings below may reference images not displayed]

FINDINGS: Cardiovascular: Normal heart size. Small pericardial effusion,
similar in size to prior exam. Atherosclerotic disease of the
thoracic aorta. Mild coronary artery calcifications of the LAD and
RCA. Dilated ascending thoracic aorta, measuring up to 4.4 cm.

Mediastinum/Nodes: Esophagus and thyroid are unremarkable. No
pathologically enlarged lymph nodes seen in the chest.

Lungs/Pleura: Central airways are patent. No pleural effusion or
pneumothorax. Solid nodule of the left lower lobe measuring 1.2 x
1.0 cm on series 7, image 275, previously measured 1.6 x 1.3 cm.
Decreased nodular consolidation of the medial left lower lobe,
reference area measures 1.2 x 1.2 cm, previously 1.7 x 1.2 cm when
measured in similar location. Stable subpleural nodule of the left
lower lobe measuring 6 mm in mean diameter on series 7 image 239.

Upper Abdomen: No acute abnormality.

Musculoskeletal: No chest wall mass or suspicious bone lesions
identified.
IMPRESSION: 1. Decreased size of nodular opacities of the left lower lobe,
likely due to resolving infectious or inflammatory process.
Recommend additional follow-up chest CT in 3 months to ensure
complete resolution.
2. Stable 6 mm subpleural nodule the left lower lobe. Recommend
attention on follow-up.
3. Dilated ascending thoracic aorta, measuring up to 4.4 cm,
unchanged in size when accounting for differences in measurement
technique.
4. Stable subcentimeter subpleural nodule the left lower lobe.
Recommend attention on follow-up.
5. Coronary artery calcifications and aortic Atherosclerosis
([SK]-[SK]).

## 2021-09-10 NOTE — Therapy (Signed)
[facility] ?[address] ?[place], [place], [address] ?Phone: [phone]   Fax:  [phone] ? ?Physical Therapy Treatment ? ?Patient Details  ?Name: Andrew Arnold ?MRN: [id number] ?Date of Birth: 03-21-1956 ?Referring Provider (PT): Lala Lund ? ? ?Encounter Date: 09/10/2021 ? ? PT End of Session - 09/10/21 1257   ? ? Visit Number 5   ? Number of Visits 12   ? Date for PT Re-Evaluation 10/02/21   ? Authorization Type BCBS   ? Authorization - Visit Number 5   ? Authorization - Number of Visits 30   ? Progress Note Due on Visit 10   ? PT Start Time 3244   ? PT Stop Time 1000   ? PT Time Calculation (min) 40 min   ? Activity Tolerance Patient tolerated treatment well   ? Behavior During Therapy [facility] for tasks assessed/performed   ? ?  ?  ? ?  ? ? ?Past Medical History:  ?Diagnosis Date  ? Allergic rhinitis due to pollen   ? on allergy shots  ? Arrhythmia   ? 1980s hospitalized for HTN and ?irregular rhythm but no specific dx but resolved during hospitalization  ? Diabetes mellitus without complication ([place])   ? HTN (hypertension)   ? Hyperlipidemia   ? ? ?Past Surgical History:  ?Procedure Laterality Date  ? ANTERIOR CERVICAL DECOMP/DISCECTOMY FUSION N/A 06/03/2021  ? Procedure: Anterior Cervical Decompression Fusion  Cervical three-four;  Surgeon: Vallarie Mare, MD;  Location: [facility];  Service: Neurosurgery;  Laterality: N/A;  ? colonoscopy  2005  ? Dr. Gala Romney: normal rectum, sigmoid diverticula in colonic mucosa  ? COLONOSCOPY N/A 02/15/2014  ? Procedure: COLONOSCOPY;  Surgeon: Daneil Dolin, MD;  [facility];  Service: Endoscopy;  Laterality: N/A;  2:00  ? RIGHT/LEFT HEART CATH AND CORONARY ANGIOGRAPHY N/A 05/23/2021  ? Procedure: RIGHT/LEFT HEART CATH AND CORONARY ANGIOGRAPHY;  Surgeon: Jolaine Artist, MD;  Location: [facility];  Service: Cardiovascular;  Laterality: N/A;  ? ? ?There were no vitals filed for this visit. ? ? Subjective  Assessment - 09/10/21 0941   ? ? Subjective pt states he got up too early for a CT scan this morning and is tired.  STates he normally gets up "when I get up" no specific time each morning.  No pain or issues; continues to ambulate with a RW.   ? Currently in Pain? No/denies   ? ?  ?  ? ?  ? ? ? ? ? ? ? ? ? ? ? ? ? ? ? ? ? ? ? ? [facility] [place] Adult PT Treatment/Exercise - 09/10/21 1256   ? ?  ? Knee/Hip Exercises: Standing  ? Heel Raises 15 reps   ? Heel Raises Limitations incline slope   ? Hip Flexion Both   ? Hip Flexion Limitations Toe tapping 25x alternating 6in step   ? Hip Abduction 2 sets;10 reps   ? Hip Extension 2 sets;10 reps   ? Lateral Step Up Both;2 sets;10 reps;Hand Hold: 1;Step Height: 4"   ? Forward Step Up Both;2 sets;10 reps;Hand Hold: 1;Step Height: 4"   ? Rocker Board 2 minutes   ? Rocker Board Limitations AP, lateral   ? SLS LT 10" Rt 2" max of 3 each side   ? Gait Training with SPC   ? Other Standing Knee Exercises sidestep 4RT blue line no HHA   ? Other Standing Knee Exercises Tandem stance static on  foam x 30" each   ? ?  ?  ? ?  ? ? ? ? ? ? ? ? ? ? ? ? PT Short Term Goals - 09/08/21 1156   ? ?  ? PT SHORT TERM GOAL #1  ? Title PT to be I in HEP to increase mm strength by 1/2 grade to allow pt to feel confident walking inside with a cane.   ? Time 3   ? Period Weeks   ? Status On-going   ? Target Date 09/11/21   ?  ? PT SHORT TERM GOAL #2  ? Title PT to be able to stand on his Rt LE for 15 seconds to reduce risk of falling   ? Time 3   ? Period Weeks   ? Status On-going   ? ?  ?  ? ?  ? ? ? ? PT Long Term Goals - 09/08/21 1157   ? ?  ? PT LONG TERM GOAL #1  ? Title Pt to be completing an advance HEP to increase LE strength by one grade to allow pt to go up and down 8 steps in a reciprocal manner.   ? Time 6   ? Period Weeks   ? Status On-going   ? Target Date 10/02/21   ?  ? PT LONG TERM GOAL #2  ? Title PT to be able to balance on both legs for 20 seconds to decrease risk of falling so pt feels  confident walking outside with a cane.   ? Time 6   ? Period Weeks   ? Status On-going   ? ?  ?  ? ?  ? ? ? ? ? ? ? ? Plan - 09/10/21 1258   ? ? Clinical Impression Statement Continued with LE strengthening and static balance challenges this session,  Able to maintain full 30? without UE assist standing on blue foam with Rt leading, intermittent HHA with Lt leading.   Began forward and lateral step ups with 1 UE and eccentric lowering to work on improving strength and stab of LE?s.  Able to complete sidestepping on line without AD or LOB.  Worked on ambulation with SPC this session. Encouraged to acquire a SPC and begin using rather than RW.  Therapist secured tennis balls to back 2 legs on walker as well to reduce friction. No rest breaks needed this session.   ? Personal Factors and Comorbidities Comorbidity 2;Age;Fitness;Time since onset of injury/illness/exacerbation   ? Comorbidities ACDF, DM, HTN   ? Examination-Activity Limitations Stairs;Transfers;Lift;Locomotion Level   ? Examination-Participation Restrictions Occupation;Meal Prep;Yard Work;Community Activity;Cleaning   ? Stability/Clinical Decision Making Stable/Uncomplicated   ? Rehab Potential Good   ? PT Frequency 2x / week   ? PT Duration 6 weeks   ? PT Treatment/Interventions Patient/family education;Therapeutic activities;Stair training;Balance training;Neuromuscular re-education;Therapeutic exercise   ? PT Next Visit Plan Next session begin lunges, vector stance, head turns in tandem stance   ? PT Home Exercise Plan sitting ankle dorsi/plantarflexion, LAQ, bridge.; 2/23: heel raises, hip abd/ext; 2/27 - sidelying hip abd, clams, prone hip ext   ? Consulted and Agree with Plan of Care Patient   ? ?  ?  ? ?  ? ? ?Patient will benefit from skilled therapeutic intervention in order to improve the following deficits and impairments:  Decreased strength, Difficulty walking, Decreased mobility, Decreased balance, Decreased range of motion, Decreased  knowledge of use of DME, Decreased activity tolerance, Decreased endurance ? ?Visit Diagnosis: ?Muscle  weakness (generalized) ? ?Difficulty in walking, not elsewhere classified ? ? ? ? ?Problem List ?Patient Active Problem List  ? Diagnosis Date Noted  ? CAP (community acquired pneumonia) 08/01/2021  ? Nausea and vomiting 08/01/2021  ? DM2 (diabetes mellitus, type 2) ([place]) 08/01/2021  ? Coronary artery disease involving native coronary artery of native heart without angina pectoris 07/31/2021  ? Stenosis of cervical spine with myelopathy ([place]) 06/03/2021  ? Macrocytic anemia 05/15/2021  ? Bilateral lower extremity edema 05/15/2021  ? BPH (benign prostatic hyperplasia) 05/15/2021  ? Elevated PSA 05/15/2021  ? Cervical spinal stenosis 05/15/2021  ? DDD (degenerative disc disease), cervical 05/15/2021  ? Syncope 05/14/2021  ? Elevated hemoglobin A1c 04/10/2021  ? Chest pain 08/29/2017  ? Fever 08/29/2017  ? Acute URI 08/29/2017  ? Moderate dehydration 08/29/2017  ? Sepsis ([place]) 08/29/2017  ? Elevated lactic acid level 08/29/2017  ? Acute gastroenteritis 08/29/2017  ? Diarrhea 08/29/2017  ? GERD (gastroesophageal reflux disease) 08/29/2017  ? Positive D dimer 08/29/2017  ? Encounter for screening colonoscopy 02/01/2014  ? Orthostatic hypotension 01/15/2014  ? Dyslipidemia 08/14/2008  ? Essential hypertension 08/14/2008  ? Seasonal and perennial allergic rhinitis 04/23/2007  ? ?[place] Dembeck [facility] [place], PTA/CLT, WTA ?[phone] ? ?Mare Ferrari, [place] Zarazua B, PTA ?09/10/2021, 1:06 PM ? ?[place] [facility] ?[address] ?[place], [place], [address] ?Phone: [phone]   Fax:  [phone] ? ?Name: ARDIS FULLWOOD ?MRN: [id number] ?Date of Birth: 03-27-1956 ? ? ? ?

## 2021-09-16 ENCOUNTER — Other Ambulatory Visit: Payer: Self-pay

## 2021-09-16 ENCOUNTER — Encounter (HOSPITAL_COMMUNITY): Payer: Self-pay | Admitting: Physical Therapy

## 2021-09-16 ENCOUNTER — Ambulatory Visit (HOSPITAL_COMMUNITY): Payer: BC Managed Care – PPO | Admitting: Physical Therapy

## 2021-09-16 DIAGNOSIS — R262 Difficulty in walking, not elsewhere classified: Secondary | ICD-10-CM | POA: Diagnosis not present

## 2021-09-16 DIAGNOSIS — M6281 Muscle weakness (generalized): Secondary | ICD-10-CM | POA: Diagnosis not present

## 2021-09-16 NOTE — Therapy (Signed)
[facility] ?[address] ?[place], [place], [address] ?Phone: [phone]   Fax:  [phone] ? ?Physical Therapy Treatment ? ?Patient Details  ?Name: Andrew Arnold ?MRN: [id number] ?Date of Birth: Mar 27, 1956 ?Referring Provider (PT): Lala Lund ? ? ?Encounter Date: 09/16/2021 ? ? PT End of Session - 09/16/21 4403   ? ? Visit Number 6   ? Number of Visits 12   ? Date for PT Re-Evaluation 10/02/21   ? Authorization Type BCBS   ? Authorization - Visit Number 6   ? Authorization - Number of Visits 30   ? Progress Note Due on Visit 10   ? PT Start Time 4742   ? PT Stop Time 1000   ? PT Time Calculation (min) 40 min   ? Activity Tolerance Patient tolerated treatment well   ? Behavior During Therapy [facility] for tasks assessed/performed   ? ?  ?  ? ?  ? ? ?Past Medical History:  ?Diagnosis Date  ? Allergic rhinitis due to pollen   ? on allergy shots  ? Arrhythmia   ? 1980s hospitalized for HTN and ?irregular rhythm but no specific dx but resolved during hospitalization  ? Diabetes mellitus without complication ([place])   ? HTN (hypertension)   ? Hyperlipidemia   ? ? ?Past Surgical History:  ?Procedure Laterality Date  ? ANTERIOR CERVICAL DECOMP/DISCECTOMY FUSION N/A 06/03/2021  ? Procedure: Anterior Cervical Decompression Fusion  Cervical three-four;  Surgeon: Vallarie Mare, MD;  Location: [facility];  Service: Neurosurgery;  Laterality: N/A;  ? colonoscopy  2005  ? Dr. Gala Romney: normal rectum, sigmoid diverticula in colonic mucosa  ? COLONOSCOPY N/A 02/15/2014  ? Procedure: COLONOSCOPY;  Surgeon: Daneil Dolin, MD;  [facility];  Service: Endoscopy;  Laterality: N/A;  2:00  ? RIGHT/LEFT HEART CATH AND CORONARY ANGIOGRAPHY N/A 05/23/2021  ? Procedure: RIGHT/LEFT HEART CATH AND CORONARY ANGIOGRAPHY;  Surgeon: Jolaine Artist, MD;  Location: [facility];  Service: Cardiovascular;  Laterality: N/A;  ? ? ?There were no vitals filed for this visit. ? ? Subjective  Assessment - 09/16/21 0919   ? ? Subjective Pt states that he is doing well with his exercises except for the floor ones.   Pt comes to department walking with a quad cane   ? Pertinent History ACDF, DM, HTN, lung nodular   ? Limitations Standing;Walking;House hold activities   ? How long can you sit comfortably? no problem   ? How long can you walk comfortably? ambulates with a walker the longest he has walked is to the mailbox and back .   ? Patient Stated Goals To be able to walk normal without any assistive device   ? Currently in Pain? No/denies   ? ?  ?  ? ?  ? ? ? ? ? ? ? ? ? ? ? ? ? ? ? ? ? ? ? ? [facility] Adult PT Treatment/Exercise - 09/16/21 0001   ? ?  ? Knee/Hip Exercises: Standing  ? Heel Raises 15 reps   ? Forward Step Up Both;Step Height: 6";10 reps;2 sets   ? SLS with Vectors 3 x B with 5" hold   ? Other Standing Knee Exercises side stepping 3 RT, marching x 10 B   ? Other Standing Knee Exercises Tandem stance static on foam x 30" each with head turn   ?  ? Knee/Hip Exercises: Seated  ? Sit to Sand 15 reps   ? ?  ?  ? ?  ? ? ? ? ? ? ? ? ? ? ? ?  PT Short Term Goals - 09/08/21 1156   ? ?  ? PT SHORT TERM GOAL #1  ? Title PT to be I in HEP to increase mm strength by 1/2 grade to allow pt to feel confident walking inside with a cane.   ? Time 3   ? Period Weeks   ? Status On-going   ? Target Date 09/11/21   ?  ? PT SHORT TERM GOAL #2  ? Title PT to be able to stand on his Rt LE for 15 seconds to reduce risk of falling   ? Time 3   ? Period Weeks   ? Status On-going   ? ?  ?  ? ?  ? ? ? ? PT Long Term Goals - 09/08/21 1157   ? ?  ? PT LONG TERM GOAL #1  ? Title Pt to be completing an advance HEP to increase LE strength by one grade to allow pt to go up and down 8 steps in a reciprocal manner.   ? Time 6   ? Period Weeks   ? Status On-going   ? Target Date 10/02/21   ?  ? PT LONG TERM GOAL #2  ? Title PT to be able to balance on both legs for 20 seconds to decrease risk of falling so pt feels confident walking  outside with a cane.   ? Time 6   ? Period Weeks   ? Status On-going   ? ?  ?  ? ?  ? ? ? ? ? ? ? ? Plan - 09/16/21 0942   ? ? Clinical Impression Statement Therapist added head turns with tandem stance, vector stances and marching to activity.  Updated HEP .   ? Personal Factors and Comorbidities Comorbidity 2;Age;Fitness;Time since onset of injury/illness/exacerbation   ? Comorbidities ACDF, DM, HTN   ? Examination-Activity Limitations Stairs;Transfers;Lift;Locomotion Level   ? Examination-Participation Restrictions Occupation;Meal Prep;Yard Work;Community Activity;Cleaning   ? Stability/Clinical Decision Making Stable/Uncomplicated   ? Rehab Potential Good   ? PT Frequency 2x / week   ? PT Duration 6 weeks   ? PT Treatment/Interventions Patient/family education;Therapeutic activities;Stair training;Balance training;Neuromuscular re-education;Therapeutic exercise   ? PT Next Visit Plan Next session begin lunges,   ? PT Home Exercise Plan sitting ankle dorsi/plantarflexion, LAQ, bridge.; 2/23: heel raises, hip abd/ext; 2/27 - sidelying hip abd, clams, prone hip ext;3/7: heelraise, squat, step up , sit to stand   ? Consulted and Agree with Plan of Care Patient   ? ?  ?  ? ?  ? ? ?Patient will benefit from skilled therapeutic intervention in order to improve the following deficits and impairments:  Decreased strength, Difficulty walking, Decreased mobility, Decreased balance, Decreased range of motion, Decreased knowledge of use of DME, Decreased activity tolerance, Decreased endurance ? ?Visit Diagnosis: ?Muscle weakness (generalized) ? ?Difficulty in walking, not elsewhere classified ? ? ? ? ?Problem List ?Patient Active Problem List  ? Diagnosis Date Noted  ? CAP (community acquired pneumonia) 08/01/2021  ? Nausea and vomiting 08/01/2021  ? DM2 (diabetes mellitus, type 2) ([place]) 08/01/2021  ? Coronary artery disease involving native coronary artery of native heart without angina pectoris 07/31/2021  ? Stenosis of  cervical spine with myelopathy ([place]) 06/03/2021  ? Macrocytic anemia 05/15/2021  ? Bilateral lower extremity edema 05/15/2021  ? BPH (benign prostatic hyperplasia) 05/15/2021  ? Elevated PSA 05/15/2021  ? Cervical spinal stenosis 05/15/2021  ? DDD (degenerative disc disease), cervical 05/15/2021  ? Syncope 05/14/2021  ?  Elevated hemoglobin A1c 04/10/2021  ? Chest pain 08/29/2017  ? Fever 08/29/2017  ? Acute URI 08/29/2017  ? Moderate dehydration 08/29/2017  ? Sepsis ([place]) 08/29/2017  ? Elevated lactic acid level 08/29/2017  ? Acute gastroenteritis 08/29/2017  ? Diarrhea 08/29/2017  ? GERD (gastroesophageal reflux disease) 08/29/2017  ? Positive D dimer 08/29/2017  ? Encounter for screening colonoscopy 02/01/2014  ? Orthostatic hypotension 01/15/2014  ? Dyslipidemia 08/14/2008  ? Essential hypertension 08/14/2008  ? Seasonal and perennial allergic rhinitis 04/23/2007  ? ?Rayetta Humphrey, PT CLT ?[id number]  ?09/16/2021, 10:00 AM ? ? ?[facility] ?[address] ?[place], [place], [address] ?Phone: [phone]   Fax:  [phone] ? ?Name: Andrew Arnold ?MRN: [id number] ?Date of Birth: 1956-04-15 ? ? ? ?

## 2021-09-17 ENCOUNTER — Ambulatory Visit (INDEPENDENT_AMBULATORY_CARE_PROVIDER_SITE_OTHER): Payer: BC Managed Care – PPO | Admitting: Pulmonary Disease

## 2021-09-17 ENCOUNTER — Encounter: Payer: Self-pay | Admitting: Pulmonary Disease

## 2021-09-17 VITALS — BP 116/68 | HR 75 | Temp 97.5°F | Ht 69.0 in | Wt 229.4 lb

## 2021-09-17 DIAGNOSIS — R918 Other nonspecific abnormal finding of lung field: Secondary | ICD-10-CM

## 2021-09-17 DIAGNOSIS — R911 Solitary pulmonary nodule: Secondary | ICD-10-CM

## 2021-09-17 NOTE — Patient Instructions (Addendum)
Thank you for visiting Dr. Valeta Harms at [facility]. ?Today we recommend the following: ?Orders Placed This Encounter  ?Procedures  ? CT Chest Wo Contrast  ? ?Return in about 3 months (around 12/18/2021) for with Eric Form, NP . ? ? ? ?Please do your part to reduce the spread of COVID-19.  ? ?

## 2021-09-17 NOTE — Progress Notes (Signed)
Synopsis: Referred in February 2023 for lung nodule by [name], MD  Subjective:   PATIENT ID: Andrew Arnold GENDER: male DOB: 1956/05/01, MRN: [id number]  Chief Complaint  Patient presents with   Follow-up    Follow up. Patient has no complaints    This is a 66 year old gentleman, past medical history of hypertension, hyperlipidemia, type 2 diabetes referred for evaluation of lung nodule by Dr. Candiss Norse.Patient had a CT scan of the chest on presentation to the hospital on 07/30/2021.  The CT scan of the chest revealed multiple patchy nodular densities in the left lower lobe largest of which was 2.5 cm inside suggestive of early pneumonia.  Possibility 1 of these nodular areas being a malignancy was not quite excluded based on CT and they recommended short-term CT scan follow-up.  Patient was referred here today for evaluation and close follow-up of abnormal CT imaging.  From respiratory standpoint patient doing well.  We reviewed his CT imaging today in the office.  Patient's mother had breast cancer, no family history of lung cancer.  OV 09/17/2021:Here today for follow-up after recent CT scan of the chest.  This was in follow-up for lung nodules.  This showed decreased size of the nodular opacities in the left lower lobe concerning for an inflammatory or infectious process.  Has a stable 6 mm subpleural nodule.  We reviewed patient's CT imaging today in the office.   Past Medical History:  Diagnosis Date   Allergic rhinitis due to pollen    on allergy shots   Arrhythmia    1980s hospitalized for HTN and ?irregular rhythm but no specific dx but resolved during hospitalization   Diabetes mellitus without complication ([place])    HTN (hypertension)    Hyperlipidemia      Family History  Problem Relation Age of Onset   Cancer Mother    Cirrhosis Father        cirrhosis of the liver; alcohol related   Diabetes Sister    Other Other        neice was on allergy vaccine when young    Colon cancer Neg Hx      Past Surgical History:  Procedure Laterality Date   ANTERIOR CERVICAL DECOMP/DISCECTOMY FUSION N/A 06/03/2021   Procedure: Anterior Cervical Decompression Fusion  Cervical three-four;  Surgeon: Vallarie Mare, MD;  Location: [facility];  Service: Neurosurgery;  Laterality: N/A;   colonoscopy  2005   Dr. Gala Romney: normal rectum, sigmoid diverticula in colonic mucosa   COLONOSCOPY N/A 02/15/2014   Procedure: COLONOSCOPY;  Surgeon: Daneil Dolin, MD;  [facility];  Service: Endoscopy;  Laterality: N/A;  2:00   RIGHT/LEFT HEART CATH AND CORONARY ANGIOGRAPHY N/A 05/23/2021   Procedure: RIGHT/LEFT HEART CATH AND CORONARY ANGIOGRAPHY;  Surgeon: Jolaine Artist, MD;  Location: [facility];  Service: Cardiovascular;  Laterality: N/A;    Social History   Socioeconomic History   Marital status: Single    Spouse name: Not on file   Number of children: Not on file   Years of education: Not on file   Highest education level: Not on file  Occupational History   Occupation: school counselor  Tobacco Use   Smoking status: Never   Smokeless tobacco: Never  Vaping Use   Vaping Use: Never used  Substance and Sexual Activity   Alcohol use: No   Drug use: No   Sexual activity: Yes  Other Topics Concern   Not on file  Social  History Narrative   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 30 tablet 0   ascorbic acid (VITAMIN C) 500 MG tablet Take 500 mg by  mouth every morning.      aspirin 81 MG chewable tablet Chew 1 tablet (81 mg total) by mouth daily. 30 tablet 0   atorvastatin (LIPITOR) 80 MG tablet Take 80 mg by mouth at bedtime.     B Complex Vitamins (B COMPLEX 100 PO) Take 1 tablet by mouth every morning.      carvedilol (COREG) 6.25 MG tablet Take 1 tablet (6.25 mg total) by mouth 2 (two) times daily with a meal. 60 tablet 0   cetirizine (ZYRTEC) 10 MG tablet Take 10 mg by mouth in the morning.     metFORMIN (GLUCOPHAGE) 1000 MG tablet Take 1,000 mg by mouth 2 (two) times daily.     potassium chloride SA (KLOR-CON M20) 20 MEQ tablet Take 1 tablet (20 mEq total) by mouth daily. 90 tablet 3   sacubitril-valsartan (ENTRESTO) 24-26 MG Take 1 tablet by mouth 2 (two) times daily. 180 tablet 3   tamsulosin (FLOMAX) 0.4 MG CAPS capsule Take 1 capsule (0.4 mg total) by mouth daily. 30 capsule 11   Magnesium Oxide 200 MG TABS Take 1 tablet (200 mg total) by mouth daily. (Patient not taking: Reported on 09/17/2021) 90 tablet 3   pantoprazole (PROTONIX) 40 MG tablet Take 1 tablet (40 mg total) by mouth daily. (Patient not taking: Reported on 09/17/2021) 30 tablet 0   No facility-administered medications prior to visit.    Review of Systems  Constitutional:  Negative for chills, fever, malaise/fatigue and weight loss.  HENT:  Negative for hearing loss, sore throat and tinnitus.   Eyes:  Negative for blurred vision and double vision.  Respiratory:  Negative for cough, hemoptysis, sputum production, shortness of breath, wheezing and stridor.   Cardiovascular:  Negative for chest pain, palpitations, orthopnea, leg swelling and PND.  Gastrointestinal:  Negative for abdominal pain, constipation, diarrhea, heartburn, nausea and vomiting.  Genitourinary:  Negative for dysuria, hematuria and urgency.  Musculoskeletal:  Negative for joint pain and myalgias.  Skin:  Negative for itching and rash.  Neurological:  Negative for dizziness, tingling, weakness and  headaches.  Endo/Heme/Allergies:  Negative for environmental allergies. Does not bruise/bleed easily.  Psychiatric/Behavioral:  Negative for depression. The patient is not nervous/anxious and does not have insomnia.   All other systems reviewed and are negative.   Objective:  Physical Exam Vitals reviewed.  Constitutional:      General: He is not in acute distress.    Appearance: He is well-developed. He is obese.  HENT:     Head: Normocephalic and atraumatic.  Eyes:     General: No scleral icterus.    Conjunctiva/sclera: Conjunctivae normal.     Pupils: Pupils are equal, round, and reactive to light.  Neck:  Vascular: No JVD.     Trachea: No tracheal deviation.  Cardiovascular:     Rate and Rhythm: Normal rate and regular rhythm.     Heart sounds: Normal heart sounds. No murmur heard. Pulmonary:     Effort: Pulmonary effort is normal. No tachypnea, accessory muscle usage or respiratory distress.     Breath sounds: No stridor. No wheezing, rhonchi or rales.  Abdominal:     General: There is no distension.     Palpations: Abdomen is soft.     Tenderness: There is no abdominal tenderness.  Musculoskeletal:        General: No tenderness.     Cervical back: Neck supple.  Lymphadenopathy:     Cervical: No cervical adenopathy.  Skin:    General: Skin is warm and dry.     Capillary Refill: Capillary refill takes less than 2 seconds.     Findings: No rash.  Neurological:     Mental Status: He is alert and oriented to person, place, and time.  Psychiatric:        Behavior: Behavior normal.     Vitals:   09/17/21 1008  BP: 116/68  Pulse: 75  Temp: (!) 97.5 F (36.4 C)  TempSrc: Oral  SpO2: 100%  Weight: 229 lb 6.4 oz (104.1 kg)  Height: '5\' 9"'$  (1.753 m)   100% on RA BMI Readings from Last 3 Encounters:  09/17/21 33.88 kg/m  08/20/21 34.20 kg/m  08/13/21 35.06 kg/m   Wt Readings from Last 3 Encounters:  09/17/21 229 lb 6.4 oz (104.1 kg)  08/20/21 231 lb 9.6  oz (105.1 kg)  08/13/21 237 lb 6.4 oz (107.7 kg)     CBC    Component Value Date/Time   WBC 4.6 08/05/2021 0119   RBC 3.30 (L) 08/05/2021 0119   HGB 10.6 (L) 08/05/2021 0119   HCT 32.5 (L) 08/05/2021 0119   PLT 250 08/05/2021 0119   MCV 98.5 08/05/2021 0119   MCH 32.1 08/05/2021 0119   MCHC 32.6 08/05/2021 0119   RDW 13.2 08/05/2021 0119   LYMPHSABS 1.1 08/05/2021 0119   MONOABS 0.5 08/05/2021 0119   EOSABS 0.1 08/05/2021 0119   BASOSABS 0.0 08/05/2021 0119    Chest Imaging: 07/30/2021 CTA chest: No PE Left lower lobe pulmonary nodule, largest rounded lesion at 1.7 cm.  Other inflammatory type infiltrates present as well.  Smaller nodule in the subpleural region. The patient's images have been independently reviewed by me.    09/10/2021 CT chest: Left lower lobe nodular opacities slowly resolving decreased in size, consistent with inflammatory infiltrate. The patient's images have been independently reviewed by me.    Pulmonary Functions Testing Results: No flowsheet data found.  FeNO:   Pathology:   Echocardiogram:   Heart Catheterization:     Assessment & Plan:     ICD-10-CM   1. Lung nodule  R91.1 CT Chest Wo Contrast    2. Abnormal CT scan, lung  R91.8       Discussion:  This is a 66 year old gentleman seen today for follow-up of lung nodule.  He has heart failure on Entresto.  Had a hospitalization showing 1.7 cm rounded nodule.  Follow-up CT imaging today reviewed with patient that shows the nodule decreasing in size.  Is still not completely resolved so I do think he needs a repeat images.  Plan: Repeat noncontrast CT scan of the chest in 3 months. We reviewed images today in the office. We will have him follow-up with [place]  in 3 months after CT imaging to see me or Barbaraann Barthel, NP.    Current Outpatient Medications:    amLODipine (NORVASC) 10 MG tablet, Take 1 tablet (10 mg total) by mouth daily., Disp: 30 tablet, Rfl: 0   ascorbic acid (VITAMIN C)  500 MG tablet, Take 500 mg by mouth every morning. , Disp: , Rfl:    aspirin 81 MG chewable tablet, Chew 1 tablet (81 mg total) by mouth daily., Disp: 30 tablet, Rfl: 0   atorvastatin (LIPITOR) 80 MG tablet, Take 80 mg by mouth at bedtime., Disp: , Rfl:    B Complex Vitamins (B COMPLEX 100 PO), Take 1 tablet by mouth every morning. , Disp: , Rfl:    carvedilol (COREG) 6.25 MG tablet, Take 1 tablet (6.25 mg total) by mouth 2 (two) times daily with a meal., Disp: 60 tablet, Rfl: 0   cetirizine (ZYRTEC) 10 MG tablet, Take 10 mg by mouth in the morning., Disp: , Rfl:    metFORMIN (GLUCOPHAGE) 1000 MG tablet, Take 1,000 mg by mouth 2 (two) times daily., Disp: , Rfl:    potassium chloride SA (KLOR-CON M20) 20 MEQ tablet, Take 1 tablet (20 mEq total) by mouth daily., Disp: 90 tablet, Rfl: 3   sacubitril-valsartan (ENTRESTO) 24-26 MG, Take 1 tablet by mouth 2 (two) times daily., Disp: 180 tablet, Rfl: 3   tamsulosin (FLOMAX) 0.4 MG CAPS capsule, Take 1 capsule (0.4 mg total) by mouth daily., Disp: 30 capsule, Rfl: 11   Magnesium Oxide 200 MG TABS, Take 1 tablet (200 mg total) by mouth daily. (Patient not taking: Reported on 09/17/2021), Disp: 90 tablet, Rfl: 3   pantoprazole (PROTONIX) 40 MG tablet, Take 1 tablet (40 mg total) by mouth daily. (Patient not taking: Reported on 09/17/2021), Disp: 30 tablet, Rfl: 0   Garner Nash, DO [facility] 09/17/2021 10:35 AM

## 2021-09-18 ENCOUNTER — Other Ambulatory Visit: Payer: Self-pay

## 2021-09-18 ENCOUNTER — Ambulatory Visit (HOSPITAL_COMMUNITY): Payer: BC Managed Care – PPO | Admitting: Physical Therapy

## 2021-09-18 DIAGNOSIS — R262 Difficulty in walking, not elsewhere classified: Secondary | ICD-10-CM | POA: Diagnosis not present

## 2021-09-18 DIAGNOSIS — M6281 Muscle weakness (generalized): Secondary | ICD-10-CM | POA: Diagnosis not present

## 2021-09-18 NOTE — Therapy (Signed)
[facility] ?[place] ?[address] ?[place], [place], [address] ?Phone: [phone]   Fax:  [phone] ? ?Physical Therapy Treatment ? ?Patient Details  ?Name: Andrew Arnold ?MRN: [id number] ?Date of Birth: 1955/12/25 ?Referring Provider (PT): Lala Lund ? ? ?Encounter Date: 09/18/2021 ? ? PT End of Session - 09/18/21 0921   ? ? Visit Number 7   ? Number of Visits 12   ? Date for PT Re-Evaluation 10/02/21   ? Authorization Type BCBS   ? Authorization - Visit Number 7   ? Authorization - Number of Visits 30   ? Progress Note Due on Visit 10   ? PT Start Time 3557   ? PT Stop Time 1000   ? PT Time Calculation (min) 45 min   ? Activity Tolerance Patient tolerated treatment well   ? Behavior During Therapy [facility] for tasks assessed/performed   ? ?  ?  ? ?  ? ? ?Past Medical History:  ?Diagnosis Date  ? Allergic rhinitis due to pollen   ? on allergy shots  ? Arrhythmia   ? 1980s hospitalized for HTN and ?irregular rhythm but no specific dx but resolved during hospitalization  ? Diabetes mellitus without complication ([place])   ? HTN (hypertension)   ? Hyperlipidemia   ? ? ?Past Surgical History:  ?Procedure Laterality Date  ? ANTERIOR CERVICAL DECOMP/DISCECTOMY FUSION N/A 06/03/2021  ? Procedure: Anterior Cervical Decompression Fusion  Cervical three-four;  Surgeon: Vallarie Mare, MD;  Location: [facility];  Service: Neurosurgery;  Laterality: N/A;  ? colonoscopy  2005  ? Dr. Gala Romney: normal rectum, sigmoid diverticula in colonic mucosa  ? COLONOSCOPY N/A 02/15/2014  ? Procedure: COLONOSCOPY;  Surgeon: Daneil Dolin, MD;  [facility];  Service: Endoscopy;  Laterality: N/A;  2:00  ? RIGHT/LEFT HEART CATH AND CORONARY ANGIOGRAPHY N/A 05/23/2021  ? Procedure: RIGHT/LEFT HEART CATH AND CORONARY ANGIOGRAPHY;  Surgeon: Jolaine Artist, MD;  Location: [facility];  Service: Cardiovascular;  Laterality: N/A;  ? ? ?There were no vitals filed for this visit. ? ? Subjective  Assessment - 09/18/21 0925   ? ? Subjective PT states that he is having a little soreness in his Rt shoulder and low back is sore   ? Pertinent History ACDF, DM, HTN, lung nodular   ? Limitations Standing;Walking;House hold activities   ? How long can you sit comfortably? no problem   ? How long can you walk comfortably? ambulates with a walker the longest he has walked is to the mailbox and back .   ? Patient Stated Goals To be able to walk normal without any assistive device   ? Currently in Pain? Yes   ? Pain Score 2    ? Pain Location Back   ? Pain Orientation Right;Lower   ? Pain Descriptors / Indicators Sore   ? Pain Type Acute pain   ? Pain Onset Yesterday   ? Pain Frequency Intermittent   ? Aggravating Factors  activity   ? Effect of Pain on Daily Activities limits   ? ?  ?  ? ?  ? ? ? ? ? ? ? ? ? ? ? ? ? ? ? ? ? ? ? ? [facility] PT Treatment/Exercise - 09/18/21 0001   ? ?  ? Exercises  ? Exercises Knee/Hip   ?  ? Knee/Hip Exercises: Stretches  ? Other Knee/Hip Stretches hip excursion x 3   ?  ? Knee/Hip  Exercises: Standing  ? Forward Lunges Both;20 reps   ? Forward Lunges Limitations 4" step   ? Forward Step Up Both;15 reps;Hand Hold: 2;Step Height: 6"   ? Other Standing Knee Exercises forearm supported at counter hip extension x 10 B; standing hip abduction x 10 ; sidestepping and marching x 10   ? Other Standing Knee Exercises Tandem stance static on foam x 30" each with head turn   ? ?  ?  ? ?  ? ? ? ? ? ? ? ? ? ? ? ? PT Short Term Goals - 09/18/21 0924   ? ?  ? PT SHORT TERM GOAL #1  ? Title PT to be I in HEP to increase mm strength by 1/2 grade to allow pt to feel confident walking inside with a cane.   ? Time 3   ? Period Weeks   ? Status On-going   ? Target Date 09/11/21   ?  ? PT SHORT TERM GOAL #2  ? Title PT to be able to stand on his Rt LE for 15 seconds to reduce risk of falling   ? Time 3   ? Period Weeks   ? Status On-going   ? ?  ?  ? ?  ? ? ? ? PT Long Term Goals - 09/18/21 0924   ? ?  ? PT  LONG TERM GOAL #1  ? Title Pt to be completing an advance HEP to increase LE strength by one grade to allow pt to go up and down 8 steps in a reciprocal manner.   ? Time 6   ? Period Weeks   ? Status On-going   ? Target Date 10/02/21   ?  ? PT LONG TERM GOAL #2  ? Title PT to be able to balance on both legs for 20 seconds to decrease risk of falling so pt feels confident walking outside with a cane.   ? Time 6   ? Period Weeks   ? Status On-going   ? ?  ?  ? ?  ? ? ? ? ? ? ? ? Plan - 09/18/21 0922   ? ? Clinical Impression Statement Added lunges to pt program;  Pt continues to be more functional at home.  Will continue to benefit from skilled therapy for high end strengthening and balance.   ? Personal Factors and Comorbidities Comorbidity 2;Age;Fitness;Time since onset of injury/illness/exacerbation   ? Comorbidities ACDF, DM, HTN   ? Examination-Activity Limitations Stairs;Transfers;Lift;Locomotion Level   ? Examination-Participation Restrictions Occupation;Meal Prep;Yard Work;Community Activity;Cleaning   ? Stability/Clinical Decision Making Stable/Uncomplicated   ? Rehab Potential Good   ? PT Frequency 2x / week   ? PT Duration 6 weeks   ? PT Treatment/Interventions Patient/family education;Therapeutic activities;Stair training;Balance training;Neuromuscular re-education;Therapeutic exercise   ? PT Next Visit Plan continue vector stances, add heel toe gt   ? PT Home Exercise Plan sitting ankle dorsi/plantarflexion, LAQ, bridge.; 2/23: heel raises, hip abd/ext; 2/27 - sidelying hip abd, clams, prone hip ext;3/7: heelraise, squat, step up , sit to stand   ? Consulted and Agree with Plan of Care Patient   ? ?  ?  ? ?  ? ? ?Patient will benefit from skilled therapeutic intervention in order to improve the following deficits and impairments:  Decreased strength, Difficulty walking, Decreased mobility, Decreased balance, Decreased range of motion, Decreased knowledge of use of DME, Decreased activity tolerance,  Decreased endurance ? ?Visit Diagnosis: ?Muscle weakness (generalized) ? ?Difficulty in  walking, not elsewhere classified ? ? ? ? ?Problem List ?Patient Active Problem List  ? Diagnosis Date Noted  ? CAP (community acquired pneumonia) 08/01/2021  ? Nausea and vomiting 08/01/2021  ? DM2 (diabetes mellitus, type 2) ([place]) 08/01/2021  ? Coronary artery disease involving native coronary artery of native heart without angina pectoris 07/31/2021  ? Stenosis of cervical spine with myelopathy ([place]) 06/03/2021  ? Macrocytic anemia 05/15/2021  ? Bilateral lower extremity edema 05/15/2021  ? BPH (benign prostatic hyperplasia) 05/15/2021  ? Elevated PSA 05/15/2021  ? Cervical spinal stenosis 05/15/2021  ? DDD (degenerative disc disease), cervical 05/15/2021  ? Syncope 05/14/2021  ? Elevated hemoglobin A1c 04/10/2021  ? Chest pain 08/29/2017  ? Fever 08/29/2017  ? Acute URI 08/29/2017  ? Moderate dehydration 08/29/2017  ? Sepsis ([place]) 08/29/2017  ? Elevated lactic acid level 08/29/2017  ? Acute gastroenteritis 08/29/2017  ? Diarrhea 08/29/2017  ? GERD (gastroesophageal reflux disease) 08/29/2017  ? Positive D dimer 08/29/2017  ? Encounter for screening colonoscopy 02/01/2014  ? Orthostatic hypotension 01/15/2014  ? Dyslipidemia 08/14/2008  ? Essential hypertension 08/14/2008  ? Seasonal and perennial allergic rhinitis 04/23/2007  ? ?Rayetta Humphrey, PT CLT ?[id number]  ?09/18/2021, 10:01 AM ? ?[facility] ?[place] ?[address] ?[place], [place], [address] ?Phone: [phone]   Fax:  [phone] ? ?Name: Andrew Arnold ?MRN: [id number] ?Date of Birth: [birth year] ? ? ? ?

## 2021-09-22 NOTE — Progress Notes (Incomplete)
ADVANCED HF CLINIC NOTE   Primary Care: Dr Karie Kirks  HF Cardiologist: Dr. Haroldine Laws Neuro Surgeon: Dr Marcello Moores   HPI: [name] is a 66 y.o. with history of syncope, HTN, DM2, HL and diastolic HF  Followed by neuro urgery for RUE/LUE weakness.. CT with cervical spine stenosis  On 05/15/2021 presented ED with syncope. Standing in front of the refrigerator then woke up on the floor.  EKG SR 64 bpm. ECHO EF 65-70%, LVH, grade II DD, and normal RV. Carotids and lower extremity dopplers normal.  Seen in [facility] with progessive LE edema and SOB. Meds adjusted and referred for cath as part of pre-op clearance. Cath 05/23/21 as below non-obstructive CAD and normal RHC.  He underwent successful cervical decompression on 78/24/23 without complication.   Doing well at follow up 11/22, dilt stopped and Entresto started.  Admitted 1/23 with PNA/sepsis. CT chest/abd/pelvis findings suspicious for PNA vs malignancy. Hospitalization c/b AKI and low BP, Entresto, HCTZ, and clonidine stopped at discharge.  Today he returns for post hospital HF follow up. Overall feeling fine. He is not SOB with walking on flat ground, lifting or going up stairs. Denies palpitations, abnormal bleeding, CP, dizziness, edema, or PND/Orthopnea. Has left thigh numbess. Appetite ok. No fever or chills. He does not check weight at home. Taking all medications. Swelling controlled with compression stockings. BP at home 130-160s/90s today.  Cardiac Studies: - Echo (11/22): EF 65-70%, LVH, grade II DD, and normal RV.   - Cath 05/23/21     Prox RCA lesion is 20% stenosed.   Prox Cx lesion is 30% stenosed.   Prox LAD to Mid LAD lesion is 30% stenosed.   Dist LAD-1 lesion is 50% stenosed.   Dist LAD-2 lesion is 70% stenosed.   The left ventricular ejection fraction is greater than 65% by visual estimate. Ao  = 132/81 (99) LV = 147/10 RA = 2 RV = 26/6 PA = 24/1 (14) PCW = 6 Fick cardiac output/index = 8.0/3.5 PVR =  1.0 WU Ao sat = 99% PA sat = 72%, 73% SVC sat = 76%   Assessment: 1. Mild to moderate non-obstructive CAD 2. EF 65-70% 3. Normal filling pressures 4. High cardiac output with no evidence of intracardiac shunting   Past Medical History:  Diagnosis Date   Allergic rhinitis due to pollen    on allergy shots   Arrhythmia    1980s hospitalized for HTN and ?irregular rhythm but no specific dx but resolved during hospitalization   Diabetes mellitus without complication (HCC)    HTN (hypertension)    Hyperlipidemia    Current Outpatient Medications  Medication Sig Dispense Refill   amLODipine (NORVASC) 10 MG tablet Take 1 tablet (10 mg total) by mouth daily. 30 tablet 0   ascorbic acid (VITAMIN C) 500 MG tablet Take 500 mg by mouth every morning.      aspirin 81 MG chewable tablet Chew 1 tablet (81 mg total) by mouth daily. 30 tablet 0   atorvastatin (LIPITOR) 80 MG tablet Take 80 mg by mouth at bedtime.     B Complex Vitamins (B COMPLEX 100 PO) Take 1 tablet by mouth every morning.      carvedilol (COREG) 6.25 MG tablet Take 1 tablet (6.25 mg total) by mouth 2 (two) times daily with a meal. 60 tablet 0   cetirizine (ZYRTEC) 10 MG tablet Take 10 mg by mouth in the morning.     Magnesium Oxide 200 MG TABS Take 1 tablet (  200 mg total) by mouth daily. (Patient not taking: Reported on 09/17/2021) 90 tablet 3   metFORMIN (GLUCOPHAGE) 1000 MG tablet Take 1,000 mg by mouth 2 (two) times daily.     pantoprazole (PROTONIX) 40 MG tablet Take 1 tablet (40 mg total) by mouth daily. (Patient not taking: Reported on 09/17/2021) 30 tablet 0   potassium chloride SA (KLOR-CON M20) 20 MEQ tablet Take 1 tablet (20 mEq total) by mouth daily. 90 tablet 3   sacubitril-valsartan (ENTRESTO) 24-26 MG Take 1 tablet by mouth 2 (two) times daily. 180 tablet 3   tamsulosin (FLOMAX) 0.4 MG CAPS capsule Take 1 capsule (0.4 mg total) by mouth daily. 30 capsule 11   No current facility-administered medications for this  visit.   Allergies  Allergen Reactions   Penicillins     Social History   Socioeconomic History   Marital status: Single    Spouse name: Not on file   Number of children: Not on file   Years of education: Not on file   Highest education level: Not on file  Occupational History   Occupation: school counselor  Tobacco Use   Smoking status: Never   Smokeless tobacco: Never  Vaping Use   Vaping Use: Never used  Substance and Sexual Activity   Alcohol use: No   Drug use: No   Sexual activity: Yes  Other Topics Concern   Not on file  Social History Narrative   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[place], [place]   Lives in [place] Determinants of Health   Financial Resource Strain: Not on file  Food Insecurity: Not on file  Transportation Needs: Not on file  Physical Activity: Not on file  Stress: Not on file  Social Connections: Not on file  Intimate Partner Violence: Not on file   Family History  Problem Relation Age of Onset   Cancer Mother    Cirrhosis Father        cirrhosis of the liver; alcohol related   Diabetes Sister    Other Other        neice was on allergy vaccine when young   Colon cancer Neg Hx    There were no vitals taken for this visit.  Wt Readings from Last 3 Encounters:  09/17/21 104.1 kg (229 lb 6.4 oz)  08/20/21 105.1 kg (231 lb 9.6 oz)  08/13/21 107.7 kg (237 lb 6.4 oz)   PHYSICAL EXAM: General:  NAD. No resp difficulty HEENT: Normal Neck: Supple. No JVD. Carotids 2+ bilat; no bruits. No lymphadenopathy or thryomegaly appreciated. Cor: PMI nondisplaced. Regular rate & rhythm. No rubs, gallops or murmurs. Lungs: Clear Abdomen: Obese, nontender, nondistended. No hepatosplenomegaly. No bruits or masses. Good bowel sounds. Extremities: No cyanosis, clubbing, rash, trace BLE edema, compression hose on Neuro: Alert & oriented x 3, cranial nerves grossly intact. Moves all 4 extremities w/o difficulty. Affect pleasant.  ECG:  NSR 71  bpm (personally reviewed).  ASSESSMENT & PLAN: 1. CAD - Cath 11/22 with moderate non-obstructive CAD  - No s/s angina  - Continue atorva + ASA.  2. Chronic HFpEF/LE edema - Echo 05/15/21 EF 65-70% RV normal - Cath 11/22 normal filling pressures. May have component of venous insufficiency. - NYHA I-II, volume looks good today. - Restart Entresto 24/26 mg bid. - Continue carvedilol 6.25 mg bid. - Consider SGLT2i at next visit. - Encouraged compression hose. - Consider PYP in future. - BMET/BNP and mag today; repeat  BMET in 10-14 days.  3.Syncope  - Had syncopal episode 05/14/21 . Unclear etiology.  - Evaluated in the E.D - Carotid Dopplers negative. Echo EF 65-70%  - Zio 11/22 No significant dysrhythmias.  - No driving x 6 months (~1/61) - No further events.  4. HTN  - BP elevated today. - Restart Entresto as above.  5.  Hyperlipidemia  - Continue statin.   6. DMII - On metformin - Considr SGLT2i next visit.   7. Lung nodule - Incidental finding on CT. - He has follow up with Pulmonary.  Follow up in 6 weeks with APP (BP check and +/- SGLT2i) and 12 weeks with Dr. Haroldine Laws.  Rafael Bihari, FNP  8:02 AM

## 2021-09-23 ENCOUNTER — Ambulatory Visit (HOSPITAL_COMMUNITY): Payer: BC Managed Care – PPO | Admitting: Physical Therapy

## 2021-09-23 ENCOUNTER — Other Ambulatory Visit: Payer: Self-pay

## 2021-09-23 ENCOUNTER — Encounter (HOSPITAL_COMMUNITY): Payer: Self-pay | Admitting: Physical Therapy

## 2021-09-23 DIAGNOSIS — R262 Difficulty in walking, not elsewhere classified: Secondary | ICD-10-CM | POA: Diagnosis not present

## 2021-09-23 DIAGNOSIS — M6281 Muscle weakness (generalized): Secondary | ICD-10-CM

## 2021-09-23 NOTE — Therapy (Signed)
[facility] ?[address] ?[place], [place], [address] ?Phone: [phone]   Fax:  [phone] ? ?Physical Therapy Treatment ? ?Patient Details  ?Name: Andrew Arnold ?MRN: [id number] ?Date of Birth: 14-Jul-1955 ?Referring Provider (PT): Lala Lund ? ? ?Encounter Date: 09/23/2021 ? ? PT End of Session - 09/23/21 1038   ? ? Visit Number 8   ? Number of Visits 12   ? Date for PT Re-Evaluation 10/02/21   ? Authorization Type BCBS   ? Authorization - Visit Number 8   ? Authorization - Number of Visits 30   ? Progress Note Due on Visit 10   ? PT Start Time 1033   ? PT Stop Time 1113   ? PT Time Calculation (min) 40 min   ? Activity Tolerance Patient tolerated treatment well   ? Behavior During Therapy Andrew Arnold for tasks assessed/performed   ? ?  ?  ? ?  ? ? ?Past Medical History:  ?Diagnosis Date  ? Allergic rhinitis due to pollen   ? on allergy shots  ? Arrhythmia   ? 1980s hospitalized for HTN and ?irregular rhythm but no specific dx but resolved during hospitalization  ? Diabetes mellitus without complication ([place])   ? HTN (hypertension)   ? Hyperlipidemia   ? ? ?Past Surgical History:  ?Procedure Laterality Date  ? ANTERIOR CERVICAL DECOMP/DISCECTOMY FUSION N/A 06/03/2021  ? Procedure: Anterior Cervical Decompression Fusion  Cervical three-four;  Surgeon: Vallarie Mare, MD;  Location: [facility];  Service: Neurosurgery;  Laterality: N/A;  ? colonoscopy  2005  ? Dr. Gala Romney: normal rectum, sigmoid diverticula in colonic mucosa  ? COLONOSCOPY N/A 02/15/2014  ? Procedure: COLONOSCOPY;  Surgeon: Daneil Dolin, MD;  [facility];  Service: Endoscopy;  Laterality: N/A;  2:00  ? RIGHT/LEFT HEART CATH AND CORONARY ANGIOGRAPHY N/A 05/23/2021  ? Procedure: RIGHT/LEFT HEART CATH AND CORONARY ANGIOGRAPHY;  Surgeon: Jolaine Artist, MD;  Location: [facility];  Service: Cardiovascular;  Laterality: N/A;  ? ? ?There were no vitals filed for this visit. ? ? Subjective  Assessment - 09/23/21 1036   ? ? Subjective Patient reports that he has been doing well with balance at home, but he is having difficulty with the strengthening exercises involving SLS.   ? Pertinent History ACDF, DM, HTN, lung nodular   ? Limitations Standing;Walking;House hold activities   ? How long can you sit comfortably? no problem   ? How long can you walk comfortably? ambulates with a walker the longest he has walked is to the mailbox and back .   ? Patient Stated Goals To be able to walk normal without any assistive device   ? Currently in Pain? No/denies   ? Pain Onset Yesterday   ? ?  ?  ? ?  ? ? ? ? ? ? ? ? ? ? ? ? ? ? ? ? ? ? ? ? [facility] Adult PT Treatment/Exercise - 09/23/21 0001   ? ?  ? Neuro Re-ed   ? Neuro Re-ed Details  NS on black foam w/ AP weight shift 3x20s, NS on foam w/ head turns 2x20s, vertical head motions 2x20s, cerivcal side bending 2x20s, slow march on black foam 4x95mn   ?  ? Knee/Hip Exercises: Aerobic  ? Recumbent Bike x541m   ?  ? Knee/Hip Exercises: Standing  ? Other Standing Knee Exercises 3 direction SL vectors from 4inch step 3x6 each   ? ?  ?  ? ?  ? ? ? ? ? ? ? ? ? ? ? ?  PT Short Term Goals - 09/18/21 0924   ? ?  ? PT SHORT TERM GOAL #1  ? Title PT to be I in HEP to increase mm strength by 1/2 grade to allow pt to feel confident walking inside with a cane.   ? Time 3   ? Period Weeks   ? Status On-going   ? Target Date 09/11/21   ?  ? PT SHORT TERM GOAL #2  ? Title PT to be able to stand on his Rt LE for 15 seconds to reduce risk of falling   ? Time 3   ? Period Weeks   ? Status On-going   ? ?  ?  ? ?  ? ? ? ? PT Long Term Goals - 09/18/21 0924   ? ?  ? PT LONG TERM GOAL #1  ? Title Pt to be completing an advance HEP to increase LE strength by one grade to allow pt to go up and down 8 steps in a reciprocal manner.   ? Time 6   ? Period Weeks   ? Status On-going   ? Target Date 10/02/21   ?  ? PT LONG TERM GOAL #2  ? Title PT to be able to balance on both legs for 20 seconds to  decrease risk of falling so pt feels confident walking outside with a cane.   ? Time 6   ? Period Weeks   ? Status On-going   ? ?  ?  ? ?  ? ? ? ? ? ? ? ? Plan - 09/23/21 1038   ? ? Clinical Impression Statement Patient tolerated treatment well during today's session and did not exhibit any signs of even moderate fatigue. SLS activities showed a deficit in the RLE with about every third stance through that limb. Head turning provided more of a challenge than purposeful weight shifting so the vestibular system integration is likely with deficit as well.   ? Personal Factors and Comorbidities Comorbidity 2;Age;Fitness;Time since onset of injury/illness/exacerbation   ? Comorbidities ACDF, DM, HTN   ? Examination-Activity Limitations Stairs;Transfers;Lift;Locomotion Level   ? Examination-Participation Restrictions Occupation;Meal Prep;Yard Work;Community Activity;Cleaning   ? Stability/Clinical Decision Making Stable/Uncomplicated   ? Rehab Potential Good   ? PT Frequency 2x / week   ? PT Duration 6 weeks   ? PT Treatment/Interventions Patient/family education;Therapeutic activities;Stair training;Balance training;Neuromuscular re-education;Therapeutic exercise   ? PT Next Visit Plan continue vector stances, add heel toe gt   ? PT Home Exercise Plan sitting ankle dorsi/plantarflexion, LAQ, bridge.; 2/23: heel raises, hip abd/ext; 2/27 - sidelying hip abd, clams, prone hip ext;3/7: heelraise, squat, step up , sit to stand   ? Consulted and Agree with Plan of Care Patient   ? ?  ?  ? ?  ? ? ?Patient will benefit from skilled therapeutic intervention in order to improve the following deficits and impairments:  Decreased strength, Difficulty walking, Decreased mobility, Decreased balance, Decreased range of motion, Decreased knowledge of use of DME, Decreased activity tolerance, Decreased endurance ? ?Visit Diagnosis: ?Muscle weakness (generalized) ? ?Difficulty in walking, not elsewhere classified ? ? ? ? ?Problem  List ?Patient Active Problem List  ? Diagnosis Date Noted  ? CAP (community acquired pneumonia) 08/01/2021  ? Nausea and vomiting 08/01/2021  ? DM2 (diabetes mellitus, type 2) ([facility]) 08/01/2021  ? Coronary artery disease involving native coronary artery of native heart without angina pectoris 07/31/2021  ? Stenosis of cervical spine with myelopathy ([facility]) 06/03/2021  ?  Macrocytic anemia 05/15/2021  ? Bilateral lower extremity edema 05/15/2021  ? BPH (benign prostatic hyperplasia) 05/15/2021  ? Elevated PSA 05/15/2021  ? Cervical spinal stenosis 05/15/2021  ? DDD (degenerative disc disease), cervical 05/15/2021  ? Syncope 05/14/2021  ? Elevated hemoglobin A1c 04/10/2021  ? Chest pain 08/29/2017  ? Fever 08/29/2017  ? Acute URI 08/29/2017  ? Moderate dehydration 08/29/2017  ? Sepsis ([facility]) 08/29/2017  ? Elevated lactic acid level 08/29/2017  ? Acute gastroenteritis 08/29/2017  ? Diarrhea 08/29/2017  ? GERD (gastroesophageal reflux disease) 08/29/2017  ? Positive D dimer 08/29/2017  ? Encounter for screening colonoscopy 02/01/2014  ? Orthostatic hypotension 01/15/2014  ? Dyslipidemia 08/14/2008  ? Essential hypertension 08/14/2008  ? Seasonal and perennial allergic rhinitis 04/23/2007  ? ? ?Adalberto Cole, PT ?09/23/2021, 12:46 PM ? ?[facility] ?[address] ?[place], [place], [address] ?Phone: [phone]   Fax:  [phone] ? ?Name: Andrew Arnold ?MRN: [id number] ?Date of Birth: 1955/09/09 ? ? ? ?

## 2021-09-24 ENCOUNTER — Other Ambulatory Visit (HOSPITAL_COMMUNITY): Payer: Self-pay

## 2021-09-24 ENCOUNTER — Ambulatory Visit (HOSPITAL_COMMUNITY)
Admission: RE | Admit: 2021-09-24 | Discharge: 2021-09-24 | Disposition: A | Discharge status: Home / Self Care | Payer: BC Managed Care – PPO | Source: Ambulatory Visit | Attending: Family Medicine | Admitting: Family Medicine

## 2021-09-24 ENCOUNTER — Encounter (HOSPITAL_COMMUNITY): Payer: Self-pay

## 2021-09-24 VITALS — BP 150/86 | HR 74 | Wt 227.4 lb

## 2021-09-24 DIAGNOSIS — E669 Obesity, unspecified: Secondary | ICD-10-CM | POA: Diagnosis not present

## 2021-09-24 DIAGNOSIS — Z6833 Body mass index (BMI) 33.0-33.9, adult: Secondary | ICD-10-CM | POA: Insufficient documentation

## 2021-09-24 DIAGNOSIS — R531 Weakness: Secondary | ICD-10-CM | POA: Diagnosis not present

## 2021-09-24 DIAGNOSIS — I110 Hypertensive heart disease with heart failure: Secondary | ICD-10-CM | POA: Diagnosis not present

## 2021-09-24 DIAGNOSIS — I2510 Atherosclerotic heart disease of native coronary artery without angina pectoris: Secondary | ICD-10-CM | POA: Diagnosis not present

## 2021-09-24 DIAGNOSIS — Z79899 Other long term (current) drug therapy: Secondary | ICD-10-CM | POA: Diagnosis not present

## 2021-09-24 DIAGNOSIS — Z8701 Personal history of pneumonia (recurrent): Secondary | ICD-10-CM | POA: Diagnosis not present

## 2021-09-24 DIAGNOSIS — Z7984 Long term (current) use of oral hypoglycemic drugs: Secondary | ICD-10-CM | POA: Insufficient documentation

## 2021-09-24 DIAGNOSIS — Z7982 Long term (current) use of aspirin: Secondary | ICD-10-CM | POA: Insufficient documentation

## 2021-09-24 DIAGNOSIS — R55 Syncope and collapse: Secondary | ICD-10-CM | POA: Diagnosis not present

## 2021-09-24 DIAGNOSIS — I10 Essential (primary) hypertension: Secondary | ICD-10-CM | POA: Diagnosis not present

## 2021-09-24 DIAGNOSIS — M4802 Spinal stenosis, cervical region: Secondary | ICD-10-CM | POA: Insufficient documentation

## 2021-09-24 DIAGNOSIS — E785 Hyperlipidemia, unspecified: Secondary | ICD-10-CM | POA: Insufficient documentation

## 2021-09-24 DIAGNOSIS — I5032 Chronic diastolic (congestive) heart failure: Secondary | ICD-10-CM

## 2021-09-24 DIAGNOSIS — R911 Solitary pulmonary nodule: Secondary | ICD-10-CM

## 2021-09-24 DIAGNOSIS — E119 Type 2 diabetes mellitus without complications: Secondary | ICD-10-CM | POA: Diagnosis not present

## 2021-09-24 DIAGNOSIS — E1100 Type 2 diabetes mellitus with hyperosmolarity without nonketotic hyperglycemic-hyperosmolar coma (NKHHC): Secondary | ICD-10-CM

## 2021-09-24 DIAGNOSIS — E7849 Other hyperlipidemia: Secondary | ICD-10-CM

## 2021-09-24 LAB — BASIC METABOLIC PANEL
Anion gap: 12 (ref 5–15)
BUN: 8 mg/dL (ref 8–23)
CO2: 26 mmol/L (ref 22–32)
Calcium: 9.3 mg/dL (ref 8.9–10.3)
Chloride: 102 mmol/L (ref 98–111)
Creatinine, Ser: 0.84 mg/dL (ref 0.61–1.24)
GFR, Estimated: >60 mL/min (ref >60)
Glucose, Bld: 107 mg/dL — ABNORMAL HIGH (ref 70–99)
Potassium: 3.5 mmol/L (ref 3.5–5.1)
Sodium: 140 mmol/L (ref 135–145)

## 2021-09-24 LAB — MAGNESIUM: Magnesium: 1.5 mg/dL — ABNORMAL LOW (ref 1.7–2.4)

## 2021-09-24 MED ORDER — DAPAGLIFLOZIN PROPANEDIOL 10 MG PO TABS: 10.0000 mg | ORAL_TABLET | Freq: Every day | ORAL | 3 refills | Status: DC

## 2021-09-24 NOTE — Patient Instructions (Signed)
START Farxiga 10 mg, one tab daily ? ?Labs today ?We will only contact you if something comes back abnormal or we need to make some changes. ?Otherwise no news is good news! ? ?Keep cardiology follow up as scheduled ? ?Do the following things EVERYDAY: ?Weigh yourself in the morning before breakfast. Write it down and keep it in a log. ?Take your medicines as prescribed ?Eat low salt foods--Limit salt (sodium) to 2000 mg per day.  ?Stay as active as you can everyday ?Limit all fluids for the day to less than 2 liters ? ? ?At the [facility], you and your health needs are our priority. As part of our continuing mission to provide you with exceptional heart care, we have created designated Provider Care Teams. These Care Teams include your primary Cardiologist (physician) and Advanced Practice Providers (APPs- Physician Assistants and Nurse Practitioners) who all work together to provide you with the care you need, when you need it.  ? ?You may see any of the following providers on your designated Care Team at your next follow up: ?Dr Glori Bickers ?Dr Loralie Champagne ?Darrick Grinder, NP ?Lyda Jester, PA ?Jessica Milford,NP ?Marlyce Huge, PA ?Audry Riles, PharmD ? ? ?Please be sure to bring in all your medications bottles to every appointment.  ? ?If you have any questions or concerns before your next appointment please send [place] a message through [place] or call our office at [phone].   ? ?TO LEAVE A MESSAGE FOR THE NURSE SELECT OPTION 2, PLEASE LEAVE A MESSAGE INCLUDING: ?YOUR NAME ?DATE OF BIRTH ?CALL BACK NUMBER ?REASON FOR CALL**this is important as we prioritize the call backs ? ?YOU WILL RECEIVE A CALL BACK THE SAME DAY AS LONG AS YOU CALL BEFORE 4:00 PM ? ?

## 2021-09-25 ENCOUNTER — Encounter (HOSPITAL_COMMUNITY): Payer: Self-pay | Admitting: Physical Therapy

## 2021-09-25 ENCOUNTER — Other Ambulatory Visit: Payer: Self-pay

## 2021-09-25 ENCOUNTER — Ambulatory Visit (HOSPITAL_COMMUNITY): Payer: BC Managed Care – PPO | Admitting: Physical Therapy

## 2021-09-25 DIAGNOSIS — M6281 Muscle weakness (generalized): Secondary | ICD-10-CM | POA: Diagnosis not present

## 2021-09-25 DIAGNOSIS — R262 Difficulty in walking, not elsewhere classified: Secondary | ICD-10-CM | POA: Diagnosis not present

## 2021-09-25 NOTE — Therapy (Signed)
[facility] ?[address] ?[place], [place], [address] ?Phone: [phone]   Fax:  [phone] ? ?Physical Therapy Treatment ? ?Patient Details  ?Name: Andrew Arnold ?MRN: [id number] ?Date of Birth: Mar 22, 1956 ?Referring Provider (PT): Lala Lund ? ? ?Encounter Date: 09/25/2021 ? ? PT End of Session - 09/25/21 1253   ? ? Visit Number 9   ? Number of Visits 12   ? Date for PT Re-Evaluation 10/02/21   ? Authorization Type BCBS   ? Authorization - Visit Number 9   ? Authorization - Number of Visits 30   ? Progress Note Due on Visit 10   ? PT Start Time 1300   ? PT Stop Time 0258   ? PT Time Calculation (min) 41 min   ? Activity Tolerance Patient tolerated treatment well   ? Behavior During Therapy [facility] for tasks assessed/performed   ? ?  ?  ? ?  ? ? ?Past Medical History:  ?Diagnosis Date  ? Allergic rhinitis due to pollen   ? on allergy shots  ? Arrhythmia   ? 1980s hospitalized for HTN and ?irregular rhythm but no specific dx but resolved during hospitalization  ? Diabetes mellitus without complication ([facility])   ? HTN (hypertension)   ? Hyperlipidemia   ? ? ?Past Surgical History:  ?Procedure Laterality Date  ? ANTERIOR CERVICAL DECOMP/DISCECTOMY FUSION N/A 06/03/2021  ? Procedure: Anterior Cervical Decompression Fusion  Cervical three-four;  Surgeon: Vallarie Mare, MD;  Location: [facility];  Service: Neurosurgery;  Laterality: N/A;  ? colonoscopy  2005  ? Dr. Gala Romney: normal rectum, sigmoid diverticula in colonic mucosa  ? COLONOSCOPY N/A 02/15/2014  ? Procedure: COLONOSCOPY;  Surgeon: Daneil Dolin, MD;  [facility];  Service: Endoscopy;  Laterality: N/A;  2:00  ? RIGHT/LEFT HEART CATH AND CORONARY ANGIOGRAPHY N/A 05/23/2021  ? Procedure: RIGHT/LEFT HEART CATH AND CORONARY ANGIOGRAPHY;  Surgeon: Jolaine Artist, MD;  Location: [facility];  Service: Cardiovascular;  Laterality: N/A;  ? ? ?There were no vitals filed for this visit. ? ? Subjective  Assessment - 09/25/21 1303   ? ? Subjective Patient reports that things at home are getting easier, but his main complaint is the lack of strength in his legs. He states that he has issues getting up out of a squat whenever he is getting something out of the bottom of his refrigerator.   ? Pertinent History ACDF, DM, HTN, lung nodular   ? Limitations Standing;Walking;House hold activities   ? How long can you sit comfortably? no problem   ? How long can you walk comfortably? ambulates with a walker the longest he has walked is to the mailbox and back .   ? Patient Stated Goals To be able to walk normal without any assistive device   ? Currently in Pain? No/denies   ? Pain Onset Yesterday   ? ?  ?  ? ?  ? ? ? ? ? ? ? ? ? ? ? ? ? ? ? ? ? ? ? ? [facility] Adult PT Treatment/Exercise - 09/25/21 0001   ? ?  ? Knee/Hip Exercises: Aerobic  ? Recumbent Bike x99mn   ?  ? Knee/Hip Exercises: Standing  ? Other Standing Knee Exercises Bent over glute punches #3 3x10 Rt hip and Cable resisted knee drives #2 3x8 Rt hip   ?  ? Knee/Hip Exercises: Sidelying  ? Hip ABduction Other (comment)   Hip abd arcs  over cone 4x8, hip abd w/ assisted concentric 4x6  ? ?  ?  ? ?  ? ? ? ? ? ? ? ? ? ? PT Education - 09/25/21 1342   ? ? Education Details HEP   ? Person(s) Educated Patient   ? Methods Explanation;Handout;Demonstration   ? Comprehension Verbalized understanding;Returned demonstration   ? ?  ?  ? ?  ? ? ? PT Short Term Goals - 09/18/21 0924   ? ?  ? PT SHORT TERM GOAL #1  ? Title PT to be I in HEP to increase mm strength by 1/2 grade to allow pt to feel confident walking inside with a cane.   ? Time 3   ? Period Weeks   ? Status On-going   ? Target Date 09/11/21   ?  ? PT SHORT TERM GOAL #2  ? Title PT to be able to stand on his Rt LE for 15 seconds to reduce risk of falling   ? Time 3   ? Period Weeks   ? Status On-going   ? ?  ?  ? ?  ? ? ? ? PT Long Term Goals - 09/18/21 0924   ? ?  ? PT LONG TERM GOAL #1  ? Title Pt to be completing an  advance HEP to increase LE strength by one grade to allow pt to go up and down 8 steps in a reciprocal manner.   ? Time 6   ? Period Weeks   ? Status On-going   ? Target Date 10/02/21   ?  ? PT LONG TERM GOAL #2  ? Title PT to be able to balance on both legs for 20 seconds to decrease risk of falling so pt feels confident walking outside with a cane.   ? Time 6   ? Period Weeks   ? Status On-going   ? ?  ?  ? ?  ? ? ? ? ? ? ? ? Plan - 09/25/21 1254   ? ? Clinical Impression Statement Patient tolerated treatment well during today's session, but he struggled severely with the sidelying hip abduction based exercises. He did have a tendency to allow the hips to rock back as well as uncontrolled eccentric motion. Exercises in standing were performed with much better form as well as resistance. Compensated Trendelenburg gait for the Rt hip was still noticed and actually increased following Rt hip strengthening exercises.   ? Personal Factors and Comorbidities Comorbidity 2;Age;Fitness;Time since onset of injury/illness/exacerbation   ? Comorbidities ACDF, DM, HTN   ? Examination-Activity Limitations Stairs;Transfers;Lift;Locomotion Level   ? Examination-Participation Restrictions Occupation;Meal Prep;Yard Work;Community Activity;Cleaning   ? Stability/Clinical Decision Making Stable/Uncomplicated   ? Rehab Potential Good   ? PT Frequency 2x / week   ? PT Duration 6 weeks   ? PT Treatment/Interventions Patient/family education;Therapeutic activities;Stair training;Balance training;Neuromuscular re-education;Therapeutic exercise   ? PT Next Visit Plan Perform progress note during next session. Add lateral hip strengthening exercises for the Rt hip, continue vector stances, and add heel toe gt   ? PT Home Exercise Plan sitting ankle dorsi/plantarflexion, LAQ, bridge.; 2/23: heel raises, hip abd/ext; 2/27 - sidelying hip abd, clams, prone hip ext;3/7: heelraise, squat, step up , sit to stand, and Sidelying Hip Abduction - 2-3  x daily - 7 x weekly - 3 sets - 6 reps   ? Consulted and Agree with Plan of Care Patient   ? ?  ?  ? ?  ? ? ?  Patient will benefit from skilled therapeutic intervention in order to improve the following deficits and impairments:  Decreased strength, Difficulty walking, Decreased mobility, Decreased balance, Decreased range of motion, Decreased knowledge of use of DME, Decreased activity tolerance, Decreased endurance ? ?Visit Diagnosis: ?Muscle weakness (generalized) ? ?Difficulty in walking, not elsewhere classified ? ? ? ? ?Problem List ?Patient Active Problem List  ? Diagnosis Date Noted  ? CAP (community acquired pneumonia) 08/01/2021  ? Nausea and vomiting 08/01/2021  ? DM2 (diabetes mellitus, type 2) ([facility]) 08/01/2021  ? Coronary artery disease involving native coronary artery of native heart without angina pectoris 07/31/2021  ? Stenosis of cervical spine with myelopathy ([facility]) 06/03/2021  ? Macrocytic anemia 05/15/2021  ? Bilateral lower extremity edema 05/15/2021  ? BPH (benign prostatic hyperplasia) 05/15/2021  ? Elevated PSA 05/15/2021  ? Cervical spinal stenosis 05/15/2021  ? DDD (degenerative disc disease), cervical 05/15/2021  ? Syncope 05/14/2021  ? Elevated hemoglobin A1c 04/10/2021  ? Chest pain 08/29/2017  ? Fever 08/29/2017  ? Acute URI 08/29/2017  ? Moderate dehydration 08/29/2017  ? Sepsis ([place]) 08/29/2017  ? Elevated lactic acid level 08/29/2017  ? Acute gastroenteritis 08/29/2017  ? Diarrhea 08/29/2017  ? GERD (gastroesophageal reflux disease) 08/29/2017  ? Positive D dimer 08/29/2017  ? Encounter for screening colonoscopy 02/01/2014  ? Orthostatic hypotension 01/15/2014  ? Dyslipidemia 08/14/2008  ? Essential hypertension 08/14/2008  ? Seasonal and perennial allergic rhinitis 04/23/2007  ? ? ?Adalberto Cole, PT ?09/25/2021, 3:22 PM ? ?[facility] ?[address] ?[place], [place], [address] ?Phone: [phone]   Fax:  [phone] ? ?Name: Andrew Arnold ?MRN: [id number] ?Date of Birth: 07/04/1956 ? ? ? ?

## 2021-09-26 DIAGNOSIS — Z6833 Body mass index (BMI) 33.0-33.9, adult: Secondary | ICD-10-CM | POA: Diagnosis not present

## 2021-09-26 DIAGNOSIS — M4712 Other spondylosis with myelopathy, cervical region: Secondary | ICD-10-CM | POA: Diagnosis not present

## 2021-09-26 DIAGNOSIS — I10 Essential (primary) hypertension: Secondary | ICD-10-CM | POA: Diagnosis not present

## 2021-09-30 ENCOUNTER — Ambulatory Visit (HOSPITAL_COMMUNITY): Payer: BC Managed Care – PPO | Admitting: Physical Therapy

## 2021-09-30 ENCOUNTER — Other Ambulatory Visit: Payer: Self-pay

## 2021-09-30 DIAGNOSIS — R262 Difficulty in walking, not elsewhere classified: Secondary | ICD-10-CM | POA: Diagnosis not present

## 2021-09-30 DIAGNOSIS — M6281 Muscle weakness (generalized): Secondary | ICD-10-CM | POA: Diagnosis not present

## 2021-09-30 NOTE — Addendum Note (Signed)
Addended by: Leeroy Cha on: 09/30/2021 12:08 PM ? ? Modules accepted: Orders ? ?

## 2021-09-30 NOTE — Therapy (Addendum)
[facility] ?[address] ?[place], [place], [address] ?Phone: [phone]   Fax:  [phone] ? ?Physical Therapy Treatment ?Progress Note ?Reporting Period 08/21/21 to 09/30/21 ? ?See note below for Objective Data and Assessment of Progress/Goals.  ? ?  ?Patient Details  ?Name: Andrew Arnold ?MRN: [id number] ?Date of Birth: May 06, 1956 ?Referring Provider (PT): Lala Lund ? ? ?Encounter Date: 09/30/2021 ? ? PT End of Session - 09/30/21 1007   ? ? Visit Number 10   ? Number of Visits 20   ? Date for PT Re-Evaluation 10/30/21   ? Authorization Type BCBS   ? Authorization - Visit Number 10   ? Authorization - Number of Visits 30   ? Progress Note Due on Visit 20   ? PT Start Time 1002   ? PT Stop Time 1044   ? PT Time Calculation (min) 42 min   ? Activity Tolerance Patient tolerated treatment well   ? Behavior During Therapy [facility] for tasks assessed/performed   ? ?  ?  ? ?  ? ? ?Past Medical History:  ?Diagnosis Date  ? Allergic rhinitis due to pollen   ? on allergy shots  ? Arrhythmia   ? 1980s hospitalized for HTN and ?irregular rhythm but no specific dx but resolved during hospitalization  ? Diabetes mellitus without complication ([place])   ? HTN (hypertension)   ? Hyperlipidemia   ? ? ?Past Surgical History:  ?Procedure Laterality Date  ? ANTERIOR CERVICAL DECOMP/DISCECTOMY FUSION N/A 06/03/2021  ? Procedure: Anterior Cervical Decompression Fusion  Cervical three-four;  Surgeon: Vallarie Mare, MD;  Location: [place];  Service: Neurosurgery;  Laterality: N/A;  ? colonoscopy  2005  ? Dr. Gala Romney: normal rectum, sigmoid diverticula in colonic mucosa  ? COLONOSCOPY N/A 02/15/2014  ? Procedure: COLONOSCOPY;  Surgeon: Daneil Dolin, MD;  [facility];  Service: Endoscopy;  Laterality: N/A;  2:00  ? RIGHT/LEFT HEART CATH AND CORONARY ANGIOGRAPHY N/A 05/23/2021  ? Procedure: RIGHT/LEFT HEART CATH AND CORONARY ANGIOGRAPHY;  Surgeon: Jolaine Artist, MD;   Location: [facility];  Service: Cardiovascular;  Laterality: N/A;  ? ? ?There were no vitals filed for this visit. ? ? Subjective Assessment - 09/30/21 1047   ? ? Subjective pt feels likes he's stronger but still not where he wants to be.  State he would like to be able to walk without an AD; ;currently using SPC.   ? Currently in Pain? No/denies   ? ?  ?  ? ?  ? ? ? ? ? [facility] PT Assessment - 09/30/21 1008   ? ?  ? Assessment  ? Medical Diagnosis weakness/ difficulty walking   ? Referring Provider (PT) Lala Lund   ? Onset Date/Surgical Date 06/03/21   ? Prior Therapy OP in october   ?  ? Prior Function  ? Level of Independence Independent   ? Vocation Full time employment   ? Vocation Requirements teacher   ? Leisure cater   ?  ? Cognition  ? Overall Cognitive Status Within Functional Limits for tasks assessed   ?  ? Functional Tests  ? Functional tests Single leg stance;Sit to Stand   ?  ? Single Leg Stance  ? Comments Rt : 4"; Lt 30"   was Rt 1" max and Lt 20" max  ?  ? Sit to Stand  ? Comments 10 in 30 seconds   was able to do 7 STS in  30 seconds  ?  ? Strength  ? Right Hip Flexion 3-/5   was 2+/5  ? Right Hip Extension 2/5   was 2/5  ? Right Hip ABduction 3-/5   was 2/5  ? Left Hip Flexion 4+/5   was 4-/5  ? Left Hip Extension 3-/5   was 3-/5  ? Left Hip ABduction 3+/5   was 3/5  ? Right Knee Flexion 2/5   tends to turn LE out and use adductors with SLR ; was 2/5  ? Right Knee Extension 3+/5   was 3/5  ? Left Knee Flexion 3+/5   was 3+/5  ? Left Knee Extension 4+/5   was 5/5  ? Right Ankle Dorsiflexion 3/5   was 3-/5  ? Right Ankle Plantar Flexion 2/5   was 2/5  ? Left Ankle Dorsiflexion 3+/5   was 3-/5  ? Left Ankle Plantar Flexion 4-/5   was 3/5  ? ?  ?  ? ?  ? ? ? ? ? ? ? ? ? ? ? ? ? ? ? ? [facility] Adult PT Treatment/Exercise - 09/30/21 0001   ? ?  ? Knee/Hip Exercises: Standing  ? Heel Raises 20 reps   ? Knee Flexion Both;15 reps   ? Functional Squat 15 reps   ? SLS with Vectors 5x B with 5" hold  with 1 HHA   ?  ? Knee/Hip Exercises: Seated  ? Sit to Sand 10 reps;without UE support   ? ?  ?  ? ?  ? ? ? ? ? ? ? ? ? ? PT Education - 09/30/21 1042   ? ? Education Details complete HEP daily for best results, increase overall activity at home.  Informed of weak mm groups as tested with MMT.   ? Person(s) Educated Patient   ? Methods Explanation   ? Comprehension Verbalized understanding   ? ?  ?  ? ?  ? ? ? PT Short Term Goals - 09/30/21 1029   ? ?  ? PT SHORT TERM GOAL #1  ? Title PT to be I in HEP to increase mm strength by 1/2 grade to allow pt to feel confident walking inside with a cane.   ? Time 3   ? Period Weeks   ? Status Partially Met   ? Target Date 09/11/21   ?  ? PT SHORT TERM GOAL #2  ? Title PT to be able to stand on his Rt LE for 15 seconds to reduce risk of falling   ? Time 3   ? Period Weeks   ? Status On-going   ? ?  ?  ? ?  ? ? ? ? PT Long Term Goals - 09/30/21 1031   ? ?  ? PT LONG TERM GOAL #1  ? Title Pt to be completing an advance HEP to increase LE strength by one grade to allow pt to go up and down 8 steps in a reciprocal manner.   ? Time 6   ? Period Weeks   ? Status On-going   ? Target Date 10/02/21   ?  ? PT LONG TERM GOAL #2  ? Title PT to be able to balance on both legs for 20 seconds to decrease risk of falling so pt feels confident walking outside with a cane.   ? Time 6   ? Period Weeks   ? Status On-going   ? ?  ?  ? ?  ? ? ? ? ? ? ? ?  Plan - 09/30/21 1038   ? ? Clinical Impression Statement Progress note completed this session.  Manual muscle testing reveals ? grade increase in all mm groups with exception of bilateral knee flexion, hip extension and plantar flexion.  Pt has partly met one STG and none of his LTG?s at this time.  Pt is now walking with his cane.  Admits to not doing his HEP daily, however does complete therex approx. 3-4X week.   Pt has made good progress, however has not met his goals.  Pt will benefit from continued skilled physical therapy to improve  strength and functional independence.   ? Personal Factors and Comorbidities Comorbidity 2;Age;Fitness;Time since onset of injury/illness/exacerbation   ? Comorbidities ACDF, DM, HTN   ? Examination-Activity Limitations Stairs;Transfers;Lift;Locomotion Level   ? Examination-Participation Restrictions Occupation;Meal Prep;Yard Work;Community Activity;Cleaning   ? Stability/Clinical Decision Making Stable/Uncomplicated   ? Rehab Potential Good   ? PT Frequency 2x / week   ? PT Duration 6 weeks   ? PT Treatment/Interventions Patient/family education;Therapeutic activities;Stair training;Balance training;Neuromuscular re-education;Therapeutic exercise   ? PT Next Visit Plan continue with skille therapy for 4 more weeks (8 more visits) to progress towards goals.   Add lateral hip strengthening exercises for the Rt hip, continue vector stances, and add heel toe gt.  Target weak Rt LE and bil hip extensors, Dorsi/plantar flexors and hamstrings   ? PT Home Exercise Plan sitting ankle dorsi/plantarflexion, LAQ, bridge.; 2/23: heel raises, hip abd/ext; 2/27 - sidelying hip abd, clams, prone hip ext;3/7: heelraise, squat, step up , sit to stand, and Sidelying Hip Abduction - 2-3 x daily - 7 x weekly - 3 sets - 6 reps   ? Consulted and Agree with Plan of Care Patient   ? ?  ?  ? ?  ? ? ?Patient will benefit from skilled therapeutic intervention in order to improve the following deficits and impairments:  Decreased strength, Difficulty walking, Decreased mobility, Decreased balance, Decreased range of motion, Decreased knowledge of use of DME, Decreased activity tolerance, Decreased endurance ? ?Visit Diagnosis: ?Muscle weakness (generalized) ? ?Difficulty in walking, not elsewhere classified ? ? ? ? ?Problem List ?Patient Active Problem List  ? Diagnosis Date Noted  ? CAP (community acquired pneumonia) 08/01/2021  ? Nausea and vomiting 08/01/2021  ? DM2 (diabetes mellitus, type 2) ([place]) 08/01/2021  ? Coronary artery disease  involving native coronary artery of native heart without angina pectoris 07/31/2021  ? Stenosis of cervical spine with myelopathy ([place]) 06/03/2021  ? Macrocytic anemia 05/15/2021  ? Bilateral lower extremity edema 11/03/

## 2021-10-01 ENCOUNTER — Telehealth (HOSPITAL_COMMUNITY): Payer: Self-pay | Admitting: Cardiology

## 2021-10-01 MED ORDER — MAGNESIUM OXIDE -MG SUPPLEMENT 200 MG PO TABS: 200.0000 mg | ORAL_TABLET | Freq: Two times a day (BID) | ORAL | 11 refills | Status: DC

## 2021-10-01 NOTE — Telephone Encounter (Signed)
-----   Message from Rafael Bihari, [place] sent at 09/24/2021  4:37 PM EDT ----- ?K is borderline normal and magnesium remains low, is he taking his Mag suppl as directed? If so, increase MagOx to 200 bid. ?

## 2021-10-01 NOTE — Telephone Encounter (Signed)
Patient called.  Patient aware. Reports he has been taking 200 mg daily advised to increase to 200 BID  ?

## 2021-10-02 ENCOUNTER — Other Ambulatory Visit: Payer: Self-pay

## 2021-10-02 ENCOUNTER — Ambulatory Visit (HOSPITAL_COMMUNITY): Payer: BC Managed Care – PPO | Admitting: Physical Therapy

## 2021-10-02 DIAGNOSIS — R262 Difficulty in walking, not elsewhere classified: Secondary | ICD-10-CM

## 2021-10-02 DIAGNOSIS — M6281 Muscle weakness (generalized): Secondary | ICD-10-CM

## 2021-10-02 NOTE — Therapy (Signed)
[facility] ?[facility] ?[address] ?[place], [place], [address] ?Phone: [phone]   Fax:  [phone] ? ?Physical Therapy Treatment ? ?Patient Details  ?Name: Andrew Arnold ?MRN: [id number] ?Date of Birth: March 09, 1956 ?Referring Provider (PT): Lala Lund ? ? ?Encounter Date: 10/02/2021 ? ? PT End of Session - 10/02/21 1013   ? ? Visit Number 11   ? Number of Visits 20   ? Date for PT Re-Evaluation 10/30/21   ? Authorization Type BCBS   ? Authorization - Visit Number 11   ? Authorization - Number of Visits 30   ? Progress Note Due on Visit 20   ? PT Start Time 1008   ? PT Stop Time 1046   ? PT Time Calculation (min) 38 min   ? Activity Tolerance Patient tolerated treatment well   ? Behavior During Therapy [facility] for tasks assessed/performed   ? ?  ?  ? ?  ? ? ?Past Medical History:  ?Diagnosis Date  ? Allergic rhinitis due to pollen   ? on allergy shots  ? Arrhythmia   ? 1980s hospitalized for HTN and ?irregular rhythm but no specific dx but resolved during hospitalization  ? Diabetes mellitus without complication ([place])   ? HTN (hypertension)   ? Hyperlipidemia   ? ? ?Past Surgical History:  ?Procedure Laterality Date  ? ANTERIOR CERVICAL DECOMP/DISCECTOMY FUSION N/A 06/03/2021  ? Procedure: Anterior Cervical Decompression Fusion  Cervical three-four;  Surgeon: Vallarie Mare, MD;  Location: [facility];  Service: Neurosurgery;  Laterality: N/A;  ? colonoscopy  2005  ? Dr. Gala Romney: normal rectum, sigmoid diverticula in colonic mucosa  ? COLONOSCOPY N/A 02/15/2014  ? Procedure: COLONOSCOPY;  Surgeon: Daneil Dolin, MD;  [facility];  Service: Endoscopy;  Laterality: N/A;  2:00  ? RIGHT/LEFT HEART CATH AND CORONARY ANGIOGRAPHY N/A 05/23/2021  ? Procedure: RIGHT/LEFT HEART CATH AND CORONARY ANGIOGRAPHY;  Surgeon: Jolaine Artist, MD;  Location: [facility];  Service: Cardiovascular;  Laterality: N/A;  ? ? ?There were no vitals filed for this  visit. ? ? ? ? ? ? ? ? ? ? ? ? ? ? ? ? ? ? ? ? ? [facility] Adult PT Treatment/Exercise - 10/02/21 0001   ? ?  ? Knee/Hip Exercises: Standing  ? Heel Raises 20 reps   ? Heel Raises Limitations incline slope, toeraises incline; postural cues   ? Knee Flexion Both;20 reps   ? Forward Lunges Both;20 reps   ? Forward Lunges Limitations 4" step no UE   ? Hip Abduction Both;2 sets;10 reps   ? Hip Extension Both;2 sets;10 reps   ? Functional Squat 20 reps   ? SLS with Vectors 5x B with 5" hold with 1 HHA   ? Other Standing Knee Exercises tandem stance 30" x 2 each LE lead no UE   ?  ? Knee/Hip Exercises: Seated  ? Sit to Sand 10 reps;without UE support   ? ?  ?  ? ?  ? ? ? ? ? ? ? ? ? ? ? ? PT Short Term Goals - 09/30/21 1029   ? ?  ? PT SHORT TERM GOAL #1  ? Title PT to be I in HEP to increase mm strength by 1/2 grade to allow pt to feel confident walking inside with a cane.   ? Time 3   ? Period Weeks   ? Status Partially Met   ? Target Date 09/11/21   ?  ?  PT SHORT TERM GOAL #2  ? Title PT to be able to stand on his Rt LE for 15 seconds to reduce risk of falling   ? Time 3   ? Period Weeks   ? Status On-going   ? ?  ?  ? ?  ? ? ? ? PT Long Term Goals - 09/30/21 1031   ? ?  ? PT LONG TERM GOAL #1  ? Title Pt to be completing an advance HEP to increase LE strength by one grade to allow pt to go up and down 8 steps in a reciprocal manner.   ? Time 6   ? Period Weeks   ? Status On-going   ? Target Date 10/02/21   ?  ? PT LONG TERM GOAL #2  ? Title PT to be able to balance on both legs for 20 seconds to decrease risk of falling so pt feels confident walking outside with a cane.   ? Time 6   ? Period Weeks   ? Status On-going   ? ?  ?  ? ?  ? ? ? ? ? ? ? ? Plan - 10/02/21 1104   ? ? Clinical Impression Statement Pt reports compliance with HEP and hopes to be off his cane when his therapy is completed.  Focused session on improving strength and stability of LE.  Pt  required only on e seated rest break this session prior to  beginning sit to stands at end of session.  Pt needed frequent cues during session to complete actviities in proper form.  Pt without any complaints or issues during session today.   ? Personal Factors and Comorbidities Comorbidity 2;Age;Fitness;Time since onset of injury/illness/exacerbation   ? Comorbidities ACDF, DM, HTN   ? Examination-Activity Limitations Stairs;Transfers;Lift;Locomotion Level   ? Examination-Participation Restrictions Occupation;Meal Prep;Yard Work;Community Activity;Cleaning   ? Stability/Clinical Decision Making Stable/Uncomplicated   ? Rehab Potential Good   ? PT Frequency 2x / week   ? PT Duration 6 weeks   ? PT Treatment/Interventions Patient/family education;Therapeutic activities;Stair training;Balance training;Neuromuscular re-education;Therapeutic exercise   ? PT Next Visit Plan continue with skilled therapy.  Progress lateral hip strengthening exercises for the Rt hip, continue vector stances, and add heel toe gt.  Target weak Rt LE and bil hip extensors, Dorsi/plantar flexors and hamstrings.  Begin cable resisted ambulation next session.   ? PT Home Exercise Plan sitting ankle dorsi/plantarflexion, LAQ, bridge.; 2/23: heel raises, hip abd/ext; 2/27 - sidelying hip abd, clams, prone hip ext;3/7: heelraise, squat, step up , sit to stand, and Sidelying Hip Abduction - 2-3 x daily - 7 x weekly - 3 sets - 6 reps   ? Consulted and Agree with Plan of Care Patient   ? ?  ?  ? ?  ? ? ?Patient will benefit from skilled therapeutic intervention in order to improve the following deficits and impairments:  Decreased strength, Difficulty walking, Decreased mobility, Decreased balance, Decreased range of motion, Decreased knowledge of use of DME, Decreased activity tolerance, Decreased endurance ? ?Visit Diagnosis: ?Muscle weakness (generalized) ? ?Difficulty in walking, not elsewhere classified ? ? ? ? ?Problem List ?Patient Active Problem List  ? Diagnosis Date Noted  ? CAP (community acquired  pneumonia) 08/01/2021  ? Nausea and vomiting 08/01/2021  ? DM2 (diabetes mellitus, type 2) ([place]) 08/01/2021  ? Coronary artery disease involving native coronary artery of native heart without angina pectoris 07/31/2021  ? Stenosis of cervical spine with myelopathy ([place]) 06/03/2021  ?  Macrocytic anemia 05/15/2021  ? Bilateral lower extremity edema 05/15/2021  ? BPH (benign prostatic hyperplasia) 05/15/2021  ? Elevated PSA 05/15/2021  ? Cervical spinal stenosis 05/15/2021  ? DDD (degenerative disc disease), cervical 05/15/2021  ? Syncope 05/14/2021  ? Elevated hemoglobin A1c 04/10/2021  ? Chest pain 08/29/2017  ? Fever 08/29/2017  ? Acute URI 08/29/2017  ? Moderate dehydration 08/29/2017  ? Sepsis ([place]) 08/29/2017  ? Elevated lactic acid level 08/29/2017  ? Acute gastroenteritis 08/29/2017  ? Diarrhea 08/29/2017  ? GERD (gastroesophageal reflux disease) 08/29/2017  ? Positive D dimer 08/29/2017  ? Encounter for screening colonoscopy 02/01/2014  ? Orthostatic hypotension 01/15/2014  ? Dyslipidemia 08/14/2008  ? Essential hypertension 08/14/2008  ? Seasonal and perennial allergic rhinitis 04/23/2007  ? ?Beatrice Sehgal Sula Soda, PTA/CLT, WTA ?[phone] ? ?Roseanne Reno B, PTA ?10/02/2021, 11:07 AM ? ?[facility] ?[address] ?[place], [place], [address] ?Phone: [phone]   Fax:  [phone] ? ?Name: ABDULRAHEEM PINEO ?MRN: [id number] ?Date of Birth: 04/30/1956 ? ? ? ?

## 2021-10-07 ENCOUNTER — Ambulatory Visit (HOSPITAL_COMMUNITY): Payer: BC Managed Care – PPO | Admitting: Physical Therapy

## 2021-10-07 ENCOUNTER — Encounter (HOSPITAL_COMMUNITY): Payer: Self-pay | Admitting: Physical Therapy

## 2021-10-07 ENCOUNTER — Other Ambulatory Visit: Payer: Self-pay

## 2021-10-07 DIAGNOSIS — E782 Mixed hyperlipidemia: Secondary | ICD-10-CM | POA: Diagnosis not present

## 2021-10-07 DIAGNOSIS — R262 Difficulty in walking, not elsewhere classified: Secondary | ICD-10-CM | POA: Diagnosis not present

## 2021-10-07 DIAGNOSIS — Z79899 Other long term (current) drug therapy: Secondary | ICD-10-CM | POA: Diagnosis not present

## 2021-10-07 DIAGNOSIS — E1165 Type 2 diabetes mellitus with hyperglycemia: Secondary | ICD-10-CM | POA: Diagnosis not present

## 2021-10-07 DIAGNOSIS — M6281 Muscle weakness (generalized): Secondary | ICD-10-CM

## 2021-10-07 DIAGNOSIS — I10 Essential (primary) hypertension: Secondary | ICD-10-CM | POA: Diagnosis not present

## 2021-10-07 DIAGNOSIS — E119 Type 2 diabetes mellitus without complications: Secondary | ICD-10-CM | POA: Diagnosis not present

## 2021-10-07 DIAGNOSIS — N401 Enlarged prostate with lower urinary tract symptoms: Secondary | ICD-10-CM | POA: Diagnosis not present

## 2021-10-07 NOTE — Therapy (Signed)
?[facility] ?[address] ?[place], [place], [address] ?Phone: [phone]   Fax:  [phone] ? ?Physical Therapy Treatment ? ?Patient Details  ?Name: Andrew Arnold ?MRN: [id number] ?Date of Birth: Apr 08, 1956 ?Referring Provider (PT): Lala Lund ? ? ?Encounter Date: 10/07/2021 ? ? PT End of Session - 10/07/21 0910   ? ? Visit Number 12   ? Number of Visits 20   ? Date for PT Re-Evaluation 10/30/21   ? Authorization Type BCBS   ? Authorization - Visit Number 12   ? Authorization - Number of Visits 30   ? Progress Note Due on Visit 20   ? PT Start Time [phone]   ? PT Stop Time 9528   ? PT Time Calculation (min) 42 min   ? Activity Tolerance Patient tolerated treatment well   ? Behavior During Therapy [facility] for tasks assessed/performed   ? ?  ?  ? ?  ? ? ?Past Medical History:  ?Diagnosis Date  ? Allergic rhinitis due to pollen   ? on allergy shots  ? Arrhythmia   ? 1980s hospitalized for HTN and ?irregular rhythm but no specific dx but resolved during hospitalization  ? Diabetes mellitus without complication ([place])   ? HTN (hypertension)   ? Hyperlipidemia   ? ? ?Past Surgical History:  ?Procedure Laterality Date  ? ANTERIOR CERVICAL DECOMP/DISCECTOMY FUSION N/A 06/03/2021  ? Procedure: Anterior Cervical Decompression Fusion  Cervical three-four;  Surgeon: Vallarie Mare, MD;  Location: [facility];  Service: Neurosurgery;  Laterality: N/A;  ? colonoscopy  2005  ? Dr. Gala Romney: normal rectum, sigmoid diverticula in colonic mucosa  ? COLONOSCOPY N/A 02/15/2014  ? Procedure: COLONOSCOPY;  Surgeon: Daneil Dolin, MD;  [facility];  Service: Endoscopy;  Laterality: N/A;  2:00  ? RIGHT/LEFT HEART CATH AND CORONARY ANGIOGRAPHY N/A 05/23/2021  ? Procedure: RIGHT/LEFT HEART CATH AND CORONARY ANGIOGRAPHY;  Surgeon: Jolaine Artist, MD;  Location: [facility];  Service: Cardiovascular;  Laterality: N/A;  ? ? ?There were no vitals filed for this visit. ? ? Subjective  Assessment - 10/07/21 1233   ? ? Subjective Patient reports that he does feel more off balance this morning, but he has noticed that his balance abilities tend to fluctuate depending on the day.   ? Currently in Pain? No/denies   ? ?  ?  ? ?  ? ? ? ? ? ? ? ? ? ? ? ? ? ? ? ? ? ? ? ? [facility] Adult PT Treatment/Exercise - 10/07/21 0001   ? ?  ? Neuro Re-ed   ? Neuro Re-ed Details  Side ways Cable walking #4 1x4RT each and w/ 3lbs AWs 1x4RT each, and side ways Cable walking arcs #4 1x4RT w/ 3lbs AWs   ?  ? Knee/Hip Exercises: Aerobic  ? Nustep x10.59mn(2 laps)   ? ?  ?  ? ?  ? ? ? ? ? ? ? ? ? ? ? ? PT Short Term Goals - 09/30/21 1029   ? ?  ? PT SHORT TERM GOAL #1  ? Title PT to be I in HEP to increase mm strength by 1/2 grade to allow pt to feel confident walking inside with a cane.   ? Time 3   ? Period Weeks   ? Status Partially Met   ? Target Date 09/11/21   ?  ? PT SHORT TERM GOAL #2  ? Title PT to be able  to stand on his Rt LE for 15 seconds to reduce risk of falling   ? Time 3   ? Period Weeks   ? Status On-going   ? ?  ?  ? ?  ? ? ? ? PT Long Term Goals - 09/30/21 1031   ? ?  ? PT LONG TERM GOAL #1  ? Title Pt to be completing an advance HEP to increase LE strength by one grade to allow pt to go up and down 8 steps in a reciprocal manner.   ? Time 6   ? Period Weeks   ? Status On-going   ? Target Date 10/02/21   ?  ? PT LONG TERM GOAL #2  ? Title PT to be able to balance on both legs for 20 seconds to decrease risk of falling so pt feels confident walking outside with a cane.   ? Time 6   ? Period Weeks   ? Status On-going   ? ?  ?  ? ?  ? ? ? ? ? ? ? ? Plan - 10/07/21 1229   ? ? Clinical Impression Statement Patient tolerated today's session well and actually had improved performance during the cable wlaking once the ankle weights were added. He had improved control during Lt stepping despite the clear lack in Rt hip abductor strength. There were only 4 total instances of a near LOB with 3 being with Rt stepping  and 1 on the Lt. The patient did appear to be more off balance at the beginning of today's session although this improved markedly by the end.   ? Personal Factors and Comorbidities Comorbidity 2;Age;Fitness;Time since onset of injury/illness/exacerbation   ? Comorbidities ACDF, DM, HTN   ? Examination-Activity Limitations Stairs;Transfers;Lift;Locomotion Level   ? Examination-Participation Restrictions Occupation;Meal Prep;Yard Work;Community Activity;Cleaning   ? Stability/Clinical Decision Making Stable/Uncomplicated   ? Rehab Potential Good   ? PT Frequency 2x / week   ? PT Duration 6 weeks   ? PT Treatment/Interventions Patient/family education;Therapeutic activities;Stair training;Balance training;Neuromuscular re-education;Therapeutic exercise   ? PT Next Visit Plan continue with skilled therapy.  Progress lateral hip strengthening exercises for the Rt hip, continue vector stances, and add heel toe gt.  Target weak Rt LE and bil hip extensors, Dorsi/plantar flexors and hamstrings.  Begin cable resisted ambulation next session.   ? PT Home Exercise Plan sitting ankle dorsi/plantarflexion, LAQ, bridge.; 2/23: heel raises, hip abd/ext; 2/27 - sidelying hip abd, clams, prone hip ext;3/7: heelraise, squat, step up , sit to stand, and Sidelying Hip Abduction - 2-3 x daily - 7 x weekly - 3 sets - 6 reps   ? Consulted and Agree with Plan of Care Patient   ? ?  ?  ? ?  ? ? ?Patient will benefit from skilled therapeutic intervention in order to improve the following deficits and impairments:  Decreased strength, Difficulty walking, Decreased mobility, Decreased balance, Decreased range of motion, Decreased knowledge of use of DME, Decreased activity tolerance, Decreased endurance ? ?Visit Diagnosis: ?Muscle weakness (generalized) ? ?Difficulty in walking, not elsewhere classified ? ? ? ? ?Problem List ?Patient Active Problem List  ? Diagnosis Date Noted  ? CAP (community acquired pneumonia) 08/01/2021  ? Nausea and  vomiting 08/01/2021  ? DM2 (diabetes mellitus, type 2) ([place]) 08/01/2021  ? Coronary artery disease involving native coronary artery of native heart without angina pectoris 07/31/2021  ? Stenosis of cervical spine with myelopathy ([place]) 06/03/2021  ? Macrocytic anemia 05/15/2021  ?  Bilateral lower extremity edema 05/15/2021  ? BPH (benign prostatic hyperplasia) 05/15/2021  ? Elevated PSA 05/15/2021  ? Cervical spinal stenosis 05/15/2021  ? DDD (degenerative disc disease), cervical 05/15/2021  ? Syncope 05/14/2021  ? Elevated hemoglobin A1c 04/10/2021  ? Chest pain 08/29/2017  ? Fever 08/29/2017  ? Acute URI 08/29/2017  ? Moderate dehydration 08/29/2017  ? Sepsis ([place]) 08/29/2017  ? Elevated lactic acid level 08/29/2017  ? Acute gastroenteritis 08/29/2017  ? Diarrhea 08/29/2017  ? GERD (gastroesophageal reflux disease) 08/29/2017  ? Positive D dimer 08/29/2017  ? Encounter for screening colonoscopy 02/01/2014  ? Orthostatic hypotension 01/15/2014  ? Dyslipidemia 08/14/2008  ? Essential hypertension 08/14/2008  ? Seasonal and perennial allergic rhinitis 04/23/2007  ? ? ?Adalberto Cole, PT ?10/07/2021, 12:34 PM ? ?[facility] ?[place] ?[address] ?[place], [place], [address] ?Phone: [phone]   Fax:  [phone] ? ?Name: ERUBIEL MANASCO ?MRN: [id number] ?Date of Birth: Jul 04, 1956 ? ? ? ?

## 2021-10-09 ENCOUNTER — Encounter (HOSPITAL_COMMUNITY): Payer: Self-pay | Admitting: Physical Therapy

## 2021-10-09 ENCOUNTER — Ambulatory Visit (HOSPITAL_COMMUNITY): Payer: BC Managed Care – PPO | Admitting: Physical Therapy

## 2021-10-09 DIAGNOSIS — R262 Difficulty in walking, not elsewhere classified: Secondary | ICD-10-CM

## 2021-10-09 DIAGNOSIS — M6281 Muscle weakness (generalized): Secondary | ICD-10-CM | POA: Diagnosis not present

## 2021-10-09 NOTE — Therapy (Signed)
[place] ?[place] ?[address] ?[place], [place], [address] ?Phone: [phone]   Fax:  [phone] ? ?Physical Therapy Treatment ? ?Patient Details  ?Name: Andrew Arnold ?MRN: [id number] ?Date of Birth: 11-06-1955 ?Referring Provider (PT): Lala Lund ? ? ?Encounter Date: 10/09/2021 ? ? PT End of Session - 10/09/21 0912   ? ? Visit Number 13   ? Number of Visits 20   ? Date for PT Re-Evaluation 10/30/21   ? Authorization Type BCBS   ? Authorization - Visit Number 12   ? Authorization - Number of Visits 30   ? Progress Note Due on Visit 20   ? PT Start Time 2703   ? PT Stop Time 0955   ? PT Time Calculation (min) 40 min   ? Activity Tolerance Patient tolerated treatment well   ? Behavior During Therapy [facility] for tasks assessed/performed   ? ?  ?  ? ?  ? ? ?Past Medical History:  ?Diagnosis Date  ? Allergic rhinitis due to pollen   ? on allergy shots  ? Arrhythmia   ? 1980s hospitalized for HTN and ?irregular rhythm but no specific dx but resolved during hospitalization  ? Diabetes mellitus without complication ([place])   ? HTN (hypertension)   ? Hyperlipidemia   ? ? ?Past Surgical History:  ?Procedure Laterality Date  ? ANTERIOR CERVICAL DECOMP/DISCECTOMY FUSION N/A 06/03/2021  ? Procedure: Anterior Cervical Decompression Fusion  Cervical three-four;  Surgeon: Vallarie Mare, MD;  Location: [facility];  Service: Neurosurgery;  Laterality: N/A;  ? colonoscopy  2005  ? Dr. Gala Romney: normal rectum, sigmoid diverticula in colonic mucosa  ? COLONOSCOPY N/A 02/15/2014  ? Procedure: COLONOSCOPY;  Surgeon: Daneil Dolin, MD;  [facility];  Service: Endoscopy;  Laterality: N/A;  2:00  ? RIGHT/LEFT HEART CATH AND CORONARY ANGIOGRAPHY N/A 05/23/2021  ? Procedure: RIGHT/LEFT HEART CATH AND CORONARY ANGIOGRAPHY;  Surgeon: Jolaine Artist, MD;  Location: [facility];  Service: Cardiovascular;  Laterality: N/A;  ? ? ?There were no vitals filed for this  visit. ? ? ? ? ? ? ? ? ? Subjective Assessment -  ?  ?  Subjective Pt states he does his exercises once a day.  It is still difficult for him to lift his legs   ?  Currently in Pain? No/denies   ?  ?   ?  ? ? ? ? ? ? ? ? ? ? ? ? ? ? ? ?  Knee/Hip Exercises: Standing  ?  Heel Raises 15 reps    ?  Toe raises  incline slope, toeraises incline; postural cues   ?  Rocker board 2 minutes  ?  Forward Lunges Both;20 reps   ?  Forward Lunges Limitations 4" step no UE   ?  Hip Abduction Both;2 sets;10 reps standing hip isometric into wall   ?  Hip Extension Both;2 sets;10 reps  forearms on counter and lift up  ?  Wall squat  10 reps   ?  SLS with Vectors ?Side stepping with green theraband  5x B with 5" hold with 1 HHA  ?2 RT   ?  Other Standing Knee Exercises tandem stance 30" x 2 each LE lead no UE   ?  Quadriped  Bird dog x 10 ?  ?  Knee/Hip Exercises: Seated  ?  Sit to Sand 10 reps;without UE support low mat   ? ? ? ? ? ? ? ? ? ? ? ? ?  PT Short Term Goals - 09/30/21 1029   ? ?  ? PT SHORT TERM GOAL #1  ? Title PT to be I in HEP to increase mm strength by 1/2 grade to allow pt to feel confident walking inside with a cane.   ? Time 3   ? Period Weeks   ? Status Partially Met   ? Target Date 09/11/21   ?  ? PT SHORT TERM GOAL #2  ? Title PT to be able to stand on his Rt LE for 15 seconds to reduce risk of falling   ? Time 3   ? Period Weeks   ? Status On-going   ? ?  ?  ? ?  ? ? ? ? PT Long Term Goals - 09/30/21 1031   ? ?  ? PT LONG TERM GOAL #1  ? Title Pt to be completing an advance HEP to increase LE strength by one grade to allow pt to go up and down 8 steps in a reciprocal manner.   ? Time 6   ? Period Weeks   ? Status On-going   ? Target Date 10/02/21   ?  ? PT LONG TERM GOAL #2  ? Title PT to be able to balance on both legs for 20 seconds to decrease risk of falling so pt feels confident walking outside with a cane.   ? Time 6   ? Period Weeks   ? Status On-going   ? ?  ?  ? ?  ? ? ? ? ?  ?  Plan - 10/02/21 1104   ?   ?  Clinical Impression Statement Added new balance and strengthening exercises with good technique although they are more challenging for patient.  Pt continues to have decreased strength and balance and will continue to benefit from skilled PT   ?  Personal Factors and Comorbidities Comorbidity 2;Age;Fitness;Time since onset of injury/illness/exacerbation   ?  Comorbidities ACDF, DM, HTN   ?  Examination-Activity Limitations Stairs;Transfers;Lift;Locomotion Level   ?  Examination-Participation Restrictions Occupation;Meal Prep;Yard Work;Community Activity;Cleaning   ?  Stability/Clinical Decision Making Stable/Uncomplicated   ?  Rehab Potential Good   ?  PT Frequency 2x / week   ?  PT Duration 6 weeks   ?  PT Treatment/Interventions Patient/family education;Therapeutic activities;Stair training;Balance training;Neuromuscular re-education;Therapeutic exercise   ?  PT Next Visit Plan continue with skilled therapy.  Progress lateral hip strengthening exercises for the Rt hip, continue vector stances, and add heel toe gt.  Target weak Rt LE and bil hip extensors, Dorsi/plantar flexors and hamstrings.  Begin cable resisted ambulation next session.   ?  PT Home Exercise Plan sitting ankle dorsi/plantarflexion, LAQ, bridge.; 2/23: heel raises, hip abd/ext; 2/27 - sidelying hip abd, clams, prone hip ext;3/7: heelraise, squat, step up , sit to stand, and Sidelying Hip Abduction - 2-3 x daily - 7 x weekly - 3 sets - 6 reps   ?  Consulted and Agree with Plan of Care Patient   ?  ?   ?  ?  ?   ?  ?  ?Patient will benefit from skilled therapeutic intervention in order to improve the following deficits and impairments:  Decreased strength, Difficulty walking, Decreased mobility, Decreased balance, Decreased range of motion, Decreased knowledge of use of DME, Decreased activity tolerance, Decreased endurance ? ? ? ? ?Visit Diagnosis: ?No diagnosis found. ? ? ? ? ?Problem List ?Patient Active Problem List  ? Diagnosis Date Noted   ?  CAP (community acquired pneumonia) 08/01/2021  ? Nausea and vomiting 08/01/2021  ? DM2 (diabetes mellitus, type 2) ([place]) 08/01/2021  ? Coronary artery disease involving native coronary artery of native heart without angina pectoris 07/31/2021  ? Stenosis of cervical spine with myelopathy ([place]) 06/03/2021  ? Macrocytic anemia 05/15/2021  ? Bilateral lower extremity edema 05/15/2021  ? BPH (benign prostatic hyperplasia) 05/15/2021  ? Elevated PSA 05/15/2021  ? Cervical spinal stenosis 05/15/2021  ? DDD (degenerative disc disease), cervical 05/15/2021  ? Syncope 05/14/2021  ? Elevated hemoglobin A1c 04/10/2021  ? Chest pain 08/29/2017  ? Fever 08/29/2017  ? Acute URI 08/29/2017  ? Moderate dehydration 08/29/2017  ? Sepsis ([place]) 08/29/2017  ? Elevated lactic acid level 08/29/2017  ? Acute gastroenteritis 08/29/2017  ? Diarrhea 08/29/2017  ? GERD (gastroesophageal reflux disease) 08/29/2017  ? Positive D dimer 08/29/2017  ? Encounter for screening colonoscopy 02/01/2014  ? Orthostatic hypotension 01/15/2014  ? Dyslipidemia 08/14/2008  ? Essential hypertension 08/14/2008  ? Seasonal and perennial allergic rhinitis 04/23/2007  ?Rayetta Humphrey, PT CLT ?[phone]  ?10/09/2021, 9:55AM ? ?[place] ?[place] ?[address] ?[place], [place], [address] ?Phone: [phone]   Fax:  [phone] ? ?Name: KAAN TOSH ?MRN: [id number] ?Date of Birth: 1956-02-19 ? ? ? ?

## 2021-10-14 ENCOUNTER — Ambulatory Visit (HOSPITAL_COMMUNITY): Payer: BC Managed Care – PPO | Attending: Internal Medicine

## 2021-10-14 ENCOUNTER — Encounter (HOSPITAL_COMMUNITY): Payer: Self-pay

## 2021-10-14 DIAGNOSIS — D649 Anemia, unspecified: Secondary | ICD-10-CM | POA: Diagnosis not present

## 2021-10-14 DIAGNOSIS — R262 Difficulty in walking, not elsewhere classified: Secondary | ICD-10-CM | POA: Diagnosis not present

## 2021-10-14 DIAGNOSIS — D519 Vitamin B12 deficiency anemia, unspecified: Secondary | ICD-10-CM | POA: Diagnosis not present

## 2021-10-14 DIAGNOSIS — M6281 Muscle weakness (generalized): Secondary | ICD-10-CM | POA: Insufficient documentation

## 2021-10-14 DIAGNOSIS — R799 Abnormal finding of blood chemistry, unspecified: Secondary | ICD-10-CM | POA: Diagnosis not present

## 2021-10-14 DIAGNOSIS — I10 Essential (primary) hypertension: Secondary | ICD-10-CM | POA: Diagnosis not present

## 2021-10-14 NOTE — Therapy (Signed)
[place] ?[place] ?[address] ?[place], [place], [address] ?Phone: [phone]   Fax:  [phone] ? ?Physical Therapy Treatment ? ?Patient Details  ?Name: Andrew Arnold ?MRN: [id number] ?Date of Birth: 1956/02/01 ?Referring Provider (PT): Lala Lund ? ? ?Encounter Date: 10/14/2021 ? ? PT End of Session - 10/14/21 1813   ? ? Visit Number 14   ? Number of Visits 20   ? Date for PT Re-Evaluation 10/30/21   ? Authorization Type BCBS   ? Authorization - Visit Number 13   ? Authorization - Number of Visits 30   ? Progress Note Due on Visit 20   ? PT Start Time 4854   ? PT Stop Time 6270   ? PT Time Calculation (min) 43 min   ? Activity Tolerance Patient tolerated treatment well   ? Behavior During Therapy [facility] for tasks assessed/performed   ? ?  ?  ? ?  ? ? ?Past Medical History:  ?Diagnosis Date  ? Allergic rhinitis due to pollen   ? on allergy shots  ? Arrhythmia   ? 1980s hospitalized for HTN and ?irregular rhythm but no specific dx but resolved during hospitalization  ? Diabetes mellitus without complication ([place])   ? HTN (hypertension)   ? Hyperlipidemia   ? ? ?Past Surgical History:  ?Procedure Laterality Date  ? ANTERIOR CERVICAL DECOMP/DISCECTOMY FUSION N/A 06/03/2021  ? Procedure: Anterior Cervical Decompression Fusion  Cervical three-four;  Surgeon: Vallarie Mare, MD;  Location: [facility];  Service: Neurosurgery;  Laterality: N/A;  ? colonoscopy  2005  ? Dr. Gala Romney: normal rectum, sigmoid diverticula in colonic mucosa  ? COLONOSCOPY N/A 02/15/2014  ? Procedure: COLONOSCOPY;  Surgeon: Daneil Dolin, MD;  [facility];  Service: Endoscopy;  Laterality: N/A;  2:00  ? RIGHT/LEFT HEART CATH AND CORONARY ANGIOGRAPHY N/A 05/23/2021  ? Procedure: RIGHT/LEFT HEART CATH AND CORONARY ANGIOGRAPHY;  Surgeon: Jolaine Artist, MD;  Location: [facility];  Service: Cardiovascular;  Laterality: N/A;  ? ? ?There were no vitals filed for this visit. ? ? Subjective  Assessment - 10/14/21 1752   ? ? Subjective Pt stated he has been walkins some wihtout cane, some difficulty advancing Rt LE   ? Pertinent History ACDF, DM, HTN, lung nodular   ? Patient Stated Goals To be able to walk normal without any assistive device   ? Currently in Pain? No/denies   ? ?  ?  ? ?  ? ? ? ? ? ? ? ? ? ? ? ? ? ? ? ? ? ? ? ? [facility] Adult PT Treatment/Exercise - 10/14/21 0001   ? ?  ? Knee/Hip Exercises: Standing  ? Heel Raises 20 reps   ? Heel Raises Limitations incline slope, toeraises incline; postural cues   ? Forward Lunges Both;20 reps   ? Forward Lunges Limitations 4" step no UE   ? Hip Abduction 15 reps   ? Abduction Limitations 3#   ? Hip Extension 15 reps   ? Extension Limitations 3#   ? Functional Squat 2 sets;15 reps   ? Other Standing Knee Exercises Sidestep with GTB around thigh 3# ankle; tandem stance on foam 2x 30   ? Other Standing Knee Exercises Toe tapping 6in step height   ?  ? Knee/Hip Exercises: Prone  ? Other [place] Exercises Quadruped :bird dog and fire hydrant   ? ?  ?  ? ?  ? ? ? ? ? ? ? ? ? ? ? ?  PT Short Term Goals - 09/30/21 1029   ? ?  ? PT SHORT TERM GOAL #1  ? Title PT to be I in HEP to increase mm strength by 1/2 grade to allow pt to feel confident walking inside with a cane.   ? Time 3   ? Period Weeks   ? Status Partially Met   ? Target Date 09/11/21   ?  ? PT SHORT TERM GOAL #2  ? Title PT to be able to stand on his Rt LE for 15 seconds to reduce risk of falling   ? Time 3   ? Period Weeks   ? Status On-going   ? ?  ?  ? ?  ? ? ? ? PT Long Term Goals - 09/30/21 1031   ? ?  ? PT LONG TERM GOAL #1  ? Title Pt to be completing an advance HEP to increase LE strength by one grade to allow pt to go up and down 8 steps in a reciprocal manner.   ? Time 6   ? Period Weeks   ? Status On-going   ? Target Date 10/02/21   ?  ? PT LONG TERM GOAL #2  ? Title PT to be able to balance on both legs for 20 seconds to decrease risk of falling so pt feels confident walking outside with a  cane.   ? Time 6   ? Period Weeks   ? Status On-going   ? ?  ?  ? ?  ? ? ? ? ? ? ? ? Plan - 10/14/21 1835   ? ? Clinical Impression Statement Continued functional strengthening and balalnce activities that found challenging for pt.  Continues to present with weak hip musculatre noted with difficulty with right foot clearance with gait and with SLS.  Balalnce activiites complete with SBA and intermittent HHA for safety.  Added hydrate to quadruped that was challenging as well as bird dog.   ? Personal Factors and Comorbidities Comorbidity 2;Age;Fitness;Time since onset of injury/illness/exacerbation   ? Comorbidities ACDF, DM, HTN   ? Examination-Activity Limitations Stairs;Transfers;Lift;Locomotion Level   ? Examination-Participation Restrictions Occupation;Meal Prep;Yard Work;Community Activity;Cleaning   ? Stability/Clinical Decision Making Stable/Uncomplicated   ? Clinical Decision Making Moderate   ? Rehab Potential Good   ? PT Frequency 2x / week   ? PT Duration 6 weeks   ? PT Treatment/Interventions Patient/family education;Therapeutic activities;Stair training;Balance training;Neuromuscular re-education;Therapeutic exercise   ? PT Next Visit Plan continue with skilled therapy.  Progress lateral hip strengthening exercises for the Rt hip, continue vector stances, and add heel toe gt.  Target weak Rt LE and bil hip extensors, Dorsi/plantar flexors and hamstrings.   ? PT Home Exercise Plan sitting ankle dorsi/plantarflexion, LAQ, bridge.; 2/23: heel raises, hip abd/ext; 2/27 - sidelying hip abd, clams, prone hip ext;3/7: heelraise, squat, step up , sit to stand, and Sidelying Hip Abduction - 2-3 x daily - 7 x weekly - 3 sets - 6 reps   ? Consulted and Agree with Plan of Care Patient   ? ?  ?  ? ?  ? ? ?Patient will benefit from skilled therapeutic intervention in order to improve the following deficits and impairments:  Decreased strength, Difficulty walking, Decreased mobility, Decreased balance, Decreased  range of motion, Decreased knowledge of use of DME, Decreased activity tolerance, Decreased endurance ? ?Visit Diagnosis: ?Muscle weakness (generalized) ? ?Difficulty in walking, not elsewhere classified ? ? ? ? ?Problem List ?Patient Active Problem List  ?  Diagnosis Date Noted  ? CAP (community acquired pneumonia) 08/01/2021  ? Nausea and vomiting 08/01/2021  ? DM2 (diabetes mellitus, type 2) ([place]) 08/01/2021  ? Coronary artery disease involving native coronary artery of native heart without angina pectoris 07/31/2021  ? Stenosis of cervical spine with myelopathy ([place]) 06/03/2021  ? Macrocytic anemia 05/15/2021  ? Bilateral lower extremity edema 05/15/2021  ? BPH (benign prostatic hyperplasia) 05/15/2021  ? Elevated PSA 05/15/2021  ? Cervical spinal stenosis 05/15/2021  ? DDD (degenerative disc disease), cervical 05/15/2021  ? Syncope 05/14/2021  ? Elevated hemoglobin A1c 04/10/2021  ? Chest pain 08/29/2017  ? Fever 08/29/2017  ? Acute URI 08/29/2017  ? Moderate dehydration 08/29/2017  ? Sepsis ([place]) 08/29/2017  ? Elevated lactic acid level 08/29/2017  ? Acute gastroenteritis 08/29/2017  ? Diarrhea 08/29/2017  ? GERD (gastroesophageal reflux disease) 08/29/2017  ? Positive D dimer 08/29/2017  ? Encounter for screening colonoscopy 02/01/2014  ? Orthostatic hypotension 01/15/2014  ? Dyslipidemia 08/14/2008  ? Essential hypertension 08/14/2008  ? Seasonal and perennial allergic rhinitis 04/23/2007  ? ?Ihor Austin, LPTA/CLT; CBIS ?[phone] ? ?Aldona Lento, PTA ?10/14/2021, 6:39 PM ? ?[place] ?[place] ?[address] ?[place], [place], [address] ?Phone: [phone]   Fax:  [phone] ? ?Name: MORTY ORTWEIN ?MRN: [id number] ?Date of Birth: Jun 14, 1956 ? ? ? ?

## 2021-10-16 ENCOUNTER — Ambulatory Visit (HOSPITAL_COMMUNITY): Payer: BC Managed Care – PPO

## 2021-10-16 DIAGNOSIS — R262 Difficulty in walking, not elsewhere classified: Secondary | ICD-10-CM

## 2021-10-16 DIAGNOSIS — M6281 Muscle weakness (generalized): Secondary | ICD-10-CM | POA: Diagnosis not present

## 2021-10-16 NOTE — Therapy (Signed)
[place] ?[place] ?[address] ?[place], [place], [address] ?Phone: [phone]   Fax:  [phone] ? ?Physical Therapy Treatment ? ?Patient Details  ?Name: Andrew Arnold ?MRN: [id number] ?Date of Birth: April 09, 1956 ?Referring Provider (PT): Lala Lund ? ? ?Encounter Date: 10/16/2021 ? ? PT End of Session - 10/16/21 1646   ? ? Visit Number 15   ? Number of Visits 20   ? Date for PT Re-Evaluation 10/30/21   ? Authorization Type BCBS   ? Authorization - Visit Number 14   ? Authorization - Number of Visits 30   ? Progress Note Due on Visit 20   ? PT Start Time 0100   ? PT Stop Time 7121   ? PT Time Calculation (min) 44 min   ? Equipment Utilized During Treatment Gait belt   ? Activity Tolerance Patient tolerated treatment well   ? Behavior During Therapy [facility] for tasks assessed/performed   ? ?  ?  ? ?  ? ? ? ?Past Medical History:  ?Diagnosis Date  ? Allergic rhinitis due to pollen   ? on allergy shots  ? Arrhythmia   ? 1980s hospitalized for HTN and ?irregular rhythm but no specific dx but resolved during hospitalization  ? Diabetes mellitus without complication ([place])   ? HTN (hypertension)   ? Hyperlipidemia   ? ? ?Past Surgical History:  ?Procedure Laterality Date  ? ANTERIOR CERVICAL DECOMP/DISCECTOMY FUSION N/A 06/03/2021  ? Procedure: Anterior Cervical Decompression Fusion  Cervical three-four;  Surgeon: Vallarie Mare, MD;  Location: [facility];  Service: Neurosurgery;  Laterality: N/A;  ? colonoscopy  2005  ? Dr. Gala Romney: normal rectum, sigmoid diverticula in colonic mucosa  ? COLONOSCOPY N/A 02/15/2014  ? Procedure: COLONOSCOPY;  Surgeon: Daneil Dolin, MD;  [facility];  Service: Endoscopy;  Laterality: N/A;  2:00  ? RIGHT/LEFT HEART CATH AND CORONARY ANGIOGRAPHY N/A 05/23/2021  ? Procedure: RIGHT/LEFT HEART CATH AND CORONARY ANGIOGRAPHY;  Surgeon: Jolaine Artist, MD;  Location: [facility];  Service: Cardiovascular;  Laterality: N/A;  ? ? ?There  were no vitals filed for this visit. ? ?Subjective Assessment - 10/16/21  ?  ?  Subjective Patient has returned back to work 3 x a week.; He is generally tired but no pain complaint today.   ?  Pertinent History ACDF, DM, HTN, lung nodular   ?  Patient Stated Goals To be able to walk normal without any assistive device   ?  Currently in Pain? No/denies   ? ? ? ?[facility] Adult PT Treatment/Exercise - 10/16/21 0001   ?  ?    ?     ?  Knee/Hip Exercises: Standing  ?  Heel Raises 20 reps   ?  Heel Raises Limitations incline slope, toeraises incline; postural cues   ?  Forward Lunges Both;20 reps   ?  Forward Lunges Limitations 4" step no UE   ?  Hip Abduction 15 reps   ?  Abduction Limitations 3#   ?  Hip Extension 15 reps   ?  Extension Limitations ?Hip flexion 3#  ?3# x 10 each alternating  ?  Functional Squat 2 sets;15 reps   ?  Other Standing Knee Exercises Sidestep with GTB around thigh 3# ankle; tandem stance on foam 2x 30   ?  Other Standing Knee Exercises Toe tapping 6in step height 2  x 10 each  ?  Heel toe walking blue line 'down and  back CGA   ?  Knee/Hip Exercises: Prone  ?  Other [place] Exercises Quadruped :bird dog and fire hydrant   ? ? ? ? ? ? ? ? ? ? ? ? ? ? ? ? ? ? ? ? ? ? ? ? ? ? ? ? ? ? ? ? ? ? PT Short Term Goals - 09/30/21 1029   ? ?  ? PT SHORT TERM GOAL #1  ? Title PT to be I in HEP to increase mm strength by 1/2 grade to allow pt to feel confident walking inside with a cane.   ? Time 3   ? Period Weeks   ? Status Partially Met   ? Target Date 09/11/21   ?  ? PT SHORT TERM GOAL #2  ? Title PT to be able to stand on his Rt LE for 15 seconds to reduce risk of falling   ? Time 3   ? Period Weeks   ? Status On-going   ? ?  ?  ? ?  ? ? ? ? PT Long Term Goals - 09/30/21 1031   ? ?  ? PT LONG TERM GOAL #1  ? Title Pt to be completing an advance HEP to increase LE strength by one grade to allow pt to go up and down 8 steps in a reciprocal manner.   ? Time 6   ? Period Weeks   ? Status On-going   ? Target Date  10/02/21   ?  ? PT LONG TERM GOAL #2  ? Title PT to be able to balance on both legs for 20 seconds to decrease risk of falling so pt feels confident walking outside with a cane.   ? Time 6   ? Period Weeks   ? Status On-going   ? ?  ?  ? ?  ? ? ? ? ? ? ? ? ?Clinical assessment: ?Today's session continued to work on strengthening and balance; added heel/toe walking. Patient needs CGA for safety; noted decreased control Left lower extremity with heel/toe walking; decreased strength noted Right hip flexion with added marching today.  Patient will benefit from skilled therapeutic intervention in order to improve the following deficits and impairments:    ? ?Visit Diagnosis: ?Muscle weakness (generalized) ? ?Difficulty in walking, not elsewhere classified ? ? ? ? ?Problem List ?Patient Active Problem List  ? Diagnosis Date Noted  ? CAP (community acquired pneumonia) 08/01/2021  ? Nausea and vomiting 08/01/2021  ? DM2 (diabetes mellitus, type 2) ([place]) 08/01/2021  ? Coronary artery disease involving native coronary artery of native heart without angina pectoris 07/31/2021  ? Stenosis of cervical spine with myelopathy ([place]) 06/03/2021  ? Macrocytic anemia 05/15/2021  ? Bilateral lower extremity edema 05/15/2021  ? BPH (benign prostatic hyperplasia) 05/15/2021  ? Elevated PSA 05/15/2021  ? Cervical spinal stenosis 05/15/2021  ? DDD (degenerative disc disease), cervical 05/15/2021  ? Syncope 05/14/2021  ? Elevated hemoglobin A1c 04/10/2021  ? Chest pain 08/29/2017  ? Fever 08/29/2017  ? Acute URI 08/29/2017  ? Moderate dehydration 08/29/2017  ? Sepsis ([place]) 08/29/2017  ? Elevated lactic acid level 08/29/2017  ? Acute gastroenteritis 08/29/2017  ? Diarrhea 08/29/2017  ? GERD (gastroesophageal reflux disease) 08/29/2017  ? Positive D dimer 08/29/2017  ? Encounter for screening colonoscopy 02/01/2014  ? Orthostatic hypotension 01/15/2014  ? Dyslipidemia 08/14/2008  ? Essential hypertension 08/14/2008  ? Seasonal and perennial  allergic rhinitis 04/23/2007  ? ? ?Plan: continue to progress lower  extremity and core strengthening and balance; try cable walkout next visit ? ? ? ?5:37 PM, 10/16/21 ?Chai Routh Small Teagan Ozawa MPT ?[facility] ?[place] [phone] ?[phone] ? ?[place] ?[place] ?[address] ?[place], [place], [address] ?Phone: [phone]   Fax:  [phone] ? ?Name: Andrew Arnold ?MRN: [id number] ?Date of Birth: 06/22/1956 ? ? ? ?

## 2021-10-22 ENCOUNTER — Ambulatory Visit (HOSPITAL_COMMUNITY): Payer: BC Managed Care – PPO | Admitting: Physical Therapy

## 2021-10-22 DIAGNOSIS — R262 Difficulty in walking, not elsewhere classified: Secondary | ICD-10-CM | POA: Diagnosis not present

## 2021-10-22 DIAGNOSIS — M6281 Muscle weakness (generalized): Secondary | ICD-10-CM

## 2021-10-22 NOTE — Therapy (Signed)
?OUTPATIENT PHYSICAL THERAPY TREATMENT NOTE ? ? ?Patient Name: Andrew Arnold ?MRN: [id number] ?DOB:December 20, 1955, 66 y.o., male ?Today's Date: 10/22/2021 ? ?PCP: [name], MD ?REFERRING PROVIDER: [name], MD ? ? PT End of Session - 10/22/21 1641   ? ? Visit Number 16   ? Number of Visits 20   ? Date for PT Re-Evaluation 10/30/21   ? Authorization Type BCBS   ? Authorization - Visit Number 15   ? Authorization - Number of Visits 30   ? Progress Note Due on Visit 20   ? PT Start Time 1642   ? PT Stop Time 6734   ? PT Time Calculation (min) 40 min   ? Equipment Utilized During Treatment Gait belt   ? Activity Tolerance Patient tolerated treatment well   ? Behavior During Therapy [facility] for tasks assessed/performed   ? ?  ?  ? ?  ? ? ?Past Medical History:  ?Diagnosis Date  ? Allergic rhinitis due to pollen   ? on allergy shots  ? Arrhythmia   ? 1980s hospitalized for HTN and ?irregular rhythm but no specific dx but resolved during hospitalization  ? Diabetes mellitus without complication ([place])   ? HTN (hypertension)   ? Hyperlipidemia   ? ?Past Surgical History:  ?Procedure Laterality Date  ? ANTERIOR CERVICAL DECOMP/DISCECTOMY FUSION N/A 06/03/2021  ? Procedure: Anterior Cervical Decompression Fusion  Cervical three-four;  Surgeon: Vallarie Mare, MD;  Location: [place];  Service: Neurosurgery;  Laterality: N/A;  ? colonoscopy  2005  ? Dr. Gala Romney: normal rectum, sigmoid diverticula in colonic mucosa  ? COLONOSCOPY N/A 02/15/2014  ? Procedure: COLONOSCOPY;  Surgeon: Daneil Dolin, MD;  [facility];  Service: Endoscopy;  Laterality: N/A;  2:00  ? RIGHT/LEFT HEART CATH AND CORONARY ANGIOGRAPHY N/A 05/23/2021  ? Procedure: RIGHT/LEFT HEART CATH AND CORONARY ANGIOGRAPHY;  Surgeon: Jolaine Artist, MD;  Location: [place] [facility];  Service: Cardiovascular;  Laterality: N/A;  ? ?Patient Active Problem List  ? Diagnosis Date Noted  ? CAP (community acquired pneumonia) 08/01/2021  ? Nausea and  vomiting 08/01/2021  ? DM2 (diabetes mellitus, type 2) ([place]) 08/01/2021  ? Coronary artery disease involving native coronary artery of native heart without angina pectoris 07/31/2021  ? Stenosis of cervical spine with myelopathy ([place]) 06/03/2021  ? Macrocytic anemia 05/15/2021  ? Bilateral lower extremity edema 05/15/2021  ? BPH (benign prostatic hyperplasia) 05/15/2021  ? Elevated PSA 05/15/2021  ? Cervical spinal stenosis 05/15/2021  ? DDD (degenerative disc disease), cervical 05/15/2021  ? Syncope 05/14/2021  ? Elevated hemoglobin A1c 04/10/2021  ? Chest pain 08/29/2017  ? Fever 08/29/2017  ? Acute URI 08/29/2017  ? Moderate dehydration 08/29/2017  ? Sepsis ([place]) 08/29/2017  ? Elevated lactic acid level 08/29/2017  ? Acute gastroenteritis 08/29/2017  ? Diarrhea 08/29/2017  ? GERD (gastroesophageal reflux disease) 08/29/2017  ? Positive D dimer 08/29/2017  ? Encounter for screening colonoscopy 02/01/2014  ? Orthostatic hypotension 01/15/2014  ? Dyslipidemia 08/14/2008  ? Essential hypertension 08/14/2008  ? Seasonal and perennial allergic rhinitis 04/23/2007  ? ? ?REFERRING DIAG: K52.9 (ICD-10-CM) - Acute gastroenteritis  ? ?THERAPY DIAG:  ?Muscle weakness (generalized) ? ?Difficulty in walking, not elsewhere classified ? ?PERTINENT HISTORY: ACDF, DM, HTN, lung nodular  ? ?PRECAUTIONS: None  ? ?SUBJECTIVE: Doing well today. Has been walking more without cane or AD.  ? ?PAIN:  ?Are you having pain? No ? ? ? ? ?TODAY'S TREATMENT:  ?10/22/21 ?Nustep lv3 5 min  dynamic warmup  ?Heel raise x20 ?Toe raises x20  ?Tandem stance on foam 3 x 20"  ?Stairs 7 inch step single rail up, 2 rails down 4RT, reciprocal  ?Machine walkouts 3 plates 5 x each direction  ?Step taps 7 inch with 5 lb ankle weights x30  ?Standing hip abduction with 5 lb ankle weights 2 x 10 each  ? ?PATIENT EDUCATION: ?Education details: on exercise form and function  ?Person educated: Patient ?Education method: Explanation and Demonstration ?Education  comprehension: verbalized understanding and returned demonstration ? ? ?HOME EXERCISE PROGRAM: ?sitting ankle dorsi/plantarflexion, LAQ, bridge.; 2/23: heel raises, hip abd/ext; 2/27 - sidelying hip abd, clams, prone hip ext;3/7: heelraise, squat, step up , sit to stand, and Sidelying Hip Abduction - 2-3 x daily - 7 x weekly - 3 sets - 6 reps  ? ? PT Short Term Goals - 09/30/21 1029   ? ?  ? PT SHORT TERM GOAL #1  ? Title PT to be I in HEP to increase mm strength by 1/2 grade to allow pt to feel confident walking inside with a cane.   ? Time 3   ? Period Weeks   ? Status Partially Met   ? Target Date 09/11/21   ?  ? PT SHORT TERM GOAL #2  ? Title PT to be able to stand on his Rt LE for 15 seconds to reduce risk of falling   ? Time 3   ? Period Weeks   ? Status On-going   ? ?  ?  ? ?  ? ? ? PT Long Term Goals - 09/30/21 1031   ? ?  ? PT LONG TERM GOAL #1  ? Title Pt to be completing an advance HEP to increase LE strength by one grade to allow pt to go up and down 8 steps in a reciprocal manner.   ? Time 6   ? Period Weeks   ? Status On-going   ? Target Date 10/02/21   ?  ? PT LONG TERM GOAL #2  ? Title PT to be able to balance on both legs for 20 seconds to decrease risk of falling so pt feels confident walking outside with a cane.   ? Time 6   ? Period Weeks   ? Status On-going   ? ?  ?  ? ?  ? ?  Plan   ?  ?  Clinical Impression Statement Patient tolerated session well today. Progressed LE strengthening with added machine walkouts. Also added stair ambulation. Patient did well overall but did demo slight LOB descending stairs due to RLE weakness. He was able to recover on his own using hand rails. Patient educated on purpose of exercise additions for strength and balance improvement. Patient will continue to benefit from skilled therapy services to reduce remaining deficits and improve functional ability.  ?  ?  Personal Factors and Comorbidities Comorbidity 2;Age;Fitness;Time since onset of  injury/illness/exacerbation   ?  Comorbidities ACDF, DM, HTN   ?  Examination-Activity Limitations Stairs;Transfers;Lift;Locomotion Level   ?  Examination-Participation Restrictions Occupation;Meal Prep;Yard Work;Community Activity;Cleaning   ?  Stability/Clinical Decision Making Stable/Uncomplicated   ?  Rehab Potential Good   ?  PT Frequency 2x / week   ?  PT Duration 6 weeks   ?  PT Treatment/Interventions Patient/family education;Therapeutic activities;Stair training;Balance training;Neuromuscular re-education;Therapeutic exercise   ?  PT Next Visit Plan Vector stance on foam. Continue machine walkouts. Maybe try adding leg press. RT quad strengthening   ?  ? ? ? ?  5:17 PM, 10/22/21 ?Josue Hector PT DPT  ?Physical Therapist with [facility]  ?[facility]  ?[phone] ? ? ?

## 2021-10-24 ENCOUNTER — Ambulatory Visit (HOSPITAL_COMMUNITY): Payer: BC Managed Care – PPO

## 2021-10-24 ENCOUNTER — Encounter (HOSPITAL_COMMUNITY): Payer: Self-pay

## 2021-10-24 DIAGNOSIS — M6281 Muscle weakness (generalized): Secondary | ICD-10-CM

## 2021-10-24 DIAGNOSIS — R262 Difficulty in walking, not elsewhere classified: Secondary | ICD-10-CM

## 2021-10-24 NOTE — Therapy (Signed)
?OUTPATIENT PHYSICAL THERAPY TREATMENT NOTE ? ? ?Patient Name: Andrew Arnold ?MRN: [id number] ?DOB:[birth year], 66 y.o., male ?Today's Date: 10/24/2021 ? ?PCP: [name], MD ?REFERRING PROVIDER: Thurnell Lose, MD ? ? PT End of Session - 10/24/21 1538   ? ? Visit Number 17   ? Number of Visits 20   ? Date for PT Re-Evaluation 10/30/21   ? Authorization Type BCBS   ? Authorization - Visit Number 16   ? Authorization - Number of Visits 30   ? Progress Note Due on Visit 20   ? PT Start Time 1533   ? PT Stop Time 1620   ? PT Time Calculation (min) 47 min   ? Equipment Utilized During Treatment --   CGA or //bars  ? Activity Tolerance Patient tolerated treatment well   ? Behavior During Therapy [facility] for tasks assessed/performed   ? ?  ?  ? ?  ? ? ? ?Past Medical History:  ?Diagnosis Date  ? Allergic rhinitis due to pollen   ? on allergy shots  ? Arrhythmia   ? 1980s hospitalized for HTN and ?irregular rhythm but no specific dx but resolved during hospitalization  ? Diabetes mellitus without complication ([place])   ? HTN (hypertension)   ? Hyperlipidemia   ? ?Past Surgical History:  ?Procedure Laterality Date  ? ANTERIOR CERVICAL DECOMP/DISCECTOMY FUSION N/A 06/03/2021  ? Procedure: Anterior Cervical Decompression Fusion  Cervical three-four;  Surgeon: Vallarie Mare, MD;  Location: [place];  Service: Neurosurgery;  Laterality: N/A;  ? colonoscopy  2005  ? Dr. Gala Romney: normal rectum, sigmoid diverticula in colonic mucosa  ? COLONOSCOPY N/A 02/15/2014  ? Procedure: COLONOSCOPY;  Surgeon: Daneil Dolin, MD;  [facility];  Service: Endoscopy;  Laterality: N/A;  2:00  ? RIGHT/LEFT HEART CATH AND CORONARY ANGIOGRAPHY N/A 05/23/2021  ? Procedure: RIGHT/LEFT HEART CATH AND CORONARY ANGIOGRAPHY;  Surgeon: Jolaine Artist, MD;  Location: [place] [facility];  Service: Cardiovascular;  Laterality: N/A;  ? ?Patient Active Problem List  ? Diagnosis Date Noted  ? CAP (community acquired pneumonia) 08/01/2021  ?  Nausea and vomiting 08/01/2021  ? DM2 (diabetes mellitus, type 2) ([place]) 08/01/2021  ? Coronary artery disease involving native coronary artery of native heart without angina pectoris 07/31/2021  ? Stenosis of cervical spine with myelopathy ([place]) 06/03/2021  ? Macrocytic anemia 05/15/2021  ? Bilateral lower extremity edema 05/15/2021  ? BPH (benign prostatic hyperplasia) 05/15/2021  ? Elevated PSA 05/15/2021  ? Cervical spinal stenosis 05/15/2021  ? DDD (degenerative disc disease), cervical 05/15/2021  ? Syncope 05/14/2021  ? Elevated hemoglobin A1c 04/10/2021  ? Chest pain 08/29/2017  ? Fever 08/29/2017  ? Acute URI 08/29/2017  ? Moderate dehydration 08/29/2017  ? Sepsis ([place]) 08/29/2017  ? Elevated lactic acid level 08/29/2017  ? Acute gastroenteritis 08/29/2017  ? Diarrhea 08/29/2017  ? GERD (gastroesophageal reflux disease) 08/29/2017  ? Positive D dimer 08/29/2017  ? Encounter for screening colonoscopy 02/01/2014  ? Orthostatic hypotension 01/15/2014  ? Dyslipidemia 08/14/2008  ? Essential hypertension 08/14/2008  ? Seasonal and perennial allergic rhinitis 04/23/2007  ? ? ?REFERRING DIAG: K52.9 (ICD-10-CM) - Acute gastroenteritis  ? ?THERAPY DIAG:  ?Muscle weakness (generalized) ? ?Difficulty in walking, not elsewhere classified ? ?PERTINENT HISTORY: ACDF, DM, HTN, lung nodular  ? ?PRECAUTIONS: None  ? ?SUBJECTIVE: Doing well today. Has been walking more without cane or AD. Exercises are going well at home. ? ?PAIN:  ?Are you having pain? No ? ? ? ? ?  TODAY'S TREATMENT:  ?10/24/21:  ?STS then heel raise no HHA 15x eccentric control ?Alternating knee flexion removed Rt LE 5# for increa ?d ROM 10x each ?Toe tapping 8in step 5# ankle weight ?Bodycraft walkouts all direction 3PL 5RT with CGA for safety ?Leg press BLE 2x 10 5Pl; Rt LE only 3Pl ?Hamstring curl 2PL Rt Le only 2x 10 slow control ?Stairs 7in reciprocal 2RT 1 HR ?SLS Lt 29", Rt 8" ?Vector 3x 5" ? ?10/22/21 ?Nustep lv3 5 min dynamic warmup  ?Heel raise  x20 ?Toe raises x20  ?Tandem stance on foam 3 x 20"  ?Stairs 7 inch step single rail up, 2 rails down 4RT, reciprocal  ?Machine walkouts 3 plates 5 x each direction  ?Step taps 7 inch with 5 lb ankle weights x30  ?Standing hip abduction with 5 lb ankle weights 2 x 10 each  ? ?PATIENT EDUCATION: ?Education details: on exercise form and function  ?Person educated: Patient ?Education method: Explanation and Demonstration ?Education comprehension: verbalized understanding and returned demonstration ? ? ?HOME EXERCISE PROGRAM: ?sitting ankle dorsi/plantarflexion, LAQ, bridge.; 2/23: heel raises, hip abd/ext; 2/27 - sidelying hip abd, clams, prone hip ext;3/7: heelraise, squat, step up , sit to stand, and Sidelying Hip Abduction - 2-3 x daily - 7 x weekly - 3 sets - 6 reps  ? ? PT Short Term Goals - 09/30/21 1029   ? ?  ? PT SHORT TERM GOAL #1  ? Title PT to be I in HEP to increase mm strength by 1/2 grade to allow pt to feel confident walking inside with a cane.   ? Time 3   ? Period Weeks   ? Status Partially Met   ? Target Date 09/11/21   ?  ? PT SHORT TERM GOAL #2  ? Title PT to be able to stand on his Rt LE for 15 seconds to reduce risk of falling   ? Time 3   ? Period Weeks   ? Status On-going   ? ?  ?  ? ?  ? ? ? PT Long Term Goals - 09/30/21 1031   ? ?  ? PT LONG TERM GOAL #1  ? Title Pt to be completing an advance HEP to increase LE strength by one grade to allow pt to go up and down 8 steps in a reciprocal manner.   ? Time 6   ? Period Weeks   ? Status On-going   ? Target Date 10/02/21   ?  ? PT LONG TERM GOAL #2  ? Title PT to be able to balance on both legs for 20 seconds to decrease risk of falling so pt feels confident walking outside with a cane.   ? Time 6   ? Period Weeks   ? Status On-going   ? ?  ?  ? ?  ? ?  Plan   ?  ?  Clinical Impression Statement Continued session focus with balance training and functional strengthening.  Pt continues to present with Rt gluteal, quad and hamstring weakness with  increased difficulty SLS.  Added leg press and hamstring machine with min cueing to improve control.  Pt tolerated well to session with no reports of increased pain, was limited by fatigue with activity. ?  ?  Personal Factors and Comorbidities Comorbidity 2;Age;Fitness;Time since onset of injury/illness/exacerbation   ?  Comorbidities ACDF, DM, HTN   ?  Examination-Activity Limitations Stairs;Transfers;Lift;Locomotion Level   ?  Examination-Participation Restrictions Occupation;Meal Prep;Yard Work;Community Activity;Cleaning   ?  Stability/Clinical Decision Making Stable/Uncomplicated   ?  Rehab Potential Good   ?  PT Frequency 2x / week   ?  PT Duration 6 weeks   ?  PT Treatment/Interventions Patient/family education;Therapeutic activities;Stair training;Balance training;Neuromuscular re-education;Therapeutic exercise   ?  PT Next Visit Plan Vector stance on foam. Continue machine walkouts. RT quad strengthening   ?  ? ? ?Ihor Austin, LPTA/CLT; CBIS ?[phone] ? ?4:29 PM, 10/24/21 ? ? ? ?

## 2021-10-25 DIAGNOSIS — Z1212 Encounter for screening for malignant neoplasm of rectum: Secondary | ICD-10-CM | POA: Diagnosis not present

## 2021-10-27 ENCOUNTER — Ambulatory Visit (HOSPITAL_COMMUNITY): Payer: BC Managed Care – PPO

## 2021-10-27 DIAGNOSIS — M6281 Muscle weakness (generalized): Secondary | ICD-10-CM

## 2021-10-27 DIAGNOSIS — R262 Difficulty in walking, not elsewhere classified: Secondary | ICD-10-CM | POA: Diagnosis not present

## 2021-10-27 NOTE — Therapy (Signed)
?OUTPATIENT PHYSICAL THERAPY TREATMENT NOTE ? ? ?Patient Name: Andrew Arnold ?MRN: [id number] ?DOB:09/26/1955, 66 y.o., male ?Today's Date: 10/27/2021 ? ?PCP: [name], MD ?REFERRING PROVIDER: Thurnell Lose, MD ? ? PT End of Session - 10/27/21 1646   ? ? Visit Number 18   ? Number of Visits 20   ? Date for PT Re-Evaluation 10/30/21   ? Authorization Type BCBS   ? Authorization - Visit Number 17   ? Authorization - Number of Visits 30   ? Progress Note Due on Visit 20   ? PT Start Time 2542   ? PT Stop Time 1730   ? PT Time Calculation (min) 43 min   ? Equipment Utilized During Treatment --   CGA or //bars  ? Activity Tolerance Patient tolerated treatment well   ? Behavior During Therapy [facility] for tasks assessed/performed   ? ?  ?  ? ?  ? ? ? ? ?Past Medical History:  ?Diagnosis Date  ? Allergic rhinitis due to pollen   ? on allergy shots  ? Arrhythmia   ? 1980s hospitalized for HTN and ?irregular rhythm but no specific dx but resolved during hospitalization  ? Diabetes mellitus without complication ([place])   ? HTN (hypertension)   ? Hyperlipidemia   ? ?Past Surgical History:  ?Procedure Laterality Date  ? ANTERIOR CERVICAL DECOMP/DISCECTOMY FUSION N/A 06/03/2021  ? Procedure: Anterior Cervical Decompression Fusion  Cervical three-four;  Surgeon: Vallarie Mare, MD;  Location: [place];  Service: Neurosurgery;  Laterality: N/A;  ? colonoscopy  2005  ? Dr. Gala Romney: normal rectum, sigmoid diverticula in colonic mucosa  ? COLONOSCOPY N/A 02/15/2014  ? Procedure: COLONOSCOPY;  Surgeon: Daneil Dolin, MD;  [facility];  Service: Endoscopy;  Laterality: N/A;  2:00  ? RIGHT/LEFT HEART CATH AND CORONARY ANGIOGRAPHY N/A 05/23/2021  ? Procedure: RIGHT/LEFT HEART CATH AND CORONARY ANGIOGRAPHY;  Surgeon: Jolaine Artist, MD;  Location: [place] [facility];  Service: Cardiovascular;  Laterality: N/A;  ? ?Patient Active Problem List  ? Diagnosis Date Noted  ? CAP (community acquired pneumonia) 08/01/2021  ?  Nausea and vomiting 08/01/2021  ? DM2 (diabetes mellitus, type 2) ([place]) 08/01/2021  ? Coronary artery disease involving native coronary artery of native heart without angina pectoris 07/31/2021  ? Stenosis of cervical spine with myelopathy ([place]) 06/03/2021  ? Macrocytic anemia 05/15/2021  ? Bilateral lower extremity edema 05/15/2021  ? BPH (benign prostatic hyperplasia) 05/15/2021  ? Elevated PSA 05/15/2021  ? Cervical spinal stenosis 05/15/2021  ? DDD (degenerative disc disease), cervical 05/15/2021  ? Syncope 05/14/2021  ? Elevated hemoglobin A1c 04/10/2021  ? Chest pain 08/29/2017  ? Fever 08/29/2017  ? Acute URI 08/29/2017  ? Moderate dehydration 08/29/2017  ? Sepsis ([place]) 08/29/2017  ? Elevated lactic acid level 08/29/2017  ? Acute gastroenteritis 08/29/2017  ? Diarrhea 08/29/2017  ? GERD (gastroesophageal reflux disease) 08/29/2017  ? Positive D dimer 08/29/2017  ? Encounter for screening colonoscopy 02/01/2014  ? Orthostatic hypotension 01/15/2014  ? Dyslipidemia 08/14/2008  ? Essential hypertension 08/14/2008  ? Seasonal and perennial allergic rhinitis 04/23/2007  ? ? ?REFERRING DIAG: K52.9 (ICD-10-CM) - Acute gastroenteritis  ? ?THERAPY DIAG:  ?Muscle weakness (generalized) ? ?Difficulty in walking, not elsewhere classified ? ?PERTINENT HISTORY: ACDF, DM, HTN, lung nodular  ? ?PRECAUTIONS: None  ? ?SUBJECTIVE: Patient reports no falls, no med changes; no new issues; trying to walk at home without AD ? ?PAIN:  ?Are you having pain? No ? ? ? ? ?  TODAY'S TREATMENT:  ? 10/27/21 ?STS then heel raise no HHA 15x eccentric control ?Marching on foam 1' x 2 ?Vector 8 times each side // bars ?Sidestepping in // bars with green thera-band x 3 passes ? ?Bodycraft walkouts all direction 3PL 5RT with CGA for safety ?Leg press BLE 2x 10 5Pl; Rt LE only 3 Pl x 10 ?Hamstring curl 2 1/2 PL Rt Le only 2x 15 slow control ? ?10/24/21:  ?STS then heel raise no HHA 15x eccentric control ?Alternating knee flexion removed Rt LE 5# for  increa ?d ROM 10x each ?Toe tapping 8in step 5# ankle weight ?Bodycraft walkouts all direction 3PL 5RT with CGA for safety ?Leg press BLE 2x 10 5Pl; Rt LE only 3Pl ?Hamstring curl 2PL Rt Le only 2x 10 slow control ?Stairs 7in reciprocal 2RT 1 HR ?SLS Lt 29", Rt 8" ?Vector 3x 5" ? ?10/22/21 ?Nustep lv3 5 min dynamic warmup  ?Heel raise x20 ?Toe raises x20  ?Tandem stance on foam 3 x 20"  ?Stairs 7 inch step single rail up, 2 rails down 4RT, reciprocal  ?Machine walkouts 3 plates 5 x each direction  ?Step taps 7 inch with 5 lb ankle weights x30  ?Standing hip abduction with 5 lb ankle weights 2 x 10 each  ? ?PATIENT EDUCATION: ?Education details: on exercise form and function  ?Person educated: Patient ?Education method: Explanation and Demonstration ?Education comprehension: verbalized understanding and returned demonstration ? ? ?HOME EXERCISE PROGRAM: ?sitting ankle dorsi/plantarflexion, LAQ, bridge.; 2/23: heel raises, hip abd/ext; 2/27 - sidelying hip abd, clams, prone hip ext;3/7: heelraise, squat, step up , sit to stand, and Sidelying Hip Abduction - 2-3 x daily - 7 x weekly - 3 sets - 6 reps  ? ? PT Short Term Goals - 09/30/21 1029   ? ?  ? PT SHORT TERM GOAL #1  ? Title PT to be I in HEP to increase mm strength by 1/2 grade to allow pt to feel confident walking inside with a cane.   ? Time 3   ? Period Weeks   ? Status Partially Met   ? Target Date 09/11/21   ?  ? PT SHORT TERM GOAL #2  ? Title PT to be able to stand on his Rt LE for 15 seconds to reduce risk of falling   ? Time 3   ? Period Weeks   ? Status On-going   ? ?  ?  ? ?  ? ? ? PT Long Term Goals - 09/30/21 1031   ? ?  ? PT LONG TERM GOAL #1  ? Title Pt to be completing an advance HEP to increase LE strength by one grade to allow pt to go up and down 8 steps in a reciprocal manner.   ? Time 6   ? Period Weeks   ? Status On-going   ? Target Date 10/02/21   ?  ? PT LONG TERM GOAL #2  ? Title PT to be able to balance on both legs for 20 seconds to  decrease risk of falling so pt feels confident walking outside with a cane.   ? Time 6   ? Period Weeks   ? Status On-going   ? ?  ?  ? ?  ? ?  Plan   ?  ?  Clinical Impression Statement Continued session focus with balance training and functional strengthening. Added weight  and reps to hamstring curls; added foam marching for balance. Patient walks in gym with  slightly forward flexed trunk; noted decreased stance R lower extremity with trunk compensation. Pt tolerated well to session with no reports of increased pain, was limited by fatigue with activity. ?  ?  Personal Factors and Comorbidities Comorbidity 2;Age;Fitness;Time since onset of injury/illness/exacerbation   ?  Comorbidities ACDF, DM, HTN   ?  Examination-Activity Limitations Stairs;Transfers;Lift;Locomotion Level   ?  Examination-Participation Restrictions Occupation;Meal Prep;Yard Work;Community Activity;Cleaning   ?  Stability/Clinical Decision Making Stable/Uncomplicated   ?  Rehab Potential Good   ?  PT Frequency 2x / week   ?  PT Duration 6 weeks   ?  PT Treatment/Interventions Patient/family education;Therapeutic activities;Stair training;Balance training;Neuromuscular re-education;Therapeutic exercise   ?  PT Next Visit Plan Vector stance on foam. Continue machine walkouts. RT quad strengthening   ?  ? ? ?5:19 PM, 10/27/21 ?Kalisa Girtman Small Wyndi Northrup MPT ?[facility] ?[place] [phone] ?[phone] ? ? ? ?

## 2021-10-29 DIAGNOSIS — E1165 Type 2 diabetes mellitus with hyperglycemia: Secondary | ICD-10-CM | POA: Diagnosis not present

## 2021-10-29 DIAGNOSIS — E559 Vitamin D deficiency, unspecified: Secondary | ICD-10-CM | POA: Diagnosis not present

## 2021-10-29 DIAGNOSIS — I10 Essential (primary) hypertension: Secondary | ICD-10-CM | POA: Diagnosis not present

## 2021-10-29 DIAGNOSIS — E6609 Other obesity due to excess calories: Secondary | ICD-10-CM | POA: Diagnosis not present

## 2021-10-30 ENCOUNTER — Ambulatory Visit (HOSPITAL_COMMUNITY): Payer: BC Managed Care – PPO

## 2021-10-30 DIAGNOSIS — M6281 Muscle weakness (generalized): Secondary | ICD-10-CM

## 2021-10-30 DIAGNOSIS — R262 Difficulty in walking, not elsewhere classified: Secondary | ICD-10-CM | POA: Diagnosis not present

## 2021-10-30 NOTE — Therapy (Signed)
OUTPATIENT PHYSICAL THERAPY TREATMENT NOTE  Progress Note Reporting Period 08/21/21 to 10/30/21  See note below for Objective Data and Assessment of Progress/Goals.       Patient Name: Andrew Arnold MRN: [id number] DOB:04-27-1956, 66 y.o., male Today's Date: 10/30/2021  PCP: Gareth Morgan, MD REFERRING PROVIDER: Leroy Sea, MD   PT End of Session - 10/30/21 1649     Visit Number 19    Number of Visits 20    Date for PT Re-Evaluation 10/30/21    Authorization Type BCBS    Authorization - Visit Number 18    Authorization - Number of Visits 30    Progress Note Due on Visit 20    PT Start Time 0445    PT Stop Time 0530    PT Time Calculation (min) 45 min    Equipment Utilized During Treatment --   CGA or //bars   Activity Tolerance Patient tolerated treatment well    Behavior During Therapy [facility] for tasks assessed/performed                Past Medical History:  Diagnosis Date   Allergic rhinitis due to pollen    on allergy shots   Arrhythmia    1980s hospitalized for HTN and ?irregular rhythm but no specific dx but resolved during hospitalization   Diabetes mellitus without complication (HCC)    HTN (hypertension)    Hyperlipidemia    Past Surgical History:  Procedure Laterality Date   ANTERIOR CERVICAL DECOMP/DISCECTOMY FUSION N/A 06/03/2021   Procedure: Anterior Cervical Decompression Fusion  Cervical three-four;  Surgeon: Bedelia Person, MD;  Location: [facility] OR;  Service: Neurosurgery;  Laterality: N/A;   colonoscopy  2005   Dr. Jena Gauss: normal rectum, sigmoid diverticula in colonic mucosa   COLONOSCOPY N/A 02/15/2014   Procedure: COLONOSCOPY;  Surgeon: Corbin Ade, MD;  [facility];  Service: Endoscopy;  Laterality: N/A;  2:00   RIGHT/LEFT HEART CATH AND CORONARY ANGIOGRAPHY N/A 05/23/2021   Procedure: RIGHT/LEFT HEART CATH AND CORONARY ANGIOGRAPHY;  Surgeon: Dolores Patty, MD;  Location: [facility];  Service:  Cardiovascular;  Laterality: N/A;   Patient Active Problem List   Diagnosis Date Noted   CAP (community acquired pneumonia) 08/01/2021   Nausea and vomiting 08/01/2021   DM2 (diabetes mellitus, type 2) (HCC) 08/01/2021   Coronary artery disease involving native coronary artery of native heart without angina pectoris 07/31/2021   Stenosis of cervical spine with myelopathy (HCC) 06/03/2021   Macrocytic anemia 05/15/2021   Bilateral lower extremity edema 05/15/2021   BPH (benign prostatic hyperplasia) 05/15/2021   Elevated PSA 05/15/2021   Cervical spinal stenosis 05/15/2021   DDD (degenerative disc disease), cervical 05/15/2021   Syncope 05/14/2021   Elevated hemoglobin A1c 04/10/2021   Chest pain 08/29/2017   Fever 08/29/2017   Acute URI 08/29/2017   Moderate dehydration 08/29/2017   Sepsis (HCC) 08/29/2017   Elevated lactic acid level 08/29/2017   Acute gastroenteritis 08/29/2017   Diarrhea 08/29/2017   GERD (gastroesophageal reflux disease) 08/29/2017   Positive D dimer 08/29/2017   Encounter for screening colonoscopy 02/01/2014   Orthostatic hypotension 01/15/2014   Dyslipidemia 08/14/2008   Essential hypertension 08/14/2008   Seasonal and perennial allergic rhinitis 04/23/2007    REFERRING DIAG: K52.9 (ICD-10-CM) - Acute gastroenteritis   THERAPY DIAG:  Muscle weakness (generalized)  Difficulty in walking, not elsewhere classified  PERTINENT HISTORY: ACDF, DM, HTN, lung nodular   PRECAUTIONS: None   SUBJECTIVE: Patient reports  no falls, no med changes; no new issues; trying to walk at home without AD some; he would like to be able to walk in the community without an AD.  He states he is overall better but has more improvement to make.   PAIN:  Are you having pain? No     TODAY'S TREATMENT:  10/30/21  Strength eval                       08/21/21 10/30/21    Right Hip Flexion 2+/5  3+/5    Right Hip Extension 2/5  2+/5    Right Hip ABduction 2/5  3-/5    Left  Hip Flexion 4-/5  4/5    Left Hip Extension 3-/5  3+/5    Left Hip ABduction 3/5  3+/5    Right Knee Flexion 2/5  3-/5    Right Knee Extension 3/5  5/5    Left Knee Flexion 3+/5  4+/5    Left Knee Extension 5/5  5/5    Right Ankle Dorsiflexion 3-/5  4-/5    Right Ankle Plantar Flexion 2/5  2+/5    Left Ankle Dorsiflexion 3-/5  4+/5    Left Ankle Plantar Flexion 3/5  3+/5           SLS Right foot 5 sec, left foot 20 sec   STS with red med ball in Ue's up to heel raise for balance 2 x 10  Bodycraft walkouts all direction 3PL 5RT with CGA for safety Leg press BLE 2x 10 5Pl; Rt LE only 3 Pl x 10 Hamstring curl 2 1/2 PL Rt Le only 2x 15 slow control         10/27/21 STS then heel raise no HHA 15x eccentric control Marching on foam 1' x 2 Vector 8 times each side // bars Sidestepping in // bars with green thera-band x 3 passes  Bodycraft walkouts all direction 3PL 5RT with CGA for safety Leg press BLE 2x 10 5Pl; Rt LE only 3 Pl x 10 Hamstring curl 2 1/2 PL Rt Le only 2x 15 slow control  10/24/21:  STS then heel raise no HHA 15x eccentric control Alternating knee flexion removed Rt LE 5# for increa d ROM 10x each Toe tapping 8in step 5# ankle weight Bodycraft walkouts all direction 3PL 5RT with CGA for safety Leg press BLE 2x 10 5Pl; Rt LE only 3Pl Hamstring curl 2PL Rt Le only 2x 10 slow control Stairs 7in reciprocal 2RT 1 HR SLS Lt 29", Rt 8" Vector 3x 5"  10/22/21 Nustep lv3 5 min dynamic warmup  Heel raise x20 Toe raises x20  Tandem stance on foam 3 x 20"  Stairs 7 inch step single rail up, 2 rails down 4RT, reciprocal  Machine walkouts 3 plates 5 x each direction  Step taps 7 inch with 5 lb ankle weights x30  Standing hip abduction with 5 lb ankle weights 2 x 10 each   PATIENT EDUCATION: Education details: on exercise form and function  Person educated: Patient Education method: Medical illustrator Education comprehension: verbalized  understanding and returned demonstration   HOME EXERCISE PROGRAM: sitting ankle dorsi/plantarflexion, LAQ, bridge.; 2/23: heel raises, hip abd/ext; 2/27 - sidelying hip abd, clams, prone hip ext;3/7: heelraise, squat, step up , sit to stand, and Sidelying Hip Abduction - 2-3 x daily - 7 x weekly - 3 sets - 6 reps    PT Short Term Goals - from 10/30/21  PT SHORT TERM GOAL #1   Title PT to be I in HEP to increase mm strength by 1/2 grade to allow pt to feel confident walking inside with a cane.    Time 3    Period Weeks    Status  Met walking with cane   Target Date 09/11/21      PT SHORT TERM GOAL #2   Title PT to be able to stand on his Rt LE for 15 seconds to reduce risk of falling    Time 2   Period Weeks    Status On-going, currently 5 sec             PT Long Term Goals - 10/30/21 1031       PT LONG TERM GOAL #1   Title Pt to be completing an advance HEP to increase LE strength by one grade to allow pt to go up and down 8 steps in a reciprocal manner.    Time 4   Period Weeks    Status On-going    Target Date 10/30/21     PT LONG TERM GOAL #2   Title PT to be able to balance on both legs for 20 seconds to decrease risk of falling so pt feels confident walking outside with a cane.    Time 4   Period Weeks  10/30/21   Status Partially met; Right leg 5 sec, Left 20 sec             Plan       Clinical Impression Statement Progress note done today; patient with overall improvement in all areas of MMTs and primarily ambulates with QC now although continues with some weakness and continued fall risk. Continued session focus with balance training and functional strengthening. Requesting additional visits to address remaining deficits and promote optimal functional mobility.     Personal Factors and Comorbidities Comorbidity 2;Age;Fitness;Time since onset of injury/illness/exacerbation     Comorbidities ACDF, DM, HTN     Examination-Activity Limitations  Stairs;Transfers;Lift;Locomotion Level     Examination-Participation Restrictions Occupation;Meal Prep;Yard Work;Community Activity;Cleaning     Stability/Clinical Decision Making Stable/Uncomplicated     Rehab Potential Good     PT Frequency 2x / week     PT Duration 6 weeks     PT Treatment/Interventions Patient/family education;Therapeutic activities;Stair training;Balance training;Neuromuscular re-education;Therapeutic exercise     PT Next Visit Plan Request continued PT 2 x week x 4 more weeks to address remaining deficits and promote optimal functional mobiltiy.       5:30 PM, 10/30/21 Ramiro Pangilinan Small Treniya Lobb MPT [facility] [place] [phone]

## 2021-10-31 DIAGNOSIS — E1165 Type 2 diabetes mellitus with hyperglycemia: Secondary | ICD-10-CM | POA: Diagnosis not present

## 2021-10-31 DIAGNOSIS — D519 Vitamin B12 deficiency anemia, unspecified: Secondary | ICD-10-CM | POA: Diagnosis not present

## 2021-10-31 DIAGNOSIS — D649 Anemia, unspecified: Secondary | ICD-10-CM | POA: Diagnosis not present

## 2021-10-31 DIAGNOSIS — E119 Type 2 diabetes mellitus without complications: Secondary | ICD-10-CM | POA: Diagnosis not present

## 2021-11-04 ENCOUNTER — Ambulatory Visit (HOSPITAL_COMMUNITY): Payer: BC Managed Care – PPO

## 2021-11-04 DIAGNOSIS — M6281 Muscle weakness (generalized): Secondary | ICD-10-CM

## 2021-11-04 DIAGNOSIS — R262 Difficulty in walking, not elsewhere classified: Secondary | ICD-10-CM

## 2021-11-04 NOTE — Therapy (Signed)
OUTPATIENT PHYSICAL THERAPY TREATMENT NOTE   Patient Name: Andrew Arnold MRN: [id number] DOB:08/22/1955, 66 y.o., male Today's Date: 11/04/2021  PCP: Gareth Morgan, MD REFERRING PROVIDER: Leroy Sea, MD   PT End of Session - 11/04/21 1734     Visit Number 20    Number of Visits 20    Date for PT Re-Evaluation 10/30/21    Authorization Type BCBS; check to see if new plan of care signed 4/25 check again    Authorization - Visit Number 19    Authorization - Number of Visits 30    Progress Note Due on Visit 20    PT Start Time 1650    PT Stop Time 1730    PT Time Calculation (min) 40 min    Equipment Utilized During Treatment --   CGA or //bars   Activity Tolerance Patient tolerated treatment well    Behavior During Therapy [facility] for tasks assessed/performed                 Past Medical History:  Diagnosis Date   Allergic rhinitis due to pollen    on allergy shots   Arrhythmia    1980s hospitalized for HTN and ?irregular rhythm but no specific dx but resolved during hospitalization   Diabetes mellitus without complication (HCC)    HTN (hypertension)    Hyperlipidemia    Past Surgical History:  Procedure Laterality Date   ANTERIOR CERVICAL DECOMP/DISCECTOMY FUSION N/A 06/03/2021   Procedure: Anterior Cervical Decompression Fusion  Cervical three-four;  Surgeon: Bedelia Person, MD;  Location: [facility] OR;  Service: Neurosurgery;  Laterality: N/A;   colonoscopy  2005   Dr. Jena Gauss: normal rectum, sigmoid diverticula in colonic mucosa   COLONOSCOPY N/A 02/15/2014   Procedure: COLONOSCOPY;  Surgeon: Corbin Ade, MD;  [facility];  Service: Endoscopy;  Laterality: N/A;  2:00   RIGHT/LEFT HEART CATH AND CORONARY ANGIOGRAPHY N/A 05/23/2021   Procedure: RIGHT/LEFT HEART CATH AND CORONARY ANGIOGRAPHY;  Surgeon: Dolores Patty, MD;  Location: [facility];  Service: Cardiovascular;  Laterality: N/A;   Patient Active Problem List   Diagnosis  Date Noted   CAP (community acquired pneumonia) 08/01/2021   Nausea and vomiting 08/01/2021   DM2 (diabetes mellitus, type 2) (HCC) 08/01/2021   Coronary artery disease involving native coronary artery of native heart without angina pectoris 07/31/2021   Stenosis of cervical spine with myelopathy (HCC) 06/03/2021   Macrocytic anemia 05/15/2021   Bilateral lower extremity edema 05/15/2021   BPH (benign prostatic hyperplasia) 05/15/2021   Elevated PSA 05/15/2021   Cervical spinal stenosis 05/15/2021   DDD (degenerative disc disease), cervical 05/15/2021   Syncope 05/14/2021   Elevated hemoglobin A1c 04/10/2021   Chest pain 08/29/2017   Fever 08/29/2017   Acute URI 08/29/2017   Moderate dehydration 08/29/2017   Sepsis (HCC) 08/29/2017   Elevated lactic acid level 08/29/2017   Acute gastroenteritis 08/29/2017   Diarrhea 08/29/2017   GERD (gastroesophageal reflux disease) 08/29/2017   Positive D dimer 08/29/2017   Encounter for screening colonoscopy 02/01/2014   Orthostatic hypotension 01/15/2014   Dyslipidemia 08/14/2008   Essential hypertension 08/14/2008   Seasonal and perennial allergic rhinitis 04/23/2007    REFERRING DIAG: K52.9 (ICD-10-CM) - Acute gastroenteritis   THERAPY DIAG:  Muscle weakness (generalized)  Difficulty in walking, not elsewhere classified  PERTINENT HISTORY: ACDF, DM, HTN, lung nodular   PRECAUTIONS: None   SUBJECTIVE: no pain; patient walks into PT today with SPC.  No falls.  PAIN:  Are you having pain? No     TODAY'S TREATMENT:  11/04/21 Nustep x 5' warm up Walkouts 3 plates x 5 each H/S curls 3 plates 3 x 15 reps Leg press both legs 5 plates 2 x 10, Right leg only 3 x 10 5# marching x 10 each leg    10/30/21  Strength eval                       08/21/21 10/30/21    Right Hip Flexion 2+/5  3+/5    Right Hip Extension 2/5  2+/5    Right Hip ABduction 2/5  3-/5    Left Hip Flexion 4-/5  4/5    Left Hip Extension 3-/5  3+/5    Left Hip  ABduction 3/5  3+/5    Right Knee Flexion 2/5  3-/5    Right Knee Extension 3/5  5/5    Left Knee Flexion 3+/5  4+/5    Left Knee Extension 5/5  5/5    Right Ankle Dorsiflexion 3-/5  4-/5    Right Ankle Plantar Flexion 2/5  2+/5    Left Ankle Dorsiflexion 3-/5  4+/5    Left Ankle Plantar Flexion 3/5  3+/5           SLS Right foot 5 sec, left foot 20 sec   STS with red med ball in Ue's up to heel raise for balance 2 x 10  Bodycraft walkouts all direction 3PL 5RT with CGA for safety Leg press BLE 2x 10 5Pl; Rt LE only 3 Pl x 10 Hamstring curl 2 1/2 PL Rt Le only 2x 15 slow control         10/27/21 STS then heel raise no HHA 15x eccentric control Marching on foam 1' x 2 Vector 8 times each side // bars Sidestepping in // bars with green thera-band x 3 passes  Bodycraft walkouts all direction 3PL 5RT with CGA for safety Leg press BLE 2x 10 5Pl; Rt LE only 3 Pl x 10 Hamstring curl 2 1/2 PL Rt Le only 2x 15 slow control  10/24/21:  STS then heel raise no HHA 15x eccentric control Alternating knee flexion removed Rt LE 5# for increa d ROM 10x each Toe tapping 8in step 5# ankle weight Bodycraft walkouts all direction 3PL 5RT with CGA for safety Leg press BLE 2x 10 5Pl; Rt LE only 3Pl Hamstring curl 2PL Rt Le only 2x 10 slow control Stairs 7in reciprocal 2RT 1 HR SLS Lt 29", Rt 8" Vector 3x 5"  10/22/21 Nustep lv3 5 min dynamic warmup  Heel raise x20 Toe raises x20  Tandem stance on foam 3 x 20"  Stairs 7 inch step single rail up, 2 rails down 4RT, reciprocal  Machine walkouts 3 plates 5 x each direction  Step taps 7 inch with 5 lb ankle weights x30  Standing hip abduction with 5 lb ankle weights 2 x 10 each   PATIENT EDUCATION: Education details: on exercise form and function  Person educated: Patient Education method: Medical illustrator Education comprehension: verbalized understanding and returned demonstration   HOME EXERCISE PROGRAM: sitting  ankle dorsi/plantarflexion, LAQ, bridge.; 2/23: heel raises, hip abd/ext; 2/27 - sidelying hip abd, clams, prone hip ext;3/7: heelraise, squat, step up , sit to stand, and Sidelying Hip Abduction - 2-3 x daily - 7 x weekly - 3 sets - 6 reps    PT Short Term Goals - from 10/30/21  PT SHORT TERM GOAL #1   Title PT to be I in HEP to increase mm strength by 1/2 grade to allow pt to feel confident walking inside with a cane.    Time 3    Period Weeks    Status  Met walking with cane   Target Date 09/11/21      PT SHORT TERM GOAL #2   Title PT to be able to stand on his Rt LE for 15 seconds to reduce risk of falling    Time 2   Period Weeks    Status On-going, currently 5 sec             PT Long Term Goals - 10/30/21 1031       PT LONG TERM GOAL #1   Title Pt to be completing an advance HEP to increase LE strength by one grade to allow pt to go up and down 8 steps in a reciprocal manner.    Time 4   Period Weeks    Status On-going    Target Date 10/30/21     PT LONG TERM GOAL #2   Title PT to be able to balance on both legs for 20 seconds to decrease risk of falling so pt feels confident walking outside with a cane.    Time 4   Period Weeks  10/30/21   Status Partially met; Right leg 5 sec, Left 20 sec             Plan       Clinical Impression Statement Today's session continued to address balance and core and Lower extremity weakness; increased weights on H/S curls and reps of leg press Right leg.  Therapist adjusted SPC to correct height for patient.  He still continues with Forward flex at the trunk but improves with verbal cues.  Patient will benefit from continued therapy services to address remaining deficits and promote optimal functional mobility.     Personal Factors and Comorbidities Comorbidity 2;Age;Fitness;Time since onset of injury/illness/exacerbation     Comorbidities ACDF, DM, HTN     Examination-Activity Limitations Stairs;Transfers;Lift;Locomotion Level      Examination-Participation Restrictions Occupation;Meal Prep;Yard Work;Community Activity;Cleaning     Stability/Clinical Decision Making Stable/Uncomplicated     Rehab Potential Good     PT Frequency 2x / week     PT Duration 6 weeks     PT Treatment/Interventions Patient/family education;Therapeutic activities;Stair training;Balance training;Neuromuscular re-education;Therapeutic exercise     PT Next Visit Plan Continue to work on lower extremity and core strengthening and dynamic balance.       5:40 PM, 11/04/21 Zemira Zehring Small Taegen Lennox MPT [place] [facility] [place] [phone]

## 2021-11-05 ENCOUNTER — Other Ambulatory Visit: Payer: BC Managed Care – PPO

## 2021-11-05 DIAGNOSIS — R911 Solitary pulmonary nodule: Secondary | ICD-10-CM

## 2021-11-05 DIAGNOSIS — R972 Elevated prostate specific antigen [PSA]: Secondary | ICD-10-CM

## 2021-11-06 ENCOUNTER — Ambulatory Visit (HOSPITAL_COMMUNITY): Payer: BC Managed Care – PPO | Admitting: Physical Therapy

## 2021-11-06 ENCOUNTER — Other Ambulatory Visit: Payer: BC Managed Care – PPO

## 2021-11-06 ENCOUNTER — Encounter (HOSPITAL_COMMUNITY): Payer: Self-pay | Admitting: Physical Therapy

## 2021-11-06 DIAGNOSIS — R262 Difficulty in walking, not elsewhere classified: Secondary | ICD-10-CM

## 2021-11-06 DIAGNOSIS — M6281 Muscle weakness (generalized): Secondary | ICD-10-CM | POA: Diagnosis not present

## 2021-11-06 NOTE — Therapy (Signed)
?OUTPATIENT PHYSICAL THERAPY TREATMENT NOTE ? ? ?Patient Name: Andrew Arnold ?MRN: [id number] ?DOB:[birth year], 66 y.o., male ?Today's Date: 11/06/2021 ? ?PCP: [name], MD ?REFERRING PROVIDER: Thurnell Lose, MD ? ? PT End of Session - 11/06/21 1644   ? ? Visit Number 21   ? Number of Visits 28   ? Date for PT Re-Evaluation 11/29/21   ? Authorization Type BCBS   ? Authorization - Visit Number 20   ? Authorization - Number of Visits 30   ? Progress Note Due on Visit 20   ? PT Start Time 8938   ? PT Stop Time 1017   ? PT Time Calculation (min) 40 min   ? Equipment Utilized During Treatment --   CGA or //bars  ? Activity Tolerance Patient tolerated treatment well   ? Behavior During Therapy [facility] for tasks assessed/performed   ? ?  ?  ? ?  ? ? ? ? ? ? ?Past Medical History:  ?Diagnosis Date  ? Allergic rhinitis due to pollen   ? on allergy shots  ? Arrhythmia   ? 1980s hospitalized for HTN and ?irregular rhythm but no specific dx but resolved during hospitalization  ? Diabetes mellitus without complication ([place])   ? HTN (hypertension)   ? Hyperlipidemia   ? ?Past Surgical History:  ?Procedure Laterality Date  ? ANTERIOR CERVICAL DECOMP/DISCECTOMY FUSION N/A 06/03/2021  ? Procedure: Anterior Cervical Decompression Fusion  Cervical three-four;  Surgeon: Vallarie Mare, MD;  Location: [place];  Service: Neurosurgery;  Laterality: N/A;  ? colonoscopy  2005  ? Dr. Gala Romney: normal rectum, sigmoid diverticula in colonic mucosa  ? COLONOSCOPY N/A 02/15/2014  ? Procedure: COLONOSCOPY;  Surgeon: Daneil Dolin, MD;  [facility];  Service: Endoscopy;  Laterality: N/A;  2:00  ? RIGHT/LEFT HEART CATH AND CORONARY ANGIOGRAPHY N/A 05/23/2021  ? Procedure: RIGHT/LEFT HEART CATH AND CORONARY ANGIOGRAPHY;  Surgeon: Jolaine Artist, MD;  Location: [place] [facility];  Service: Cardiovascular;  Laterality: N/A;  ? ?Patient Active Problem List  ? Diagnosis Date Noted  ? CAP (community acquired pneumonia) 08/01/2021   ? Nausea and vomiting 08/01/2021  ? DM2 (diabetes mellitus, type 2) ([place]) 08/01/2021  ? Coronary artery disease involving native coronary artery of native heart without angina pectoris 07/31/2021  ? Stenosis of cervical spine with myelopathy ([place]) 06/03/2021  ? Macrocytic anemia 05/15/2021  ? Bilateral lower extremity edema 05/15/2021  ? BPH (benign prostatic hyperplasia) 05/15/2021  ? Elevated PSA 05/15/2021  ? Cervical spinal stenosis 05/15/2021  ? DDD (degenerative disc disease), cervical 05/15/2021  ? Syncope 05/14/2021  ? Elevated hemoglobin A1c 04/10/2021  ? Chest pain 08/29/2017  ? Fever 08/29/2017  ? Acute URI 08/29/2017  ? Moderate dehydration 08/29/2017  ? Sepsis ([place]) 08/29/2017  ? Elevated lactic acid level 08/29/2017  ? Acute gastroenteritis 08/29/2017  ? Diarrhea 08/29/2017  ? GERD (gastroesophageal reflux disease) 08/29/2017  ? Positive D dimer 08/29/2017  ? Encounter for screening colonoscopy 02/01/2014  ? Orthostatic hypotension 01/15/2014  ? Dyslipidemia 08/14/2008  ? Essential hypertension 08/14/2008  ? Seasonal and perennial allergic rhinitis 04/23/2007  ? ? ?REFERRING DIAG: K52.9 (ICD-10-CM) - Acute gastroenteritis  ? ?THERAPY DIAG:  ?Muscle weakness (generalized) ? ?Difficulty in walking, not elsewhere classified ? ?PERTINENT HISTORY: ACDF, DM, HTN, lung nodular  ? ?PRECAUTIONS: None  ? ?SUBJECTIVE: no pain; patient walks into PT today with SPC.  No falls. ? ?PAIN:  ?Are you having pain? No ? ? ? ? ?  TODAY'S TREATMENT:  ?11/06/21 ?Nustep 5 min dynamic warmup lv3 ?Weighted walkouts 4 plates x 5 each  ?Power ups on 4 inch box with 3 sec holds x 15 each  ?Leg press both legs 5 plates 2 x 15, Right leg only 3 plates 2 x 15 ?SLS vectors 5 sec holds  ?Mini lunges onto 4 inch setp x 15 each with HHA  ?  ?11/04/21 ?Nustep x 5' warm up ?Walkouts 3 plates x 5 each ?H/S curls 3 plates 3 x 15 reps ?Leg press both legs 5 plates 2 x 10, Right leg only 3 x 10 ?5# marching x 10 each leg ? ? ? ?10/30/21 ? Strength  eval                       08/21/21 10/30/21  ?  Right Hip Flexion 2+/5  3+/5  ?  Right Hip Extension 2/5  2+/5  ?  Right Hip ABduction 2/5  3-/5  ?  Left Hip Flexion 4-/5  4/5  ?  Left Hip Extension 3-/5  3+/5  ?  Left Hip ABduction 3/5  3+/5  ?  Right Knee Flexion 2/5  3-/5  ?  Right Knee Extension 3/5  5/5  ?  Left Knee Flexion 3+/5  4+/5  ?  Left Knee Extension 5/5  5/5  ?  Right Ankle Dorsiflexion 3-/5  4-/5  ?  Right Ankle Plantar Flexion 2/5  2+/5  ?  Left Ankle Dorsiflexion 3-/5  4+/5  ?  Left Ankle Plantar Flexion 3/5  3+/5  ?  ?   ?  ?  ?SLS Right foot 5 sec, left foot 20 sec ? ? ?STS with red med ball in Ue's up to heel raise for balance 2 x 10 ? ?[place] walkouts all direction 3PL 5RT with CGA for safety ?Leg press BLE 2x 10 5Pl; Rt LE only 3 Pl x 10 ?Hamstring curl 2 1/2 PL Rt Le only 2x 15 slow control ? ? ?PATIENT EDUCATION: ?Education details: on exercise form and function  ?Person educated: Patient ?Education method: Explanation and Demonstration ?Education comprehension: verbalized understanding and returned demonstration ? ? ?HOME EXERCISE PROGRAM: ?sitting ankle dorsi/plantarflexion, LAQ, bridge.; 2/23: heel raises, hip abd/ext; 2/27 - sidelying hip abd, clams, prone hip ext;3/7: heelraise, squat, step up , sit to stand, and Sidelying Hip Abduction - 2-3 x daily - 7 x weekly - 3 sets - 6 reps  ? ? PT Short Term Goals - from 10/30/21  ? ?  ? PT SHORT TERM GOAL #1  ? Title PT to be I in HEP to increase mm strength by 1/2 grade to allow pt to feel confident walking inside with a cane.   ? Time 3   ? Period Weeks   ? Status  Met walking with cane  ? Target Date 09/11/21   ?  ? PT SHORT TERM GOAL #2  ? Title PT to be able to stand on his Rt LE for 15 seconds to reduce risk of falling   ? Time 2  ? Period Weeks   ? Status On-going, currently 5 sec  ? ?  ?  ? ?  ? ? ? PT Long Term Goals - 10/30/21 1031   ? ?  ? PT LONG TERM GOAL #1  ? Title Pt to be completing an advance HEP to increase LE strength by one  grade to allow pt to go up and down 8 steps in a reciprocal  manner.   ? Time 4  ? Period Weeks   ? Status On-going   ? Target Date 10/30/21  ?  ? PT LONG TERM GOAL #2  ? Title PT to be able to balance on both legs for 20 seconds to decrease risk of falling so pt feels confident walking outside with a cane.   ? Time 4  ? Period Weeks  10/30/21  ? Status Partially met; Right leg 5 sec, Left 20 sec  ? ?  ?  ? ?  ? ?  Plan   ?  ?  Clinical Impression Statement Patient tolerated session well today. Added lunging at steps to improve hip extension mobility. Added single leg stance weight vectors and power ups for improved single limb balance. Patient cued on limiting level of UE assist to challenge balance. Patient showing increased effort but reports minimal fatigue. Patient will continue to benefit from skilled therapy services to reduce remaining deficits and improve functional ability.  ?  ?  Personal Factors and Comorbidities Comorbidity 2;Age;Fitness;Time since onset of injury/illness/exacerbation   ?  Comorbidities ACDF, DM, HTN   ?  Examination-Activity Limitations Stairs;Transfers;Lift;Locomotion Level   ?  Examination-Participation Restrictions Occupation;Meal Prep;Yard Work;Community Activity;Cleaning   ?  Stability/Clinical Decision Making Stable/Uncomplicated   ?  Rehab Potential Good   ?  PT Frequency 2x / week   ?  PT Duration 4 weeks   ?  PT Treatment/Interventions Patient/family education;Therapeutic activities;Stair training;Balance training;Neuromuscular re-education;Therapeutic exercise   ?  PT Next Visit Plan Continue to work on lower extremity and core strengthening and dynamic balance.   ?  ? ?4:49 PM, 11/06/21 ?Josue Hector PT DPT  ?Physical Therapist with [place]  ?[facility]  ?[phone] ? ?[phone] ? ? ? ?

## 2021-11-07 ENCOUNTER — Encounter (HOSPITAL_COMMUNITY): Payer: Self-pay | Admitting: Internal Medicine

## 2021-11-07 ENCOUNTER — Ambulatory Visit (HOSPITAL_COMMUNITY)
Admission: RE | Admit: 2021-11-07 | Discharge: 2021-11-07 | Disposition: A | Discharge status: Home / Self Care | Payer: BC Managed Care – PPO | Source: Ambulatory Visit | Attending: Internal Medicine | Admitting: Internal Medicine

## 2021-11-07 VITALS — BP 140/80 | HR 73 | Wt 228.6 lb

## 2021-11-07 DIAGNOSIS — I10 Essential (primary) hypertension: Secondary | ICD-10-CM | POA: Diagnosis not present

## 2021-11-07 DIAGNOSIS — I2510 Atherosclerotic heart disease of native coronary artery without angina pectoris: Secondary | ICD-10-CM | POA: Diagnosis not present

## 2021-11-07 DIAGNOSIS — E785 Hyperlipidemia, unspecified: Secondary | ICD-10-CM | POA: Diagnosis not present

## 2021-11-07 DIAGNOSIS — I110 Hypertensive heart disease with heart failure: Secondary | ICD-10-CM | POA: Insufficient documentation

## 2021-11-07 DIAGNOSIS — I951 Orthostatic hypotension: Secondary | ICD-10-CM

## 2021-11-07 DIAGNOSIS — Z7984 Long term (current) use of oral hypoglycemic drugs: Secondary | ICD-10-CM | POA: Diagnosis not present

## 2021-11-07 DIAGNOSIS — E119 Type 2 diabetes mellitus without complications: Secondary | ICD-10-CM | POA: Insufficient documentation

## 2021-11-07 DIAGNOSIS — I5032 Chronic diastolic (congestive) heart failure: Secondary | ICD-10-CM | POA: Diagnosis not present

## 2021-11-07 DIAGNOSIS — R0683 Snoring: Secondary | ICD-10-CM

## 2021-11-07 DIAGNOSIS — Z79899 Other long term (current) drug therapy: Secondary | ICD-10-CM | POA: Diagnosis not present

## 2021-11-07 DIAGNOSIS — G4719 Other hypersomnia: Secondary | ICD-10-CM

## 2021-11-07 LAB — PSA, TOTAL AND FREE
PSA, Free Pct: 18.8 %
PSA, Free: 1.05 ng/mL
Prostate Specific Ag, Serum: 5.6 ng/mL — ABNORMAL HIGH (ref 0.0–4.0)

## 2021-11-07 MED ORDER — SPIRONOLACTONE 25 MG PO TABS: 12.5000 mg | ORAL_TABLET | Freq: Every day | ORAL | 6 refills | Status: DC

## 2021-11-07 NOTE — Progress Notes (Signed)
Height:  5'9"    ?Weight: 228 lb ?BMI: 33.76 ? ?Today's Date: 11/07/21 ? ?STOP BANG RISK ASSESSMENT ?S (snore) Have you been told that you snore?     YES ?  ?T (tired) Are you often tired, fatigued, or sleepy during the day?  ? YES  ?O (obstruction) Do you stop breathing, choke, or gasp during sleep? NO ?  ?P (pressure) Do you have or are you being treated for high blood pressure? YES ?  ?B (BMI) Is your body index greater than 35 kg/m? NO ?  ?A (age) Are you 17 years old or older? YES ?  ?N (neck) Do you have a neck circumference greater than 16 inches?  ?  ?  ?G (gender) Are you a male? YES ?  ?TOTAL STOP/BANG ?YES? ANSWERS 5  ? ?                                                                    For Office Use Only              Procedure Order Form    ?YES to 3+ Stop Bang questions OR two clinical symptoms - patient qualifies for WatchPAT (CPT 95800)     ? ?Clinical Notes: Will consult Sleep Specialist and refer for management of therapy due to patient increased risk of Sleep Apnea. Ordering a sleep study due to the following two clinical symptoms: Excessive daytime sleepiness G47.10 /  Loud snoring R06.83 / History of high blood pressure R03.0  ? ? ? ?

## 2021-11-07 NOTE — Progress Notes (Signed)
? ?ADVANCED HF CLINIC NOTE ? ? ?Primary Care: Dr Karie Kirks  ?HF Cardiologist: Dr. Haroldine Laws ?Neuro Surgeon: Dr Marcello Moores  ? ?HPI: ?Mr Macnaughton is a 66 y.o. with history of syncope, HTN, DM2, HL and diastolic HF ? ?Followed by neuro urgery for RUE/LUE weakness.. CT with cervical spine stenosis ? ?On 05/15/2021 presented ED with syncope. Standing in front of the refrigerator then woke up on the floor.  EKG SR 64 bpm. ECHO EF 65-70%, LVH, grade II DD, and normal RV. Carotids and lower extremity dopplers normal. ? ?Seen in [facility] with progessive LE edema and SOB. Meds adjusted and referred for cath as part of pre-op clearance. Cath 05/23/21 as below non-obstructive CAD and normal RHC. ? ?He underwent successful cervical decompression on 88/32/54 without complication.  ? ?Doing well at follow up 11/22, dilt stopped and Entresto started. ? ?Admitted 1/23 with PNA/sepsis. CT chest/abd/pelvis findings suspicious for PNA vs malignancy. Hospitalization c/b AKI and low BP, Entresto, HCTZ, and clonidine stopped at discharge. ? ?Today he returns for HF follow up. Overall feeling fine. He is not SOB walking on flat ground, uses a quad cane. Continues to work with PT and is making improvements. Denies palpitations, CP, dizziness, edema, or PND/Orthopnea. Appetite ok. No fever or chills. Scale at home not working. Taking all medications. BP 130/80s at home. ? ?Cardiac Studies: ?- Echo (11/22): EF 65-70%, LVH, grade II DD, and normal RV.  ? ?- Cath 05/23/21  ? ?  Prox RCA lesion is 20% stenosed. ?  Prox Cx lesion is 30% stenosed. ?  Prox LAD to Mid LAD lesion is 30% stenosed. ?  Dist LAD-1 lesion is 50% stenosed. ?  Dist LAD-2 lesion is 70% stenosed. ?  The left ventricular ejection fraction is greater than 65% by visual estimate. ?Ao  = 132/81 (99) ?LV = 147/10 ?RA = 2 ?RV = 26/6 ?PA = 24/1 (14) ?PCW = 6 ?Fick cardiac output/index = 8.0/3.5 ?PVR = 1.0 WU ?Ao sat = 99% ?PA sat = 72%, 73% ?SVC sat = 76% ?  ?Assessment: ?1. Mild to  moderate non-obstructive CAD ?2. EF 65-70% ?3. Normal filling pressures ?4. High cardiac output with no evidence of intracardiac shunting ? ? ?Past Medical History:  ?Diagnosis Date  ? Allergic rhinitis due to pollen   ? on allergy shots  ? Arrhythmia   ? 1980s hospitalized for HTN and ?irregular rhythm but no specific dx but resolved during hospitalization  ? Diabetes mellitus without complication ([place])   ? HTN (hypertension)   ? Hyperlipidemia   ? ?Current Outpatient Medications  ?Medication Sig Dispense Refill  ? amLODipine (NORVASC) 10 MG tablet Take 1 tablet (10 mg total) by mouth daily. 30 tablet 0  ? ascorbic acid (VITAMIN C) 500 MG tablet Take 500 mg by mouth every morning.     ? aspirin 81 MG chewable tablet Chew 1 tablet (81 mg total) by mouth daily. 30 tablet 0  ? atorvastatin (LIPITOR) 80 MG tablet Take 80 mg by mouth at bedtime.    ? B Complex Vitamins (B COMPLEX 100 PO) Take 1 tablet by mouth every morning.     ? carvedilol (COREG) 6.25 MG tablet Take 1 tablet (6.25 mg total) by mouth 2 (two) times daily with a meal. 60 tablet 0  ? cetirizine (ZYRTEC) 10 MG tablet Take 10 mg by mouth in the morning.    ? dapagliflozin propanediol (FARXIGA) 10 MG TABS tablet Take 1 tablet (10 mg total) by mouth daily before  breakfast. 90 tablet 3  ? metFORMIN (GLUCOPHAGE) 1000 MG tablet Take 1,000 mg by mouth 2 (two) times daily.    ? pantoprazole (PROTONIX) 40 MG tablet Take 40 mg by mouth daily.    ? potassium chloride SA (KLOR-CON M20) 20 MEQ tablet Take 1 tablet (20 mEq total) by mouth daily. 90 tablet 3  ? sacubitril-valsartan (ENTRESTO) 24-26 MG Take 1 tablet by mouth 2 (two) times daily. 180 tablet 3  ? tamsulosin (FLOMAX) 0.4 MG CAPS capsule Take 1 capsule (0.4 mg total) by mouth daily. 30 capsule 11  ? Vitamin D, Ergocalciferol, (DRISDOL) 1.25 MG (50000 UNIT) CAPS capsule Take 50,000 Units by mouth every 7 (seven) days.    ? ?No current facility-administered medications for this encounter.  ? ?Allergies   ?Allergen Reactions  ? Penicillins   ? ? ?Social History  ? ?Socioeconomic History  ? Marital status: Single  ?  Spouse name: Not on file  ? Number of children: Not on file  ? Years of education: Not on file  ? Highest education level: Not on file  ?Occupational History  ? Occupation: Animal nutritionist  ?Tobacco Use  ? Smoking status: Never  ? Smokeless tobacco: Never  ?Vaping Use  ? Vaping Use: Never used  ?Substance and Sexual Activity  ? Alcohol use: No  ? Drug use: No  ? Sexual activity: Yes  ?Other Topics Concern  ? Not on file  ?Social History Narrative  ? 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 Lives in [place]  ?   ?   ?   ? ?Social Determinants of Health  ? ?Financial Resource Strain: Not on file  ?Food Insecurity: Not on file  ?Transportation Needs: Not on file  ?Physical Activity: Not on file  ?Stress: Not on file  ?Social Connections: Not on file  ?Intimate Partner Violence: Not on file  ? ?Family History  ?Problem Relation Age of Onset  ? Cancer Mother   ? Cirrhosis Father   ?     cirrhosis of the liver; alcohol related  ? Diabetes Sister   ? Other Other   ?     neice was on allergy vaccine when young  ? Colon cancer Neg Hx   ? ?BP 140/80   Pulse 73   Wt 103.7 kg (228 lb 9.6 oz)   SpO2 99%   BMI 33.76 kg/m?  ? ?Wt Readings from Last 3 Encounters:  ?11/07/21 103.7 kg (228 lb 9.6 oz)  ?09/24/21 103.1 kg (227 lb 6.4 oz)  ?09/17/21 104.1 kg (229 lb 6.4 oz)  ? ?PHYSICAL EXAM: ?General:  NAD. No resp difficulty, walked into clinic with cane. ?HEENT: Normal ?Neck: Supple. No JVD. Carotids 2+ bilat; no bruits. No lymphadenopathy or thryomegaly appreciated. ?Cor: PMI nondisplaced. Regular rate & rhythm. No rubs, gallops or murmurs. ?Lungs: Clear ?Abdomen: Obese, nontender, nondistended. No hepatosplenomegaly. No bruits or masses. Good bowel sounds. ?Extremities: No cyanosis, clubbing, rash, edema ?Neuro: Alert & oriented x 3, cranial nerves grossly intact. Moves all 4 extremities w/o difficulty. Affect  pleasant. ? ?ASSESSMENT & PLAN: ?1. CAD ?- Cath 11/22 with moderate non-obstructive CAD  ?- No s/s angina.  ?- Continue atorva + ASA. ? ?2. Chronic HFpEF/LE edema ?- Echo 05/15/21 EF 65-70% RV normal ?- Cath 11/22 normal filling pressures. May have component of venous insufficiency. ?- NYHA I-II, volume looks good today. ?- Start Farxiga 10 mg daily.  ?- Continue Entresto 24/26 mg bid. We discussed increasing today, he had alteration with taste  at higher dose. ?- Continue carvedilol 6.25 mg bid. ?- Encouraged compression hose. ?- Consider PYP in future. ?- Labs today. ? ?3.Syncope  ?- Had syncopal episode 05/14/21 . Unclear etiology.  ?- Evaluated in the E.D ?- Carotid Dopplers negative. Echo EF 65-70%  ?- Zio 11/22 No significant dysrhythmias.  ?- No driving x 6 months (~5/36) ?- No further events. ? ?4. HTN  ?- BP elevated today. ?- Better control on home readings. ?- Continue to check BP at home and log. Bring readings to next visit. ? ?5.  Hyperlipidemia  ?- Continue statin.  ? ?6. DMII ?- On metformin ?- Start SGLT2i as above.   ? ?7. Lung nodule ?- Incidental finding on CT. ?- He follows with Pulmonary. ? ?8. Snoring ?- check home sleep study ? ?Glori Bickers, MD  ?2:31 PM ?

## 2021-11-07 NOTE — Patient Instructions (Signed)
Medication Changes: ? ?START Spironolactone 12.5 mg (1/2 tab) Daily ? ?Lab Work: ? ?Your physician recommends that you return for lab work in: 2 weeks, THIS CAN BE DONE AT [facility] (around 11/21/21) ? ?Testing/Procedures: ? ?Your provider has recommended that you have a home sleep study.  We have provided you with the equipment in our office today. Please download the app and follow the instructions. YOUR PIN NUMBER IS: [id number]. DO NOT OPEN THE BOX AND COMPLETE THE TEST UNTIL WE CALL AND ADVISE YOU TO DO SO. Once you have completed the test you just dispose of the equipment, the information is automatically uploaded to [place] via blue-tooth technology. If your test is positive for sleep apnea and you need a home CPAP machine you will be contacted by Dr Theodosia Blender office [facility]) to set this up. ? ? ?Referrals: ? ?None ? ?Special Instructions // Education: ? ?Do the following things EVERYDAY: ?Weigh yourself in the morning before breakfast. Write it down and keep it in a log. ?Take your medicines as prescribed ?Eat low salt foods--Limit salt (sodium) to 2000 mg per day.  ?Stay as active as you can everyday ?Limit all fluids for the day to less than 2 liters ? ? ?Follow-Up in: 6 months (Oct/Nov 2023), **PLEASE CALL OUR OFFICE IN AUGUST TO SCHEDULE THIS APPOINTMENT ? ?At the [facility], you and your health needs are our priority. We have a designated team specialized in the treatment of Heart Failure. This Care Team includes your primary Heart Failure Specialized Cardiologist (physician), Advanced Practice Providers (APPs- Physician Assistants and Nurse Practitioners), and Pharmacist who all work together to provide you with the care you need, when you need it.  ? ?You may see any of the following providers on your designated Care Team at your next follow up: ? ?Dr Glori Bickers ?Dr Loralie Champagne ?Darrick Grinder, NP ?Lyda Jester, PA ?Jessica Milford,NP ?Marlyce Huge, PA ?Audry Riles,  PharmD ? ? ?Please be sure to bring in all your medications bottles to every appointment.  ? ?Need to Contact us: ? ?If you have any questions or concerns before your next appointment please send [place] a message through [place] or call our office at [phone].   ? ?TO LEAVE A MESSAGE FOR THE NURSE SELECT OPTION 2, PLEASE LEAVE A MESSAGE INCLUDING: ?YOUR NAME ?DATE OF BIRTH ?CALL BACK NUMBER ?REASON FOR CALL**this is important as we prioritize the call backs ? ?YOU WILL RECEIVE A CALL BACK THE SAME DAY AS LONG AS YOU CALL BEFORE 4:00 PM ? ? ?

## 2021-11-07 NOTE — Progress Notes (Signed)
? ?ADVANCED HF CLINIC NOTE ? ? ?Primary Care: Dr Karie Kirks  ?HF Cardiologist: Dr. Haroldine Laws ?Neuro Surgeon: Dr Marcello Moores  ? ?HPI: ?[name] is a 66 y.o. with history of syncope, HTN, DM2, HL and diastolic HF ? ?On 05/15/2021 presented ED with syncope. Standing in front of the refrigerator then woke up on the floor.  EKG SR 64 bpm. ECHO EF 65-70%, LVH, grade II DD, and normal RV. Carotids and lower extremity dopplers normal. ? ?Seen in [facility] with progessive LE edema and SOB. Meds adjusted and referred for cath as part of pre-op clearance. Cath 05/23/21 as below non-obstructive CAD and normal RHC. ? ?He underwent successful cervical decompression on 16/10/96 without complication.  ? ?Admitted 1/23 with PNA/sepsis. CT chest/abd/pelvis findings suspicious for PNA vs malignancy. Hospitalization c/b AKI and low BP, Entresto, HCTZ, and clonidine stopped at discharge. ? ?Today he returns for HF follow up. Going to outpatient rehab at [facility] 2x/week and getting around better. Using single cane. Denies SOB, edema, orthopnea or PND. Compliant with meds. No dizziness or low BP.  ? ?Cardiac Studies: ?- Echo (11/22): EF 65-70%, LVH, grade II DD, and normal RV.  ? ?- Cath 05/23/21  ? ?  Prox RCA lesion is 20% stenosed. ?  Prox Cx lesion is 30% stenosed. ?  Prox LAD to Mid LAD lesion is 30% stenosed. ?  Dist LAD-1 lesion is 50% stenosed. ?  Dist LAD-2 lesion is 70% stenosed. ?  The left ventricular ejection fraction is greater than 65% by visual estimate. ?Ao  = 132/81 (99) ?LV = 147/10 ?RA = 2 ?RV = 26/6 ?PA = 24/1 (14) ?PCW = 6 ?Fick cardiac output/index = 8.0/3.5 ?PVR = 1.0 WU ?Ao sat = 99% ?PA sat = 72%, 73% ?SVC sat = 76% ?  ?Assessment: ?1. Mild to moderate non-obstructive CAD ?2. EF 65-70% ?3. Normal filling pressures ?4. High cardiac output with no evidence of intracardiac shunting ? ? ?Past Medical History:  ?Diagnosis Date  ? Allergic rhinitis due to pollen   ? on allergy shots  ? Arrhythmia   ? 1980s hospitalized for  HTN and ?irregular rhythm but no specific dx but resolved during hospitalization  ? Diabetes mellitus without complication ([place])   ? HTN (hypertension)   ? Hyperlipidemia   ? ?Current Outpatient Medications  ?Medication Sig Dispense Refill  ? amLODipine (NORVASC) 10 MG tablet Take 1 tablet (10 mg total) by mouth daily. 30 tablet 0  ? ascorbic acid (VITAMIN C) 500 MG tablet Take 500 mg by mouth every morning.     ? aspirin 81 MG chewable tablet Chew 1 tablet (81 mg total) by mouth daily. 30 tablet 0  ? atorvastatin (LIPITOR) 80 MG tablet Take 80 mg by mouth at bedtime.    ? B Complex Vitamins (B COMPLEX 100 PO) Take 1 tablet by mouth every morning.     ? carvedilol (COREG) 6.25 MG tablet Take 1 tablet (6.25 mg total) by mouth 2 (two) times daily with a meal. 60 tablet 0  ? cetirizine (ZYRTEC) 10 MG tablet Take 10 mg by mouth in the morning.    ? dapagliflozin propanediol (FARXIGA) 10 MG TABS tablet Take 1 tablet (10 mg total) by mouth daily before breakfast. 90 tablet 3  ? metFORMIN (GLUCOPHAGE) 1000 MG tablet Take 1,000 mg by mouth 2 (two) times daily.    ? pantoprazole (PROTONIX) 40 MG tablet Take 40 mg by mouth daily.    ? potassium chloride SA (KLOR-CON M20) 20  MEQ tablet Take 1 tablet (20 mEq total) by mouth daily. 90 tablet 3  ? sacubitril-valsartan (ENTRESTO) 24-26 MG Take 1 tablet by mouth 2 (two) times daily. 180 tablet 3  ? tamsulosin (FLOMAX) 0.4 MG CAPS capsule Take 1 capsule (0.4 mg total) by mouth daily. 30 capsule 11  ? Vitamin D, Ergocalciferol, (DRISDOL) 1.25 MG (50000 UNIT) CAPS capsule Take 50,000 Units by mouth every 7 (seven) days.    ? ?No current facility-administered medications for this encounter.  ? ?Allergies  ?Allergen Reactions  ? Penicillins   ? ? ?Social History  ? ?Socioeconomic History  ? Marital status: Single  ?  Spouse name: Not on file  ? Number of children: Not on file  ? Years of education: Not on file  ? Highest education level: Not on file  ?Occupational History  ? Occupation:  Animal nutritionist  ?Tobacco Use  ? Smoking status: Never  ? Smokeless tobacco: Never  ?Vaping Use  ? Vaping Use: Never used  ?Substance and Sexual Activity  ? Alcohol use: No  ? Drug use: No  ? Sexual activity: Yes  ?Other Topics Concern  ? Not on file  ?Social History Narrative  ? 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 Lives in [place]  ?   ?   ?   ? ?Social Determinants of Health  ? ?Financial Resource Strain: Not on file  ?Food Insecurity: Not on file  ?Transportation Needs: Not on file  ?Physical Activity: Not on file  ?Stress: Not on file  ?Social Connections: Not on file  ?Intimate Partner Violence: Not on file  ? ?Family History  ?Problem Relation Age of Onset  ? Cancer Mother   ? Cirrhosis Father   ?     cirrhosis of the liver; alcohol related  ? Diabetes Sister   ? Other Other   ?     neice was on allergy vaccine when young  ? Colon cancer Neg Hx   ? ?BP 140/80   Pulse 73   Wt 103.7 kg (228 lb 9.6 oz)   SpO2 99%   BMI 33.76 kg/m?  ? ?Wt Readings from Last 3 Encounters:  ?11/07/21 103.7 kg (228 lb 9.6 oz)  ?09/24/21 103.1 kg (227 lb 6.4 oz)  ?09/17/21 104.1 kg (229 lb 6.4 oz)  ? ?PHYSICAL EXAM: ?General:  Well appearing. No resp difficulty ?HEENT: normal ?Neck: supple. no JVD. Carotids 2+ bilat; no bruits. No lymphadenopathy or thryomegaly appreciated. ?Cor: PMI nondisplaced. Regular rate & rhythm. No rubs, gallops or murmurs. ?Lungs: clear ?Abdomen: obese soft, nontender, nondistended. No hepatosplenomegaly. No bruits or masses. Good bowel sounds. ?Extremities: no cyanosis, clubbing, rash, tr edema + compression hose ?Neuro: alert & orientedx3, cranial nerves grossly intact. moves all 4 extremities w/o difficulty. Affect pleasant ? ? ?ASSESSMENT & PLAN: ? ?1. CAD ?- Cath 11/22 with moderate non-obstructive CAD  ?- No s/s angina ?- Continue atorva + ASA. ?- Lipids managed by PCP ? ?2. Chronic HFpEF ?- Echo 05/15/21 EF 65-70% RV normal ?- Cath 11/22 normal filling pressures. May have component of venous  insufficiency. ?- NYHA I-II, volume looks good today. ?- Continue Farxiga 10 mg daily.  ?- Continue Entresto 24/26 mg bid. ?- Continue carvedilol 6.25 mg bid. ?- Encouraged compression hose. ?- Consider PYP in future. ? ?3.Syncope  ?- Had syncopal episode 05/14/21 . Unclear etiology.  ?- Evaluated in the ED ?- Carotid Dopplers negative. Echo EF 65-70%  ?- Zio 11/22 No significant dysrhythmias.  ?- No driving  x 6 months (~5/23) ?- No recurrence ? ?4. HTN  ?- BP elevated today. ?- Better control on home readings. ?- Continue to check BP at home and log. Bring readings to next visit. ? ?5. DMII ?- Continue Farxiga ? ? ?Glori Bickers, MD  ?2:31 PM ?

## 2021-11-08 ENCOUNTER — Other Ambulatory Visit: Payer: Self-pay

## 2021-11-08 ENCOUNTER — Emergency Department (HOSPITAL_COMMUNITY)
Admission: EM | Admit: 2021-11-08 | Discharge: 2021-11-08 | Disposition: A | Discharge status: Home / Self Care | Payer: BC Managed Care – PPO | Attending: Emergency Medicine | Admitting: Emergency Medicine

## 2021-11-08 ENCOUNTER — Encounter (HOSPITAL_COMMUNITY): Payer: Self-pay | Admitting: Emergency Medicine

## 2021-11-08 ENCOUNTER — Emergency Department (HOSPITAL_COMMUNITY): Payer: BC Managed Care – PPO

## 2021-11-08 DIAGNOSIS — Z7984 Long term (current) use of oral hypoglycemic drugs: Secondary | ICD-10-CM | POA: Insufficient documentation

## 2021-11-08 DIAGNOSIS — Z7982 Long term (current) use of aspirin: Secondary | ICD-10-CM | POA: Insufficient documentation

## 2021-11-08 DIAGNOSIS — Z79899 Other long term (current) drug therapy: Secondary | ICD-10-CM | POA: Diagnosis not present

## 2021-11-08 DIAGNOSIS — E119 Type 2 diabetes mellitus without complications: Secondary | ICD-10-CM | POA: Diagnosis not present

## 2021-11-08 DIAGNOSIS — R0789 Other chest pain: Secondary | ICD-10-CM | POA: Insufficient documentation

## 2021-11-08 DIAGNOSIS — I10 Essential (primary) hypertension: Secondary | ICD-10-CM | POA: Insufficient documentation

## 2021-11-08 DIAGNOSIS — R079 Chest pain, unspecified: Secondary | ICD-10-CM | POA: Diagnosis not present

## 2021-11-08 DIAGNOSIS — R072 Precordial pain: Secondary | ICD-10-CM | POA: Diagnosis not present

## 2021-11-08 LAB — TROPONIN I (HIGH SENSITIVITY): Troponin I (High Sensitivity): 11 ng/L (ref <18)

## 2021-11-08 LAB — CBC
HCT: 31.8 % — ABNORMAL LOW (ref 39.0–52.0)
Hemoglobin: 10.4 g/dL — ABNORMAL LOW (ref 13.0–17.0)
MCH: 31.2 pg (ref 26.0–34.0)
MCHC: 32.7 g/dL (ref 30.0–36.0)
MCV: 95.5 fL (ref 80.0–100.0)
Platelets: 225 10*3/uL (ref 150–400)
RBC: 3.33 MIL/uL — ABNORMAL LOW (ref 4.22–5.81)
RDW: 15.6 % — ABNORMAL HIGH (ref 11.5–15.5)
WBC: 4.1 10*3/uL (ref 4.0–10.5)
nRBC: 0 % (ref 0.0–0.2)

## 2021-11-08 LAB — BASIC METABOLIC PANEL
Anion gap: 9 (ref 5–15)
BUN: 14 mg/dL (ref 8–23)
CO2: 25 mmol/L (ref 22–32)
Calcium: 9.2 mg/dL (ref 8.9–10.3)
Chloride: 104 mmol/L (ref 98–111)
Creatinine, Ser: 0.96 mg/dL (ref 0.61–1.24)
GFR, Estimated: >60 mL/min (ref >60)
Glucose, Bld: 134 mg/dL — ABNORMAL HIGH (ref 70–99)
Potassium: 3.5 mmol/L (ref 3.5–5.1)
Sodium: 138 mmol/L (ref 135–145)

## 2021-11-08 IMAGING — CR DG CHEST 2V
2 series · 2 of 2 positions shown · non-contrast
Comparison: [DATE] prior radiographs.  [DATE] chest CT

CLINICAL DATA: Chest pain

EXAM:
CHEST - 2 VIEW

[chest pa]
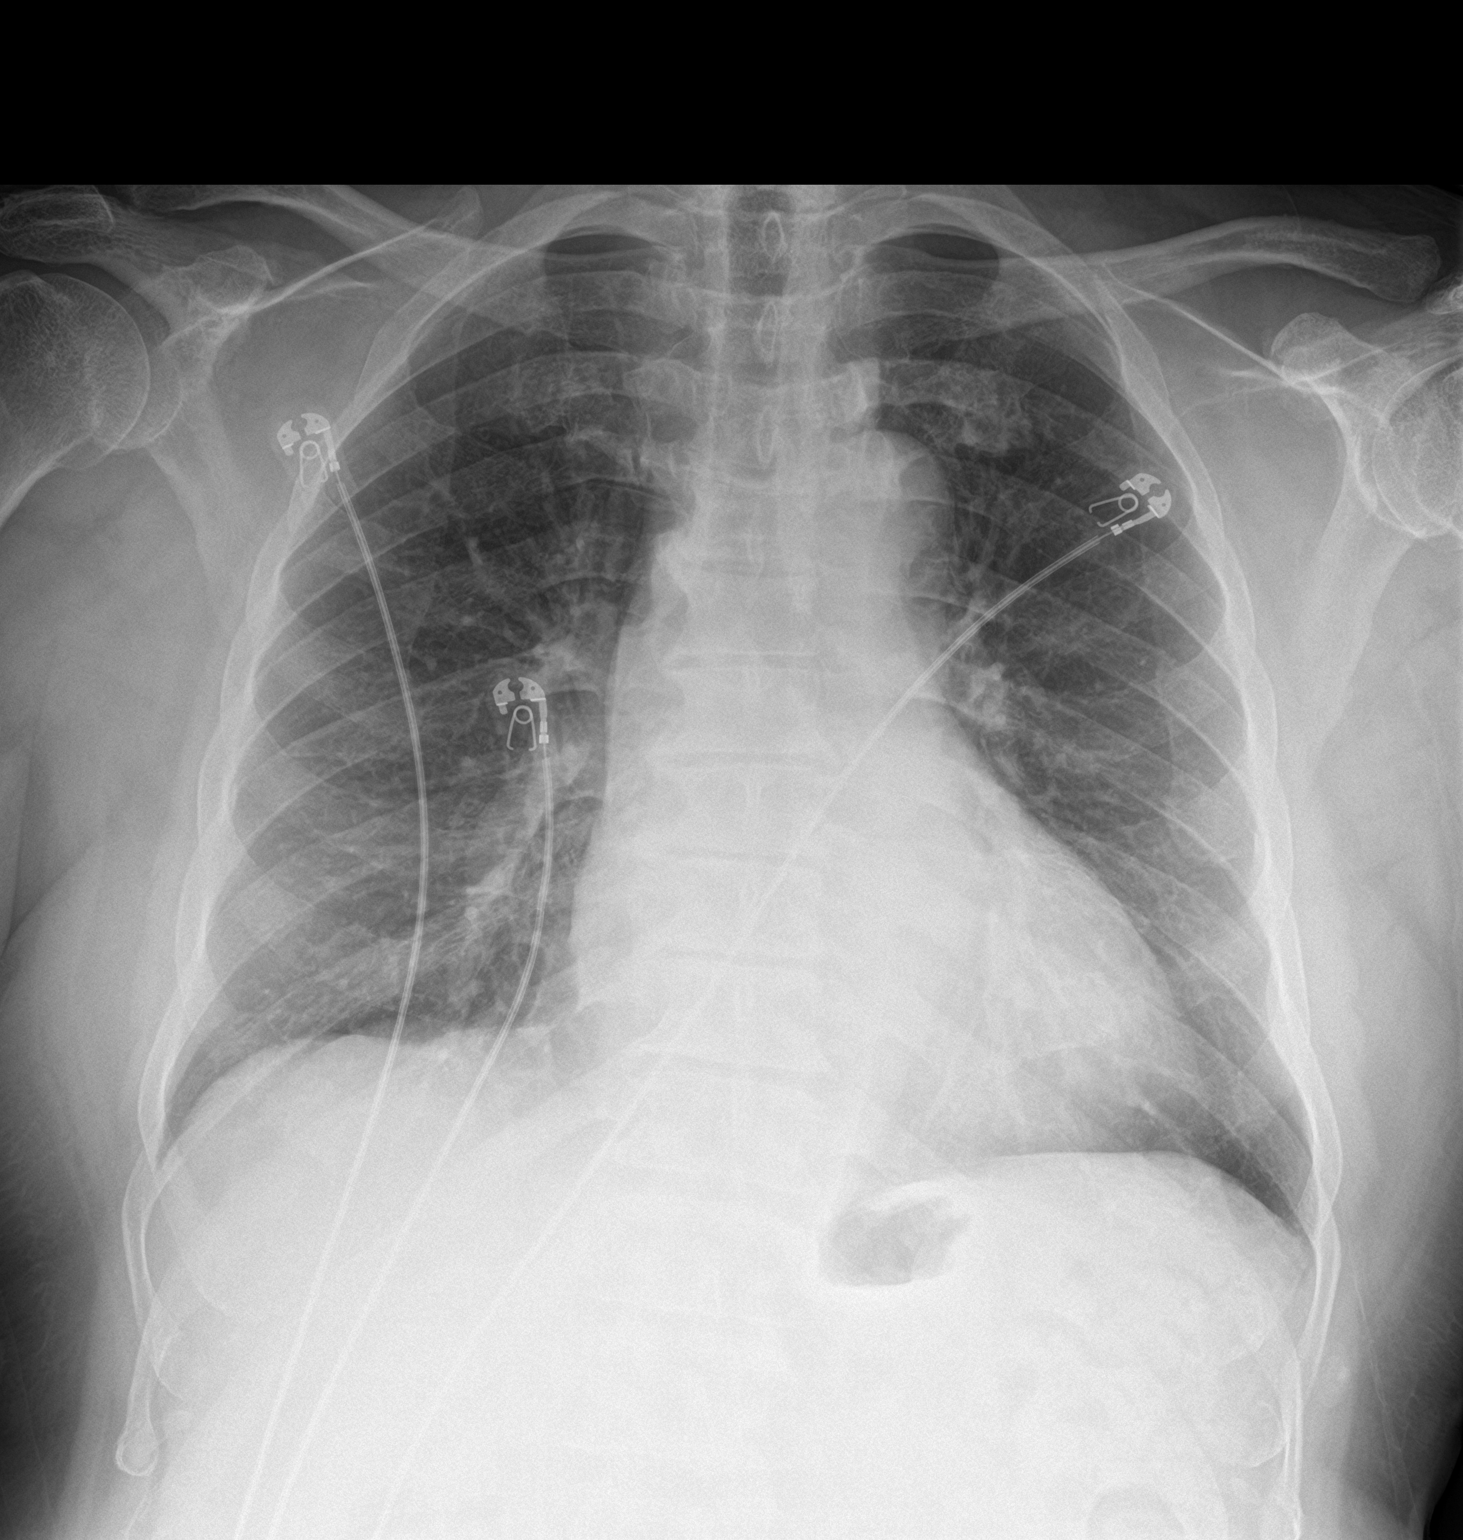

[chest lat]
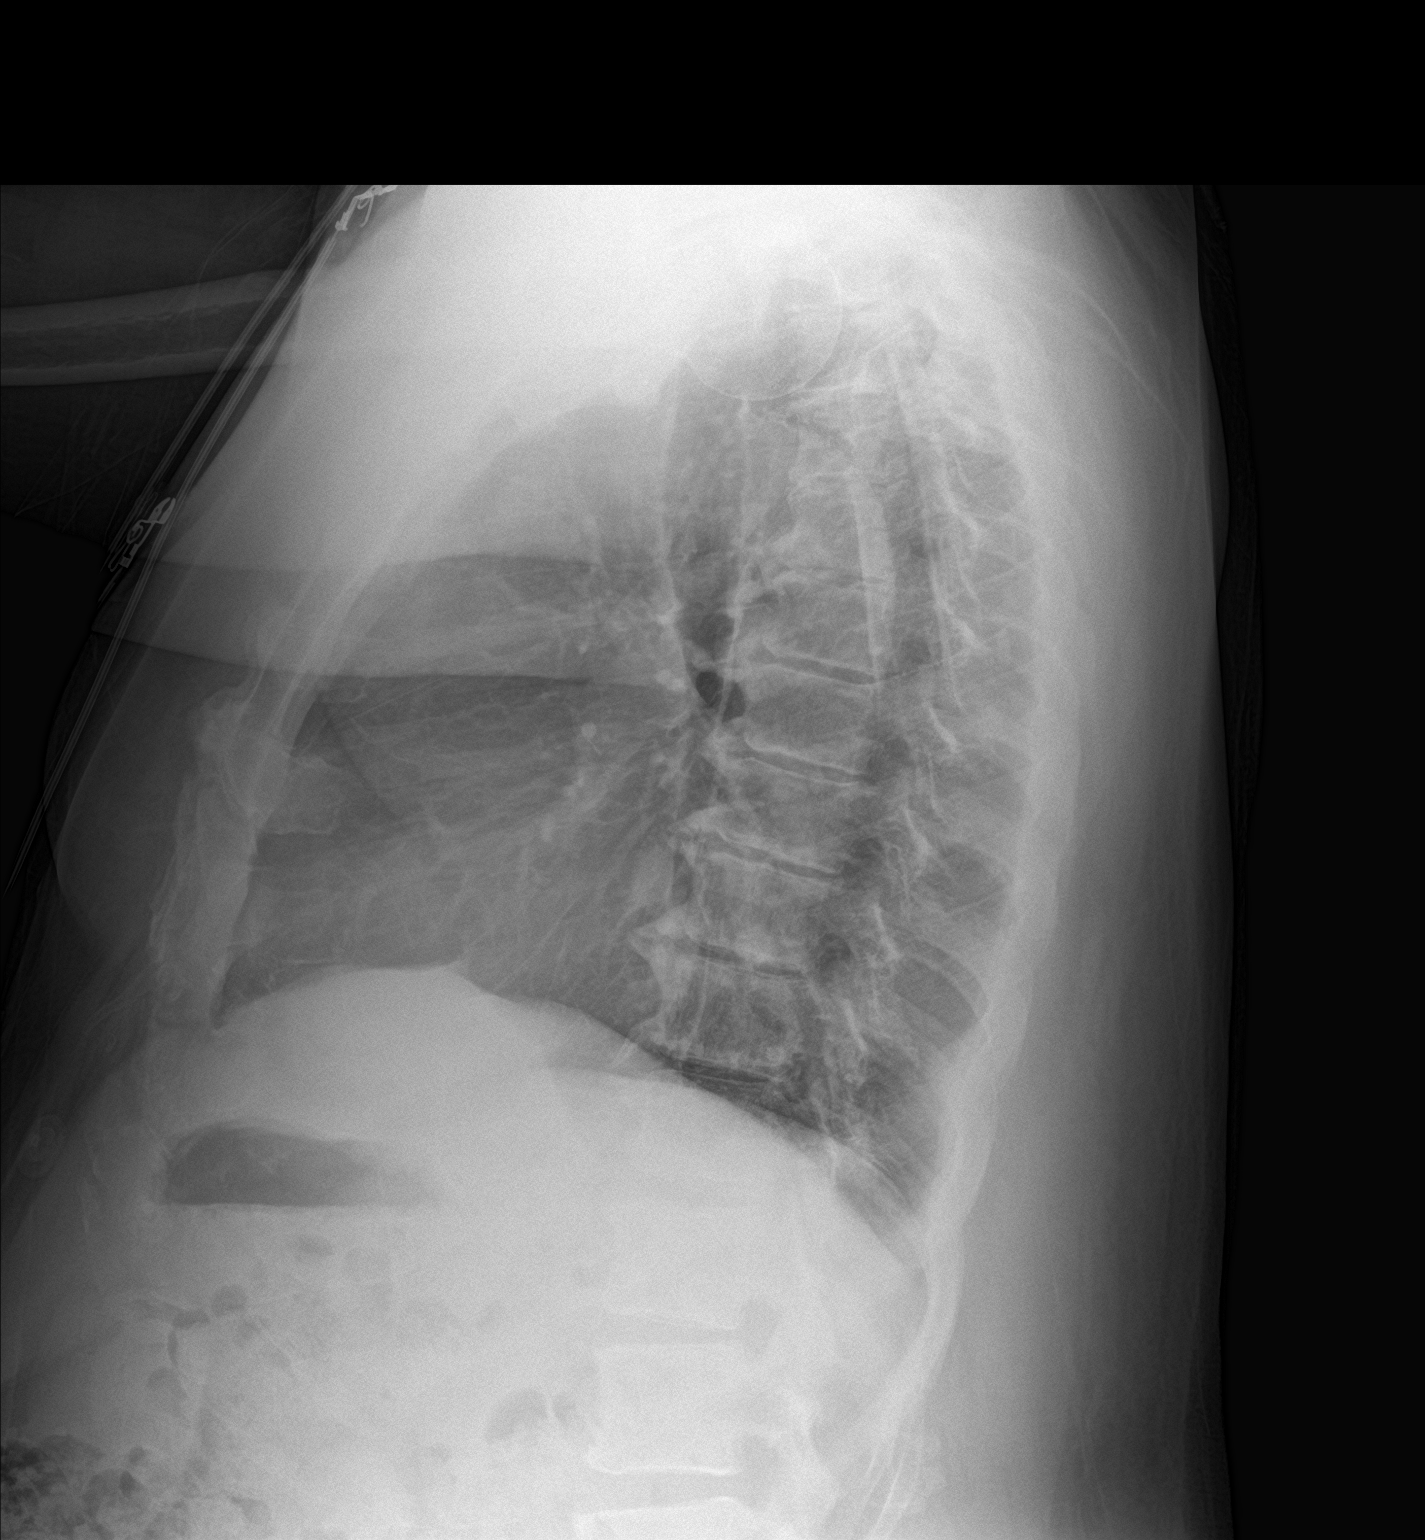

[2 of 2 positions shown; findings below may reference images not displayed]

FINDINGS: The cardiomediastinal silhouette is unremarkable.

There is no evidence of focal airspace disease, pulmonary edema,
suspicious pulmonary nodule/mass, pleural effusion, or pneumothorax.

Small nodules identified on recent chest CT are difficult to
visualize on this study.

No acute bony abnormalities are identified.
IMPRESSION: No evidence of acute cardiopulmonary disease.

## 2021-11-08 MED ORDER — ASPIRIN 325 MG PO TABS
325.0000 mg | ORAL_TABLET | Freq: Every day | ORAL | Status: DC
  Administered 2021-11-08: 325 mg via ORAL
  Filled 2021-11-08: qty 1

## 2021-11-08 MED ORDER — ALUM & MAG HYDROXIDE-SIMETH 200-200-20 MG/5ML PO SUSP
15.0000 mL | Freq: Once | ORAL | Status: AC
  Administered 2021-11-08: 15 mL via ORAL
  Filled 2021-11-08: qty 30

## 2021-11-08 NOTE — ED Triage Notes (Signed)
C/o L sided chest pain since last night.  Denies sob, nausea, vomiting, or any other associated symptoms. ?

## 2021-11-08 NOTE — ED Provider Notes (Signed)
?[facility] ?Provider Note ? ? ?CSN: [id number] ?Arrival date & time: 11/08/21  1445 ? ?  ? ?History ? ?Chief Complaint  ?Patient presents with  ? Chest Pain  ? ? ?Andrew Arnold is a 66 y.o. male. ? ?Patient a 66 year old male with past medical history of diabetes, hypertension, and hyperlipidemia presenting for complaints of chest pain.  Patient admits of left sternal chest pain without radiation that started last night after eating.  States pain has been persistent.  Denies any diaphoresis, nausea, vomiting.  Denies any breathing or shortness of breath.  Denies any recent URI symptoms. ? ?The history is provided by the patient. No language interpreter was used.  ?Chest Pain ?Associated symptoms: no abdominal pain, no back pain, no cough, no fever, no palpitations, no shortness of breath and no vomiting   ? ?  ? ?Home Medications ?Prior to Admission medications   ?Medication Sig Start Date End Date Taking? Authorizing Provider  ?amLODipine (NORVASC) 10 MG tablet Take 1 tablet (10 mg total) by mouth daily. 08/06/21   Thurnell Lose, MD  ?ascorbic acid (VITAMIN C) 500 MG tablet Take 500 mg by mouth every morning.     [provider]  ?aspirin 81 MG chewable tablet Chew 1 tablet (81 mg total) by mouth daily. 08/06/21   Thurnell Lose, MD  ?atorvastatin (LIPITOR) 80 MG tablet Take 80 mg by mouth at bedtime.    [provider]  ?B Complex Vitamins (B COMPLEX 100 PO) Take 1 tablet by mouth every morning.     [provider]  ?carvedilol (COREG) 6.25 MG tablet Take 1 tablet (6.25 mg total) by mouth 2 (two) times daily with a meal. 08/05/21   Thurnell Lose, MD  ?cetirizine (ZYRTEC) 10 MG tablet Take 10 mg by mouth in the morning.    [provider]  ?dapagliflozin propanediol (FARXIGA) 10 MG TABS tablet Take 1 tablet (10 mg total) by mouth daily before breakfast. 09/24/21   Milford, Maricela Bo, FNP  ?metFORMIN (GLUCOPHAGE) 1000 MG tablet  Take 1,000 mg by mouth 2 (two) times daily. 03/21/21   [provider]  ?pantoprazole (PROTONIX) 40 MG tablet Take 40 mg by mouth daily.    [provider]  ?potassium chloride SA (KLOR-CON M20) 20 MEQ tablet Take 1 tablet (20 mEq total) by mouth daily. 08/14/21 11/12/21  Rafael Bihari, FNP  ?sacubitril-valsartan (ENTRESTO) 24-26 MG Take 1 tablet by mouth 2 (two) times daily. 08/13/21   Bensimhon, Shaune Pascal, MD  ?spironolactone (ALDACTONE) 25 MG tablet Take 0.5 tablets (12.5 mg total) by mouth daily. 11/07/21   Bensimhon, Shaune Pascal, MD  ?tamsulosin (FLOMAX) 0.4 MG CAPS capsule Take 1 capsule (0.4 mg total) by mouth daily. 04/10/21   Irine Seal, MD  ?Vitamin D, Ergocalciferol, (DRISDOL) 1.25 MG (50000 UNIT) CAPS capsule Take 50,000 Units by mouth every 7 (seven) days.    [provider]  ?   ? ?Allergies    ?Penicillins   ? ?Review of Systems   ?Review of Systems  ?Constitutional:  Negative for chills and fever.  ?HENT:  Negative for ear pain and sore throat.   ?Eyes:  Negative for pain and visual disturbance.  ?Respiratory:  Negative for cough and shortness of breath.   ?Cardiovascular:  Positive for chest pain. Negative for palpitations.  ?Gastrointestinal:  Negative for abdominal pain and vomiting.  ?Genitourinary:  Negative for dysuria and hematuria.  ?Musculoskeletal:  Negative for arthralgias and back  pain.  ?Skin:  Negative for color change and rash.  ?Neurological:  Negative for seizures and syncope.  ?All other systems reviewed and are negative. ? ?Physical Exam ?Updated Vital Signs ?BP (!) 127/110   Pulse 78   Temp 99.1 ?F (37.3 ?C) (Oral)   Resp 19   SpO2 99%  ?Physical Exam ?Vitals and nursing note reviewed.  ?Constitutional:   ?   General: He is not in acute distress. ?   Appearance: He is well-developed.  ?HENT:  ?   Head: Normocephalic and atraumatic.  ?Eyes:  ?   Conjunctiva/sclera: Conjunctivae normal.  ?Cardiovascular:  ?   Rate and Rhythm: Normal rate and regular rhythm.  ?    Heart sounds: No murmur heard. ?Pulmonary:  ?   Effort: Pulmonary effort is normal. No respiratory distress.  ?   Breath sounds: Normal breath sounds.  ?Abdominal:  ?   Palpations: Abdomen is soft.  ?   Tenderness: There is no abdominal tenderness.  ?Musculoskeletal:     ?   General: No swelling.  ?   Cervical back: Neck supple.  ?Skin: ?   General: Skin is warm and dry.  ?   Capillary Refill: Capillary refill takes less than 2 seconds.  ?   Findings: No rash.  ?Neurological:  ?   Mental Status: He is alert.  ?Psychiatric:     ?   Mood and Affect: Mood normal.  ? ? ?ED Results / Procedures / Treatments   ?Labs ?(all labs ordered are listed, but only abnormal results are displayed) ?Labs Reviewed  ?BASIC METABOLIC PANEL - Abnormal; Notable for the following components:  ?    Result Value  ? Glucose, Bld 134 (*)   ? All other components within normal limits  ?CBC - Abnormal; Notable for the following components:  ? RBC 3.33 (*)   ? Hemoglobin 10.4 (*)   ? HCT 31.8 (*)   ? RDW 15.6 (*)   ? All other components within normal limits  ?TROPONIN I (HIGH SENSITIVITY)  ?TROPONIN I (HIGH SENSITIVITY)  ? ? ?EKG ?EKG Interpretation ? ?Date/Time:  Saturday November 08 2021 14:58:01 EDT ?Ventricular Rate:  73 ?PR Interval:  164 ?QRS Duration: 154 ?QT Interval:  436 ?QTC Calculation: 480 ?R Axis:   -60 ?Text Interpretation: Normal sinus rhythm Left axis deviation Left bundle branch block Abnormal ECG When compared with ECG of 13-Aug-2021 14:54, PREVIOUS ECG IS PRESENT Confirmed by Campbell Stall ([id number]) on 3/53/6144 3:50:09 PM ? ?Radiology ?DG Chest 2 View ? ?Result Date: 11/08/2021 ?CLINICAL DATA:  Chest pain EXAM: CHEST - 2 VIEW COMPARISON:  08/01/2021 prior radiographs.  09/10/2021 chest CT FINDINGS: The cardiomediastinal silhouette is unremarkable. There is no evidence of focal airspace disease, pulmonary edema, suspicious pulmonary nodule/mass, pleural effusion, or pneumothorax. Small nodules identified on recent chest CT are  difficult to visualize on this study. No acute bony abnormalities are identified. IMPRESSION: No evidence of acute cardiopulmonary disease. Electronically Signed   By: Margarette Canada M.D.   On: 11/08/2021 16:34   ? ?Procedures ?Procedures  ? ? ?Medications Ordered in ED ?Medications  ?aspirin tablet 325 mg (325 mg Oral Given 11/08/21 1652)  ? ? ?ED Course/ Medical Decision Making/ A&P ?  ?                        ?Medical Decision Making ?Amount and/or Complexity of Data Reviewed ?Labs: ordered. ?Radiology: ordered. ? ?Risk ?OTC drugs. ? ? ?6:11 PM ?  Patient a 66 year old male with past medical history of diabetes, hypertension, and hyperlipidemia presenting for complaints of chest pain.   ? ?The patient's chest pain is not suggestive of pulmonary embolus, cardiac ischemia, aortic dissection, pericarditis, myocarditis, pulmonary embolism, pneumothorax, pneumonia, Zoster, or esophageal perforation, or other serious etiology.  Historically not abrupt in onset, tearing or ripping, pulses symmetric. EKG nonspecific for ischemia/infarction. No dysrhythmias, brugada, WPW, prolonged QT noted. [CXR reviewed and WNL.  Troponin collected approximately 10 hours after chest pain onset and within normal limits.  No need for delta/2nd troponin.  CXR reviewed. Labs without demonstration of acute pathology unless otherwise noted above.  ? ?Heart Score 5. Recommended for close follow up with established cardiologist.  ? ?Patient in no distress and overall condition improved here in the ED. Detailed discussions were had with the patient regarding current findings, and need for close f/u with PCP or on call doctor. The patient has been instructed to return immediately if the symptoms worsen in any way for re-evaluation. Patient verbalized understanding and is in agreement with current care plan. All questions answered prior to discharge. . ? ? ? ? ? ? ? ?Final Clinical Impression(s) / ED Diagnoses ?Final diagnoses:  ?Chest wall pain   ? ? ?Rx / DC Orders ?ED Discharge Orders   ? ? None  ? ?  ? ? ?  ?Lianne Cure, DO ?37/34/28 1811 ? ?

## 2021-11-13 ENCOUNTER — Encounter: Payer: Self-pay | Admitting: Urology

## 2021-11-13 ENCOUNTER — Ambulatory Visit: Payer: BC Managed Care – PPO | Admitting: Urology

## 2021-11-13 ENCOUNTER — Ambulatory Visit (INDEPENDENT_AMBULATORY_CARE_PROVIDER_SITE_OTHER): Payer: BC Managed Care – PPO | Admitting: Urology

## 2021-11-13 VITALS — BP 123/76 | HR 74

## 2021-11-13 DIAGNOSIS — N3941 Urge incontinence: Secondary | ICD-10-CM | POA: Diagnosis not present

## 2021-11-13 DIAGNOSIS — R351 Nocturia: Secondary | ICD-10-CM | POA: Diagnosis not present

## 2021-11-13 DIAGNOSIS — R972 Elevated prostate specific antigen [PSA]: Secondary | ICD-10-CM | POA: Diagnosis not present

## 2021-11-13 DIAGNOSIS — N401 Enlarged prostate with lower urinary tract symptoms: Secondary | ICD-10-CM | POA: Diagnosis not present

## 2021-11-13 DIAGNOSIS — R350 Frequency of micturition: Secondary | ICD-10-CM

## 2021-11-13 LAB — URINALYSIS, ROUTINE W REFLEX MICROSCOPIC
Bilirubin, UA: NEGATIVE
Leukocytes,UA: NEGATIVE
Nitrite, UA: NEGATIVE
RBC, UA: NEGATIVE
Specific Gravity, UA: 1.02 (ref 1.005–1.030)
Urobilinogen, Ur: 0.2 mg/dL (ref 0.2–1.0)
pH, UA: 5.5 (ref 5.0–7.5)

## 2021-11-13 MED ORDER — TAMSULOSIN HCL 0.4 MG PO CAPS: 0.4000 mg | ORAL_CAPSULE | Freq: Every day | ORAL | 3 refills | Status: DC

## 2021-11-13 NOTE — Progress Notes (Signed)
?Subjective: ?1. Benign prostatic hyperplasia with urinary frequency   ?2. Nocturia   ?3. Urge incontinence   ?4. Elevated PSA   ?  ? ?04/10/21: Andrew Arnold is a 66 yo male who is sent for evaluation of voiding symptoms.   He has about a 3 month history  frequency q1-2hrs but his main compaint is nocturia 3+ x nightly.  He has some urgency and with incontinence and is wearing depends.  He has a good stream and feels he empties.   He has no dysuria and no hematuria.  He has no other GU surgery.  His symptoms are not impacted by the HCTZ.  He has not been treated for his symptoms.  He had a PSA that was 3 in 6/20.   He has some RUE numbness and RLE weakness for 3 months and is seeing neurology for that.  He has some dizziness in the AM.  He had a normal MRI of the head on 04/01/21.   He had some lumbar DDD on a recent spine series.  His UA has 0-2 RBC's and his PVR is 66m.   ? ?05/01/21: Andrew Arnold today in f/u.   He was given tamsulosin for his LUTS.  He still has nocturia x 3 and reports frequency every 2 hours and he has urgency.  He wears depends but is generally able to get to the bathroom before leaking.   His IPSS today is only 3.   His PVR was 478m   He had  Cervical MRI on 10/17 that shows cervical spinal stenosis and DDD.   He had a repeat PSA on 04/10/21 and it was elevated at 5.5 with a 15.5 % f/t ratio.   ? ?08/07/21: Andrew Arnold today in f/u.   He was in the ER on 07/30/21 and treated for pneumonia but was then hospitalized with CP on 08/01/21.  He was discharge with with keflex on 08/05/21.  He remains on tamsulosin.  He is voiding better with an IPSS of 2 from nocturia.  He was supposed to have a prostate biopsy but has had some surgery for a cervical disc on 06/03/21.  He was admitted for CHF prior to that in early November after a syncopal episode.  He had a CT AP on 08/02/21 and his prostate is about 11648mith a middle lobe.  ? ?11/13/21: Andrew Arnold today in f/u for his history of BPH with BOO  and an elevated PSA.  He has a 116m67mostate with a middle lobe.  His PSA on 11/05/21 was 5.6 with an 18.8% f/t ratio.  It was 5.5 on 04/10/21.   He had previously been scheduled for a biopsy but had  a Cervical disc surgery that interrupted the plan.  He had issues with incontinence but that had resolved with tamsulosin.  He has some nocturia but that can be associated with his evening fluid intake.  His IPSS is 5.  With nocturia x 3.   He was in the ED on 11/08/21 with chest pain and he was sent home with the presumptive diagnosis of heartburn.  HIs UA is clear.  ? ? IPSS   ? ? Andrew Arnold 11/13/21 1400  ?  ?  ?  ? International Prostate Symptom Score  ? How often have you had the sensation of not emptying your bladder? Not at All    ? How often have you had to urinate less than every two hours? Less than 1 in 5 times    ?  How often have you found you stopped and started again several times when you urinated? Less than 1 in 5 times    ? How often have you found it difficult to postpone urination? Not at All    ? How often have you had a weak urinary stream? Not at All    ? How often have you had to strain to start urination? Not at All    ? How many times did you typically get up at night to urinate? 3 Times    ? Total IPSS Score 5    ?  ? Quality of Life due to urinary symptoms  ? If you were to spend the rest of your life with your urinary condition just the way it is now how would you feel about that? Pleased    ? ?  ?  ? ?  ? ? ? ?ROS: ? ?Review of Systems  ?All other systems reviewed and are negative. ? ?Allergies  ?Allergen Reactions  ? Penicillins   ? ? ?Past Medical History:  ?Diagnosis Date  ? Allergic rhinitis due to pollen   ? on allergy shots  ? Arrhythmia   ? 1980s hospitalized for HTN and ?irregular rhythm but no specific dx but resolved during hospitalization  ? Diabetes mellitus without complication ([place])   ? HTN (hypertension)   ? Hyperlipidemia   ? ? ?Past Surgical History:  ?Procedure Laterality Date   ? ANTERIOR CERVICAL DECOMP/DISCECTOMY FUSION N/A 06/03/2021  ? Procedure: Anterior Cervical Decompression Fusion  Cervical three-four;  Surgeon: Vallarie Mare, MD;  Location: [facility];  Service: Neurosurgery;  Laterality: N/A;  ? colonoscopy  2005  ? Dr. Gala Romney: normal rectum, sigmoid diverticula in colonic mucosa  ? COLONOSCOPY N/A 02/15/2014  ? Procedure: COLONOSCOPY;  Surgeon: Daneil Dolin, MD;  [facility];  Service: Endoscopy;  Laterality: N/A;  2:00  ? RIGHT/LEFT HEART CATH AND CORONARY ANGIOGRAPHY N/A 05/23/2021  ? Procedure: RIGHT/LEFT HEART CATH AND CORONARY ANGIOGRAPHY;  Surgeon: Jolaine Artist, MD;  Location: [facility];  Service: Cardiovascular;  Laterality: N/A;  ? ? ?Social History  ? ?Socioeconomic History  ? Marital status: Single  ?  Spouse name: Not on file  ? Number of children: Not on file  ? Years of education: Not on file  ? Highest education level: Not on file  ?Occupational History  ? Occupation: Animal nutritionist  ?Tobacco Use  ? Smoking status: Never  ? Smokeless tobacco: Never  ?Vaping Use  ? Vaping Use: Never used  ?Substance and Sexual Activity  ? Alcohol use: No  ? Drug use: No  ? Sexual activity: Yes  ?Other Topics Concern  ? Not on file  ?Social History Narrative  ? 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 Lives in [place]  ?   ?   ?   ? ?Social Determinants of Health  ? ?Financial Resource Strain: Not on file  ?Food Insecurity: Not on file  ?Transportation Needs: Not on file  ?Physical Activity: Not on file  ?Stress: Not on file  ?Social Connections: Not on file  ?Intimate Partner Violence: Not on file  ? ? ?Family History  ?Problem Relation Age of Onset  ? Cancer Mother   ? Cirrhosis Father   ?     cirrhosis of the liver; alcohol related  ? Diabetes Sister   ? Other Other   ?     neice was on allergy vaccine when young  ? Colon  cancer Neg Hx   ? ? ?Anti-infectives: ?Anti-infectives (From admission, onward)  ? ? None  ? ?  ? ? ?Current Outpatient Medications   ?Medication Sig Dispense Refill  ? amLODipine (NORVASC) 10 MG tablet Take 1 tablet (10 mg total) by mouth daily. 30 tablet 0  ? ascorbic acid (VITAMIN C) 500 MG tablet Take 500 mg by mouth every morning.     ? aspirin 81 MG chewable tablet Chew 1 tablet (81 mg total) by mouth daily. 30 tablet 0  ? atorvastatin (LIPITOR) 80 MG tablet Take 80 mg by mouth at bedtime.    ? B Complex Vitamins (B COMPLEX 100 PO) Take 1 tablet by mouth every morning.     ? carvedilol (COREG) 6.25 MG tablet Take 1 tablet (6.25 mg total) by mouth 2 (two) times daily with a meal. 60 tablet 0  ? cetirizine (ZYRTEC) 10 MG tablet Take 10 mg by mouth in the morning.    ? Cholecalciferol (VITAMIN D3) 1.25 MG (50000 UT) CAPS Take 1 capsule by mouth once a week.    ? dapagliflozin propanediol (FARXIGA) 10 MG TABS tablet Take 1 tablet (10 mg total) by mouth daily before breakfast. 90 tablet 3  ? furosemide (LASIX) 20 MG tablet Take 20 mg by mouth daily.    ? metFORMIN (GLUCOPHAGE) 1000 MG tablet Take 1,000 mg by mouth 2 (two) times daily.    ? pantoprazole (PROTONIX) 40 MG tablet Take 40 mg by mouth daily.    ? sacubitril-valsartan (ENTRESTO) 24-26 MG Take 1 tablet by mouth 2 (two) times daily. 180 tablet 3  ? spironolactone (ALDACTONE) 25 MG tablet Take 0.5 tablets (12.5 mg total) by mouth daily. 15 tablet 6  ? Vitamin D, Ergocalciferol, (DRISDOL) 1.25 MG (50000 UNIT) CAPS capsule Take 50,000 Units by mouth every 7 (seven) days.    ? potassium chloride SA (KLOR-CON M20) 20 MEQ tablet Take 1 tablet (20 mEq total) by mouth daily. 90 tablet 3  ? tamsulosin (FLOMAX) 0.4 MG CAPS capsule Take 1 capsule (0.4 mg total) by mouth daily. 90 capsule 3  ? ?No current facility-administered medications for this visit.  ? ? ? ?Objective: ?Vital signs in last 24 hours: ?BP 123/76   Pulse 74  ? ?Intake/Output from previous day: ?No intake/output data recorded. ?Intake/Output this shift: ?'@IOTHISSHIFT'$ @ ? ? ?Physical Exam ?Vitals reviewed.  ?Constitutional:   ?    Appearance: Normal appearance.  ?Neurological:  ?   Mental Status: He is alert.  ? ? ?Lab Results:  ?Recent Results (from the past 2160 hour(s))  ?Basic Metabolic Panel (BMET)     Status: Abnormal  ? Collec

## 2021-11-18 ENCOUNTER — Ambulatory Visit (HOSPITAL_COMMUNITY): Payer: BC Managed Care – PPO | Attending: Internal Medicine

## 2021-11-18 DIAGNOSIS — R262 Difficulty in walking, not elsewhere classified: Secondary | ICD-10-CM | POA: Insufficient documentation

## 2021-11-18 DIAGNOSIS — M6281 Muscle weakness (generalized): Secondary | ICD-10-CM | POA: Diagnosis not present

## 2021-11-18 NOTE — Therapy (Signed)
?OUTPATIENT PHYSICAL THERAPY TREATMENT NOTE ? ? ?Patient Name: Andrew Arnold ?MRN: [id number] ?DOB:26-Sep-1955, 66 y.o., male ?Today's Date: 11/18/2021 ? ?PCP: [name], MD ?REFERRING PROVIDER: Thurnell Lose, MD ? ? PT End of Session - 11/18/21 1536   ? ? Visit Number 22   ? Number of Visits 28   ? Date for PT Re-Evaluation 11/29/21   ? Authorization Type BCBS   ? Authorization - Visit Number 21   ? Authorization - Number of Visits 30   ? Progress Note Due on Visit 20   ? PT Start Time 1532   ? PT Stop Time [id number]   ? PT Time Calculation (min) 49 min   ? Equipment Utilized During Treatment --   CGA or //bars  ? Activity Tolerance Patient tolerated treatment well   ? Behavior During Therapy [facility] for tasks assessed/performed   ? ?  ?  ? ?  ? ? ? ? ? ? ? ?Past Medical History:  ?Diagnosis Date  ? Allergic rhinitis due to pollen   ? on allergy shots  ? Arrhythmia   ? 1980s hospitalized for HTN and ?irregular rhythm but no specific dx but resolved during hospitalization  ? Diabetes mellitus without complication ([place])   ? HTN (hypertension)   ? Hyperlipidemia   ? ?Past Surgical History:  ?Procedure Laterality Date  ? ANTERIOR CERVICAL DECOMP/DISCECTOMY FUSION N/A 06/03/2021  ? Procedure: Anterior Cervical Decompression Fusion  Cervical three-four;  Surgeon: Vallarie Mare, MD;  Location: [place];  Service: Neurosurgery;  Laterality: N/A;  ? colonoscopy  2005  ? Dr. Gala Romney: normal rectum, sigmoid diverticula in colonic mucosa  ? COLONOSCOPY N/A 02/15/2014  ? Procedure: COLONOSCOPY;  Surgeon: Daneil Dolin, MD;  [facility];  Service: Endoscopy;  Laterality: N/A;  2:00  ? RIGHT/LEFT HEART CATH AND CORONARY ANGIOGRAPHY N/A 05/23/2021  ? Procedure: RIGHT/LEFT HEART CATH AND CORONARY ANGIOGRAPHY;  Surgeon: Jolaine Artist, MD;  Location: [place] [facility];  Service: Cardiovascular;  Laterality: N/A;  ? ?Patient Active Problem List  ? Diagnosis Date Noted  ? CAP (community acquired pneumonia)  08/01/2021  ? Nausea and vomiting 08/01/2021  ? DM2 (diabetes mellitus, type 2) ([place]) 08/01/2021  ? Coronary artery disease involving native coronary artery of native heart without angina pectoris 07/31/2021  ? Stenosis of cervical spine with myelopathy ([place]) 06/03/2021  ? Macrocytic anemia 05/15/2021  ? Bilateral lower extremity edema 05/15/2021  ? BPH (benign prostatic hyperplasia) 05/15/2021  ? Elevated PSA 05/15/2021  ? Cervical spinal stenosis 05/15/2021  ? DDD (degenerative disc disease), cervical 05/15/2021  ? Syncope 05/14/2021  ? Elevated hemoglobin A1c 04/10/2021  ? Chest pain 08/29/2017  ? Fever 08/29/2017  ? Acute URI 08/29/2017  ? Moderate dehydration 08/29/2017  ? Sepsis ([place]) 08/29/2017  ? Elevated lactic acid level 08/29/2017  ? Acute gastroenteritis 08/29/2017  ? Diarrhea 08/29/2017  ? GERD (gastroesophageal reflux disease) 08/29/2017  ? Positive D dimer 08/29/2017  ? Encounter for screening colonoscopy 02/01/2014  ? Orthostatic hypotension 01/15/2014  ? Dyslipidemia 08/14/2008  ? Essential hypertension 08/14/2008  ? Seasonal and perennial allergic rhinitis 04/23/2007  ? ? ?REFERRING DIAG: K52.9 (ICD-10-CM) - Acute gastroenteritis  ? ?THERAPY DIAG:  ?Muscle weakness (generalized) ? ?Difficulty in walking, not elsewhere classified ? ?PERTINENT HISTORY: ACDF, DM, HTN, lung nodular  ? ?PRECAUTIONS: None  ? ?SUBJECTIVE: no new issues today; walking with SPC ? ?PAIN:  ?Are you having pain? No ? ? ? ? ?TODAY'S TREATMENT:  ?11/18/21 ?  Power ups on 4 inch box with 3 sec holds x 15 each  ?Hip vectors 3" hold each 2 x 5 each ?Mini lunges onto 4 inch setp x 15 each without HHA  ?Leg press both legs 5 plates 3 x 15, Right leg only 3 plates 3 x 15 ?Weighted walkouts 4 plates x 5 each  ?STS to heel raise x 10  ?Nustep seat 10 level 3 x 5 min ? ? ? ?11/06/21 ?Nustep 5 min dynamic warmup lv3 ?Weighted walkouts 4 plates x 5 each  ?Power ups on 4 inch box with 3 sec holds x 15 each  ?Leg press both legs 5 plates 2 x 15,  Right leg only 3 plates 2 x 15 ?SLS vectors 5 sec holds  ?Mini lunges onto 4 inch setp x 15 each with HHA  ?  ?11/04/21 ?Nustep x 5' warm up ?Walkouts 3 plates x 5 each ?H/S curls 3 plates 3 x 15 reps ?Leg press both legs 5 plates 2 x 10, Right leg only 3 x 10 ?5# marching x 10 each leg ? ? ? ?10/30/21 ? Strength eval                       08/21/21 10/30/21  ?  Right Hip Flexion 2+/5  3+/5  ?  Right Hip Extension 2/5  2+/5  ?  Right Hip ABduction 2/5  3-/5  ?  Left Hip Flexion 4-/5  4/5  ?  Left Hip Extension 3-/5  3+/5  ?  Left Hip ABduction 3/5  3+/5  ?  Right Knee Flexion 2/5  3-/5  ?  Right Knee Extension 3/5  5/5  ?  Left Knee Flexion 3+/5  4+/5  ?  Left Knee Extension 5/5  5/5  ?  Right Ankle Dorsiflexion 3-/5  4-/5  ?  Right Ankle Plantar Flexion 2/5  2+/5  ?  Left Ankle Dorsiflexion 3-/5  4+/5  ?  Left Ankle Plantar Flexion 3/5  3+/5  ?  ?   ?  ?  ?SLS Right foot 5 sec, left foot 20 sec ? ? ?STS with red med ball in Ue's up to heel raise for balance 2 x 10 ? ?Bodycraft walkouts all direction 3PL 5RT with CGA for safety ?Leg press BLE 2x 10 5Pl; Rt LE only 3 Pl x 10 ?Hamstring curl 2 1/2 PL Rt Le only 2x 15 slow control ? ? ?PATIENT EDUCATION: ?Education details: on exercise form and function  ?Person educated: Patient ?Education method: Explanation and Demonstration ?Education comprehension: verbalized understanding and returned demonstration ? ? ?HOME EXERCISE PROGRAM: ?sitting ankle dorsi/plantarflexion, LAQ, bridge.; 2/23: heel raises, hip abd/ext; 2/27 - sidelying hip abd, clams, prone hip ext;3/7: heelraise, squat, step up , sit to stand, and Sidelying Hip Abduction - 2-3 x daily - 7 x weekly - 3 sets - 6 reps  ? ? PT Short Term Goals - from 10/30/21  ? ?  ? PT SHORT TERM GOAL #1  ? Title PT to be I in HEP to increase mm strength by 1/2 grade to allow pt to feel confident walking inside with a cane.   ? Time 3   ? Period Weeks   ? Status  Met walking with cane  ? Target Date 09/11/21   ?  ? PT SHORT TERM GOAL  #2  ? Title PT to be able to stand on his Rt LE for 15 seconds to reduce risk of falling   ?  Time 2  ? Period Weeks   ? Status On-going, currently 5 sec  ? ?  ?  ? ?  ? ? ? PT Long Term Goals - 10/30/21 1031   ? ?  ? PT LONG TERM GOAL #1  ? Title Pt to be completing an advance HEP to increase LE strength by one grade to allow pt to go up and down 8 steps in a reciprocal manner.   ? Time 4  ? Period Weeks   ? Status On-going   ? Target Date 10/30/21  ?  ? PT LONG TERM GOAL #2  ? Title PT to be able to balance on both legs for 20 seconds to decrease risk of falling so pt feels confident walking outside with a cane.   ? Time 4  ? Period Weeks  10/30/21  ? Status Partially met; Right leg 5 sec, Left 20 sec  ? ?  ?  ? ?  ? ?  Plan   ?  ?  Clinical Impression Statement Today's session continued to work on hip mobility and strengthening and dynamic standing balance.  He works hard in therapy; continues with a limp that is more pronounced as he fatigues but overall is progressing well towards set rehab goals. Performed lunged with hand held assistance to challenge balance today; occasionally has to touch to avoid loss of balance. Patient will benefit from continued skilled therapy interventions to address deficits and improve functional mobility.  ? ?  ?  Personal Factors and Comorbidities Comorbidity 2;Age;Fitness;Time since onset of injury/illness/exacerbation   ?  Comorbidities ACDF, DM, HTN   ?  Examination-Activity Limitations Stairs;Transfers;Lift;Locomotion Level   ?  Examination-Participation Restrictions Occupation;Meal Prep;Yard Work;Community Activity;Cleaning   ?  Stability/Clinical Decision Making Stable/Uncomplicated   ?  Rehab Potential Good   ?  PT Frequency 2x / week   ?  PT Duration 4 weeks   ?  PT Treatment/Interventions Patient/family education;Therapeutic activities;Stair training;Balance training;Neuromuscular re-education;Therapeutic exercise   ?  PT Next Visit Plan Continue to work on lower extremity  and core strengthening and dynamic balance.   ?  ? ?4:19 PM, 11/18/21 ?Olina Melfi Small Marten Iles MPT ?[facility] ?[place] [phone] ?Ph:[phone] ? ? ?

## 2021-11-21 ENCOUNTER — Encounter (HOSPITAL_COMMUNITY): Payer: Self-pay

## 2021-11-21 ENCOUNTER — Ambulatory Visit (HOSPITAL_COMMUNITY): Payer: BC Managed Care – PPO

## 2021-11-21 DIAGNOSIS — R262 Difficulty in walking, not elsewhere classified: Secondary | ICD-10-CM | POA: Diagnosis not present

## 2021-11-21 DIAGNOSIS — M6281 Muscle weakness (generalized): Secondary | ICD-10-CM | POA: Diagnosis not present

## 2021-11-21 NOTE — Therapy (Addendum)
?OUTPATIENT PHYSICAL THERAPY TREATMENT NOTE ? ? ?Patient Name: Andrew Arnold ?MRN: [id number] ?DOB:03-10-1956, 66 y.o., male ?Today's Date: 11/21/2021 ? ?PCP: [name], MD ?REFERRING PROVIDER: Thurnell Lose, MD ? ? PT End of Session - 11/21/21 1607   ? ? Visit Number 23   ? Number of Visits 28   ? Date for PT Re-Evaluation 11/29/21   ? Authorization Type BCBS   ? Authorization - Visit Number 22   ? Authorization - Number of Visits 30   ? Progress Note Due on Visit 20   ? PT Start Time 9518   ? PT Stop Time 8416   ? PT Time Calculation (min) 40 min   ? Activity Tolerance Patient tolerated treatment well   ? Behavior During Therapy [facility] for tasks assessed/performed   ? ?  ?  ? ?  ? ? ? ? ? ? ? ?Past Medical History:  ?Diagnosis Date  ? Allergic rhinitis due to pollen   ? on allergy shots  ? Arrhythmia   ? 1980s hospitalized for HTN and ?irregular rhythm but no specific dx but resolved during hospitalization  ? Diabetes mellitus without complication ([place])   ? HTN (hypertension)   ? Hyperlipidemia   ? ?Past Surgical History:  ?Procedure Laterality Date  ? ANTERIOR CERVICAL DECOMP/DISCECTOMY FUSION N/A 06/03/2021  ? Procedure: Anterior Cervical Decompression Fusion  Cervical three-four;  Surgeon: Vallarie Mare, MD;  Location: [place];  Service: Neurosurgery;  Laterality: N/A;  ? colonoscopy  2005  ? Dr. Gala Romney: normal rectum, sigmoid diverticula in colonic mucosa  ? COLONOSCOPY N/A 02/15/2014  ? Procedure: COLONOSCOPY;  Surgeon: Daneil Dolin, MD;  [facility];  Service: Endoscopy;  Laterality: N/A;  2:00  ? RIGHT/LEFT HEART CATH AND CORONARY ANGIOGRAPHY N/A 05/23/2021  ? Procedure: RIGHT/LEFT HEART CATH AND CORONARY ANGIOGRAPHY;  Surgeon: Jolaine Artist, MD;  Location: [place] [facility];  Service: Cardiovascular;  Laterality: N/A;  ? ?Patient Active Problem List  ? Diagnosis Date Noted  ? CAP (community acquired pneumonia) 08/01/2021  ? Nausea and vomiting 08/01/2021  ? DM2 (diabetes  mellitus, type 2) ([place]) 08/01/2021  ? Coronary artery disease involving native coronary artery of native heart without angina pectoris 07/31/2021  ? Stenosis of cervical spine with myelopathy ([place]) 06/03/2021  ? Macrocytic anemia 05/15/2021  ? Bilateral lower extremity edema 05/15/2021  ? BPH (benign prostatic hyperplasia) 05/15/2021  ? Elevated PSA 05/15/2021  ? Cervical spinal stenosis 05/15/2021  ? DDD (degenerative disc disease), cervical 05/15/2021  ? Syncope 05/14/2021  ? Elevated hemoglobin A1c 04/10/2021  ? Chest pain 08/29/2017  ? Fever 08/29/2017  ? Acute URI 08/29/2017  ? Moderate dehydration 08/29/2017  ? Sepsis ([place]) 08/29/2017  ? Elevated lactic acid level 08/29/2017  ? Acute gastroenteritis 08/29/2017  ? Diarrhea 08/29/2017  ? GERD (gastroesophageal reflux disease) 08/29/2017  ? Positive D dimer 08/29/2017  ? Encounter for screening colonoscopy 02/01/2014  ? Orthostatic hypotension 01/15/2014  ? Dyslipidemia 08/14/2008  ? Essential hypertension 08/14/2008  ? Seasonal and perennial allergic rhinitis 04/23/2007  ? ? ?REFERRING DIAG: K52.9 (ICD-10-CM) - Acute gastroenteritis  ? ?THERAPY DIAG:  ?Muscle weakness (generalized) ? ?Difficulty in walking, not elsewhere classified ? ?PERTINENT HISTORY: ACDF, DM, HTN, lung nodular  ? ?PRECAUTIONS: None  ? ?SUBJECTIVE: Patient reports no pain or concerns, states he is doing his HEP 2-3 times per week on days not a therapy, denies any fatigue or soreness from prior sessions ? ?PAIN:  ?Are you having  pain? No ? ? ? ? ?TODAY'S TREATMENT:  ?11/19/21 ?Warm up aerobic and endurance on Nustep seat 10 level 3 for 8 mins ?There-ex standing in // bars =  cue for light to no hands on bars as able ? - Heel raises 2 sets x 10 reps  ? - Toes raises 2 sets x 10 reps ? - Hip vectors of hip flexion, hip abduction, hip extension with 3 sec hold each 5 reps each x 3 rounds ?- first round no resistance, second and thrid round B verse red theraband PRE resistance today  ? - Step ups  onto 4 inch box x 20 B leading  ? - trial of side step ups onto 4 inch box x 20 B  ? -At bodycraft machine =  Leg press both legs 5 plates 3 sets x 15 reps, Right leg only 3 plates 3 x 15 ? ? ?09/16/62 ?Power ups on 4 inch box with 3 sec holds x 15 each  ?Hip vectors 3" hold each 2 x 5 each ?Mini lunges onto 4 inch setp x 15 each without HHA  ?Leg press both legs 5 plates 3 x 15, Right leg only 3 plates 3 x 15 ?Weighted walkouts 4 plates x 5 each  ?STS to heel raise x 10  ?Nustep seat 10 level 3 x 5 min ? ? ? ?11/06/21 ?Nustep 5 min dynamic warmup lv3 ?Weighted walkouts 4 plates x 5 each  ?Power ups on 4 inch box with 3 sec holds x 15 each  ?Leg press both legs 5 plates 2 x 15, Right leg only 3 plates 2 x 15 ?SLS vectors 5 sec holds  ?Mini lunges onto 4 inch setp x 15 each with HHA  ?  ?11/04/21 ?Nustep x 5' warm up ?Walkouts 3 plates x 5 each ?H/S curls 3 plates 3 x 15 reps ?Leg press both legs 5 plates 2 x 10, Right leg only 3 x 10 ?5# marching x 10 each leg ? ? ? ?10/30/21 ? Strength eval                       08/21/21 10/30/21  ?  Right Hip Flexion 2+/5  3+/5  ?  Right Hip Extension 2/5  2+/5  ?  Right Hip ABduction 2/5  3-/5  ?  Left Hip Flexion 4-/5  4/5  ?  Left Hip Extension 3-/5  3+/5  ?  Left Hip ABduction 3/5  3+/5  ?  Right Knee Flexion 2/5  3-/5  ?  Right Knee Extension 3/5  5/5  ?  Left Knee Flexion 3+/5  4+/5  ?  Left Knee Extension 5/5  5/5  ?  Right Ankle Dorsiflexion 3-/5  4-/5  ?  Right Ankle Plantar Flexion 2/5  2+/5  ?  Left Ankle Dorsiflexion 3-/5  4+/5  ?  Left Ankle Plantar Flexion 3/5  3+/5  ?  ?   ?  ?  ?SLS Right foot 5 sec, left foot 20 sec ? ? ?STS with red med ball in Ue's up to heel raise for balance 2 x 10 ? ?Bodycraft walkouts all direction 3PL 5RT with CGA for safety ?Leg press BLE 2x 10 5Pl; Rt LE only 3 Pl x 10 ?Hamstring curl 2 1/2 PL Rt Le only 2x 15 slow control ? ? ?PATIENT EDUCATION: ?Education details: on exercise form and function 11/21/21: on form, core activation and tall  posture ?Person educated: Patient ?Education method: Explanation and  Demonstration ?Education comprehension: verbalized understanding and returned demonstration ? ? ?HOME EXERCISE PROGRAM: ?sitting ankle dorsi/plantarflexion, LAQ, bridge.; 2/23: heel raises, hip abd/ext; 2/27 - sidelying hip abd, clams, prone hip ext;3/7: heelraise, squat, step up , sit to stand, and Sidelying Hip Abduction - 2-3 x daily - 7 x weekly - 3 sets - 6 reps  ? ? PT Short Term Goals - from 10/30/21  ? ?  ? PT SHORT TERM GOAL #1  ? Title PT to be I in HEP to increase mm strength by 1/2 grade to allow pt to feel confident walking inside with a cane.   ? Time 3   ? Period Weeks   ? Status  Met walking with cane  ? Target Date 09/11/21   ?  ? PT SHORT TERM GOAL #2  ? Title PT to be able to stand on his Rt LE for 15 seconds to reduce risk of falling   ? Time 2  ? Period Weeks   ? Status On-going, currently 5 sec  ? ?  ?  ? ?  ? ? ? PT Long Term Goals - 10/30/21 1031   ? ?  ? PT LONG TERM GOAL #1  ? Title Pt to be completing an advance HEP to increase LE strength by one grade to allow pt to go up and down 8 steps in a reciprocal manner.   ? Time 4  ? Period Weeks   ? Status On-going   ? Target Date 10/30/21  ?  ? PT LONG TERM GOAL #2  ? Title PT to be able to balance on both legs for 20 seconds to decrease risk of falling so pt feels confident walking outside with a cane.   ? Time 4  ? Period Weeks  10/30/21  ? Status Partially met; Right leg 5 sec, Left 20 sec  ? ?  ?  ? ?  ? ?  Plan   ?  ?  Clinical Impression Statement Today's session continued to work on hip mobility and strengthening and dynamic standing balance.  Continues to work hard and accepting of increased resistance and challenges.  Demonstrating difficulty with upright posture during most exercises as he often falls into forward flexion to compensate due to hip extension weakness and needs consistent cueing for tall standing with core activation.  During added red theraband for hip  excursions, shows decreased amplitude on R verse L LE demonstrating weakness. Overall, patient reported enjoyment of hard work as also observed with perspiration today. Patient will benefit from continued skilled the

## 2021-11-28 ENCOUNTER — Telehealth (HOSPITAL_COMMUNITY): Payer: Self-pay | Admitting: Surgery

## 2021-11-28 NOTE — Telephone Encounter (Signed)
I called patient to advise that he can proceed with ordered home sleep study as insurance prior Andrew Arnold has been acquired.  He tells me that he will complete within the next few days.

## 2021-11-29 ENCOUNTER — Encounter (INDEPENDENT_AMBULATORY_CARE_PROVIDER_SITE_OTHER): Payer: BC Managed Care – PPO | Admitting: Cardiology

## 2021-11-29 DIAGNOSIS — G4733 Obstructive sleep apnea (adult) (pediatric): Secondary | ICD-10-CM

## 2021-12-01 ENCOUNTER — Ambulatory Visit: Payer: BC Managed Care – PPO

## 2021-12-01 DIAGNOSIS — I10 Essential (primary) hypertension: Secondary | ICD-10-CM

## 2021-12-01 DIAGNOSIS — I2510 Atherosclerotic heart disease of native coronary artery without angina pectoris: Secondary | ICD-10-CM

## 2021-12-01 DIAGNOSIS — I5032 Chronic diastolic (congestive) heart failure: Secondary | ICD-10-CM

## 2021-12-01 DIAGNOSIS — G4719 Other hypersomnia: Secondary | ICD-10-CM

## 2021-12-01 NOTE — Procedures (Signed)
   SLEEP STUDY REPORT Patient Information Study Date: 11/29/21 Patient Name: Andrew Arnold Patient ID: [id number] Birth Date: [birth year] Age: 66 Gender: Male Referring Physician:Daniel Bensimhon, MD  TEST DESCRIPTION: Home sleep apnea testing was completed using the WatchPat, a Type 1 device, utilizing peripheral arterial tonometry (PAT), chest movement, actigraphy, pulse oximetry, pulse rate, body position and snore. AHI was calculated with apnea and hypopnea using valid sleep time as the denominator. RDI includes apneas, hypopneas, and RERAs. The data acquired and the scoring of sleep and all associated events were performed in accordance with the recommended standards and specifications as outlined in the AASM Manual for the Scoring of Sleep and Associated Events 2.2.0 (2015).  FINDINGS: 1. Mild Obstructive Sleep Apnea with AHI 10.8/hr. 2. No significant Central Sleep Apnea with pAHIc 1.2/hr. 3. Oxygen desaturations as low as 85%. 4. Moderate snoring was present. O2 sats were < 88% for 0 min. 5. Total sleep time was 8 hrs and 38 min. 6. 22.1% of total sleep time was spent in REM sleep. 7. Shortened sleep onset latency at 6 min. 8. Normal REM sleep onset latency at 87 min. 9. Total awakenings were 12.  DIAGNOSIS: Mild Obstructive Sleep Apnea (G47.33)  RECOMMENDATIONS: 1. Clinical correlation of these findings is necessary. The decision to treat obstructive sleep apnea (OSA) is usually based on the presence of apnea symptoms or the presence of associated medical conditions such as Hypertension, Congestive Heart Failure, Atrial Fibrillation or Obesity. The most common symptoms of OSA are snoring, gasping for breath while sleeping, daytime sleepiness and fatigue.  2. Initiating apnea therapy is recommended given the presence of symptoms and/or associated conditions.  Recommend proceeding with one of the following:  a. Auto-CPAP therapy with a pressure range of 5-20cm H2O.    b. An oral appliance (OA) that can be obtained from certain dentists with expertise in sleep medicine. These are primarily of use in non-obese patients with mild and moderate disease.   c. An ENT consultation which may be useful to look for specific causes of obstruction and possible treatment options.   d. If patient is intolerant to PAP therapy, consider referral to ENT for evaluation for hypoglossal nerve stimulator.  3. Close follow-up is necessary to ensure success with CPAP or oral appliance therapy for maximum benefit .  4. A follow-up oximetry study on CPAP is recommended to assess the adequacy of therapy and determine the need for supplemental oxygen or the potential need for Bi-level therapy. An arterial blood gas to determine the adequacy of baseline ventilation and oxygenation should also be considered.  5. Healthy sleep recommendations include: adequate nightly sleep (normal 7-9 hrs/night), avoidance of caffeine after noon and alcohol near bedtime, and maintaining a sleep environment that is cool, dark and quiet.  6. Weight loss for overweight patients is recommended. Even modest amounts of weight loss can significantly improve the severity of sleep apnea.  7. Snoring recommendations include: weight loss where appropriate, side sleeping, and avoidance of alcohol before bed.  8. Operation of motor vehicle should be avoided when sleepy.  Signature: Electronically Signed: 12/01/21 Fransico Him, MD; [facility]; [place], American Board of Sleep Medicine

## 2021-12-03 ENCOUNTER — Ambulatory Visit (HOSPITAL_COMMUNITY): Payer: BC Managed Care – PPO

## 2021-12-03 ENCOUNTER — Encounter (HOSPITAL_COMMUNITY): Payer: Self-pay

## 2021-12-03 DIAGNOSIS — R262 Difficulty in walking, not elsewhere classified: Secondary | ICD-10-CM | POA: Diagnosis not present

## 2021-12-03 DIAGNOSIS — M6281 Muscle weakness (generalized): Secondary | ICD-10-CM

## 2021-12-03 NOTE — Therapy (Addendum)
OUTPATIENT PHYSICAL THERAPY TREATMENT NOTE   Patient Name: Andrew Arnold MRN: [id number] DOB:August 09, 1955, 66 y.o., male Today's Date: 12/03/2021  PCP: [name], MD REFERRING PROVIDER: Thurnell Lose, MD   PT End of Session - 12/03/21 1734     Visit Number 24    Number of Visits [place] - Visit Number 15    Authorization - Number of Visits 30    Progress Note Due on Visit 30    PT Start Time 1658    Activity Tolerance Patient tolerated treatment well    Behavior During Therapy [facility] for tasks assessed/performed                   Past Medical History:  Diagnosis Date   Allergic rhinitis due to pollen    on allergy shots   Arrhythmia    1980s hospitalized for HTN and ?irregular rhythm but no specific dx but resolved during hospitalization   Diabetes mellitus without complication ([place])    HTN (hypertension)    Hyperlipidemia    Past Surgical History:  Procedure Laterality Date   ANTERIOR CERVICAL DECOMP/DISCECTOMY FUSION N/A 06/03/2021   Procedure: Anterior Cervical Decompression Fusion  Cervical three-four;  Surgeon: Vallarie Mare, MD;  Location: [place];  Service: Neurosurgery;  Laterality: N/A;   colonoscopy  2005   Dr. Gala Romney: normal rectum, sigmoid diverticula in colonic mucosa   COLONOSCOPY N/A 02/15/2014   Procedure: COLONOSCOPY;  Surgeon: Daneil Dolin, MD;  [facility];  Service: Endoscopy;  Laterality: N/A;  2:00   RIGHT/LEFT HEART CATH AND CORONARY ANGIOGRAPHY N/A 05/23/2021   Procedure: RIGHT/LEFT HEART CATH AND CORONARY ANGIOGRAPHY;  Surgeon: Jolaine Artist, MD;  Location: [place] [facility];  Service: Cardiovascular;  Laterality: N/A;   Patient Active Problem List   Diagnosis Date Noted   CAP (community acquired pneumonia) 08/01/2021   Nausea and vomiting 08/01/2021   DM2 (diabetes mellitus, type 2) ([place]) 08/01/2021   Coronary artery disease involving native coronary artery of  native heart without angina pectoris 07/31/2021   Stenosis of cervical spine with myelopathy ([place]) 06/03/2021   Macrocytic anemia 05/15/2021   Bilateral lower extremity edema 05/15/2021   BPH (benign prostatic hyperplasia) 05/15/2021   Elevated PSA 05/15/2021   Cervical spinal stenosis 05/15/2021   DDD (degenerative disc disease), cervical 05/15/2021   Syncope 05/14/2021   Elevated hemoglobin A1c 04/10/2021   Chest pain 08/29/2017   Fever 08/29/2017   Acute URI 08/29/2017   Moderate dehydration 08/29/2017   Sepsis ([place]) 08/29/2017   Elevated lactic acid level 08/29/2017   Acute gastroenteritis 08/29/2017   Diarrhea 08/29/2017   GERD (gastroesophageal reflux disease) 08/29/2017   Positive D dimer 08/29/2017   Encounter for screening colonoscopy 02/01/2014   Orthostatic hypotension 01/15/2014   Dyslipidemia 08/14/2008   Essential hypertension 08/14/2008   Seasonal and perennial allergic rhinitis 04/23/2007    REFERRING DIAG: K52.9 (ICD-10-CM) - Acute gastroenteritis   THERAPY DIAG:  No diagnosis found.  PERTINENT HISTORY: ACDF, DM, HTN, lung nodular   PRECAUTIONS: None   SUBJECTIVE: Pt reports he has been busy with school testing, gradution and records, reports increased fatigue and hasn't been able to complete to HEP as much as able.  Arrived with sleeve on Rt UE and seems to help.  PAIN:  Are you having pain? No     TODAY'S TREATMENT:  12/03/21: 2MWT 349f 5STS 15.12" Able to ascend and decline reciprocal pattern  7in 5RT with 1 HR MMT Standing: marching alternating 10x 5" Vector stance 5x 5" Leg press both legs 5 plates 3 x 15, Right leg only 3 plates 3 x 15 Bodycraft walkout 4Pl each direction 5RT each  11/19/21 Warm up aerobic and endurance on Nustep seat 10 level 3 for 8 mins There-ex standing in // bars =  cue for light to no hands on bars as able  - Heel raises 2 sets x 10 reps   - Toes raises 2 sets x 10 reps  - Hip vectors of hip flexion, hip  abduction, hip extension with 3 sec hold each 5 reps each x 3 rounds - first round no resistance, second and thrid round B verse red theraband PRE resistance today   - Step ups onto 4 inch box x 20 B leading   - trial of side step ups onto 4 inch box x 20 B   -At bodycraft machine =  Leg press both legs 5 plates 3 sets x 15 reps, Right leg only 3 plates 3 x 15   0/3/50 Power ups on 4 inch box with 3 sec holds x 15 each  Hip vectors 3" hold each 2 x 5 each Mini lunges onto 4 inch setp x 15 each without HHA  Leg press both legs 5 plates 3 x 15, Right leg only 3 plates 3 x 15 Weighted walkouts 4 plates x 5 each  STS to heel raise x 10  Nustep seat 10 level 3 x 5 min    11/06/21 Nustep 5 min dynamic warmup lv3 Weighted walkouts 4 plates x 5 each  Power ups on 4 inch box with 3 sec holds x 15 each  Leg press both legs 5 plates 2 x 15, Right leg only 3 plates 2 x 15 SLS vectors 5 sec holds  Mini lunges onto 4 inch setp x 15 each with HHA    11/04/21 Nustep x 5' warm up Walkouts 3 plates x 5 each H/S curls 3 plates 3 x 15 reps Leg press both legs 5 plates 2 x 10, Right leg only 3 x 10 5# marching x 10 each leg    10/30/21  Strength eval                       08/21/21 10/30/21 12/03/21    Right Hip Flexion 2+/5  3+/5 4-/5    Right Hip Extension 2/5  2+/5 3-/5 lacking full extension    Right Hip ABduction 2/5  3-/5 3-/5 tendency to ER    Left Hip Flexion 4-/5  4/5 4/5     Left Hip Extension 3-/5  3+/5 4/5    Left Hip ABduction 3/5  3+/5 4/5    Right Knee Flexion 2/5  3-/5 3/5    Right Knee Extension 3/5  5/5 5/5    Left Knee Flexion 3+/5  4+/5 4+/5    Left Knee Extension 5/5  5/5 5/5    Right Ankle Dorsiflexion 3-/5  4-/5 4-/5    Right Ankle Plantar Flexion 2/5  2+/5     Left Ankle Dorsiflexion 3-/5  4+/5 4+/5    Left Ankle Plantar Flexion 3/5  3+/5            SLS Right foot 5 sec, left foot 20 sec   STS with red med ball in Ue's up to heel raise for balance 2 x  10  Bodycraft walkouts all direction 3PL 5RT with CGA  for safety Leg press BLE 2x 10 5Pl; Rt LE only 3 Pl x 10 Hamstring curl 2 1/2 PL Rt Le only 2x 15 slow control   PATIENT EDUCATION: Education details: on exercise form and function 11/21/21: on form, core activation and tall posture Person educated: Patient Education method: Explanation and Demonstration Education comprehension: verbalized understanding and returned demonstration   HOME EXERCISE PROGRAM: sitting ankle dorsi/plantarflexion, LAQ, bridge.; 2/23: heel raises, hip abd/ext; 2/27 - sidelying hip abd, clams, prone hip ext;3/7: heelraise, squat, step up , sit to stand, and Sidelying Hip Abduction - 2-3 x daily - 7 x weekly - 3 sets - 6 reps    PT Short Term Goals - from 10/30/21      PT SHORT TERM GOAL #1   Title PT to be I in HEP to increase mm strength by 1/2 grade to allow pt to feel confident walking inside with a cane.    Time 3    Period Weeks    Status  12/03/21: Reports compliance with HEP 2-3x a week, has been walking inside with cane, no reports of LOB.  Met walking with cane   Target Date 09/11/21      PT SHORT TERM GOAL #2   Title PT to be able to stand on his Rt LE for 15 seconds to reduce risk of falling    Time 2   Period Weeks    Status On-going, 4/20:currently 5 sec             PT Long Term Goals - 10/30/21 1031       PT LONG TERM GOAL #1   Title Pt to be completing an advance HEP to increase LE strength by one grade to allow pt to go up and down 8 steps in a reciprocal manner.    Time 4   Period Weeks    Status 12/03/21:  reports compliance with HEP, able to complete reciprocal pattern stairs with 1HR for safety   Target Date 12/03/21: Partially met     PT LONG TERM GOAL #2   Title PT to be able to balance on both legs for 20 seconds to decrease risk of falling so pt feels confident walking outside with a cane.    Time 4   Period Weeks  10/30/21   Status Partially met; Right leg 5 sec, Left  20 sec             Plan       Clinical Impression Statement Reviewed goals, MMT, 2MWT, 5STS complete.  Pt does present with some improved strength, does continues to present with increased Rt LE weakness compared to Lt.  SLS continues to be difficult with Rt LE.  Does present with improved confidence with less reliance of HHA during standing balance activities and reports he has began gait wihtout AD at home.    Reviewed current HEP to assure proper balance activities are completed at home.        Personal Factors and Comorbidities Comorbidity 2;Age;Fitness;Time since onset of injury/illness/exacerbation     Comorbidities ACDF, DM, HTN     Examination-Activity Limitations Stairs;Transfers;Lift;Locomotion Level     Examination-Participation Restrictions Occupation;Meal Prep;Yard Work;Community Activity;Cleaning     Stability/Clinical Decision Making Stable/Uncomplicated     Rehab Potential Good     PT Frequency 2x / week     PT Duration 4 weeks     PT Treatment/Interventions Patient/family education;Therapeutic activities;Stair training;Balance training;Neuromuscular re-education;Therapeutic exercise     PT Next Visit  Plan Continue to work on lower extremity and core strengthening and dynamic balance.   Increase focus with SLS activities.     Ihor Austin, LPTA/CLT; [place] Rayetta Humphrey, [place] [phone]  5:37 PM, 12/03/21

## 2021-12-04 ENCOUNTER — Telehealth: Payer: Self-pay | Admitting: *Deleted

## 2021-12-04 NOTE — Telephone Encounter (Signed)
Left message to return a call to discuss sleep study results. 

## 2021-12-04 NOTE — Telephone Encounter (Signed)
-----   Message from Sueanne Margarita, MD sent at 12/01/2021  4:32 PM EDT ----- Please let patient know that they have sleep apnea and recommend treating with CPAP.  Please order an auto CPAP from 4-15cm H2O with heated humidity and mask of choice.  Order overnight pulse ox on CPAP.  Followup with me in 6 weeks.

## 2021-12-09 ENCOUNTER — Encounter (HOSPITAL_COMMUNITY): Payer: Self-pay

## 2021-12-09 ENCOUNTER — Ambulatory Visit (HOSPITAL_COMMUNITY): Payer: BC Managed Care – PPO | Attending: Family Medicine

## 2021-12-09 DIAGNOSIS — E119 Type 2 diabetes mellitus without complications: Secondary | ICD-10-CM | POA: Diagnosis not present

## 2021-12-09 DIAGNOSIS — R911 Solitary pulmonary nodule: Secondary | ICD-10-CM | POA: Diagnosis not present

## 2021-12-09 DIAGNOSIS — I10 Essential (primary) hypertension: Secondary | ICD-10-CM | POA: Diagnosis not present

## 2021-12-09 DIAGNOSIS — M6281 Muscle weakness (generalized): Secondary | ICD-10-CM | POA: Insufficient documentation

## 2021-12-09 DIAGNOSIS — R262 Difficulty in walking, not elsewhere classified: Secondary | ICD-10-CM | POA: Insufficient documentation

## 2021-12-09 NOTE — Telephone Encounter (Signed)
Patient returned a call to me and was given his sleep study results and recommendations. He agrees to proceed with CPAP therapy. Order for CPAP machine sent to [facility] in [place].

## 2021-12-09 NOTE — Therapy (Signed)
OUTPATIENT PHYSICAL THERAPY TREATMENT NOTE   Patient Name: Andrew Arnold MRN: [id number] DOB:1955-12-19, 66 y.o., male Today's Date: 12/11/2021  PCP: [name], MD REFERRING PROVIDER: Thurnell Lose, MD   PT End of Session - 12/03/21 1734     Visit Number 25   Number of Visits 28    Re-eval date 01/03/22   Authorization Type BC[employer]    Authorization - Visit Number 24   Authorization - Number of Visits 30    Progress Note Due on Visit 30    PT Start Time 1703   PT End Time: 1752   Activity Tolerance Patient tolerated treatment well    Behavior During Therapy WFL for tasks assessed/performed               Past Medical History:  Diagnosis Date   Allergic rhinitis due to pollen    on allergy shots   Arrhythmia    1980s hospitalized for HTN and ?irregular rhythm but no specific dx but resolved during hospitalization   Diabetes mellitus without complication ([place])    HTN (hypertension)    Hyperlipidemia    Past Surgical History:  Procedure Laterality Date   ANTERIOR CERVICAL DECOMP/DISCECTOMY FUSION N/A 06/03/2021   Procedure: Anterior Cervical Decompression Fusion  Cervical three-four;  Surgeon: Vallarie Mare, MD;  Location: [place];  Service: Neurosurgery;  Laterality: N/A;   colonoscopy  2005   Dr. Gala Romney: normal rectum, sigmoid diverticula in colonic mucosa   COLONOSCOPY N/A 02/15/2014   Procedure: COLONOSCOPY;  Surgeon: Daneil Dolin, MD;  [facility];  Service: Endoscopy;  Laterality: N/A;  2:00   RIGHT/LEFT HEART CATH AND CORONARY ANGIOGRAPHY N/A 05/23/2021   Procedure: RIGHT/LEFT HEART CATH AND CORONARY ANGIOGRAPHY;  Surgeon: Jolaine Artist, MD;  Location: [place] [facility];  Service: Cardiovascular;  Laterality: N/A;   Patient Active Problem List   Diagnosis Date Noted   CAP (community acquired pneumonia) 08/01/2021   Nausea and vomiting 08/01/2021   DM2 (diabetes mellitus, type 2) ([place]) 08/01/2021   Coronary artery disease  involving native coronary artery of native heart without angina pectoris 07/31/2021   Stenosis of cervical spine with myelopathy ([place]) 06/03/2021   Macrocytic anemia 05/15/2021   Bilateral lower extremity edema 05/15/2021   BPH (benign prostatic hyperplasia) 05/15/2021   Elevated PSA 05/15/2021   Cervical spinal stenosis 05/15/2021   DDD (degenerative disc disease), cervical 05/15/2021   Syncope 05/14/2021   Elevated hemoglobin A1c 04/10/2021   Chest pain 08/29/2017   Fever 08/29/2017   Acute URI 08/29/2017   Moderate dehydration 08/29/2017   Sepsis ([place]) 08/29/2017   Elevated lactic acid level 08/29/2017   Acute gastroenteritis 08/29/2017   Diarrhea 08/29/2017   GERD (gastroesophageal reflux disease) 08/29/2017   Positive D dimer 08/29/2017   Encounter for screening colonoscopy 02/01/2014   Orthostatic hypotension 01/15/2014   Dyslipidemia 08/14/2008   Essential hypertension 08/14/2008   Seasonal and perennial allergic rhinitis 04/23/2007    REFERRING DIAG: K52.9 (ICD-10-CM) - Acute gastroenteritis   THERAPY DIAG:  Muscle weakness (generalized)  Difficulty in walking, not elsewhere classified  PERTINENT HISTORY: ACDF, DM, HTN, lung nodular   PRECAUTIONS: None   SUBJECTIVE: Pt stated it's been a long day, had a conference from 8- almost 4:00 this afternoon.  Stated he is interested in joining [employer].    PAIN:  Are you having pain? No     TODAY'S TREATMENT:  12/09/2021 2MWT no AD 388f Standing:  Bodycraft walkout 4Pl each direction 5RT  each March alternating no HHA 10x 5" holds 12in hurdles in // bars with intermittent HHA forward then side step 2RT therapist held hurdles when stepping over with Rt LE due to decreased hip mobility Power tower to simulate leg press at [employer] 2sets 10 reps at 32 degrees Vector stance 5x 5" with 1 HHA  Hamstring machine BLE 2x 10 4Pl, Rt LE only 3Pl    12/03/21: 2MWT 361f 5STS 15.12" Able to ascend and decline reciprocal pattern  7in 5RT with 1 HR MMT Standing: marching alternating 10x 5" Vector stance 5x 5" Leg press both legs 5 plates 3 x 15, Right leg only 3 plates 3 x 15 Bodycraft walkout 4Pl each direction 5RT each  11/19/21 Warm up aerobic and endurance on Nustep seat 10 level 3 for 8 mins There-ex standing in // bars =  cue for light to no hands on bars as able  - Heel raises 2 sets x 10 reps   - Toes raises 2 sets x 10 reps  - Hip vectors of hip flexion, hip abduction, hip extension with 3 sec hold each 5 reps each x 3 rounds - first round no resistance, second and thrid round B verse red theraband PRE resistance today   - Step ups onto 4 inch box x 20 B leading   - trial of side step ups onto 4 inch box x 20 B   -At bodycraft machine =  Leg press both legs 5 plates 3 sets x 15 reps, Right leg only 3 plates 3 x 15   51/3/08Power ups on 4 inch box with 3 sec holds x 15 each  Hip vectors 3" hold each 2 x 5 each Mini lunges onto 4 inch setp x 15 each without HHA  Leg press both legs 5 plates 3 x 15, Right leg only 3 plates 3 x 15 Weighted walkouts 4 plates x 5 each  STS to heel raise x 10  Nustep seat 10 level 3 x 5 min    11/06/21 Nustep 5 min dynamic warmup lv3 Weighted walkouts 4 plates x 5 each  Power ups on 4 inch box with 3 sec holds x 15 each  Leg press both legs 5 plates 2 x 15, Right leg only 3 plates 2 x 15 SLS vectors 5 sec holds  Mini lunges onto 4 inch setp x 15 each with HHA    11/04/21 Nustep x 5' warm up Walkouts 3 plates x 5 each H/S curls 3 plates 3 x 15 reps Leg press both legs 5 plates 2 x 10, Right leg only 3 x 10 5# marching x 10 each leg    10/30/21  Strength eval                       08/21/21 10/30/21 12/03/21    Right Hip Flexion 2+/5  3+/5 4-/5    Right Hip Extension 2/5  2+/5 3-/5 lacking full extension    Right Hip ABduction 2/5  3-/5 3-/5 tendency to ER    Left Hip Flexion 4-/5  4/5 4/5     Left Hip Extension 3-/5  3+/5 4/5    Left Hip ABduction 3/5  3+/5 4/5     Right Knee Flexion 2/5  3-/5 3/5    Right Knee Extension 3/5  5/5 5/5    Left Knee Flexion 3+/5  4+/5 4+/5    Left Knee Extension 5/5  5/5 5/5    Right Ankle Dorsiflexion  3-/5  4-/5 4-/5    Right Ankle Plantar Flexion 2/5  2+/5     Left Ankle Dorsiflexion 3-/5  4+/5 4+/5    Left Ankle Plantar Flexion 3/5  3+/5            SLS Right foot 5 sec, left foot 20 sec   STS with red med ball in Ue's up to heel raise for balance 2 x 10  Bodycraft walkouts all direction 3PL 5RT with CGA for safety Leg press BLE 2x 10 5Pl; Rt LE only 3 Pl x 10 Hamstring curl 2 1/2 PL Rt Le only 2x 15 slow control   PATIENT EDUCATION: Education details: on exercise form and function 11/21/21: on form, core activation and tall posture Person educated: Patient Education method: Explanation and Demonstration Education comprehension: verbalized understanding and returned demonstration   HOME EXERCISE PROGRAM: sitting ankle dorsi/plantarflexion, LAQ, bridge.; 2/23: heel raises, hip abd/ext; 2/27 - sidelying hip abd, clams, prone hip ext;3/7: heelraise, squat, step up , sit to stand, and Sidelying Hip Abduction - 2-3 x daily - 7 x weekly - 3 sets - 6 reps    PT Short Term Goals - from 10/30/21      PT SHORT TERM GOAL #1   Title PT to be I in HEP to increase mm strength by 1/2 grade to allow pt to feel confident walking inside with a cane.    Time 3    Period Weeks    Status  12/03/21: Reports compliance with HEP 2-3x a week, has been walking inside with cane, no reports of LOB.  Met walking with cane   Target Date 09/11/21      PT SHORT TERM GOAL #2   Title PT to be able to stand on his Rt LE for 15 seconds to reduce risk of falling    Time 2   Period Weeks    Status On-going, 4/20:currently 5 sec             PT Long Term Goals - 10/30/21 1031       PT LONG TERM GOAL #1   Title Pt to be completing an advance HEP to increase LE strength by one grade to allow pt to go up and down 8 steps in a  reciprocal manner.    Time 4   Period Weeks    Status 12/03/21:  reports compliance with HEP, able to complete reciprocal pattern stairs with 1HR for safety   Target Date 12/03/21: Partially met     PT LONG TERM GOAL #2   Title PT to be able to balance on both legs for 20 seconds to decrease risk of falling so pt feels confident walking outside with a cane.    Time 4   Period Weeks  10/30/21   Status Partially met; Right leg 5 sec, Left 20 sec             Plan       Clinical Impression Statement Increased focus with SLS based actvities as well as educated proper machines to begin at [facility].  Encouraged pt to speak to instructor at [facility] for proper mechanics with all machines at gym.  Pt continues to demonstrate difficulty with SLS based activities, required intermittent HHA.  Added hurdles with some difficulty due to hip flexion mobility and SLS.  No reports of pain through session, was limited by fatigue.  Whole session complete without AD.     Personal Factors and Comorbidities Comorbidity 2;Age;Fitness;Time since onset  of injury/illness/exacerbation     Comorbidities ACDF, DM, HTN     Examination-Activity Limitations Stairs;Transfers;Lift;Locomotion Level     Examination-Participation Restrictions Occupation;Meal Prep;Yard Work;Community Activity;Cleaning     Stability/Clinical Decision Making Stable/Uncomplicated     Rehab Potential Good     PT Frequency 2x / week     PT Duration 4 weeks     PT Treatment/Interventions Patient/family education;Therapeutic activities;Stair training;Balance training;Neuromuscular re-education;Therapeutic exercise     PT Next Visit Plan Continue to work on lower extremity and core strengthening and dynamic balance.   Increase focus with SLS activities.     Ihor Austin, LPTA/CLT; [facility] [phone]  8:28 AM, 12/11/21

## 2021-12-09 NOTE — Addendum Note (Signed)
Addended by: Leeroy Cha on: 12/09/2021 02:12 PM   Modules accepted: Orders

## 2021-12-11 ENCOUNTER — Encounter (HOSPITAL_COMMUNITY): Payer: Self-pay

## 2021-12-11 ENCOUNTER — Ambulatory Visit (HOSPITAL_COMMUNITY): Payer: BC Managed Care – PPO | Attending: Internal Medicine

## 2021-12-11 ENCOUNTER — Ambulatory Visit
Admission: RE | Admit: 2021-12-11 | Discharge: 2021-12-11 | Disposition: A | Discharge status: Home / Self Care | Payer: BC Managed Care – PPO | Source: Ambulatory Visit | Attending: Pulmonary Disease | Admitting: Pulmonary Disease

## 2021-12-11 DIAGNOSIS — R262 Difficulty in walking, not elsewhere classified: Secondary | ICD-10-CM | POA: Insufficient documentation

## 2021-12-11 DIAGNOSIS — M6281 Muscle weakness (generalized): Secondary | ICD-10-CM | POA: Diagnosis not present

## 2021-12-11 DIAGNOSIS — I3139 Other pericardial effusion (noninflammatory): Secondary | ICD-10-CM | POA: Diagnosis not present

## 2021-12-11 DIAGNOSIS — R911 Solitary pulmonary nodule: Secondary | ICD-10-CM

## 2021-12-11 IMAGING — CT CT CHEST W/O CM
1 of 2 series · 14 of 32 positions shown, 18 images · non-contrast
Comparison: Chest CT [DATE].  CT angiogram chest [DATE].

CLINICAL DATA: Lung nodule follow-up.



[Series 6: super d · axial · 0.81mm/px · z∈[+1101,+1348]mm · 14 of 346 slices shown, 18 images]
[im 19/346  mediastinal]
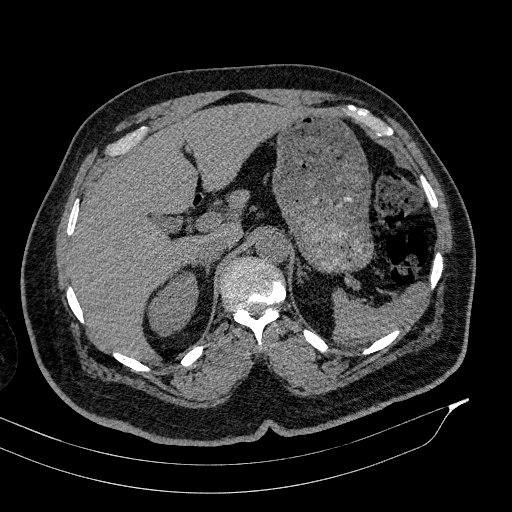
[im 19/346  lung]
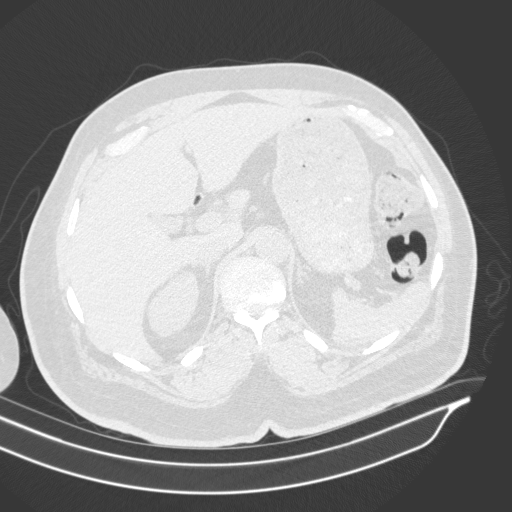
[im 55/346  lung]
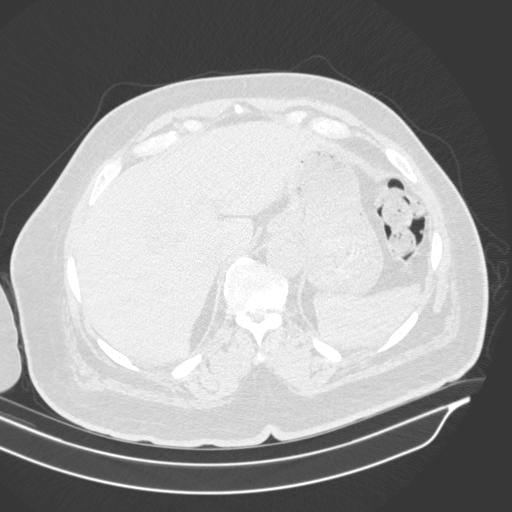
[im 73/346  lung]
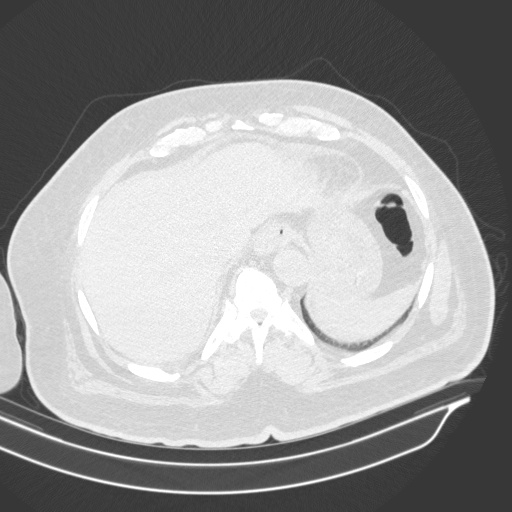
[im 109/346  lung]
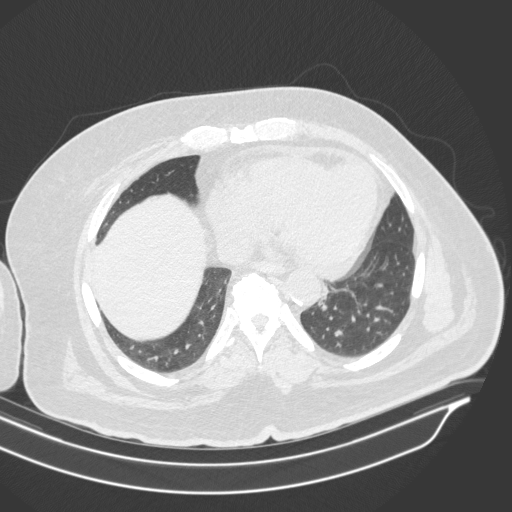
[im 116/346  mediastinal]
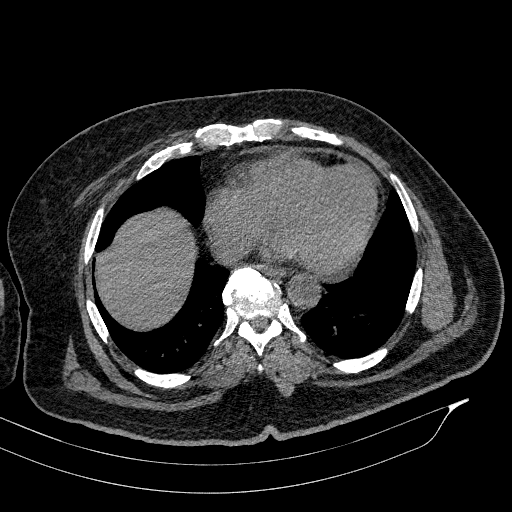
[im 116/346  lung]
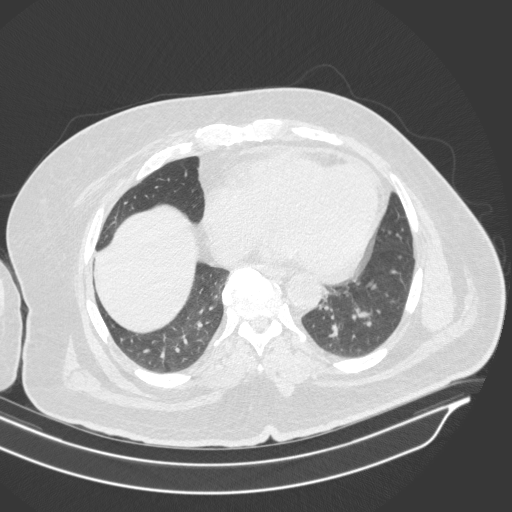
[im 146/346  lung]
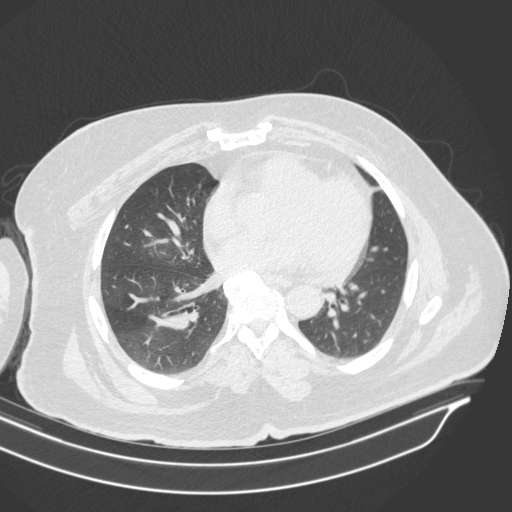
[im 161/346  lung]
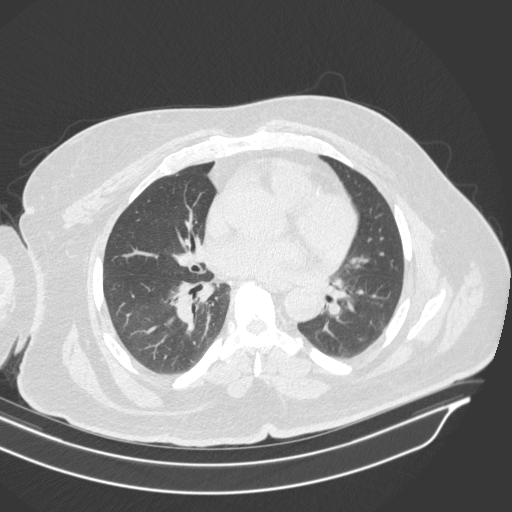
[im 182/346  lung]
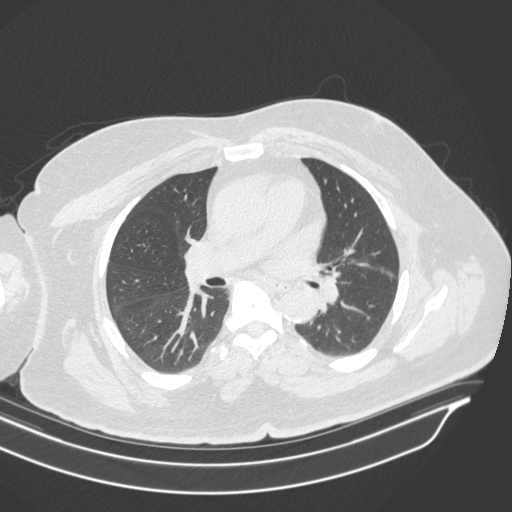
[im 200/346  mediastinal]
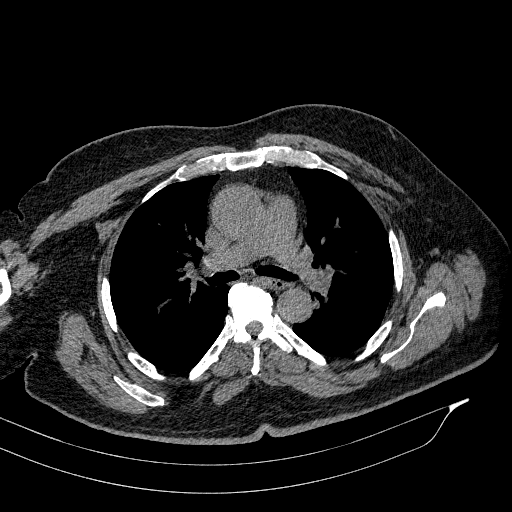
[im 200/346  lung]
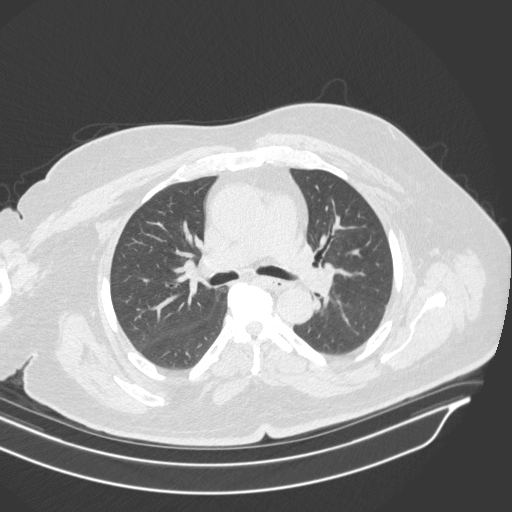
[im 231/346  lung]
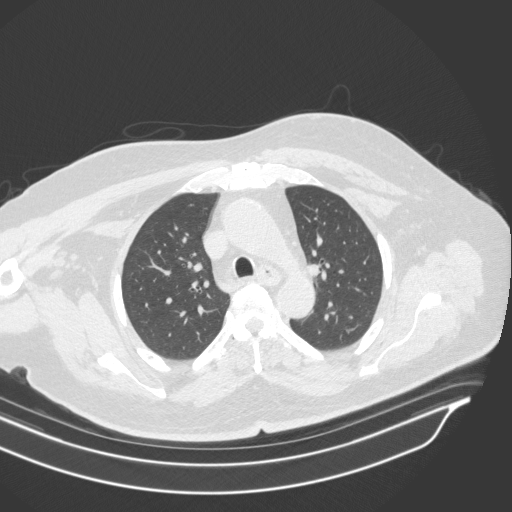
[im 237/346  lung]
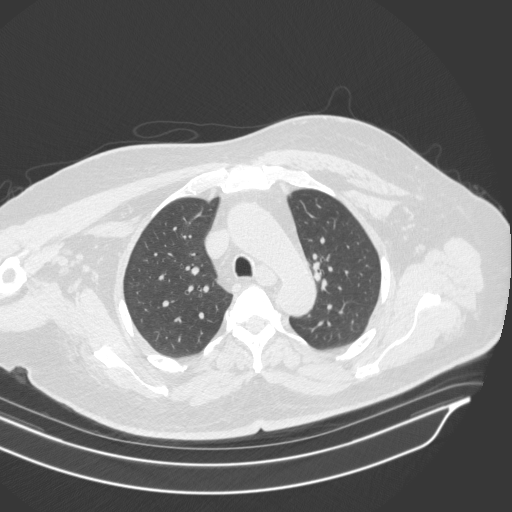
[im 273/346  lung]
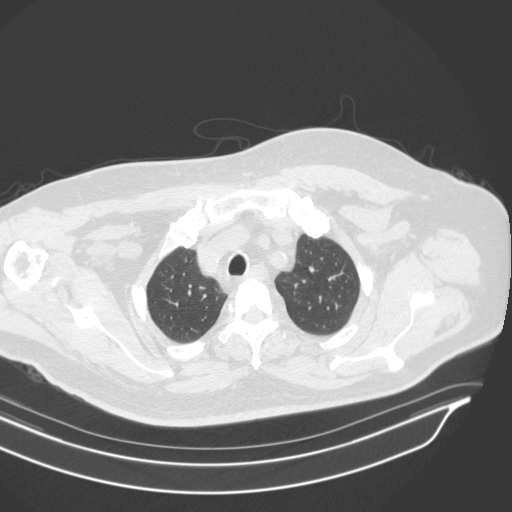
[im 291/346  mediastinal]
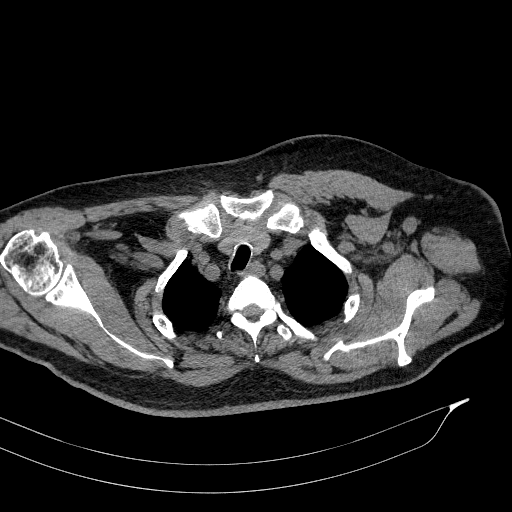
[im 291/346  lung]
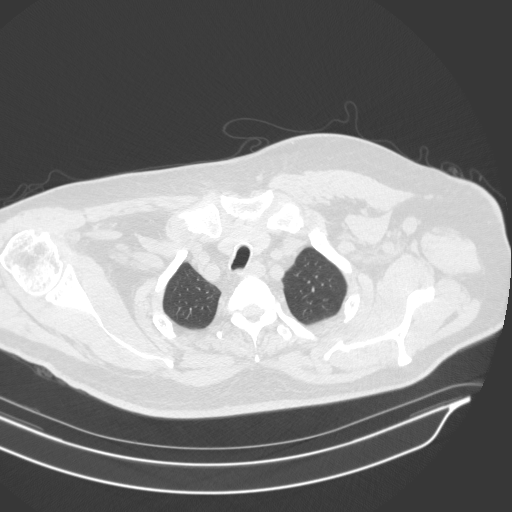
[im 327/346  lung]
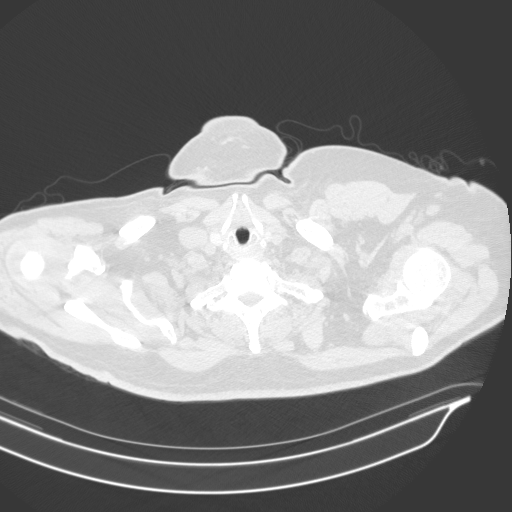

[14 of 32 positions shown; findings below may reference images not displayed]

FINDINGS: Cardiovascular: There is stable mild aneurysmal dilatation of the
ascending aorta measuring 4 cm. Heart is borderline enlarged. There
is a small pericardial effusion. There are atherosclerotic
calcifications of the aorta.

Mediastinum/Nodes: No enlarged mediastinal or axillary lymph nodes.
Thyroid gland, trachea, and esophagus demonstrate no significant
findings.

Lungs/Pleura: There is a 6 mm nodule in the left lung base/left
lower lobe. This has decreased in size compared to the prior
examination (previously measuring 12 mm). No other pulmonary nodules
are identified. There is some minimal patchy ground-glass opacities
right middle lobe and right lower lobe. There is no pleural effusion
or pneumothorax.

Upper Abdomen: No acute abnormality.

Musculoskeletal: No chest wall mass or suspicious bone lesions
identified. There is DISH of the thoracic spine.
IMPRESSION: 1. Left lower lobe pulmonary nodule measures 6 mm (previously 12
mm). Findings are likely related to resolving
infection/inflammation. Non-contrast chest CT at 6-12 months is
recommended. If the nodule is stable at time of repeat CT, then
future CT at 18-24 months (from today's scan) is considered optional
for low-risk patients, but is recommended for high-risk patients.
This recommendation follows the consensus statement: Guidelines for
Management of Incidental Pulmonary Nodules Detected on CT Images:

2. Patchy ground-glass opacities in the right lower lobe and right
middle lobe, likely infectious/inflammatory.

3. Stable aneurysmal dilatation of the ascending aorta measuring 4
cm. Recommend semi-annual imaging followup by CTA or MRA and
referral to cardiothoracic surgery if not already obtained. This
recommendation follows [[place]]
ACCF/AHA/AATS/ACR/ASA/SCA/BURGEL/BURGEL/BURGEL/BURGEL Guidelines for the
Diagnosis and Management of Patients With Thoracic Aortic Disease.
Circulation. [[place]]; 121: [id number]. Aortic aneurysm NOS ([NZ]-[NZ])

## 2021-12-11 NOTE — Therapy (Signed)
OUTPATIENT PHYSICAL THERAPY TREATMENT NOTE   Patient Name: Andrew Arnold MRN: [id number] DOB:January 18, 1956, 66 y.o., male Today's Date: 12/11/2021  PCP: [name], MD REFERRING PROVIDER: Thurnell Lose, MD  END OF SESSION:   PT End of Session - 12/11/21 1614     Visit Number 26    Number of Visits 28    Date for PT Re-Evaluation 01/03/22    Authorization Type BCBS    Authorization - Visit Number 25    Authorization - Number of Visits 30    Progress Note Due on Visit 30    PT Start Time 1616    PT Stop Time 1704    PT Time Calculation (min) 48 min    Activity Tolerance Patient tolerated treatment well    Behavior During Therapy WFL for tasks assessed/performed              Past Medical History:  Diagnosis Date   Allergic rhinitis due to pollen    on allergy shots   Arrhythmia    1980s hospitalized for HTN and ?irregular rhythm but no specific dx but resolved during hospitalization   Diabetes mellitus without complication ([place])    HTN (hypertension)    Hyperlipidemia    Past Surgical History:  Procedure Laterality Date   ANTERIOR CERVICAL DECOMP/DISCECTOMY FUSION N/A 06/03/2021   Procedure: Anterior Cervical Decompression Fusion  Cervical three-four;  Surgeon: Vallarie Mare, MD;  Location: [facility];  Service: Neurosurgery;  Laterality: N/A;   colonoscopy  2005   Dr. Gala Romney: normal rectum, sigmoid diverticula in colonic mucosa   COLONOSCOPY N/A 02/15/2014   Procedure: COLONOSCOPY;  Surgeon: Daneil Dolin, MD;  [facility];  Service: Endoscopy;  Laterality: N/A;  2:00   RIGHT/LEFT HEART CATH AND CORONARY ANGIOGRAPHY N/A 05/23/2021   Procedure: RIGHT/LEFT HEART CATH AND CORONARY ANGIOGRAPHY;  Surgeon: Jolaine Artist, MD;  Location: [facility];  Service: Cardiovascular;  Laterality: N/A;   Patient Active Problem List   Diagnosis Date Noted   CAP (community acquired pneumonia) 08/01/2021   Nausea and vomiting 08/01/2021   DM2  (diabetes mellitus, type 2) ([place]) 08/01/2021   Coronary artery disease involving native coronary artery of native heart without angina pectoris 07/31/2021   Stenosis of cervical spine with myelopathy ([place]) 06/03/2021   Macrocytic anemia 05/15/2021   Bilateral lower extremity edema 05/15/2021   BPH (benign prostatic hyperplasia) 05/15/2021   Elevated PSA 05/15/2021   Cervical spinal stenosis 05/15/2021   DDD (degenerative disc disease), cervical 05/15/2021   Syncope 05/14/2021   Elevated hemoglobin A1c 04/10/2021   Chest pain 08/29/2017   Fever 08/29/2017   Acute URI 08/29/2017   Moderate dehydration 08/29/2017   Sepsis ([place]) 08/29/2017   Elevated lactic acid level 08/29/2017   Acute gastroenteritis 08/29/2017   Diarrhea 08/29/2017   GERD (gastroesophageal reflux disease) 08/29/2017   Positive D dimer 08/29/2017   Encounter for screening colonoscopy 02/01/2014   Orthostatic hypotension 01/15/2014   Dyslipidemia 08/14/2008   Essential hypertension 08/14/2008   Seasonal and perennial allergic rhinitis 04/23/2007    REFERRING DIAG: K52.9 (ICD-10-CM) - Acute gastroenteritis   THERAPY DIAG:  Muscle weakness (generalized)  Difficulty in walking, not elsewhere classified  PERTINENT HISTORY: ACDF, DM, HTN, lung nodular   PRECAUTIONS: None   SUBJECTIVE: Had CT scan earlier today.  Pt stated he wishes to continue therapy to reduce limp.  No reports of pain today.  PAIN:  Are you having pain? No     TODAY'S  TREATMENT:  12/11/21: 2MWT 461f no AD Rockerboard 247m lateral then DF/PF minimal HHA and soft touch Gait training toe to heel mechanics x 8 min Standing hamstring curls 2x 10 Stool scooting 2RT down blue line forward/backward Vector stance 5x 5" with 1 HHA Leg press Rt LE only 3Pl 3x 10 Hamstring machine Rt only 3PL 3x 10   12/09/2021 2MWT no AD [id number]:  Bodycraft walkout 4Pl each direction 5RT each March alternating no HHA 10x 5" holds 12in hurdles in //  bars with intermittent HHA forward then side step 2RT therapist held hurdles when stepping over with Rt LE due to decreased hip mobility Power tower to simulate leg press at [employer] reps at 32 degrees Vector stance 5x 5" with 1 HHA  Hamstring machine BLE 2x 10 4Pl, Rt LE only 3Pl    12/03/21: 2MWT [id number] 15.12" Able to ascend and decline reciprocal pattern 7in 5RT with 1 HR MMT Standing: marching alternating 10x 5" Vector stance 5x 5" Leg press both legs 5 plates 3 x 15, Right leg only 3 plates 3 x 15 Bodycraft walkout 4Pl each direction 5RT each  11/19/21 Warm up aerobic and endurance on Nustep seat 10 level 3 for 8 mins There-ex standing in // bars =  cue for light to no hands on bars as able  - Heel raises 2 sets x 10 reps   - Toes raises 2 sets x 10 reps  - Hip vectors of hip flexion, hip abduction, hip extension with 3 sec hold each 5 reps each x 3 rounds - first round no resistance, second and thrid round B verse red theraband PRE resistance today   - Step ups onto 4 inch box x 20 B leading   - trial of side step ups onto 4 inch box x 20 B   -At bodycraft machine =  Leg press both legs 5 plates 3 sets x 15 reps, Right leg only 3 plates 3 x 15   5/9/3/3/29er ups on 4 inch box with 3 sec holds x 15 each  Hip vectors 3" hold each 2 x 5 each Mini lunges onto 4 inch setp x 15 each without HHA  Leg press both legs 5 plates 3 x 15, Right leg only 3 plates 3 x 15 Weighted walkouts 4 plates x 5 each  STS to heel raise x 10  Nustep seat 10 level 3 x 5 min    11/06/21 Nustep 5 min dynamic warmup lv3 Weighted walkouts 4 plates x 5 each  Power ups on 4 inch box with 3 sec holds x 15 each  Leg press both legs 5 plates 2 x 15, Right leg only 3 plates 2 x 15 SLS vectors 5 sec holds  Mini lunges onto 4 inch setp x 15 each with HHA    11/04/21 Nustep x 5' warm up Walkouts 3 plates x 5 each H/S curls 3 plates 3 x 15 reps Leg press both legs 5 plates 2 x 10, Right leg only  3 x 10 5# marching x 10 each leg    10/30/21  Strength eval                       08/21/21 10/30/21 12/03/21    Right Hip Flexion 2+/5  3+/5 4-/5    Right Hip Extension 2/5  2+/5 3-/5 lacking full extension    Right Hip ABduction 2/5  3-/5 3-/5 tendency to ER  Left Hip Flexion 4-/5  4/5 4/5     Left Hip Extension 3-/5  3+/5 4/5    Left Hip ABduction 3/5  3+/5 4/5    Right Knee Flexion 2/5  3-/5 3/5    Right Knee Extension 3/5  5/5 5/5    Left Knee Flexion 3+/5  4+/5 4+/5    Left Knee Extension 5/5  5/5 5/5    Right Ankle Dorsiflexion 3-/5  4-/5 4-/5    Right Ankle Plantar Flexion 2/5  2+/5     Left Ankle Dorsiflexion 3-/5  4+/5 4+/5    Left Ankle Plantar Flexion 3/5  3+/5            SLS Right foot 5 sec, left foot 20 sec   STS with red med ball in Ue's up to heel raise for balance 2 x 10  Bodycraft walkouts all direction 3PL 5RT with CGA for safety Leg press BLE 2x 10 5Pl; Rt LE only 3 Pl x 10 Hamstring curl 2 1/2 PL Rt Le only 2x 15 slow control   PATIENT EDUCATION: Education details: on exercise form and function 11/21/21: on form, core activation and tall posture Person educated: Patient Education method: Explanation and Demonstration Education comprehension: verbalized understanding and returned demonstration   HOME EXERCISE PROGRAM: sitting ankle dorsi/plantarflexion, LAQ, bridge.; 2/23: heel raises, hip abd/ext; 2/27 - sidelying hip abd, clams, prone hip ext;3/7: heelraise, squat, step up , sit to stand, and Sidelying Hip Abduction - 2-3 x daily - 7 x weekly - 3 sets - 6 reps    PT Short Term Goals - from 10/30/21      PT SHORT TERM GOAL #1   Title PT to be I in HEP to increase mm strength by 1/2 grade to allow pt to feel confident walking inside with a cane.    Time 3    Period Weeks    Status  12/03/21: Reports compliance with HEP 2-3x a week, has been walking inside with cane, no reports of LOB.  Met walking with cane   Target Date 09/11/21      PT SHORT  TERM GOAL #2   Title PT to be able to stand on his Rt LE for 15 seconds to reduce risk of falling    Time 2   Period Weeks    Status On-going, 4/20:currently 5 sec             PT Long Term Goals - 10/30/21 1031       PT LONG TERM GOAL #1   Title Pt to be completing an advance HEP to increase LE strength by one grade to allow pt to go up and down 8 steps in a reciprocal manner.    Time 4   Period Weeks    Status 12/03/21:  reports compliance with HEP, able to complete reciprocal pattern stairs with 1HR for safety   Target Date 12/03/21: Partially met     PT LONG TERM GOAL #2   Title PT to be able to balance on both legs for 20 seconds to decrease risk of falling so pt feels confident walking outside with a cane.    Time 4   Period Weeks  10/30/21   Status Partially met; Right leg 5 sec, Left 20 sec             Plan       Clinical Impression Statement Increased focus with SLS based actvities as well as educated proper machines to begin  at [facility].  Encouraged pt to speak to instructor at [facility] for proper mechanics with all machines at gym.  Pt continues to demonstrate difficulty with SLS based activities, required intermittent HHA.  Added hurdles with some difficulty due to hip flexion mobility and SLS.  No reports of pain through session, was limited by fatigue.  Whole session complete without AD.  Increased focus with gait mechanics as pt stated limping is his biggest concern.  Noted hamstring weakness and decreased mobility during gait.  Added stool scooting and continues focus with posterior chain strengthening.  Improved gait mechanics at [facility].  No reports of pain, was limited by fatigue.    Personal Factors and Comorbidities Comorbidity 2;Age;Fitness;Time since onset of injury/illness/exacerbation     Comorbidities ACDF, DM, HTN     Examination-Activity Limitations Stairs;Transfers;Lift;Locomotion Level     Examination-Participation Restrictions Occupation;Meal Prep;Yard  Work;Community Activity;Cleaning     Stability/Clinical Decision Making Stable/Uncomplicated     Rehab Potential Good     PT Frequency 2x / week     PT Duration 4 weeks     PT Treatment/Interventions Patient/family education;Therapeutic activities;Stair training;Balance training;Neuromuscular re-education;Therapeutic exercise     PT Next Visit Plan Work on gait training to reduce limping, balance training, floor to standing transfer.     Ihor Austin, LPTA/CLT; Delana Meyer [phone]  6:29 PM, 12/11/21

## 2021-12-12 NOTE — Progress Notes (Signed)
SG, seeing you next week on June 9.  Nodules are stable.  Needs 30-monthfollow-up.  Thanks,  BLI  BGarner Nash DO [facility] 12/12/2021 4:37 PM

## 2021-12-15 ENCOUNTER — Ambulatory Visit (HOSPITAL_COMMUNITY): Payer: BC Managed Care – PPO

## 2021-12-15 DIAGNOSIS — R262 Difficulty in walking, not elsewhere classified: Secondary | ICD-10-CM | POA: Diagnosis not present

## 2021-12-15 DIAGNOSIS — M6281 Muscle weakness (generalized): Secondary | ICD-10-CM

## 2021-12-15 NOTE — Therapy (Signed)
OUTPATIENT PHYSICAL THERAPY TREATMENT NOTE   Patient Name: Andrew Arnold MRN: [id number] DOB:06/15/1956, 66 y.o., male Today's Date: 12/15/2021  PCP: [name], MD REFERRING PROVIDER: Thurnell Lose, MD  END OF SESSION:   PT End of Session - 12/15/21 1557     Visit Number 27    Number of Visits 28    Date for PT Re-Evaluation 01/03/22    Authorization Type BCBS    Authorization - Visit Number 37    Authorization - Number of Visits 30    Progress Note Due on Visit 30    PT Start Time 1555    PT Stop Time 1642    PT Time Calculation (min) 47 min    Activity Tolerance Patient tolerated treatment well    Behavior During Therapy WFL for tasks assessed/performed               Past Medical History:  Diagnosis Date   Allergic rhinitis due to pollen    on allergy shots   Arrhythmia    1980s hospitalized for HTN and ?irregular rhythm but no specific dx but resolved during hospitalization   Diabetes mellitus without complication ([place])    HTN (hypertension)    Hyperlipidemia    Past Surgical History:  Procedure Laterality Date   ANTERIOR CERVICAL DECOMP/DISCECTOMY FUSION N/A 06/03/2021   Procedure: Anterior Cervical Decompression Fusion  Cervical three-four;  Surgeon: Vallarie Mare, MD;  Location: [facility];  Service: Neurosurgery;  Laterality: N/A;   colonoscopy  2005   Dr. Gala Romney: normal rectum, sigmoid diverticula in colonic mucosa   COLONOSCOPY N/A 02/15/2014   Procedure: COLONOSCOPY;  Surgeon: Daneil Dolin, MD;  [facility];  Service: Endoscopy;  Laterality: N/A;  2:00   RIGHT/LEFT HEART CATH AND CORONARY ANGIOGRAPHY N/A 05/23/2021   Procedure: RIGHT/LEFT HEART CATH AND CORONARY ANGIOGRAPHY;  Surgeon: Jolaine Artist, MD;  Location: [facility];  Service: Cardiovascular;  Laterality: N/A;   Patient Active Problem List   Diagnosis Date Noted   CAP (community acquired pneumonia) 08/01/2021   Nausea and vomiting 08/01/2021   DM2  (diabetes mellitus, type 2) ([place]) 08/01/2021   Coronary artery disease involving native coronary artery of native heart without angina pectoris 07/31/2021   Stenosis of cervical spine with myelopathy ([place]) 06/03/2021   Macrocytic anemia 05/15/2021   Bilateral lower extremity edema 05/15/2021   BPH (benign prostatic hyperplasia) 05/15/2021   Elevated PSA 05/15/2021   Cervical spinal stenosis 05/15/2021   DDD (degenerative disc disease), cervical 05/15/2021   Syncope 05/14/2021   Elevated hemoglobin A1c 04/10/2021   Chest pain 08/29/2017   Fever 08/29/2017   Acute URI 08/29/2017   Moderate dehydration 08/29/2017   Sepsis ([place]) 08/29/2017   Elevated lactic acid level 08/29/2017   Acute gastroenteritis 08/29/2017   Diarrhea 08/29/2017   GERD (gastroesophageal reflux disease) 08/29/2017   Positive D dimer 08/29/2017   Encounter for screening colonoscopy 02/01/2014   Orthostatic hypotension 01/15/2014   Dyslipidemia 08/14/2008   Essential hypertension 08/14/2008   Seasonal and perennial allergic rhinitis 04/23/2007    REFERRING DIAG: K52.9 (ICD-10-CM) - Acute gastroenteritis   THERAPY DIAG:  Muscle weakness (generalized)  Difficulty in walking, not elsewhere classified  PERTINENT HISTORY: ACDF, DM, HTN, lung nodular   PRECAUTIONS: None   SUBJECTIVE: has finished up with school with the summer. No pain today.  Sometimes has some numbness in the left foot. Still some weakness on right side.   PAIN:  Are you having pain?  No     TODAY'S TREATMENT:  12/15/21 Sit to stand on foam to heel raise 3 x 5 Trial of floor transfer; needs min to mod assist to complete  Quadruped alternate arm/leg x 5 each; needs min A for balance Prone hip extension 3 x 5 each  Standing lateral stepping over yoga block 2 x 5 each, CGA for safety  Leg press right leg only 3 plates 4 x 10 Hamstring curls Cybex 3.5 plates 4 x 10 right leg only      12/11/21: 2MWT 453f no AD Rockerboard 22m  lateral then DF/PF minimal HHA and soft touch Gait training toe to heel mechanics x 8 min Standing hamstring curls 2x 10 Stool scooting 2RT down blue line forward/backward Vector stance 5x 5" with 1 HHA Leg press Rt LE only 3Pl 3x 10 Hamstring machine Rt only 3PL 3x 10   12/09/2021 2MWT no AD [id number]:  Bodycraft walkout 4Pl each direction 5RT each March alternating no HHA 10x 5" holds 12in hurdles in // bars with intermittent HHA forward then side step 2RT therapist held hurdles when stepping over with Rt LE due to decreased hip mobility Power tower to simulate leg press at [employer] reps at 32 degrees Vector stance 5x 5" with 1 HHA  Hamstring machine BLE 2x 10 4Pl, Rt LE only 3Pl    12/03/21: 2MWT [id number] 15.12" Able to ascend and decline reciprocal pattern 7in 5RT with 1 HR MMT Standing: marching alternating 10x 5" Vector stance 5x 5" Leg press both legs 5 plates 3 x 15, Right leg only 3 plates 3 x 15 Bodycraft walkout 4Pl each direction 5RT each  11/19/21 Warm up aerobic and endurance on Nustep seat 10 level 3 for 8 mins There-ex standing in // bars =  cue for light to no hands on bars as able  - Heel raises 2 sets x 10 reps   - Toes raises 2 sets x 10 reps  - Hip vectors of hip flexion, hip abduction, hip extension with 3 sec hold each 5 reps each x 3 rounds - first round no resistance, second and thrid round B verse red theraband PRE resistance today   - Step ups onto 4 inch box x 20 B leading   - trial of side step ups onto 4 inch box x 20 B   -At bodycraft machine =  Leg press both legs 5 plates 3 sets x 15 reps, Right leg only 3 plates 3 x 15   5/9/7/6/80er ups on 4 inch box with 3 sec holds x 15 each  Hip vectors 3" hold each 2 x 5 each Mini lunges onto 4 inch setp x 15 each without HHA  Leg press both legs 5 plates 3 x 15, Right leg only 3 plates 3 x 15 Weighted walkouts 4 plates x 5 each  STS to heel raise x 10  Nustep seat 10 level 3 x 5  min    11/06/21 Nustep 5 min dynamic warmup lv3 Weighted walkouts 4 plates x 5 each  Power ups on 4 inch box with 3 sec holds x 15 each  Leg press both legs 5 plates 2 x 15, Right leg only 3 plates 2 x 15 SLS vectors 5 sec holds  Mini lunges onto 4 inch setp x 15 each with HHA    11/04/21 Nustep x 5' warm up Walkouts 3 plates x 5 each H/S curls 3 plates 3 x 15 reps Leg press  both legs 5 plates 2 x 10, Right leg only 3 x 10 5# marching x 10 each leg    10/30/21  Strength eval                       08/21/21 10/30/21 12/03/21    Right Hip Flexion 2+/5  3+/5 4-/5    Right Hip Extension 2/5  2+/5 3-/5 lacking full extension    Right Hip ABduction 2/5  3-/5 3-/5 tendency to ER    Left Hip Flexion 4-/5  4/5 4/5     Left Hip Extension 3-/5  3+/5 4/5    Left Hip ABduction 3/5  3+/5 4/5    Right Knee Flexion 2/5  3-/5 3/5    Right Knee Extension 3/5  5/5 5/5    Left Knee Flexion 3+/5  4+/5 4+/5    Left Knee Extension 5/5  5/5 5/5    Right Ankle Dorsiflexion 3-/5  4-/5 4-/5    Right Ankle Plantar Flexion 2/5  2+/5     Left Ankle Dorsiflexion 3-/5  4+/5 4+/5    Left Ankle Plantar Flexion 3/5  3+/5            SLS Right foot 5 sec, left foot 20 sec   STS with red med ball in Ue's up to heel raise for balance 2 x 10  Bodycraft walkouts all direction 3PL 5RT with CGA for safety Leg press BLE 2x 10 5Pl; Rt LE only 3 Pl x 10 Hamstring curl 2 1/2 PL Rt Le only 2x 15 slow control   PATIENT EDUCATION: Education details: on exercise form and function 11/21/21: on form, core activation and tall posture Person educated: Patient Education method: Explanation and Demonstration Education comprehension: verbalized understanding and returned demonstration   HOME EXERCISE PROGRAM: sitting ankle dorsi/plantarflexion, LAQ, bridge.; 2/23: heel raises, hip abd/ext; 2/27 - sidelying hip abd, clams, prone hip ext;3/7: heelraise, squat, step up , sit to stand, and Sidelying Hip Abduction - 2-3 x daily  - 7 x weekly - 3 sets - 6 reps    PT Short Term Goals - from 10/30/21      PT SHORT TERM GOAL #1   Title PT to be I in HEP to increase mm strength by 1/2 grade to allow pt to feel confident walking inside with a cane.    Time 3    Period Weeks    Status  12/03/21: Reports compliance with HEP 2-3x a week, has been walking inside with cane, no reports of LOB.  Met walking with cane   Target Date 09/11/21      PT SHORT TERM GOAL #2   Title PT to be able to stand on his Rt LE for 15 seconds to reduce risk of falling    Time 2   Period Weeks    Status On-going, 4/20:currently 5 sec             PT Long Term Goals - 10/30/21 1031       PT LONG TERM GOAL #1   Title Pt to be completing an advance HEP to increase LE strength by one grade to allow pt to go up and down 8 steps in a reciprocal manner.    Time 4   Period Weeks    Status 12/03/21:  reports compliance with HEP, able to complete reciprocal pattern stairs with 1HR for safety   Target Date 12/03/21: Partially met     PT LONG TERM  GOAL #2   Title PT to be able to balance on both legs for 20 seconds to decrease risk of falling so pt feels confident walking outside with a cane.    Time 4   Period Weeks  10/30/21   Status Partially met; Right leg 5 sec, Left 20 sec             Plan       Clinical Impression Statement Today's session continued to focus on lower extremity strengthening, balance and functional activity.  Trial of floor transfer but patient with difficulty transitioning from quadruped to kneeling on Right knee to push up to stand; needs min to mod assist to complete.  Worked on quadruped alternate arm and leg to work on balance and strength in quadruped. Also has trouble weightbearing through the right upper extremity.  Patient continues with right lower extremity weakness and overall fall risk.Patient will benefit from continued skilled therapy interventions to address deficits and improve functional mobility.     Personal Factors and Comorbidities Comorbidity 2;Age;Fitness;Time since onset of injury/illness/exacerbation     Comorbidities ACDF, DM, HTN     Examination-Activity Limitations Stairs;Transfers;Lift;Locomotion Level     Examination-Participation Restrictions Occupation;Meal Prep;Yard Work;Community Activity;Cleaning     Stability/Clinical Decision Making Stable/Uncomplicated     Rehab Potential Good     PT Frequency 2x / week     PT Duration 4 weeks     PT Treatment/Interventions Patient/family education;Therapeutic activities;Stair training;Balance training;Neuromuscular re-education;Therapeutic exercise     PT Next Visit Plan Work on gait training to reduce limping, balance training, floor to standing transfer. reassess     4:46 PM, 12/15/21 Oneisha Ammons Small Brinton Brandel MPT [facility] [place] [phone]

## 2021-12-17 ENCOUNTER — Ambulatory Visit (HOSPITAL_COMMUNITY): Payer: BC Managed Care – PPO

## 2021-12-17 ENCOUNTER — Encounter (HOSPITAL_COMMUNITY): Payer: Self-pay

## 2021-12-17 DIAGNOSIS — R262 Difficulty in walking, not elsewhere classified: Secondary | ICD-10-CM | POA: Diagnosis not present

## 2021-12-17 DIAGNOSIS — M6281 Muscle weakness (generalized): Secondary | ICD-10-CM | POA: Diagnosis not present

## 2021-12-17 NOTE — Therapy (Addendum)
OUTPATIENT PHYSICAL THERAPY TREATMENT NOTE   Patient Name: Andrew Arnold MRN: [id number] DOB:06/09/1956, 66 y.o., male Today's Date: 12/17/2021 PHYSICAL THERAPY DISCHARGE SUMMARY  Visits from Start of Care: 28  Current functional level related to goals / functional outcomes: See below   Remaining deficits: See below   Education / Equipment: See assessment    Patient agrees to discharge. Patient goals were partially met. Patient is being discharged due to maximized rehab potential.   PCP: [name], MD REFERRING PROVIDER: Thurnell Lose, MD  END OF SESSION:   PT End of Session - 12/17/21 1625     Visit Number 28    Number of Visits 28    Date for PT Re-Evaluation 01/03/22    Authorization Type BCBS    Authorization - Visit Number 85    Authorization - Number of Visits 30    Progress Note Due on Visit 30    PT Start Time 1623    PT Stop Time 1710    PT Time Calculation (min) 47 min    Activity Tolerance Patient tolerated treatment well    Behavior During Therapy WFL for tasks assessed/performed               Past Medical History:  Diagnosis Date   Allergic rhinitis due to pollen    on allergy shots   Arrhythmia    1980s hospitalized for HTN and ?irregular rhythm but no specific dx but resolved during hospitalization   Diabetes mellitus without complication ([place])    HTN (hypertension)    Hyperlipidemia    Past Surgical History:  Procedure Laterality Date   ANTERIOR CERVICAL DECOMP/DISCECTOMY FUSION N/A 06/03/2021   Procedure: Anterior Cervical Decompression Fusion  Cervical three-four;  Surgeon: Vallarie Mare, MD;  Location: [facility];  Service: Neurosurgery;  Laterality: N/A;   colonoscopy  2005   Dr. Gala Romney: normal rectum, sigmoid diverticula in colonic mucosa   COLONOSCOPY N/A 02/15/2014   Procedure: COLONOSCOPY;  Surgeon: Daneil Dolin, MD;  [facility];  Service: Endoscopy;  Laterality: N/A;  2:00   RIGHT/LEFT HEART CATH AND  CORONARY ANGIOGRAPHY N/A 05/23/2021   Procedure: RIGHT/LEFT HEART CATH AND CORONARY ANGIOGRAPHY;  Surgeon: Jolaine Artist, MD;  Location: [facility];  Service: Cardiovascular;  Laterality: N/A;   Patient Active Problem List   Diagnosis Date Noted   CAP (community acquired pneumonia) 08/01/2021   Nausea and vomiting 08/01/2021   DM2 (diabetes mellitus, type 2) ([place]) 08/01/2021   Coronary artery disease involving native coronary artery of native heart without angina pectoris 07/31/2021   Stenosis of cervical spine with myelopathy ([place]) 06/03/2021   Macrocytic anemia 05/15/2021   Bilateral lower extremity edema 05/15/2021   BPH (benign prostatic hyperplasia) 05/15/2021   Elevated PSA 05/15/2021   Cervical spinal stenosis 05/15/2021   DDD (degenerative disc disease), cervical 05/15/2021   Syncope 05/14/2021   Elevated hemoglobin A1c 04/10/2021   Chest pain 08/29/2017   Fever 08/29/2017   Acute URI 08/29/2017   Moderate dehydration 08/29/2017   Sepsis ([place]) 08/29/2017   Elevated lactic acid level 08/29/2017   Acute gastroenteritis 08/29/2017   Diarrhea 08/29/2017   GERD (gastroesophageal reflux disease) 08/29/2017   Positive D dimer 08/29/2017   Encounter for screening colonoscopy 02/01/2014   Orthostatic hypotension 01/15/2014   Dyslipidemia 08/14/2008   Essential hypertension 08/14/2008   Seasonal and perennial allergic rhinitis 04/23/2007    REFERRING DIAG: K52.9 (ICD-10-CM) - Acute gastroenteritis   THERAPY DIAG:  Muscle  weakness (generalized)  Difficulty in walking, not elsewhere classified  PERTINENT HISTORY: ACDF, DM, HTN, lung nodular   PRECAUTIONS: None   SUBJECTIVE: Patient reports at least 70% overall improvement. He still notes some weakness on RT side that limits him to some degree. He is apprehensive to DC from therapy, but would like to transition to exercise at [facility].   PAIN:  Are you having pain? No     TODAY'S TREATMENT:   12/17/21 PT: Stairs SLS MMT  HEP review   PTA: Leg press BLE 6Pl  Rt only 3Pl Hamstring BLE 6Pl  Rt only 3Pl Free weight quad 7.5# Discussed joining [employer]/other gyms suggested personal trainer, silver sneakers and aerobic classes.  12/15/21 Sit to stand on foam to heel raise 3 x 5 Trial of floor transfer; needs min to mod assist to complete  Quadruped alternate arm/leg x 5 each; needs min A for balance Prone hip extension 3 x 5 each  Standing lateral stepping over yoga block 2 x 5 each, CGA for safety  Leg press right leg only 3 plates 4 x 10 Hamstring curls Cybex 3.5 plates 4 x 10 right leg only      12/11/21: 2MWT 447f no AD Rockerboard 29m lateral then DF/PF minimal HHA and soft touch Gait training toe to heel mechanics x 8 min Standing hamstring curls 2x 10 Stool scooting 2RT down blue line forward/backward Vector stance 5x 5" with 1 HHA Leg press Rt LE only 3Pl 3x 10 Hamstring machine Rt only 3PL 3x 10  Objective:    Strength eval                       08/21/21 10/30/21 12/03/21 12/17/21    Right Hip Flexion 2+/5  3+/5 4-/5 4    Right Hip Extension 2/5  2+/5 3-/5 lacking full extension     Right Hip ABduction 2/5  3-/5 3-/5 tendency to ER     Left Hip Flexion 4-/5  4/5 4/5  4+    Left Hip Extension 3-/5  3+/5 4/5     Left Hip ABduction 3/5  3+/5 4/5     Right Knee Flexion 2/5  3-/5 3/5     Right Knee Extension 3/5  5/5 5/5 5    Left Knee Flexion 3+/5  4+/5 4+/5     Left Knee Extension 5/5  5/5 5/5 5    Right Ankle Dorsiflexion 3-/5  4-/5 4-/5 4+    Right Ankle Plantar Flexion 2/5  2+/5      Left Ankle Dorsiflexion 3-/5  4+/5 4+/5 5    Left Ankle Plantar Flexion 3/5  3+/5              Stairs: Reciprocal on 4 and 7 inch step with int use of single hand rail   PATIENT EDUCATION: Education details: on exercise form and function 11/21/21: on form, core activation and tall posture 12/17/21 on Progress to therapy goals, HEP, transition to gym, and DC  status Person educated: Patient Education method: Explanation and Demonstration Education comprehension: verbalized understanding and returned demonstration   HOME EXERCISE PROGRAM: sitting ankle dorsi/plantarflexion, LAQ, bridge.; 2/23: heel raises, hip abd/ext; 2/27 - sidelying hip abd, clams, prone hip ext;3/7: heelraise, squat, step up , sit to stand, and Sidelying Hip Abduction - 2-3 x daily - 7 x weekly - 3 sets - 6 reps    PT Short Term Goals - from 10/30/21      PT SHORT TERM  GOAL #1   Title PT to be I in HEP to increase mm strength by 1/2 grade to allow pt to feel confident walking inside with a cane.    Time 3    Period Weeks    Status  MET   Target Date 09/11/21      PT SHORT TERM GOAL #2   Title PT to be able to stand on his Rt LE for 15 seconds to reduce risk of falling    Time 2   Period Weeks    Status Not MET (5 sec)             PT Long Term Goals - 10/30/21 1031       PT LONG TERM GOAL #1   Title Pt to be completing an advance HEP to increase LE strength by one grade to allow pt to go up and down 8 steps in a reciprocal manner.    Time 4   Period Weeks    Status MET   Target Date 12/03/21     PT LONG TERM GOAL #2   Title PT to be able to balance on both legs for 20 seconds to decrease risk of falling so pt feels confident walking outside with a cane.    Time 4   Period Weeks    Status Partially met; Right leg 5 sec, Left 20 sec             Plan       Clinical Impression Statement Patient has made good progress to therapy goals. Patient shows improved strength and functional ability. He remains limited by slight RLE weakness, and decreased static balance. Patient has reached max benefit of therapy at this time. Discussed transition to HEP and commercial gym for continued strengthening, and reviewed exercises with patient. Patient agrees with this plan. Encouraged patient to follow up with therapy services with nay further questions or concerns.   Following assessment for goals, PTA completed HEP with encouragement to join gym for possible personal trainer, join silver sneakers and water aerobic classess.    Personal Factors and Comorbidities Comorbidity 2;Age;Fitness;Time since onset of injury/illness/exacerbation     Comorbidities ACDF, DM, HTN     Examination-Activity Limitations Stairs;Transfers;Lift;Locomotion Level     Examination-Participation Restrictions Occupation;Meal Prep;Yard Work;Community Activity;Cleaning     Stability/Clinical Decision Making Stable/Uncomplicated     Rehab Potential Good     PT Frequency 2x / week     PT Duration 4 weeks     PT Treatment/Interventions Patient/family education;Therapeutic activities;Stair training;Balance training;Neuromuscular re-education;Therapeutic exercise     PT Next Visit Plan DC to HEP      5:24 PM, 12/17/21 Josue Hector PT DPT  Physical Therapist with [facility]  [phone], LPTA/CLT; CBIS [phone]

## 2021-12-19 ENCOUNTER — Encounter: Payer: Self-pay | Admitting: Acute Care

## 2021-12-19 ENCOUNTER — Ambulatory Visit (INDEPENDENT_AMBULATORY_CARE_PROVIDER_SITE_OTHER): Payer: BC Managed Care – PPO | Admitting: Acute Care

## 2021-12-19 VITALS — BP 126/74 | HR 62 | Temp 98.2°F | Ht 69.0 in | Wt 221.6 lb

## 2021-12-19 DIAGNOSIS — I7121 Aneurysm of the ascending aorta, without rupture: Secondary | ICD-10-CM | POA: Diagnosis not present

## 2021-12-19 DIAGNOSIS — R911 Solitary pulmonary nodule: Secondary | ICD-10-CM | POA: Diagnosis not present

## 2021-12-19 NOTE — Progress Notes (Signed)
History of Present Illness Andrew Arnold is a 66 y.o. male never smoker with  past medical history of hypertension, hyperlipidemia, type 2 diabetes referred for evaluation of an incidental  lung nodule by Dr. Candiss Norse. He was referred to Dr. Valeta Harms.   12/19/2021 Pt. Presents after follow up CT Chest to evaluate a 6 mm solid nodule in the LLL. We have reviewed the scan and the nodule is stable. Per recommendations we will do a 6 month follow up Ct Chest. 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 We will reassess on his follow up CT Chest, and refer to thoracic surgery after his 6 month follow up to ensure bi annual scanning. We have discussed the importance of managing BP to prevent the aneurysm from enlarging. BP is well controlled today. He has verbalized understanding. He is in agreement with a 6 month follow up scan. He denies any fever, chest pain, orthopnea, weight loss  or hemoptysis.   Test Results: Chest Imaging:12/11/2021 CT Chest  Left lower lobe pulmonary nodule measures 6 mm (previously 12 mm). Findings are likely related to resolving infection/inflammation. Non-contrast chest CT at 6-12 months is recommended. If the nodule is stable at time of repeat CT, then future CT at 18-24 months (from today's scan) is considered optional for low-risk patients, but is recommended for high-risk patients. This recommendation follows the consensus statement: Guidelines for Management of Incidental Pulmonary Nodules Detected on CT Images: From the Fleischner Society 2017; Radiology 2017; 284:228-243.   2. Patchy ground-glass opacities in the right lower lobe and right middle lobe, likely infectious/inflammatory.   3. Stable aneurysmal dilatation of the ascending aorta measuring 4 cm. Recommend semi-annual imaging followup by CTA or MRA and referral to cardiothoracic surgery if not already obtained.    07/30/2021 CTA  chest: No PE Left lower lobe pulmonary nodule, largest rounded lesion at 1.7 cm.  Other inflammatory type infiltrates present as well.  Smaller nodule in the subpleural region.     Latest Ref Rng & Units 11/08/2021    2:58 PM 08/05/2021    1:19 AM 08/04/2021    1:55 AM  CBC  WBC 4.0 - 10.5 K/uL 4.1  4.6  4.3   Hemoglobin 13.0 - 17.0 g/dL 10.4  10.6  10.4   Hematocrit 39.0 - 52.0 % 31.8  32.5  31.6   Platelets 150 - 400 K/uL 225  250  239        Latest Ref Rng & Units 11/08/2021    2:58 PM 09/24/2021   12:48 PM 08/27/2021   11:08 AM  BMP  Glucose 70 - 99 mg/dL 134  107  104   BUN 8 - 23 mg/dL '14  8  10   '$ Creatinine 0.61 - 1.24 mg/dL 0.96  0.84  0.83   Sodium 135 - 145 mmol/L 138  140  141   Potassium 3.5 - 5.1 mmol/L 3.5  3.5  4.5   Chloride 98 - 111 mmol/L 104  102  104   CO2 22 - 32 mmol/L '25  26  28   '$ Calcium 8.9 - 10.3 mg/dL 9.2  9.3  8.8     BNP    Component Value Date/Time   BNP 77.4 08/13/2021 1512    ProBNP No results found for: "PROBNP"  PFT No results found for: "FEV1PRE", "FEV1POST", "FVCPRE", "FVCPOST", "TLC", "DLCOUNC", "PREFEV1FVCRT", "PSTFEV1FVCRT"  CT CHEST WO CONTRAST  Result Date: 12/12/2021 CLINICAL  DATA:  Lung nodule follow-up. EXAM: CT CHEST WITHOUT CONTRAST TECHNIQUE: Multidetector CT imaging of the chest was performed following the standard protocol without IV contrast. RADIATION DOSE REDUCTION: This exam was performed according to the departmental dose-optimization program which includes automated exposure control, adjustment of the mA and/or kV according to patient size and/or use of iterative reconstruction technique. COMPARISON:  Chest CT 09/10/2021.  CT angiogram chest 07/30/2021. FINDINGS: Cardiovascular: There is stable mild aneurysmal dilatation of the ascending aorta measuring 4 cm. Heart is borderline enlarged. There is a small pericardial effusion. There are atherosclerotic calcifications of the aorta. Mediastinum/Nodes: No enlarged mediastinal  or axillary lymph nodes. Thyroid gland, trachea, and esophagus demonstrate no significant findings. Lungs/Pleura: There is a 6 mm nodule in the left lung base/left lower lobe. This has decreased in size compared to the prior examination (previously measuring 12 mm). No other pulmonary nodules are identified. There is some minimal patchy ground-glass opacities right middle lobe and right lower lobe. There is no pleural effusion or pneumothorax. Upper Abdomen: No acute abnormality. Musculoskeletal: No chest wall mass or suspicious bone lesions identified. 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 IMPRESSION: 1. Left lower lobe pulmonary nodule measures 6 mm (previously 12 mm). Findings are likely related to resolving infection/inflammation. Non-contrast chest CT at 6-12 months is recommended. If the nodule is stable at time of repeat CT, then future CT at 18-24 months (from today's scan) is considered optional for low-risk patients, but is recommended for high-risk patients. This recommendation follows the consensus statement: Guidelines for Management of Incidental Pulmonary Nodules Detected on CT Images: From the Fleischner Society 2017; Radiology 2017; 284:228-243. 2. Patchy ground-glass opacities in the right lower lobe and right middle lobe, likely infectious/inflammatory. 3. Stable aneurysmal dilatation of the ascending aorta measuring 4 cm. Recommend semi-annual imaging followup by CTA or MRA and referral to cardiothoracic surgery if not already obtained. This recommendation follows 2010 ACCF/AHA/AATS/ACR/ASA/SCA/SCAI/SIR/STS/SVM Guidelines for the Diagnosis and Management of Patients With Thoracic Aortic Disease. Circulation. 2010; 121: Q119-E17. Aortic aneurysm NOS (ICD10-I71.9) Electronically Signed   By: Ronney Asters M.D.   On: 12/12/2021 16:08     Past medical hx Past Medical History:  Diagnosis Date   Allergic rhinitis due to pollen    on allergy shots   Arrhythmia    1980s hospitalized for HTN and  ?irregular rhythm but no specific dx but resolved during hospitalization   Diabetes mellitus without complication ([place])    HTN (hypertension)    Hyperlipidemia      Social History   Tobacco Use   Smoking status: Never   Smokeless tobacco: Never  Vaping Use   Vaping Use: Never used  Substance Use Topics   Alcohol use: No   Drug use: No    Mr.Wadle reports that he has never smoked. He has never used smokeless tobacco. He reports that he does not drink alcohol and does not use drugs.  Tobacco Cessation: Never smoker   Past surgical hx, Family hx, Social hx all reviewed.  Current Outpatient Medications on File Prior to Visit  Medication Sig   amLODipine (NORVASC) 10 MG tablet Take 1 tablet (10 mg total) by mouth daily.   ascorbic acid (VITAMIN C) 500 MG tablet Take 500 mg by mouth every morning.    aspirin 81 MG chewable tablet Chew 1 tablet (81 mg total) by mouth daily.   atorvastatin (LIPITOR) 80 MG tablet Take 80 mg by mouth at bedtime.   B Complex Vitamins (B COMPLEX 100 PO)  Take 1 tablet by mouth every morning.    carvedilol (COREG) 6.25 MG tablet Take 1 tablet (6.25 mg total) by mouth 2 (two) times daily with a meal.   cetirizine (ZYRTEC) 10 MG tablet Take 10 mg by mouth in the morning.   Cholecalciferol (VITAMIN D3) 1.25 MG (50000 UT) CAPS Take 1 capsule by mouth once a week.   dapagliflozin propanediol (FARXIGA) 10 MG TABS tablet Take 1 tablet (10 mg total) by mouth daily before breakfast.   furosemide (LASIX) 20 MG tablet Take 20 mg by mouth daily.   metFORMIN (GLUCOPHAGE) 1000 MG tablet Take 1,000 mg by mouth 2 (two) times daily.   pantoprazole (PROTONIX) 40 MG tablet Take 40 mg by mouth daily.   sacubitril-valsartan (ENTRESTO) 24-26 MG Take 1 tablet by mouth 2 (two) times daily.   spironolactone (ALDACTONE) 25 MG tablet Take 0.5 tablets (12.5 mg total) by mouth daily.   tamsulosin (FLOMAX) 0.4 MG CAPS capsule Take 1 capsule (0.4 mg total) by mouth daily.   Vitamin  D, Ergocalciferol, (DRISDOL) 1.25 MG (50000 UNIT) CAPS capsule Take 50,000 Units by mouth every 7 (seven) days.   potassium chloride SA (KLOR-CON M20) 20 MEQ tablet Take 1 tablet (20 mEq total) by mouth daily.   No current facility-administered medications on file prior to visit.     Allergies  Allergen Reactions   Penicillins     Review Of Systems:  Constitutional:   No  weight loss, night sweats,  Fevers, chills, fatigue, or  lassitude.  HEENT:   No headaches,  Difficulty swallowing,  Tooth/dental problems, or  Sore throat,                No sneezing, itching, ear ache, nasal congestion, post nasal drip,   CV:  No chest pain,  Orthopnea, PND, swelling in lower extremities, anasarca, dizziness, palpitations, syncope.   GI  No heartburn, indigestion, abdominal pain, nausea, vomiting, diarrhea, change in bowel habits, loss of appetite, bloody stools.   Resp: No shortness of breath with exertion or at rest.  No excess mucus, no productive cough,  No non-productive cough,  No coughing up of blood.  No change in color of mucus.  No wheezing.  No chest wall deformity  Skin: no rash or lesions.  GU: no dysuria, change in color of urine, no urgency or frequency.  No flank pain, no hematuria   MS:  No joint pain or swelling.  No decreased range of motion.  No back pain.  Psych:  No change in mood or affect. No depression or anxiety.  No memory loss.   Vital Signs BP 126/74 (BP Location: Left Arm, Patient Position: Sitting, Cuff Size: Normal)   Pulse 62   Temp 98.2 F (36.8 C) (Oral)   Ht '5\' 9"'$  (1.753 m)   Wt 221 lb 9.6 oz (100.5 kg)   SpO2 98%   BMI 32.72 kg/m    Physical Exam:  General- No distress,  A&Ox3, pleasant  ENT: No sinus tenderness, TM clear, pale nasal mucosa, no oral exudate,no post nasal drip, no LAN Cardiac: S1, S2, regular rate and rhythm, no murmur Chest: No wheeze/ rales/ dullness; no accessory muscle use, no nasal flaring, no sternal retractions,  diminished per bases Abd.: Soft Non-tender, ND, BS +, Body mass index is 32.72 kg/m.  Ext: No clubbing cyanosis, edema, no obvious joint abnormalities Neuro:  normal strength, MAE x 4, A&O x 3, appropriate Skin: No rashes, warm and dry, no lesions  Psych: normal  mood and behavior   Assessment/Plan  Stable Pulmonary Nodule on follow up CT Chest in a never smoker   Left lower lobe pulmonary nodule measures 6 mm (previously 12 mm). Findings are likely related to resolving Plan Your CT Scan shows that the nodule we have been watching is stable and has not grown.  We will do a follow up CT Chest without contrast in 6 months to re-evaluate. Appointment with Dr. Valeta Harms of [place] NP after the scan to go over results ( 6 months)  Please contact office for sooner follow up if symptoms do not improve or worsen or seek emergency care  Please let us know if you need [place] sooner.   4 cm Stable Aneurysm of the ascending aorta Plan Please talk with your PCP about the 4 cm Stable aneurysmal dilatation of the ascending aorta  The most important treatment is maintaining a normal BP.  We will re-evaluate this at your follow up CT Chest in 6 months. After that scan we may refer you to [facility] for bi annual monitoring if the aneurysm.  Please contact office for sooner follow up if symptoms do not improve or worsen or seek emergency care     I spent 30  minutes dedicated to the care of this patient on the date of this encounter to include pre-visit review of records, face-to-face time with the patient discussing conditions above, post visit ordering of testing, clinical documentation with the electronic health record, making appropriate referrals as documented, and communicating necessary information to the patient's healthcare team.   Magdalen Spatz, NP 12/19/2021  5:56 PM

## 2021-12-19 NOTE — Patient Instructions (Addendum)
It is good to see you today. Your CT Scan shows that the nodule we have been watching is stable and has not grown.  We will do a follow up CT Chest without contrast in 6 months to re-evaluate. Appointment with Dr. Valeta Harms of [facility] NP after the scan to go over results ( 6 months)  Please talk with your PCP about the 4 cm Stable aneurysmal dilatation of the ascending aorta . The most important treatment is maintaining a normal BP.  Please contact office for sooner follow up if symptoms do not improve or worsen or seek emergency care  Please let us know if you need [place] sooner.

## 2021-12-23 ENCOUNTER — Encounter (HOSPITAL_COMMUNITY): Payer: BC Managed Care – PPO | Admitting: Physical Therapy

## 2021-12-25 ENCOUNTER — Encounter (HOSPITAL_COMMUNITY): Payer: BC Managed Care – PPO | Admitting: Physical Therapy

## 2022-01-02 DIAGNOSIS — M4712 Other spondylosis with myelopathy, cervical region: Secondary | ICD-10-CM | POA: Diagnosis not present

## 2022-01-02 DIAGNOSIS — Z6833 Body mass index (BMI) 33.0-33.9, adult: Secondary | ICD-10-CM | POA: Diagnosis not present

## 2022-01-07 ENCOUNTER — Ambulatory Visit (HOSPITAL_COMMUNITY)
Admission: RE | Admit: 2022-01-07 | Discharge: 2022-01-07 | Disposition: A | Discharge status: Home / Self Care | Payer: BC Managed Care – PPO | Source: Ambulatory Visit | Attending: Family Medicine | Admitting: Family Medicine

## 2022-01-07 ENCOUNTER — Other Ambulatory Visit (HOSPITAL_COMMUNITY): Payer: Self-pay | Admitting: Family Medicine

## 2022-01-07 DIAGNOSIS — M25511 Pain in right shoulder: Secondary | ICD-10-CM

## 2022-01-07 DIAGNOSIS — E559 Vitamin D deficiency, unspecified: Secondary | ICD-10-CM | POA: Diagnosis not present

## 2022-01-07 DIAGNOSIS — D649 Anemia, unspecified: Secondary | ICD-10-CM | POA: Diagnosis not present

## 2022-01-07 DIAGNOSIS — E1165 Type 2 diabetes mellitus with hyperglycemia: Secondary | ICD-10-CM | POA: Diagnosis not present

## 2022-01-08 ENCOUNTER — Encounter: Payer: Self-pay | Admitting: *Deleted

## 2022-01-15 DIAGNOSIS — G4733 Obstructive sleep apnea (adult) (pediatric): Secondary | ICD-10-CM | POA: Diagnosis not present

## 2022-01-20 ENCOUNTER — Encounter: Payer: Self-pay | Admitting: *Deleted

## 2022-01-20 DIAGNOSIS — M25511 Pain in right shoulder: Secondary | ICD-10-CM | POA: Diagnosis not present

## 2022-01-20 DIAGNOSIS — R2231 Localized swelling, mass and lump, right upper limb: Secondary | ICD-10-CM | POA: Diagnosis not present

## 2022-01-20 DIAGNOSIS — M79601 Pain in right arm: Secondary | ICD-10-CM | POA: Diagnosis not present

## 2022-01-20 NOTE — Patient Instructions (Signed)
Referring MD/PCP: [name]  Procedure: Colonoscopy  Has patient had this procedure before?  02/15/14, Dr. Gala Romney  If so, when, by whom and where?    Is there a family history of colon cancer?  no  Who?  What age when diagnosed?    Is patient diabetic? If yes, Type 1 or Type 2   no      Does patient have prosthetic heart valve or mechanical valve?  no  Do you have a pacemaker/defibrillator?  no  Has patient ever had endocarditis/atrial fibrillation? no  Does patient use oxygen? no  Has patient had joint replacement within last 12 months?  no  Is patient constipated or do they take laxatives? no  Does patient have a history of alcohol/drug use?  no  Have you had a stroke/heart attack last 6 mths? no  Do you take medicine for weight loss?  no   Is patient on blood thinner such as Coumadin, Plavix and/or Aspirin? Aspirin '81mg'$   Medications:  Current Outpatient Medications on File Prior to Visit  Medication Sig Dispense Refill   amLODipine (NORVASC) 10 MG tablet Take 1 tablet (10 mg total) by mouth daily. 30 tablet 0   ascorbic acid (VITAMIN C) 500 MG tablet Take 500 mg by mouth every morning.      aspirin 81 MG chewable tablet Chew 1 tablet (81 mg total) by mouth daily. 30 tablet 0   atorvastatin (LIPITOR) 80 MG tablet Take 80 mg by mouth at bedtime.     B Complex Vitamins (B COMPLEX 100 PO) Take 1 tablet by mouth every morning.      carvedilol (COREG) 6.25 MG tablet Take 1 tablet (6.25 mg total) by mouth 2 (two) times daily with a meal. 60 tablet 0   cetirizine (ZYRTEC) 10 MG tablet Take 10 mg by mouth in the morning.     Cholecalciferol (VITAMIN D3) 1.25 MG (50000 UT) CAPS Take 1 capsule by mouth once a week.     dapagliflozin propanediol (FARXIGA) 10 MG TABS tablet Take 1 tablet (10 mg total) by mouth daily before breakfast. 90 tablet 3   furosemide (LASIX) 20 MG tablet Take 20 mg by mouth daily.     metFORMIN (GLUCOPHAGE) 1000 MG tablet Take 1,000 mg by mouth 2 (two)  times daily.     pantoprazole (PROTONIX) 40 MG tablet Take 40 mg by mouth daily.     potassium chloride SA (KLOR-CON M20) 20 MEQ tablet Take 1 tablet (20 mEq total) by mouth daily. 90 tablet 3   sacubitril-valsartan (ENTRESTO) 24-26 MG Take 1 tablet by mouth 2 (two) times daily. 180 tablet 3   spironolactone (ALDACTONE) 25 MG tablet Take 0.5 tablets (12.5 mg total) by mouth daily. 15 tablet 6   tamsulosin (FLOMAX) 0.4 MG CAPS capsule Take 1 capsule (0.4 mg total) by mouth daily. 90 capsule 3   Vitamin D, Ergocalciferol, (DRISDOL) 1.25 MG (50000 UNIT) CAPS capsule Take 50,000 Units by mouth every 7 (seven) days.     No current facility-administered medications on file prior to visit.     Allergies:  Allergies  Allergen Reactions   Penicillins      Estimated body mass index is 32.72 kg/m as calculated from the following:   Height as of 12/19/21: '5\' 9"'$  (1.753 m).   Weight as of 12/19/21: 221 lb 9.6 oz (100.5 kg).

## 2022-01-21 ENCOUNTER — Other Ambulatory Visit (HOSPITAL_COMMUNITY): Payer: Self-pay | Admitting: Sports Medicine

## 2022-01-21 DIAGNOSIS — M79601 Pain in right arm: Secondary | ICD-10-CM

## 2022-01-22 ENCOUNTER — Ambulatory Visit (HOSPITAL_COMMUNITY)
Admission: RE | Admit: 2022-01-22 | Discharge: 2022-01-22 | Disposition: A | Discharge status: Home / Self Care | Payer: BC Managed Care – PPO | Source: Ambulatory Visit | Attending: Cardiology | Admitting: Cardiology

## 2022-01-22 DIAGNOSIS — M79601 Pain in right arm: Secondary | ICD-10-CM | POA: Insufficient documentation

## 2022-01-26 DIAGNOSIS — M2012 Hallux valgus (acquired), left foot: Secondary | ICD-10-CM | POA: Diagnosis not present

## 2022-01-26 DIAGNOSIS — B351 Tinea unguium: Secondary | ICD-10-CM | POA: Diagnosis not present

## 2022-01-26 DIAGNOSIS — M2042 Other hammer toe(s) (acquired), left foot: Secondary | ICD-10-CM | POA: Diagnosis not present

## 2022-01-27 NOTE — Progress Notes (Signed)
ASA 3, needs an ov. Offer virtual visit.

## 2022-01-27 NOTE — Progress Notes (Signed)
Please scheduled per Magda Paganini

## 2022-01-28 ENCOUNTER — Encounter: Payer: Self-pay | Admitting: Internal Medicine

## 2022-01-29 ENCOUNTER — Ambulatory Visit (INDEPENDENT_AMBULATORY_CARE_PROVIDER_SITE_OTHER): Payer: BC Managed Care – PPO | Admitting: Allergy

## 2022-01-29 ENCOUNTER — Encounter: Payer: Self-pay | Admitting: Allergy

## 2022-01-29 VITALS — BP 122/68 | HR 72 | Temp 97.5°F | Ht 69.0 in | Wt 221.0 lb

## 2022-01-29 DIAGNOSIS — H1013 Acute atopic conjunctivitis, bilateral: Secondary | ICD-10-CM | POA: Diagnosis not present

## 2022-01-29 DIAGNOSIS — I2510 Atherosclerotic heart disease of native coronary artery without angina pectoris: Secondary | ICD-10-CM

## 2022-01-29 DIAGNOSIS — J301 Allergic rhinitis due to pollen: Secondary | ICD-10-CM

## 2022-01-29 DIAGNOSIS — L500 Allergic urticaria: Secondary | ICD-10-CM | POA: Diagnosis not present

## 2022-01-29 NOTE — Patient Instructions (Addendum)
Allergic rhinitis -  doing well -  continue avoidance measures for grasses, trees, mold, dust mites, dog, cockroach -  continue antihistamine, Cetirizine '10mg'$  1 tablet 1-2 times a day  Allergic conjunctivitis - watery eyes - recommend use of over-the-counter Pataday 1 drop each eye daily as needed for watery or itchy eyes   Allergic urticaria - doing well - let us know if additional Cetirizine does not help and recommend adding Pepcid '20mg'$  twice a day to the regimen.      Follow-up 12 months or sooner if needed

## 2022-01-29 NOTE — Progress Notes (Signed)
Follow-up Note  RE: DUTCH ING MRN: [id number] DOB: 1955-10-10 Date of Office Visit: 01/29/2022   History of present illness: Andrew Arnold is a 66 y.o. male presenting today for follow-up of allergic rhinitis and urticaria.  He was last seen in the office on 01/24/21 by myself.   In Nov 2022 he had surgery on his neck.  He states sometime after the surgery he had pneumonia.  He states his appetite has changed since having pneumonia and now eats mostly salads.  He is set for a MRI tomorrow of his right shoulder/arm.  He thinks there is an issue with his rotator cuff.    He states his allegies have been fortunately doing well without significant increase in symptoms this spring or summer.  He does note some watery eyes but states it is not very bothersome.  He does continue to use Cetirizine daily.   He has not had any further hives since last visit.   Review of systems: Review of Systems  Constitutional: Negative.   HENT: Negative.    Eyes: Negative.   Respiratory: Negative.    Cardiovascular: Negative.   Musculoskeletal:        Shoulder limited mobility  Skin: Negative.   Allergic/Immunologic: Negative.   Neurological: Negative.      All other systems negative unless noted above in HPI  Past medical/social/surgical/family history have been reviewed and are unchanged unless specifically indicated below.  No changes  Medication List: Current Outpatient Medications  Medication Sig Dispense Refill   amLODipine (NORVASC) 10 MG tablet Take 1 tablet (10 mg total) by mouth daily. 30 tablet 0   ascorbic acid (VITAMIN C) 500 MG tablet Take 500 mg by mouth every morning.      aspirin 81 MG chewable tablet Chew 1 tablet (81 mg total) by mouth daily. 30 tablet 0   atorvastatin (LIPITOR) 80 MG tablet Take 80 mg by mouth at bedtime.     B Complex Vitamins (B COMPLEX 100 PO) Take 1 tablet by mouth every morning.      carvedilol (COREG) 6.25 MG tablet Take 1 tablet (6.25 mg  total) by mouth 2 (two) times daily with a meal. 60 tablet 0   cetirizine (ZYRTEC) 10 MG tablet Take 10 mg by mouth in the morning.     Cholecalciferol (VITAMIN D3) 1.25 MG (50000 UT) CAPS Take 1 capsule by mouth once a week.     dapagliflozin propanediol (FARXIGA) 10 MG TABS tablet Take 1 tablet (10 mg total) by mouth daily before breakfast. 90 tablet 3   furosemide (LASIX) 20 MG tablet Take 20 mg by mouth daily.     metFORMIN (GLUCOPHAGE) 1000 MG tablet Take 1,000 mg by mouth 2 (two) times daily.     pantoprazole (PROTONIX) 40 MG tablet Take 40 mg by mouth daily.     sacubitril-valsartan (ENTRESTO) 24-26 MG Take 1 tablet by mouth 2 (two) times daily. 180 tablet 3   spironolactone (ALDACTONE) 25 MG tablet Take 0.5 tablets (12.5 mg total) by mouth daily. 15 tablet 6   tamsulosin (FLOMAX) 0.4 MG CAPS capsule Take 1 capsule (0.4 mg total) by mouth daily. 90 capsule 3   Vitamin D, Ergocalciferol, (DRISDOL) 1.25 MG (50000 UNIT) CAPS capsule Take 50,000 Units by mouth every 7 (seven) days.     potassium chloride SA (KLOR-CON M20) 20 MEQ tablet Take 1 tablet (20 mEq total) by mouth daily. 90 tablet 3   No current facility-administered medications for this visit.  Known medication allergies: Allergies  Allergen Reactions   Penicillins      Physical examination: Blood pressure 122/68, pulse 72, temperature (!) 97.5 F (36.4 C), temperature source Temporal, height '5\' 9"'$  (1.753 m), weight 221 lb (100.2 kg), SpO2 98 %.  General: Alert, interactive, in no acute distress. HEENT: PERRLA, TMs pearly gray, turbinates non-edematous without discharge, post-pharynx non erythematous. Neck: Supple without lymphadenopathy. Lungs: Clear to auscultation without wheezing, rhonchi or rales. {no increased work of breathing. CV: Normal S1, S2 without murmurs. Abdomen: Nondistended, nontender. Skin: Warm and dry, without lesions or rashes. Extremities:  wearing sleeve on right arm, No clubbing, cyanosis or  edema. Neuro:   Grossly intact.  Diagnositics/Labs: None today  Assessment and plan: Allergic rhinitis -  doing well -  continue avoidance measures for grasses, trees, mold, dust mites, dog, cockroach -  continue antihistamine, Cetirizine '10mg'$  1 tablet 1-2 times a day  Allergic conjunctivitis - watery eyes - recommend use of over-the-counter Pataday 1 drop each eye daily as needed for watery or itchy eyes   Allergic urticaria - doing well - let us know if additional Cetirizine does not help and recommend adding Pepcid '20mg'$  twice a day to the regimen.     Follow-up 12 months or sooner if needed   I appreciate the opportunity to take part in Toron's care. Please do not hesitate to contact me with questions.  Sincerely,   Prudy Feeler, MD Allergy/Immunology [facility]

## 2022-01-30 DIAGNOSIS — M25511 Pain in right shoulder: Secondary | ICD-10-CM | POA: Diagnosis not present

## 2022-02-06 DIAGNOSIS — M25511 Pain in right shoulder: Secondary | ICD-10-CM | POA: Diagnosis not present

## 2022-02-10 DIAGNOSIS — M7501 Adhesive capsulitis of right shoulder: Secondary | ICD-10-CM | POA: Diagnosis not present

## 2022-02-10 DIAGNOSIS — G959 Disease of spinal cord, unspecified: Secondary | ICD-10-CM | POA: Diagnosis not present

## 2022-02-15 DIAGNOSIS — G4733 Obstructive sleep apnea (adult) (pediatric): Secondary | ICD-10-CM | POA: Diagnosis not present

## 2022-02-17 DIAGNOSIS — B351 Tinea unguium: Secondary | ICD-10-CM | POA: Diagnosis not present

## 2022-02-18 DIAGNOSIS — M79601 Pain in right arm: Secondary | ICD-10-CM | POA: Diagnosis not present

## 2022-02-18 DIAGNOSIS — E1165 Type 2 diabetes mellitus with hyperglycemia: Secondary | ICD-10-CM | POA: Diagnosis not present

## 2022-02-18 DIAGNOSIS — K635 Polyp of colon: Secondary | ICD-10-CM | POA: Diagnosis not present

## 2022-02-18 DIAGNOSIS — E559 Vitamin D deficiency, unspecified: Secondary | ICD-10-CM | POA: Diagnosis not present

## 2022-02-20 DIAGNOSIS — E119 Type 2 diabetes mellitus without complications: Secondary | ICD-10-CM | POA: Diagnosis not present

## 2022-02-20 DIAGNOSIS — D649 Anemia, unspecified: Secondary | ICD-10-CM | POA: Diagnosis not present

## 2022-02-20 DIAGNOSIS — E1165 Type 2 diabetes mellitus with hyperglycemia: Secondary | ICD-10-CM | POA: Diagnosis not present

## 2022-02-20 DIAGNOSIS — D519 Vitamin B12 deficiency anemia, unspecified: Secondary | ICD-10-CM | POA: Diagnosis not present

## 2022-02-24 DIAGNOSIS — M25511 Pain in right shoulder: Secondary | ICD-10-CM | POA: Diagnosis not present

## 2022-02-24 DIAGNOSIS — M25611 Stiffness of right shoulder, not elsewhere classified: Secondary | ICD-10-CM | POA: Diagnosis not present

## 2022-03-03 DIAGNOSIS — M25511 Pain in right shoulder: Secondary | ICD-10-CM | POA: Diagnosis not present

## 2022-03-03 DIAGNOSIS — M25611 Stiffness of right shoulder, not elsewhere classified: Secondary | ICD-10-CM | POA: Diagnosis not present

## 2022-03-03 DIAGNOSIS — B351 Tinea unguium: Secondary | ICD-10-CM | POA: Diagnosis not present

## 2022-03-10 ENCOUNTER — Encounter: Payer: Self-pay | Admitting: Neurology

## 2022-03-10 ENCOUNTER — Ambulatory Visit (INDEPENDENT_AMBULATORY_CARE_PROVIDER_SITE_OTHER): Payer: BC Managed Care – PPO | Admitting: Neurology

## 2022-03-10 VITALS — BP 115/75 | HR 70 | Ht 69.0 in | Wt 229.5 lb

## 2022-03-10 DIAGNOSIS — R202 Paresthesia of skin: Secondary | ICD-10-CM

## 2022-03-10 DIAGNOSIS — G8929 Other chronic pain: Secondary | ICD-10-CM | POA: Insufficient documentation

## 2022-03-10 DIAGNOSIS — M545 Low back pain, unspecified: Secondary | ICD-10-CM | POA: Diagnosis not present

## 2022-03-10 DIAGNOSIS — R269 Unspecified abnormalities of gait and mobility: Secondary | ICD-10-CM | POA: Diagnosis not present

## 2022-03-10 NOTE — Progress Notes (Addendum)
Chief Complaint  Patient presents with   New Patient (Initial Visit)    Rm 13. Alone. NP/Paper proficient/Kevin Supple MD [facility]/Ataxia and possible myelopathy - hyperreflexia.      ASSESSMENT AND PLAN  TORREY BALLINAS is a 66 y.o. 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   MEDICAL HISTORY:  ROSIE GOLSON, is a 66 year old male seen in request by PICC surgeon Justice Britain for evaluation of right-sided weakness, his primary care physician is Dr. [name], initial evaluation was on March 10, 2022  I reviewed and summarized the referring note. PMHX. HTN HLD DM-since 2022,  GERD. 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 Claudette Head on June 03, 2022,   He reported recovering well, he can move his right arm and walking better, he lives  alone, Hospital admission in January 2023 for generalized weakness, pneumonia versus gastroenteritis, weight loss, severely dehydrated, orthostatic hypotension,  Ultrasound of carotid artery showed no significant large vessel disease  ER presentation April 23 chest pain, CT chest showed patchy groundglass opacity in the right lower lobe right middle lobe likely infection  He complains of right shoulder pain, was seen by orthopedic surgeon Dr. Onnie Graham, MRI of right shoulder from a note also January 30, 2022, severe supraspinatus and infraspinatus tendinosis, 2.0 x 1.9 cm high-grade partial articular surface tear is evident, just medial to the infraspinatus insertion,   During my examination, he was noted to have right arm and leg weakness, before proceeding to further orthopedic intervention, is referred to neurology for evaluation  Patient is a poor historian, complains of gait abnormality, right hand weakness, paresthesia, denies significant neck pain, does have low back pain, more to the left side, also noticed left lateral leg paresthesia, left toe numbness   PHYSICAL EXAM:   Vitals:   03/10/22 1512  BP: 115/75  Pulse: 70  Weight: 229 lb 8 oz (104.1 kg)  Height: '5\' 9"'$  (1.753 m)   Not recorded     Body mass index is 33.89 kg/m.  PHYSICAL EXAMNIATION:  Gen: NAD, conversant, well nourised, well groomed                     Cardiovascular: Regular rate rhythm, no peripheral edema, warm, nontender. Eyes: Conjunctivae clear without exudates or hemorrhage Neck: Supple, no carotid bruits. Pulmonary: Clear to auscultation bilaterally   NEUROLOGICAL EXAM:  MENTAL STATUS: Speech/cognition: Awake, alert, oriented to history taking and casual conversation CRANIAL NERVES: CN II: Visual fields are full to confrontation.  Pupils are round equal and briskly reactive to light. CN III, IV, VI: extraocular movement are normal. No ptosis. CN V: Facial sensation is intact to light touch CN VII:  Face is symmetric with normal eye closure  CN VIII: Hearing is normal to causal conversation. CN IX, X: Phonation is normal. CN XI: Head turning and shoulder shrug are intact CN XII: Narrow oropharyngeal space  MOTOR: Moderate right shoulder abduction, external rotation, elbow flexion, extension weakness, no significant right upper extremity distal muscle weakness  Moderate right hip flexion, ankle dorsiflexion weakness, mild right knee flexion extension weakness,  REFLEXES: Reflexes are 2+ and symmetric at the biceps, triceps, knees, and absent ankles. Plantar responses are flexor.  SENSORY: Intact to light touch, pinprick and vibratory sensation are intact in fingers and toes.  COORDINATION: There is no trunk or limb dysmetria noted.  GAIT/STANCE: He needs push-up to get up from seated position, dragging right leg tendency to hyperextend right knee  REVIEW OF SYSTEMS:  Full 14 system review of systems performed and notable only for as above All other review of systems were negative.   ALLERGIES: Allergies  Allergen Reactions   Penicillins     HOME MEDICATIONS: Current Outpatient Medications  Medication Sig Dispense Refill   amLODipine (NORVASC) 10 MG tablet Take 1 tablet (10 mg total) by mouth daily. 30 tablet 0   ascorbic acid (VITAMIN C) 500 MG tablet Take 500 mg by mouth every morning.      aspirin 81 MG chewable tablet Chew 1 tablet (81 mg total) by mouth daily. 30 tablet 0   atorvastatin (LIPITOR) 80 MG tablet Take 80 mg by mouth at bedtime.     B Complex Vitamins (B COMPLEX 100 PO) Take 1 tablet by mouth every morning.      carvedilol (COREG) 6.25 MG tablet Take 1 tablet (6.25 mg total) by mouth 2 (two) times daily with a meal. 60 tablet 0   cetirizine (ZYRTEC) 10 MG tablet Take 10 mg by mouth in the morning.     dapagliflozin propanediol (FARXIGA) 10 MG TABS tablet Take 1 tablet (10 mg total) by mouth daily before breakfast. 90 tablet 3   metFORMIN (GLUCOPHAGE)  1000 MG tablet Take 1,000 mg by mouth 2 (two) times daily.     sacubitril-valsartan (ENTRESTO) 24-26 MG Take 1 tablet by mouth 2 (two) times daily. 180 tablet 3   tamsulosin (FLOMAX) 0.4 MG CAPS capsule Take 1 capsule (0.4 mg total) by mouth daily. 90 capsule 3   furosemide (LASIX) 20 MG tablet Take 20 mg by mouth daily.     pantoprazole (PROTONIX) 40 MG tablet Take 40 mg by mouth daily. (Patient not taking: Reported on 03/10/2022)     potassium chloride SA (KLOR-CON M20) 20 MEQ tablet Take 1 tablet (20 mEq total) by mouth daily. 90 tablet 3   spironolactone (ALDACTONE) 25 MG tablet Take 0.5 tablets (12.5 mg total) by mouth daily. 15 tablet 6   No current facility-administered medications for this visit.    PAST MEDICAL HISTORY: Past Medical History:  Diagnosis Date   Allergic rhinitis due to pollen    on allergy shots   Arrhythmia    1980s hospitalized for HTN and ?irregular rhythm but no specific dx but resolved during hospitalization   Diabetes mellitus without complication ([place])    HTN (hypertension)    Hyperlipidemia     PAST SURGICAL HISTORY: Past Surgical History:  Procedure Laterality Date   ANTERIOR CERVICAL DECOMP/DISCECTOMY FUSION N/A 06/03/2021  Procedure: Anterior Cervical Decompression Fusion  Cervical three-four;  Surgeon: Vallarie Mare, MD;  Location: [facility];  Service: Neurosurgery;  Laterality: N/A;   colonoscopy  2005   Dr. Gala Romney: normal rectum, sigmoid diverticula in colonic mucosa   COLONOSCOPY N/A 02/15/2014   Procedure: COLONOSCOPY;  Surgeon: Daneil Dolin, MD;  [facility];  Service: Endoscopy;  Laterality: N/A;  2:00   RIGHT/LEFT HEART CATH AND CORONARY ANGIOGRAPHY N/A 05/23/2021   Procedure: RIGHT/LEFT HEART CATH AND CORONARY ANGIOGRAPHY;  Surgeon: Jolaine Artist, MD;  Location: [facility];  Service: Cardiovascular;  Laterality: N/A;    FAMILY HISTORY: Family History  Problem Relation Age of Onset   Cancer Mother     Cirrhosis Father        cirrhosis of the liver; alcohol related   Diabetes Sister    Other Other        neice was on allergy vaccine when young   Colon cancer Neg Hx     SOCIAL HISTORY: Social History   Socioeconomic History   Marital status: Single    Spouse name: Not on file   Number of children: Not on file   Years of education: Not on file   Highest education level: Not on file  Occupational History   Occupation: school counselor  Tobacco Use   Smoking status: Never   Smokeless tobacco: Never  Vaping Use   Vaping Use: Never used  Substance and Sexual Activity   Alcohol use: No   Drug use: No   Sexual activity: Yes  Other Topics Concern   Not on file  Social History [place], [place]   Lives in [place] Determinants of Health   Financial Resource Strain: Not on file  Food Insecurity: Not on file  Transportation Needs: Not on file  Physical Activity: Not on file  Stress: Not on file  Social Connections: Not on file  Intimate Partner Violence: Not on file      Marcial Pacas, M.D. Ph.D.  [facility] [address] [place], [place] [address] Ph: [phone] Fax: [phone]  CC:  Justice Britain, MD [address] [place],  [place] [address]  [name], MD    Total time spent reviewing the chart, obtaining history, examined patient, ordering tests, documentation, consultations and family, care coordination was 7 minutes

## 2022-03-17 ENCOUNTER — Ambulatory Visit: Payer: Medicare Other | Admitting: Neurology

## 2022-03-17 ENCOUNTER — Ambulatory Visit: Payer: BC Managed Care – PPO | Admitting: Cardiology

## 2022-03-18 DIAGNOSIS — G4733 Obstructive sleep apnea (adult) (pediatric): Secondary | ICD-10-CM | POA: Diagnosis not present

## 2022-03-19 DIAGNOSIS — B351 Tinea unguium: Secondary | ICD-10-CM | POA: Diagnosis not present

## 2022-03-20 ENCOUNTER — Ambulatory Visit: Payer: BC Managed Care – PPO | Admitting: Cardiology

## 2022-03-20 ENCOUNTER — Encounter: Payer: Self-pay | Admitting: Cardiology

## 2022-03-20 ENCOUNTER — Ambulatory Visit: Payer: BC Managed Care – PPO | Attending: Internal Medicine | Admitting: Cardiology

## 2022-03-20 ENCOUNTER — Telehealth: Payer: Self-pay | Admitting: Neurology

## 2022-03-20 VITALS — BP 136/80 | HR 75 | Ht 69.0 in | Wt 229.6 lb

## 2022-03-20 DIAGNOSIS — G4733 Obstructive sleep apnea (adult) (pediatric): Secondary | ICD-10-CM | POA: Insufficient documentation

## 2022-03-20 DIAGNOSIS — I10 Essential (primary) hypertension: Secondary | ICD-10-CM | POA: Diagnosis not present

## 2022-03-20 NOTE — Progress Notes (Signed)
Sleep Medicine CONSULT Note    Date:  03/20/2022   ID:  Andrew Arnold, DOB [birth year], MRN [id number]  PCP:  [name], MD  Cardiologist: Glori Bickers, MD   No chief complaint on file.   History of Present Illness:  Andrew Arnold is a 66 y.o. male who is being seen today for the evaluation of obstructive sleep apnea at the request of Glori Bickers, MD.  This is a 66 year old male with a history of hypertension, diabetes, hyperlipidemia, not a doctor CAD, obesity and chronic diastolic heart failure followed in advanced heart failure clinic.  He was seen by Dr. Haroldine Laws back in April and complained of problems with snoring.  Due to his heart failure at home sleep study was ordered.  Home sleep study showed mild obstructive sleep apnea with an AHI of 10.8/h with oxygen saturations as low as 85%.  There was moderate snoring noted.  He was started on auto CPAP from 4 to 15 cm H2O and is now here for follow-up.  He is doing well with his PAP device and thinks that he has gotten used to it.  He tolerates the mask and feels the pressure is adequate. He denies any significant mouth or nasal dryness or nasal congestion.  He does not  know if he snores.  He really cannot tell if he feels more rested in the am.  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   Past Medical History:  Diagnosis Date   Allergic rhinitis due to pollen    on allergy shots   Arrhythmia    1980s hospitalized for HTN and ?irregular rhythm but no specific dx but resolved during hospitalization   Diabetes mellitus without complication ([facility])    HTN (hypertension)    Hyperlipidemia     Past Surgical History:  Procedure Laterality Date   ANTERIOR CERVICAL DECOMP/DISCECTOMY FUSION N/A 06/03/2021   Procedure: Anterior Cervical Decompression Fusion  Cervical three-four;  Surgeon: Vallarie Mare, MD;  Location: [facility];  Service: Neurosurgery;   Laterality: N/A;   colonoscopy  2005   Dr. Gala Romney: normal rectum, sigmoid diverticula in colonic mucosa   COLONOSCOPY N/A 02/15/2014   Procedure: COLONOSCOPY;  Surgeon: Daneil Dolin, MD;  [facility];  Service: Endoscopy;  Laterality: N/A;  2:00   RIGHT/LEFT HEART CATH AND CORONARY ANGIOGRAPHY N/A 05/23/2021   Procedure: RIGHT/LEFT HEART CATH AND CORONARY ANGIOGRAPHY;  Surgeon: Jolaine Artist, MD;  Location: [facility];  Service: Cardiovascular;  Laterality: N/A;    Current Medications: Current Meds  Medication Sig   amLODipine (NORVASC) 10 MG tablet Take 1 tablet (10 mg total) by mouth daily.   ascorbic acid (VITAMIN C) 500 MG tablet Take 500 mg by mouth every morning.    aspirin 81 MG chewable tablet Chew 1 tablet (81 mg total) by mouth daily.   atorvastatin (LIPITOR) 80 MG tablet Take 80 mg by mouth at bedtime.   B Complex Vitamins (B COMPLEX 100 PO) Take 1 tablet by mouth every morning.    carvedilol (COREG) 6.25 MG tablet Take 1 tablet (6.25 mg total) by mouth 2 (two) times daily with a meal.   cetirizine (ZYRTEC) 10 MG tablet Take 10 mg by mouth in the morning.   dapagliflozin propanediol (FARXIGA) 10 MG TABS tablet Take 1 tablet (10 mg total) by mouth daily before breakfast.   metFORMIN (GLUCOPHAGE)  1000 MG tablet Take 1,000 mg by mouth 2 (two) times daily.   pantoprazole (PROTONIX) 40 MG tablet Take 40 mg by mouth daily.   sacubitril-valsartan (ENTRESTO) 24-26 MG Take 1 tablet by mouth 2 (two) times daily.   spironolactone (ALDACTONE) 25 MG tablet Take 0.5 tablets (12.5 mg total) by mouth daily.   tamsulosin (FLOMAX) 0.4 MG CAPS capsule Take 1 capsule (0.4 mg total) by mouth daily.    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   ROS:   Please see the history of present illness.    ROS All other systems reviewed and are negative.      No data to display             PHYSICAL EXAM:   VS:  BP 136/80   Pulse 75   Ht '5\' 9"'$  (1.753 m)   Wt 229 lb 9.6 oz (104.1 kg)   SpO2 99%   BMI 33.91 kg/m    GEN: Well nourished, well developed, in no acute distress  HEENT: normal  Neck: no JVD, carotid bruits, or masses Cardiac: RRR; no murmurs, rubs, or gallops,no edema.  Intact distal pulses bilaterally.  Respiratory:  clear to auscultation bilaterally, normal work of breathing GI: soft, nontender, nondistended, + BS MS: no deformity or atrophy  Skin: warm and dry, no rash Neuro:  Alert and Oriented x 3, Strength and sensation are intact Psych: euthymic mood, full affect  Wt Readings from Last 3 Encounters:  03/20/22 229 lb 9.6 oz (104.1 kg)  03/10/22 229 lb 8 oz (104.1 kg)  01/29/22 221 lb (100.2 kg)      Studies/Labs Reviewed:   Home sleep study and PAP compliance download  Recent Labs: 08/05/2021: ALT 10 08/13/2021: B Natriuretic Peptide 77.4 09/24/2021: Magnesium 1.5 11/08/2021: BUN 14; Creatinine, Ser 0.96; Hemoglobin 10.4; Platelets 225; Potassium 3.5; Sodium 138     CHA2DS2-VASc Score =     This indicates a  % annual risk of stroke. The patient's score is based upon:       Additional studies/ records that were reviewed today include:  Office notes from Dr. Haroldine Laws    ASSESSMENT:    1. OSA (obstructive sleep apnea)   2. Essential hypertension      PLAN:  In order of problems listed above:  OSA  -mild obstructive  sleep apnea with an AHI of 10.8/h with oxygen saturations as low as 85% -Stop Bang score 6 -The patient is tolerating PAP therapy well without any problems. The PAP download performed by his DME was personally reviewed and interpreted by me today and showed an AHI of 0.3/hr on auto CPAP from 4-15 cm H2O with 3% compliance in using more than 4 hours nightly.  The patient has been using and benefiting from PAP use and will continue to benefit from therapy.  -I encouraged him to get back using his device nightly and aim for as many hours as he can but at least 5hr/night of insurance will make him hand it back in  2.  Hypertension -BP controlled on exam today -Continue prescription drug management with amlodipine 10 mg daily, carvedilol 6.25 mg twice daily, Entresto 24-26 mg twice daily and spironolactone 12.5 mg daily with as needed refills  Time Spent: 20 minutes total time of encounter, including 15 minutes spent in face-to-face patient care on the date of this encounter. This time includes coordination of care and counseling regarding above mentioned problem list. Remainder of non-face-to-face time involved reviewing chart documents/testing relevant to the patient encounter and documentation in the medical record. I have independently reviewed documentation from referring provider  Medication Adjustments/Labs and Tests Ordered: Current medicines are reviewed at length with the patient today.  Concerns regarding medicines are outlined above.  Medication changes, Labs and Tests ordered today are listed in the Patient Instructions  below.  There are no Patient Instructions on file for this visit.   Signed, Fransico Him, MD  03/20/2022 2:24 PM    [address], [place], [place]  [address] Phone: [phone]; Fax: [phone]

## 2022-03-20 NOTE — Patient Instructions (Signed)
Medication Instructions:  Your physician recommends that you continue on your current medications as directed. Please refer to the Current Medication list given to you today.  *If you need a refill on your cardiac medications before your next appointment, please call your pharmacy*  Follow-Up: At [facility], you and your health needs are our priority.  As part of our continuing mission to provide you with exceptional heart care, we have created designated Provider Care Teams.  These Care Teams include your primary Cardiologist (physician) and Advanced Practice Providers (APPs -  Physician Assistants and Nurse Practitioners) who all work together to provide you with the care you need, when you need it.  Your next appointment:   1 year(s)  The format for your next appointment:   In Person  Provider:   Traci Turner, MD     Important Information About Sugar       

## 2022-03-20 NOTE — Telephone Encounter (Signed)
[employer] NPR via [employer] NPR sent to [facility]

## 2022-03-24 ENCOUNTER — Ambulatory Visit (INDEPENDENT_AMBULATORY_CARE_PROVIDER_SITE_OTHER): Payer: BC Managed Care – PPO | Admitting: Gastroenterology

## 2022-03-24 ENCOUNTER — Encounter: Payer: Self-pay | Admitting: Gastroenterology

## 2022-03-24 ENCOUNTER — Telehealth: Payer: Self-pay | Admitting: *Deleted

## 2022-03-24 DIAGNOSIS — Z86010 Personal history of colonic polyps: Secondary | ICD-10-CM

## 2022-03-24 DIAGNOSIS — D649 Anemia, unspecified: Secondary | ICD-10-CM

## 2022-03-24 NOTE — Progress Notes (Signed)
GI Office Note    Referring Provider: [name], MD Primary Care Physician:  [name], MD  Primary Gastroenterologist: Garfield Cornea, MD   Chief Complaint   Chief Complaint  Patient presents with   Colonoscopy    History of Present Illness   Andrew Arnold is a 66 y.o. male presenting today at the request of Dr. Karie Kirks for colonoscopy. He was due for surveillance colonoscopy last year.  Last colonoscopy in 2015 with left-sided diverticulosis, single tubular adenoma removed. Advised to have 7-year surveillance exam.  Patient was actually scheduled for colonoscopy last year but he had to postpone procedure for other medical issues.  Completed cardiac catheterization last year.  Moderate nonobstructive coronary artery disease discovered.  Other cardiac work-up for syncopal episode.  Carotid Dopplers negative.  Echo EF 65 to 70%.  History of chronic heart failure with preserved EF.  Underwent cervical disc surgery November 2022.   He has had some mild normocytic anemia with Hgb in the 10 range. Last Hgb in 10/2021. He denies melena, brbpr. No constipation, diarrhea, abd pain, vomiting, heartburn, dysphagia.   Medications   Current Outpatient Medications  Medication Sig Dispense Refill   amLODipine (NORVASC) 10 MG tablet Take 1 tablet (10 mg total) by mouth daily. 30 tablet 0   ascorbic acid (VITAMIN C) 500 MG tablet Take 500 mg by mouth every morning.      aspirin 81 MG chewable tablet Chew 1 tablet (81 mg total) by mouth daily. 30 tablet 0   atorvastatin (LIPITOR) 80 MG tablet Take 80 mg by mouth at bedtime.     B Complex Vitamins (B COMPLEX 100 PO) Take 1 tablet by mouth every morning.      carvedilol (COREG) 6.25 MG tablet Take 1 tablet (6.25 mg total) by mouth 2 (two) times daily with a meal. 60 tablet 0   cetirizine (ZYRTEC) 10 MG tablet Take 10 mg by mouth in the morning.     cyanocobalamin (VITAMIN B12) 1000 MCG tablet Take 1,000 mcg by mouth daily.      dapagliflozin propanediol (FARXIGA) 10 MG TABS tablet Take 1 tablet (10 mg total) by mouth daily before breakfast. 90 tablet 3   metFORMIN (GLUCOPHAGE) 1000 MG tablet Take 1,000 mg by mouth 2 (two) times daily.     pantoprazole (PROTONIX) 40 MG tablet Take 40 mg by mouth daily.     potassium chloride SA (KLOR-CON M20) 20 MEQ tablet Take 1 tablet (20 mEq total) by mouth daily. 90 tablet 3   sacubitril-valsartan (ENTRESTO) 24-26 MG Take 1 tablet by mouth 2 (two) times daily. 180 tablet 3   spironolactone (ALDACTONE) 25 MG tablet Take 0.5 tablets (12.5 mg total) by mouth daily. 15 tablet 6   tamsulosin (FLOMAX) 0.4 MG CAPS capsule Take 1 capsule (0.4 mg total) by mouth daily. 90 capsule 3   No current facility-administered medications for this visit.    Allergies   Allergies as of 03/24/2022 - Review Complete 03/24/2022  Allergen Reaction Noted   Penicillins  09/17/2021    Past Medical History   Past Medical History:  Diagnosis Date   Allergic rhinitis due to pollen    on allergy shots   Arrhythmia    1980s hospitalized for HTN and ?irregular rhythm but no specific dx but resolved during hospitalization   Diabetes mellitus without complication ([place])    HTN (hypertension)    Hyperlipidemia     Past Surgical History   Past Surgical History:  Procedure  Laterality Date   ANTERIOR CERVICAL DECOMP/DISCECTOMY FUSION N/A 06/03/2021   Procedure: Anterior Cervical Decompression Fusion  Cervical three-four;  Surgeon: Vallarie Mare, MD;  Location: [facility];  Service: Neurosurgery;  Laterality: N/A;   colonoscopy  2005   Dr. Gala Romney: normal rectum, sigmoid diverticula in colonic mucosa   COLONOSCOPY N/A 02/15/2014   Procedure: COLONOSCOPY;  Surgeon: Daneil Dolin, MD;  [facility];  Service: Endoscopy;  Laterality: N/A;  2:00   RIGHT/LEFT HEART CATH AND CORONARY ANGIOGRAPHY N/A 05/23/2021   Procedure: RIGHT/LEFT HEART CATH AND CORONARY ANGIOGRAPHY;  Surgeon: Jolaine Artist,  MD;  Location: [facility];  Service: Cardiovascular;  Laterality: N/A;    Past Family History   Family History  Problem Relation Age of Onset   Cancer Mother    Cirrhosis Father        cirrhosis of the liver; alcohol related   Diabetes Sister    Other Other        neice was on allergy vaccine when young   Colon cancer Neg Hx     Past Social History   Social History   Socioeconomic History   Marital status: Single    Spouse name: Not on file   Number of children: Not on file   Years of education: Not on file   Highest education level: Not on file  Occupational History   Occupation: school counselor  Tobacco Use   Smoking status: Never   Smokeless tobacco: Never  Vaping Use   Vaping Use: Never used  Substance and Sexual Activity   Alcohol use: No   Drug use: No   Sexual activity: Yes  Other Topics Concern   Not on file  Social History [place], [place]   Lives in [place] Determinants of Health   Financial Resource Strain: Not on file  Food Insecurity: Not on file  Transportation Needs: Not on file  Physical Activity: Not on file  Stress: Not on file  Social Connections: Not on file  Intimate Partner Violence: Not on file    Review of Systems   General: Negative for anorexia, weight loss, fever, chills, fatigue, weakness. Eyes: Negative for vision changes.  ENT: Negative for hoarseness, difficulty swallowing , nasal congestion. CV: Negative for chest pain, angina, palpitations, dyspnea on exertion, peripheral edema.  Respiratory: Negative for dyspnea at rest, dyspnea on exertion, cough, sputum, wheezing.  GI: See history of present illness. GU:  Negative for dysuria, hematuria, urinary incontinence, urinary frequency, nocturnal urination.  MS: +shoulder pain. no low back pain.  Derm: Negative for rash or itching.  Neuro: Negative for weakness, abnormal sensation, seizure, frequent headaches, memory  loss,  confusion.  Psych: Negative for anxiety, depression, suicidal ideation, hallucinations.  Endo: Negative for unusual weight change.  Heme: Negative for bruising or bleeding. Allergy: Negative for rash or hives.  Physical Exam   BP 133/81 (BP Location: Right Arm, Patient Position: Sitting, Cuff Size: Large)   Pulse 71   Temp (!) 97.5 F (36.4 C) (Temporal)   Ht '5\' 9"'$  (1.753 m)   Wt 231 lb 9.6 oz (105.1 kg)   SpO2 98%   BMI 34.20 kg/m    General: Well-nourished, well-developed in no acute distress.  Head: Normocephalic, atraumatic.   Eyes: Conjunctiva pink, no icterus. Mouth: Oropharyngeal mucosa moist and pink , no lesions erythema or exudate. Neck: Supple without thyromegaly, masses,  or lymphadenopathy.  Lungs: Clear to auscultation bilaterally.  Heart: Regular rate and rhythm, no murmurs rubs or gallops.  Abdomen: Bowel sounds are normal, nontender, nondistended, no hepatosplenomegaly or masses,  no abdominal bruits or hernia, no rebound or guarding.   Rectal: not performed Extremities: No lower extremity edema. No clubbing or deformities.  Neuro: Alert and oriented x 4 , grossly normal neurologically.  Skin: Warm and dry, no rash or jaundice.   Psych: Alert and cooperative, normal mood and affect.  Labs   Lab Results  Component Value Date   CREATININE 0.96 11/08/2021   BUN 14 11/08/2021   NA 138 11/08/2021   K 3.5 11/08/2021   CL 104 11/08/2021   CO2 25 11/08/2021   Lab Results  Component Value Date   ALT 10 08/05/2021   AST 14 (L) 08/05/2021   ALKPHOS 92 08/05/2021   BILITOT 0.3 08/05/2021   Lab Results  Component Value Date   WBC 4.1 11/08/2021   HGB 10.4 (L) 11/08/2021   HCT 31.8 (L) 11/08/2021   MCV 95.5 11/08/2021   PLT 225 11/08/2021   No results found for: "IRON", "TIBC", "FERRITIN" Lab Results  Component Value Date   VITAMINB12 220 05/15/2021   Lab Results  Component Value Date   FOLATE 17.4 05/15/2021    Imaging Studies   No  results found.  Assessment   H/O adenomatous colon polyp: overdue for surveillance colonoscopy. No GI complaints.  H/O normocytic anemia: noted on several occasions while hospitalized. No recent labs available. Denies overt GI bleeding. Update during pre-op.   PLAN   Colonoscopy with Dr. Gala Romney. ASA 3.  I have discussed the risks, alternatives, benefits with regards to but not limited to the risk of reaction to medication, bleeding, infection, perforation and the patient is agreeable to proceed. Written consent to be obtained.    Laureen Ochs. Bobby Rumpf, [place], [facility]

## 2022-03-24 NOTE — Patient Instructions (Signed)
Colonoscopy to be scheduled. See separate instructions.  ?

## 2022-03-24 NOTE — Telephone Encounter (Signed)
Pt has suprep left from 03/2021. He is going to check the expiration date on this and call back

## 2022-03-26 ENCOUNTER — Encounter: Payer: Self-pay | Admitting: *Deleted

## 2022-03-26 MED ORDER — PEG 3350-KCL-NA BICARB-NACL 420 G PO SOLR: 4000.0000 mL | Freq: Once | ORAL | 0 refills | Status: AC

## 2022-03-27 DIAGNOSIS — Z86010 Personal history of colonic polyps: Secondary | ICD-10-CM | POA: Insufficient documentation

## 2022-03-27 DIAGNOSIS — D649 Anemia, unspecified: Secondary | ICD-10-CM | POA: Insufficient documentation

## 2022-04-02 ENCOUNTER — Telehealth: Payer: Self-pay | Admitting: *Deleted

## 2022-04-02 NOTE — Telephone Encounter (Signed)
Patient states he stopped using his cpap because it was irritating him at night and now his dme is asking him to return his unit for non-compliance. His dme informed him he can start paying $150 privately for his machine or return the unit. He wanted to keep his unit and has started back using it. Patient says he can not afford to pay for the machine so he will return it to [facility].  Patient will call back with his decision to restart or not. Patient will have to return the machine and the process has be restarted. Face to face encounter, new sleep study and new order is needed.

## 2022-04-02 NOTE — Telephone Encounter (Signed)
New prep was sent to the pharmacy.

## 2022-04-23 ENCOUNTER — Encounter: Payer: Self-pay | Admitting: Cardiology

## 2022-04-23 ENCOUNTER — Ambulatory Visit: Payer: BC Managed Care – PPO | Attending: Cardiology | Admitting: Cardiology

## 2022-04-23 ENCOUNTER — Telehealth: Payer: Self-pay | Admitting: *Deleted

## 2022-04-23 VITALS — BP 143/76 | HR 69 | Ht 69.0 in | Wt 228.8 lb

## 2022-04-23 DIAGNOSIS — I10 Essential (primary) hypertension: Secondary | ICD-10-CM | POA: Diagnosis not present

## 2022-04-23 DIAGNOSIS — G4733 Obstructive sleep apnea (adult) (pediatric): Secondary | ICD-10-CM | POA: Diagnosis not present

## 2022-04-23 NOTE — Addendum Note (Signed)
Addended by: Antonieta Iba on: 04/23/2022 09:29 AM   Modules accepted: Orders

## 2022-04-23 NOTE — Telephone Encounter (Signed)
Left message for the pt to call back and ask to s/w me in regard to Itamar home sleep study.

## 2022-04-23 NOTE — Progress Notes (Signed)
  STOP BANG RISK ASSESSMENT S (snore) Have you been told that you snore?     YES   T (tired) Are you often tired, fatigued, or sleepy during the day?   YES  O (obstruction) Do you stop breathing, choke, or gasp during sleep? YES   P (pressure) Do you have or are you being treated for high blood pressure? YES   B (BMI) Is your body index greater than 35 kg/m? NO   A (age) Are you 68 years old or older? YES   N (neck) Do you have a neck circumference greater than 16 inches?   YES   G (gender) Are you a male? YES   TOTAL STOP/BANG "YES" ANSWERS 7                                                                       For Office Use Only              Procedure Order Form    YES to 3+ Stop Bang questions OR two clinical symptoms - patient qualifies for WatchPAT (CPT 95800)             Clinical Notes: Will consult Sleep Specialist and refer for management of therapy due to patient increased risk of Sleep Apnea. Ordering a sleep study due to the following two clinical symptoms: Excessive daytime sleepiness G47.10 / Gastroesophageal reflux K21.9 / Nocturia R35.1 / Morning Headaches G44.221 / Difficulty concentrating R41.840 / Memory problems or poor judgment G31.84 / Personality changes or irritability R45.4 / Loud snoring R06.83 / Depression F32.9 / Unrefreshed by sleep G47.8 / Impotence N52.9 / History of high blood pressure R03.0 / Insomnia G47.00    I understand that I am proceeding with a home sleep apnea test as ordered by my treating physician. I understand that untreated sleep apnea is a serious cardiovascular risk factor and it is my responsibility to perform the test and seek management for sleep apnea. I will be contacted with the results and be managed for sleep apnea by a local sleep physician. I will be receiving equipment and further instructions from [facility]. I shall promptly ship back the equipment via the included mailing label. I understand my insurance will be billed for  the test and as the patient I am responsible for any insurance related out-of-pocket costs incurred. I have been provided with written instructions and can call for additional video or telephonic instruction, with 24-hour availability of qualified personnel to answer any questions: Patient Help Desk [phone].   Patient Telemedicine Verbal Consent

## 2022-04-23 NOTE — Patient Instructions (Signed)
Medication Instructions:  Your physician recommends that you continue on your current medications as directed. Please refer to the Current Medication list given to you today.  *If you need a refill on your cardiac medications before your next appointment, please call your pharmacy*   Testing/Procedures: Your physician has recommended that you have a sleep study. This test records several body functions during sleep, including: brain activity, eye movement, oxygen and carbon dioxide blood levels, heart rate and rhythm, breathing rate and rhythm, the flow of air through your mouth and nose, snoring, body muscle movements, and chest and belly movement.    Follow-Up: At [facility], you and your health needs are our priority.  As part of our continuing mission to provide you with exceptional heart care, we have created designated Provider Care Teams.  These Care Teams include your primary Cardiologist (physician) and Advanced Practice Providers (APPs -  Physician Assistants and Nurse Practitioners) who all work together to provide you with the care you need, when you need it.  Follow up will be based on results of sleep study

## 2022-04-23 NOTE — Progress Notes (Signed)
Virtual Sleep Medicine Visit via Video Note   Because of [name] co-morbid illnesses, he is at least at moderate risk for complications without adequate follow up.  This format is felt to be most appropriate for this patient at this time.  All issues noted in this document were discussed and addressed.  A limited physical exam was performed with this format.  Please refer to the patient's chart for his consent to telehealth for [facility].   Date:  04/23/2022   ID:  Andrew Arnold, DOB [birth year], MRN [id number] The patient was identified using 2 identifiers.  Patient Location: Home Provider Location: Home Office   PCP:  Andrew Evens, MD   [place] Providers Cardiologist: Andrew Bickers, MD  Sleep Medicine:  Andrew Him, MD     Evaluation Performed:  Follow-Up Visit  Chief Complaint  Patient presents with   Sleep Apnea   Hypertension    History of Present Illness:  Andrew Arnold is a 66 y.o. male with a history of hypertension, diabetes, hyperlipidemia, not a doctor CAD, obesity and chronic diastolic heart failure followed in advanced heart failure clinic.  He was seen by Dr. Haroldine Arnold back in April and complained of problems with snoring.  Due to his heart failure a home sleep study was ordered.  Home sleep study showed mild obstructive sleep apnea with an AHI of 10.8/h with oxygen saturations as low as 85%.  There was moderate snoring noted.  He was started on auto CPAP from 4 to 15 cm H2O and is now here for follow-up.  At last OV he was not compliant as he was very busy and we discussed trying use it more often.  Unfortunately before he got a chance to get more compliance he had to turn his device back in due to noncompliance. He is interested in getting back on CPAP but will need another sleep study prior to insurance paying for another device.   He continues to get up to go to the bathroom 3-4 times nightly likely related to  the OSA.    Past Medical History:  Diagnosis Date   Allergic rhinitis due to pollen    on allergy shots   Arrhythmia    1980s hospitalized for HTN and ?irregular rhythm but no specific dx but resolved during hospitalization   Diabetes mellitus without complication ([place])    HTN (hypertension)    Hyperlipidemia    OSA (obstructive sleep apnea)    mild obstructive sleep apnea with an AHI of 10.8/h with oxygen saturations as low as 85%.    Past Surgical History:  Procedure Laterality Date   ANTERIOR CERVICAL DECOMP/DISCECTOMY FUSION N/A 06/03/2021   Procedure: Anterior Cervical Decompression Fusion  Cervical three-four;  Surgeon: Andrew Mare, MD;  Location: [facility];  Service: Neurosurgery;  Laterality: N/A;   colonoscopy  2005   Dr. Gala Arnold: normal rectum, sigmoid diverticula in colonic mucosa   COLONOSCOPY N/A 02/15/2014   Procedure: COLONOSCOPY;  Surgeon: Andrew Dolin, MD;  [facility];  Service: Endoscopy;  Laterality: N/A;  2:00   RIGHT/LEFT HEART CATH AND CORONARY ANGIOGRAPHY N/A 05/23/2021   Procedure: RIGHT/LEFT HEART CATH AND CORONARY ANGIOGRAPHY;  Surgeon: Andrew Artist, MD;  Location: [facility];  Service: Cardiovascular;  Laterality: N/A;    Current Medications: Current Meds  Medication Sig   amLODipine (NORVASC) 10 MG tablet Take 1 tablet (10 mg total) by mouth daily.   ascorbic  acid (VITAMIN C) 500 MG tablet Take 500 mg by mouth every morning.    aspirin 81 MG chewable tablet Chew 1 tablet (81 mg total) by mouth daily.   atorvastatin (LIPITOR) 80 MG tablet Take 80 mg by mouth at bedtime.   B Complex Vitamins (B COMPLEX 100 PO) Take 1 tablet by mouth every morning.    carvedilol (COREG) 6.25 MG tablet Take 1 tablet (6.25 mg total) by mouth 2 (two) times daily with a meal.   cetirizine (ZYRTEC) 10 MG tablet Take 10 mg by mouth in the morning.   cyanocobalamin (VITAMIN B12) 1000 MCG tablet Take 1,000 mcg by mouth daily.   dapagliflozin  propanediol (FARXIGA) 10 MG TABS tablet Take 1 tablet (10 mg total) by mouth daily before breakfast.   metFORMIN (GLUCOPHAGE) 1000 MG tablet Take 1,000 mg by mouth 2 (two) times daily.   pantoprazole (PROTONIX) 40 MG tablet Take 40 mg by mouth daily.   sacubitril-valsartan (ENTRESTO) 24-26 MG Take 1 tablet by mouth 2 (two) times daily.   spironolactone (ALDACTONE) 25 MG tablet Take 0.5 tablets (12.5 mg total) by mouth daily.   tamsulosin (FLOMAX) 0.4 MG CAPS capsule Take 1 capsule (0.4 mg total) by mouth daily.    Allergies:   Penicillins   Social History   Socioeconomic History   Marital status: Single    Spouse name: Not on file   Number of children: Not on file   Years of education: Not on file   Highest education level: Not on file  Occupational History   Occupation: school counselor  Tobacco Use   Smoking status: Never   Smokeless tobacco: Never  Vaping Use   Vaping Use: Never used  Substance and Sexual Activity   Alcohol use: No   Drug use: No   Sexual activity: Yes  Other Topics Concern   Not on file  Social History Narrative   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[place], [place]   Lives in [place] Determinants of Health   Financial Resource Strain: Not on file  Food Insecurity: Not on file  Transportation Needs: Not on file  Physical Activity: Not on file  Stress: Not on file  Social Connections: Not on file     Family History:  The patient's family history includes Cancer in his mother; Cirrhosis in his father; Diabetes in his sister; Other in an other family member.   ROS:   Please see the history of present illness.    ROS All other systems reviewed and are negative.      No data to display             PHYSICAL EXAM:   VS:  BP (!) 143/76   Pulse 69   Ht '5\' 9"'$  (1.753 m)   Wt 228 lb 12.8 oz (103.8 kg)   BMI 33.79 kg/m    Well nourished, well developed male in no acute distress. Well appearing, alert and conversant, regular work of  breathing,  good skin color  Eyes- anicteric mouth- oral mucosa is pink  neuro- grossly intact skin- no apparent rash or lesions or cyanosis  Wt Readings from Last 3 Encounters:  04/23/22 228 lb 12.8 oz (103.8 kg)  03/24/22 231 lb 9.6 oz (105.1 kg)  03/20/22 229 lb 9.6 oz (104.1 kg)      Studies/Labs Reviewed:   Home sleep study and PAP compliance download  Recent Labs: 08/05/2021: ALT 10 08/13/2021: B Natriuretic Peptide  77.4 09/24/2021: Magnesium 1.5 11/08/2021: BUN 14; Creatinine, Ser 0.96; Hemoglobin 10.4; Platelets 225; Potassium 3.5; Sodium 138    CHA2DS2-VASc Score =     This indicates a  % annual risk of stroke. The patient's score is based upon:       Additional studies/ records that were reviewed today include:  Office notes from Dr. Haroldine Arnold    ASSESSMENT:    1. OSA (obstructive sleep apnea)   2. Essential hypertension      PLAN:  In order of problems listed above:  OSA  -Unfortunately he was noncompliant with his PAP device and had to turn it in -he says that he thinks he can be compliant if he tries again -will order an Itamar home sleep study to reevaluate and get started back on CPAP -he is not a candidate for Inspire as his AHI was < 15 and the oral device is cost prohibitive  Hypertension -BP is borderline controlled on exam today -continue prescription drug management with Amlodipine '10mg'$  daily, Carvedilol 6.'25mg'$  BID, Entresto 24-'26mg'$  BID and spiro 12.'5mg'$  daily with PRN refills mg twice daily, Entresto 24-26 mg twice daily and spironolactone 12.5 mg daily with as needed refills  Time Spent: 20 minutes total time of encounter, including 15 minutes spent in virtual face-to-face patient care on the date of this encounter. This time includes coordination of care and counseling regarding above mentioned problem list. Remainder of non-face-to-face time involved reviewing chart documents/testing relevant to the patient encounter and documentation in the  medical record. I have independently reviewed documentation from referring provider   Medication Adjustments/Labs and Tests Ordered: Current medicines are reviewed at length with the patient today.  Concerns regarding medicines are outlined above.  Medication changes, Labs and Tests ordered today are listed in the Patient Instructions below.  There are no Patient Instructions on file for this visit.   Signed, Andrew Him, MD  04/23/2022 9:13 AM    [address], [place], [place]  [address] Phone: [phone]; Fax: [phone]

## 2022-04-23 NOTE — Telephone Encounter (Signed)
-----   Message from Antonieta Iba, RN sent at 04/23/2022  9:29 AM EDT ----- Andrew Arnold study was ordered by Dr. Radford Pax today during a virtual visit. SB score was 7. Can you please call patient to set up a time to come in and pick study up?  Thank you! Carly

## 2022-04-27 NOTE — Telephone Encounter (Signed)
Pt called back and has agreed to come by the office 05/03/22 @ 2:30 for Itamar set up.

## 2022-04-28 DIAGNOSIS — B351 Tinea unguium: Secondary | ICD-10-CM | POA: Diagnosis not present

## 2022-04-29 NOTE — Telephone Encounter (Signed)
Prior Authorization for [facility] sent to [facility] via web portal. Tracking Number   NO PA REQ, REF# [id number].

## 2022-05-04 NOTE — Telephone Encounter (Signed)
Called and made the patient aware that he may proceed with the [facility] Sleep Study. PIN # provided to the patient. Patient made aware that he will be contacted after the test has been read with the results and any recommendations. Patient verbalized understanding and thanked me for the call.    Pt has been approved and has been set up with Itamar device. Pt states he will do his sleep study this week. Have been given PIN# [id number].

## 2022-05-04 NOTE — Telephone Encounter (Signed)
I tried to call the pt in regards to needing the serial number on the Itamar box. In my error today I forgot to write down the serial #. Pt will not be able to do his sleep study until I can register him.   I do apologize for any inconvenience this has caused the pt. I tried all the numbers multiple times hoping to reach the pt before I left the office today, so I could register his device.

## 2022-05-05 NOTE — Telephone Encounter (Signed)
Pt calling back

## 2022-05-05 NOTE — Patient Instructions (Signed)
Andrew Arnold  05/05/2022     '@PREFPERIOPPHARMACY'$ @   Your procedure is scheduled on  05/08/2022.   Report to [facility] at  0900  A.M.   Call this number if you have problems the morning of surgery:  [phone]  If you experience any cold or flu symptoms such as cough, fever, chills, shortness of breath, etc. between now and your scheduled surgery, please notify us at the above number.   Remember:  Follow the diet and prep instructions given to you by the office.     Take these medicines the morning of surgery with A SIP OF WATER                    amlodipine, carvedilol, protonix, entresto, flomax.     Do not wear jewelry, make-up or nail polish.  Do not wear lotions, powders, or perfumes, or deodorant.  Do not shave 48 hours prior to surgery.  Men may shave face and neck.  Do not bring valuables to the hospital.  [facility] is not responsible for any belongings or valuables.  Contacts, dentures or bridgework may not be worn into surgery.  Leave your suitcase in the car.  After surgery it may be brought to your room.  For patients admitted to the hospital, discharge time will be determined by your treatment team.  Patients discharged the day of surgery will not be allowed to drive home and must have someone with them for 24 hours.    Special instructions:   DO NOT smoke tobacco or vape for 24 hours before your procedure.  Please read over the following fact sheets that you were given. Anesthesia Post-op Instructions and Care and Recovery After Surgery      Colonoscopy, Adult, Care After The following information offers guidance on how to care for yourself after your procedure. Your health care provider may also give you more specific instructions. If you have problems or questions, contact your health care provider. What can I expect after the procedure? After the procedure, it is common to have: A small amount of blood in your stool for 24 hours  after the procedure. Some gas. Mild cramping or bloating of your abdomen. Follow these instructions at home: Eating and drinking  Drink enough fluid to keep your urine pale yellow. Follow instructions from your health care provider about eating or drinking restrictions. Resume your normal diet as told by your health care provider. Avoid heavy or fried foods that are hard to digest. Activity Rest as told by your health care provider. Avoid sitting for a long time without moving. Get up to take short walks every 1-2 hours. This is important to improve blood flow and breathing. Ask for help if you feel weak or unsteady. Return to your normal activities as told by your health care provider. Ask your health care provider what activities are safe for you. Managing cramping and bloating  Try walking around when you have cramps or feel bloated. If directed, apply heat to your abdomen as told by your health care provider. Use the heat source that your health care provider recommends, such as a moist heat pack or a heating pad. Place a towel between your skin and the heat source. Leave the heat on for 20-30 minutes. Remove the heat if your skin turns bright red. This is especially important if you are unable to feel pain, heat, or cold. You have a greater risk of getting  burned. General instructions If you were given a sedative during the procedure, it can affect you for several hours. Do not drive or operate machinery until your health care provider says that it is safe. For the first 24 hours after the procedure: Do not sign important documents. Do not drink alcohol. Do your regular daily activities at a slower pace than normal. Eat soft foods that are easy to digest. Take over-the-counter and prescription medicines only as told by your health care provider. Keep all follow-up visits. This is important. Contact a health care provider if: You have blood in your stool 2-3 days after the  procedure. Get help right away if: You have more than a small spotting of blood in your stool. You have large blood clots in your stool. You have swelling of your abdomen. You have nausea or vomiting. You have a fever. You have increasing pain in your abdomen that is not relieved with medicine. These symptoms may be an emergency. Get help right away. Call 911. Do not wait to see if the symptoms will go away. Do not drive yourself to the hospital. Summary After the procedure, it is common to have a small amount of blood in your stool. You may also have mild cramping and bloating of your abdomen. If you were given a sedative during the procedure, it can affect you for several hours. Do not drive or operate machinery until your health care provider says that it is safe. Get help right away if you have a lot of blood in your stool, nausea or vomiting, a fever, or increased pain in your abdomen. This information is not intended to replace advice given to you by your health care provider. Make sure you discuss any questions you have with your health care provider. Document Revised: 02/19/2021 Document Reviewed: 02/19/2021 Elsevier Patient Education  [place] After This sheet gives you information about how to care for yourself after your procedure. Your health care provider may also give you more specific instructions. If you have problems or questions, contact your health care provider. What can I expect after the procedure? After the procedure, it is common to have: Tiredness. Forgetfulness about what happened after the procedure. Impaired judgment for important decisions. Nausea or vomiting. Some difficulty with balance. Follow these instructions at home: For the time period you were told by your health care provider:     Rest as needed. Do not participate in activities where you could fall or become injured. Do not drive or use machinery. Do  not drink alcohol. Do not take sleeping pills or medicines that cause drowsiness. Do not make important decisions or sign legal documents. Do not take care of children on your own. Eating and drinking Follow the diet that is recommended by your health care provider. Drink enough fluid to keep your urine pale yellow. If you vomit: Drink water, juice, or soup when you can drink without vomiting. Make sure you have little or no nausea before eating solid foods. General instructions Have a responsible adult stay with you for the time you are told. It is important to have someone help care for you until you are awake and alert. Take over-the-counter and prescription medicines only as told by your health care provider. If you have sleep apnea, surgery and certain medicines can increase your risk for breathing problems. Follow instructions from your health care provider about wearing your sleep device: Anytime you are sleeping, including during daytime naps. While  taking prescription pain medicines, sleeping medicines, or medicines that make you drowsy. Avoid smoking. Keep all follow-up visits as told by your health care provider. This is important. Contact a health care provider if: You keep feeling nauseous or you keep vomiting. You feel light-headed. You are still sleepy or having trouble with balance after 24 hours. You develop a rash. You have a fever. You have redness or swelling around the IV site. Get help right away if: You have trouble breathing. You have new-onset confusion at home. Summary For several hours after your procedure, you may feel tired. You may also be forgetful and have poor judgment. Have a responsible adult stay with you for the time you are told. It is important to have someone help care for you until you are awake and alert. Rest as told. Do not drive or operate machinery. Do not drink alcohol or take sleeping pills. Get help right away if you have trouble  breathing, or if you suddenly become confused. This information is not intended to replace advice given to you by your health care provider. Make sure you discuss any questions you have with your health care provider. Document Revised: 06/03/2021 Document Reviewed: 06/01/2019 Elsevier Patient Education  [place].

## 2022-05-06 ENCOUNTER — Encounter (HOSPITAL_COMMUNITY)
Admission: RE | Admit: 2022-05-06 | Discharge: 2022-05-06 | Disposition: A | Discharge status: Home / Self Care | Payer: BC Managed Care – PPO | Source: Ambulatory Visit | Attending: Internal Medicine | Admitting: Internal Medicine

## 2022-05-06 ENCOUNTER — Encounter (HOSPITAL_COMMUNITY): Payer: Self-pay

## 2022-05-06 ENCOUNTER — Encounter (HOSPITAL_BASED_OUTPATIENT_CLINIC_OR_DEPARTMENT_OTHER): Payer: BC Managed Care – PPO | Admitting: Cardiology

## 2022-05-06 VITALS — BP 145/69 | HR 67 | Temp 97.5°F | Resp 18 | Ht 69.0 in | Wt 228.8 lb

## 2022-05-06 DIAGNOSIS — Z01812 Encounter for preprocedural laboratory examination: Secondary | ICD-10-CM | POA: Insufficient documentation

## 2022-05-06 DIAGNOSIS — E119 Type 2 diabetes mellitus without complications: Secondary | ICD-10-CM | POA: Insufficient documentation

## 2022-05-06 DIAGNOSIS — G4733 Obstructive sleep apnea (adult) (pediatric): Secondary | ICD-10-CM | POA: Diagnosis not present

## 2022-05-06 DIAGNOSIS — R0683 Snoring: Secondary | ICD-10-CM | POA: Diagnosis not present

## 2022-05-06 DIAGNOSIS — I2510 Atherosclerotic heart disease of native coronary artery without angina pectoris: Secondary | ICD-10-CM | POA: Diagnosis not present

## 2022-05-06 DIAGNOSIS — Z1211 Encounter for screening for malignant neoplasm of colon: Secondary | ICD-10-CM | POA: Diagnosis not present

## 2022-05-06 DIAGNOSIS — I10 Essential (primary) hypertension: Secondary | ICD-10-CM | POA: Diagnosis not present

## 2022-05-06 DIAGNOSIS — Z86010 Personal history of colonic polyps: Secondary | ICD-10-CM | POA: Diagnosis not present

## 2022-05-06 DIAGNOSIS — K5730 Diverticulosis of large intestine without perforation or abscess without bleeding: Secondary | ICD-10-CM | POA: Diagnosis not present

## 2022-05-06 DIAGNOSIS — K219 Gastro-esophageal reflux disease without esophagitis: Secondary | ICD-10-CM | POA: Diagnosis not present

## 2022-05-06 DIAGNOSIS — Z79899 Other long term (current) drug therapy: Secondary | ICD-10-CM | POA: Diagnosis not present

## 2022-05-06 LAB — BASIC METABOLIC PANEL
Anion gap: 7 (ref 5–15)
BUN: 21 mg/dL (ref 8–23)
CO2: 28 mmol/L (ref 22–32)
Calcium: 9.4 mg/dL (ref 8.9–10.3)
Chloride: 105 mmol/L (ref 98–111)
Creatinine, Ser: 1.08 mg/dL (ref 0.61–1.24)
GFR, Estimated: >60 mL/min (ref >60)
Glucose, Bld: 99 mg/dL (ref 70–99)
Potassium: 4.5 mmol/L (ref 3.5–5.1)
Sodium: 140 mmol/L (ref 135–145)

## 2022-05-07 NOTE — Procedures (Signed)
   Patient Information Study Date: 05/06/22 Patient Name: Andrew Arnold Patient ID: [id number] Birth Date: 01/20/2056 Age: 66 Gender: Male BMI: 34.0 (W=229 lb, H=5' 9'') Stopbang: 7 Referring Physician: Fransico Him, MD  TEST DESCRIPTION: Home sleep apnea testing was completed using the WatchPat, a Type 1 device, utilizing peripheral arterial tonometry (PAT), chest movement, actigraphy, pulse oximetry, pulse rate, body position and snore. AHI was calculated with apnea and hypopnea using valid sleep time as the denominator. RDI includes apneas, hypopneas, and RERAs. The data acquired and the scoring of sleep and all associated events were performed in accordance with the recommended standards and specifications as outlined in the AASM Manual for the Scoring of Sleep and Associated Events 2.2.0 (2015).   FINDINGS: 1.  No evidence of Obstructive Sleep Apnea with AHI 1.4/hr.  2.  No Central Sleep Apnea. 3.  Oxygen desaturations as low as 82%. 4.  Mild snoring was present. O2 sats were < 88% for 0 minutes. 5.  Total sleep time was 8 hrs and 47 min. 6. 18.5% of total sleep time was spent in REM sleep.  7.  Normal sleep onset latency at 17 min.  8.  Shortened REM sleep onset latency at 27 min.  9.  Total awakenings were 4.   DIAGNOSIS:  Normal study with no significant sleep disordered breathing.  RECOMMENDATIONS:   1. Normal study with no significant sleep disordered breathing.  2.  Healthy sleep recommendations include:  adequate nightly sleep (normal 7-9 hrs/night), avoidance of caffeine after noon and alcohol near bedtime, and maintaining a sleep environment that is cool, dark and quiet.  3.  Weight loss for overweight patients is recommended.    4.  Snoring recommendations include:  weight loss where appropriate, side sleeping, and avoidance of alcohol before bed.  5.  Operation of motor vehicle or dangerous equipment must be avoided when feeling drowsy, excessively sleepy, or  mentally fatigued.    6.  An ENT consultation which may be useful for specific causes of and possible treatment of bothersome snoring.   7. Weight loss may be of benefit in reducing the severity of snoring.   Signature: Fransico Him, MD; [facility]; [place], [place] Board of Sleep Medicine Electronically Signed: 05/08/22

## 2022-05-08 ENCOUNTER — Ambulatory Visit (HOSPITAL_COMMUNITY): Payer: BC Managed Care – PPO | Admitting: Anesthesiology

## 2022-05-08 ENCOUNTER — Ambulatory Visit: Payer: BC Managed Care – PPO

## 2022-05-08 ENCOUNTER — Ambulatory Visit (HOSPITAL_COMMUNITY)
Admission: RE | Admit: 2022-05-08 | Discharge: 2022-05-08 | Disposition: A | Discharge status: Home / Self Care | Payer: BC Managed Care – PPO | Source: Ambulatory Visit | Attending: Internal Medicine | Admitting: Internal Medicine

## 2022-05-08 ENCOUNTER — Encounter (HOSPITAL_COMMUNITY)
Admission: RE | Disposition: A | Discharge status: Home / Self Care | Payer: Self-pay | Source: Ambulatory Visit | Attending: Internal Medicine

## 2022-05-08 ENCOUNTER — Encounter (HOSPITAL_COMMUNITY): Payer: Self-pay | Admitting: Internal Medicine

## 2022-05-08 DIAGNOSIS — K219 Gastro-esophageal reflux disease without esophagitis: Secondary | ICD-10-CM | POA: Diagnosis not present

## 2022-05-08 DIAGNOSIS — G4733 Obstructive sleep apnea (adult) (pediatric): Secondary | ICD-10-CM | POA: Insufficient documentation

## 2022-05-08 DIAGNOSIS — Z1211 Encounter for screening for malignant neoplasm of colon: Secondary | ICD-10-CM | POA: Diagnosis not present

## 2022-05-08 DIAGNOSIS — K5730 Diverticulosis of large intestine without perforation or abscess without bleeding: Secondary | ICD-10-CM | POA: Diagnosis not present

## 2022-05-08 DIAGNOSIS — I2510 Atherosclerotic heart disease of native coronary artery without angina pectoris: Secondary | ICD-10-CM | POA: Insufficient documentation

## 2022-05-08 DIAGNOSIS — Z86010 Personal history of colonic polyps: Secondary | ICD-10-CM | POA: Insufficient documentation

## 2022-05-08 DIAGNOSIS — Z79899 Other long term (current) drug therapy: Secondary | ICD-10-CM | POA: Insufficient documentation

## 2022-05-08 DIAGNOSIS — I10 Essential (primary) hypertension: Secondary | ICD-10-CM | POA: Diagnosis not present

## 2022-05-08 DIAGNOSIS — Z09 Encounter for follow-up examination after completed treatment for conditions other than malignant neoplasm: Secondary | ICD-10-CM | POA: Diagnosis not present

## 2022-05-08 HISTORY — PX: COLONOSCOPY WITH PROPOFOL: SHX5780

## 2022-05-08 LAB — GLUCOSE, CAPILLARY: Glucose-Capillary: 84 mg/dL (ref 70–99)

## 2022-05-08 SURGERY — COLONOSCOPY WITH PROPOFOL
Anesthesia: General

## 2022-05-08 MED ORDER — LACTATED RINGERS IV SOLN: INTRAVENOUS | Status: DC | PRN

## 2022-05-08 MED ORDER — PROPOFOL 10 MG/ML IV BOLUS
INTRAVENOUS | Status: DC | PRN
  Administered 2022-05-08: 100 mg via INTRAVENOUS

## 2022-05-08 MED ORDER — LIDOCAINE HCL (CARDIAC) PF 100 MG/5ML IV SOSY
PREFILLED_SYRINGE | INTRAVENOUS | Status: DC | PRN
  Administered 2022-05-08: 50 mg via INTRATRACHEAL

## 2022-05-08 MED ORDER — EPHEDRINE SULFATE-NACL 50-0.9 MG/10ML-% IV SOSY
PREFILLED_SYRINGE | INTRAVENOUS | Status: DC | PRN
  Administered 2022-05-08 (×2): 10 mg via INTRAVENOUS

## 2022-05-08 MED ORDER — PROPOFOL 500 MG/50ML IV EMUL
INTRAVENOUS | Status: DC | PRN
  Administered 2022-05-08: 200 ug/kg/min via INTRAVENOUS

## 2022-05-08 NOTE — Discharge Instructions (Addendum)
  Colonoscopy Discharge Instructions  Read the instructions outlined below and refer to this sheet in the next few weeks. These discharge instructions provide you with general information on caring for yourself after you leave the hospital. Your doctor may also give you specific instructions. While your treatment has been planned according to the most current medical practices available, unavoidable complications occasionally occur. If you have any problems or questions after discharge, call Dr. Gala Romney at [phone]. ACTIVITY You may resume your regular activity, but move at a slower pace for the next 24 hours.  Take frequent rest periods for the next 24 hours.  Walking will help get rid of the air and reduce the bloated feeling in your belly (abdomen).  No driving for 24 hours (because of the medicine (anesthesia) used during the test).   Do not sign any important legal documents or operate any machinery for 24 hours (because of the anesthesia used during the test).  NUTRITION Drink plenty of fluids.  You may resume your normal diet as instructed by your doctor.  Begin with a light meal and progress to your normal diet. Heavy or fried foods are harder to digest and may make you feel sick to your stomach (nauseated).  Avoid alcoholic beverages for 24 hours or as instructed.  MEDICATIONS You may resume your normal medications unless your doctor tells you otherwise.  WHAT YOU CAN EXPECT TODAY Some feelings of bloating in the abdomen.  Passage of more gas than usual.  Spotting of blood in your stool or on the toilet paper.  IF YOU HAD POLYPS REMOVED DURING THE COLONOSCOPY: No aspirin products for 7 days or as instructed.  No alcohol for 7 days or as instructed.  Eat a soft diet for the next 24 hours.  FINDING OUT THE RESULTS OF YOUR TEST Not all test results are available during your visit. If your test results are not back during the visit, make an appointment with your caregiver to find out the  results. Do not assume everything is normal if you have not heard from your caregiver or the medical facility. It is important for you to follow up on all of your test results.  SEEK IMMEDIATE MEDICAL ATTENTION IF: You have more than a spotting of blood in your stool.  Your belly is swollen (abdominal distention).  You are nauseated or vomiting.  You have a temperature over 101.  You have abdominal pain or discomfort that is severe or gets worse throughout the day.     diverticulosis information provided  No polyps today  return in 10 years for repeat colonoscopy   at patient request, called Georgeanne Nim at [phone] findings and recommendation

## 2022-05-08 NOTE — H&P (Signed)
$'@LOGO'Q$ @   Primary Care Physician:  [name], MD Primary Gastroenterologist:  Dr. Gala Romney  Pre-Procedure History & Physical: HPI:  Andrew Arnold is a 66 y.o. male here for  surveillance colonoscopy.  History of colonic adenoma (cecal) removed 2015.  Left-sided diverticulosis also found at that time.  He is here for surveillance examination.  Past Medical History:  Diagnosis Date   Allergic rhinitis due to pollen    on allergy shots   Arrhythmia    1980s hospitalized for HTN and ?irregular rhythm but no specific dx but resolved during hospitalization   Diabetes mellitus without complication ([place])    HTN (hypertension)    Hyperlipidemia    OSA (obstructive sleep apnea)    mild obstructive sleep apnea with an AHI of 10.8/h with oxygen saturations as low as 85%.    Past Surgical History:  Procedure Laterality Date   ANTERIOR CERVICAL DECOMP/DISCECTOMY FUSION N/A 06/03/2021   Procedure: Anterior Cervical Decompression Fusion  Cervical three-four;  Surgeon: Vallarie Mare, MD;  Location: [facility];  Service: Neurosurgery;  Laterality: N/A;   colonoscopy  2005   Dr. Gala Romney: normal rectum, sigmoid diverticula in colonic mucosa   COLONOSCOPY N/A 02/15/2014   Procedure: COLONOSCOPY;  Surgeon: Daneil Dolin, MD;  [facility];  Service: Endoscopy;  Laterality: N/A;  2:00   RIGHT/LEFT HEART CATH AND CORONARY ANGIOGRAPHY N/A 05/23/2021   Procedure: RIGHT/LEFT HEART CATH AND CORONARY ANGIOGRAPHY;  Surgeon: Jolaine Artist, MD;  Location: [facility];  Service: Cardiovascular;  Laterality: N/A;    Prior to Admission medications   Medication Sig Start Date End Date Taking? Authorizing Provider  amLODipine (NORVASC) 10 MG tablet Take 1 tablet (10 mg total) by mouth daily. 08/06/21  Yes Thurnell Lose, MD  ascorbic acid (VITAMIN C) 500 MG tablet Take 500 mg by mouth every morning.    Yes [provider]  aspirin 81 MG chewable tablet Chew 1 tablet (81 mg  total) by mouth daily. 08/06/21  Yes Thurnell Lose, MD  atorvastatin (LIPITOR) 80 MG tablet Take 80 mg by mouth at bedtime.   Yes [provider]  B Complex Vitamins (B COMPLEX 100 PO) Take 1 tablet by mouth every morning.    Yes [provider]  carvedilol (COREG) 6.25 MG tablet Take 1 tablet (6.25 mg total) by mouth 2 (two) times daily with a meal. 08/05/21  Yes Thurnell Lose, MD  cetirizine (ZYRTEC) 10 MG tablet Take 10 mg by mouth in the morning.   Yes [provider]  cyanocobalamin (VITAMIN B12) 1000 MCG tablet Take 2,000 mcg by mouth daily.   Yes [provider]  metFORMIN (GLUCOPHAGE) 1000 MG tablet Take 1,000 mg by mouth 2 (two) times daily. 03/21/21  Yes [provider]  pantoprazole (PROTONIX) 40 MG tablet Take 40 mg by mouth daily.   Yes [provider]  potassium chloride SA (KLOR-CON M20) 20 MEQ tablet Take 1 tablet (20 mEq total) by mouth daily. 08/14/21 05/08/22 Yes [place], Maricela Bo, FNP  sacubitril-valsartan (ENTRESTO) 24-26 MG Take 1 tablet by mouth 2 (two) times daily. 08/13/21  Yes Bensimhon, Shaune Pascal, MD  spironolactone (ALDACTONE) 25 MG tablet Take 0.5 tablets (12.5 mg total) by mouth daily. 11/07/21  Yes Bensimhon, Shaune Pascal, MD  tamsulosin (FLOMAX) 0.4 MG CAPS capsule Take 1 capsule (0.4 mg total) by mouth daily. 11/13/21  Yes Irine Seal, MD  dapagliflozin propanediol (FARXIGA) 10 MG TABS tablet Take 1 tablet (10  mg total) by mouth daily before breakfast. 09/24/21   Rafael Bihari, FNP    Allergies as of 03/24/2022 - Review Complete 03/24/2022  Allergen Reaction Noted   Penicillins  09/17/2021    Family History  Problem Relation Age of Onset   Cancer Mother    Cirrhosis Father        cirrhosis of the liver; alcohol related   Diabetes Sister    Other Other        neice was on allergy vaccine when young   Colon cancer Neg Hx     Social History   Socioeconomic History   Marital status: Single    Spouse  name: Not on file   Number of children: Not on file   Years of education: Not on file   Highest education level: Not on file  Occupational History   Occupation: school counselor  Tobacco Use   Smoking status: Never   Smokeless tobacco: Never  Vaping Use   Vaping Use: Never used  Substance and Sexual Activity   Alcohol use: No   Drug use: No   Sexual activity: Yes  Other Topics Concern   Not on file  Social History [place], [place]   Lives in [place] Determinants of Health   Financial Resource Strain: Not on file  Food Insecurity: Not on file  Transportation Needs: Not on file  Physical Activity: Not on file  Stress: Not on file  Social Connections: Not on file  Intimate Partner Violence: Not on file    Review of Systems: See HPI, otherwise negative ROS  Physical Exam: BP 136/79   Pulse 64   Temp 97.8 F (36.6 C) (Oral)   Resp 16   Ht '5\' 9"'$  (1.753 m)   Wt 103.8 kg   SpO2 99%   BMI 33.79 kg/m  General:   Alert,  Well-developed, well-nourished, pleasant and cooperative in NAD Neck:  Supple; no masses or thyromegaly. No significant cervical adenopathy. Lungs:  Clear throughout to auscultation.   No wheezes, crackles, or rhonchi. No acute distress. Heart:  Regular rate and rhythm; no murmurs, clicks, rubs,  or gallops. Abdomen: Non-distended, normal bowel sounds.  Soft and nontender without appreciable mass or hepatosplenomegaly.  Pulses:  Normal pulses noted. Extremities:  Without clubbing or edema.  Impression/Plan:    66 year old gentleman with a history colonic adenoma; here for surveillance colonoscopy per plan.  I have offered him a surveillance colonoscopy today. The risks, benefits, limitations, alternatives and imponderables have been reviewed with the patient. Questions have been answered. All parties are agreeable.       Notice: This dictation was prepared with Dragon dictation along with smaller  phrase technology. Any transcriptional errors that result from this process are unintentional and may not be corrected upon review.

## 2022-05-08 NOTE — Transfer of Care (Signed)
Immediate Anesthesia Transfer of Care Note  Patient: Andrew Arnold  Procedure(s) Performed: COLONOSCOPY WITH PROPOFOL  Patient Location: Short Stay  Anesthesia Type:General  Level of Consciousness: awake, alert , oriented and patient cooperative  Airway & Oxygen Therapy: Patient Spontanous Breathing  Post-op Assessment: Report given to RN, Post -op Vital signs reviewed and stable and Patient moving all extremities  Post vital signs: Reviewed and stable  Last Vitals:  Vitals Value Taken Time  BP    Temp    Pulse    Resp    SpO2      Last Pain:  Vitals:   05/08/22 0949  TempSrc: Oral  PainSc: 0-No pain      Patients Stated Pain Goal: 7 (34/75/83 0746)  Complications: No notable events documented.

## 2022-05-08 NOTE — Anesthesia Postprocedure Evaluation (Signed)
Anesthesia Post Note  Patient: Andrew Arnold  Procedure(s) Performed: COLONOSCOPY WITH PROPOFOL  Patient location during evaluation: Phase II Anesthesia Type: General Level of consciousness: awake Pain management: pain level controlled Vital Signs Assessment: post-procedure vital signs reviewed and stable Respiratory status: spontaneous breathing and respiratory function stable Cardiovascular status: blood pressure returned to baseline and stable Postop Assessment: no headache and no apparent nausea or vomiting Anesthetic complications: no Comments: Late entry   No notable events documented.   Last Vitals:  Vitals:   05/08/22 1213 05/08/22 1217  BP: (!) 96/43 113/65  Pulse: 66   Resp: 15   Temp: 36.5 C   SpO2: 98%     Last Pain:  Vitals:   05/08/22 1213  TempSrc: Oral  PainSc: 0-No pain                 Louann Sjogren

## 2022-05-08 NOTE — Op Note (Signed)
[facility] Patient Name: Andrew Arnold Procedure Date: 05/08/2022 11:32 AM MRN: [id number] Date of Birth: 1956/06/28 Attending MD: Norvel Richards , MD, [id number] CSN: [id number] Age: 66 Admit Type: Outpatient Procedure:                Colonoscopy Indications:              High risk colon cancer surveillance: Personal                            history of colonic polyps Providers:                Norvel Richards, MD, Lurline Del, RN, Randa Spike, Technician Referring MD:              Medicines:                Propofol per Anesthesia Complications:            No immediate complications. Estimated Blood Loss:     Estimated blood loss: none. Procedure:                Pre-Anesthesia Assessment:                           - Prior to the procedure, a History and Physical                            was performed, and patient medications and                            allergies were reviewed. The patient's tolerance of                            previous anesthesia was also reviewed. The risks                            and benefits of the procedure and the sedation                            options and risks were discussed with the patient.                            All questions were answered, and informed consent                            was obtained. Prior Anticoagulants: The patient has                            taken no anticoagulant or antiplatelet agents. ASA                            Grade Assessment: III - A patient with severe  systemic disease. After reviewing the risks and                            benefits, the patient was deemed in satisfactory                            condition to undergo the procedure.                           After obtaining informed consent, the colonoscope                            was passed under direct vision. Throughout the                            procedure, the  patient's blood pressure, pulse, and                            oxygen saturations were monitored continuously. The                            [id number] scope was introduced through the                            anus and advanced to the the cecum, identified by                            appendiceal orifice and ileocecal valve. The                            colonoscopy was performed without difficulty. The                            patient tolerated the procedure well. The quality                            of the bowel preparation was adequate. The                            ileocecal valve, appendiceal orifice, and rectum                            were photographed. Scope In: 11:50:16 AM Scope Out: 12:06:28 PM Scope Withdrawal Time: 0 hours 6 minutes 36 seconds  Total Procedure Duration: 0 hours 16 minutes 12 seconds  Findings:      The perianal and digital rectal examinations were normal.      Scattered medium-mouthed diverticula were found in the entire colon.      The exam was otherwise without abnormality on direct and retroflexion       views. Impression:               - Diverticulosis in the entire examined colon.                           - The  examination was otherwise normal on direct                            and retroflexion views.                           - No specimens collected. Moderate Sedation:      Moderate (conscious) sedation was personally administered by an       anesthesia professional. The following parameters were monitored: oxygen       saturation, heart rate, blood pressure, and response to care. Recommendation:           - Patient has a contact number available for                            emergencies. The signs and symptoms of potential                            delayed complications were discussed with the                            patient. Return to normal activities tomorrow.                            Written discharge instructions  were provided to the                            patient.                           - Advance diet as tolerated.                           - Continue present medications.                           - Repeat colonoscopy in 10 years for screening                            purposes.                           - Return to GI office after studies are complete. Procedure Code(s):        --- Professional ---                           [phone], Colonoscopy, flexible; diagnostic, including                            collection of specimen(s) by brushing or washing,                            when performed (separate procedure) Diagnosis Code(s):        --- Professional ---                           Z86.010, Personal history of  colonic polyps                           K57.30, Diverticulosis of large intestine without                            perforation or abscess without bleeding CPT copyright 2022 American Medical Association. All rights reserved. The codes documented in this report are preliminary and upon coder review may  be revised to meet current compliance requirements. Cristopher Estimable. Rielly Brunn, MD Norvel Richards, MD 05/08/2022 12:13:40 PM This report has been signed electronically. Number of Addenda: 0

## 2022-05-08 NOTE — Anesthesia Preprocedure Evaluation (Signed)
Anesthesia Evaluation  Patient identified by MRN, date of birth, ID band Patient awake    Reviewed: Allergy & Precautions, H&P , NPO status , Patient's Chart, lab work & pertinent test results, reviewed documented beta blocker date and time   Airway Mallampati: II  TM Distance: >3 FB Neck ROM: full    Dental no notable dental hx.    Pulmonary sleep apnea ,    Pulmonary exam normal breath sounds clear to auscultation       Cardiovascular Exercise Tolerance: Good hypertension, + CAD   Rhythm:regular Rate:Normal     Neuro/Psych negative neurological ROS  negative psych ROS   GI/Hepatic Neg liver ROS, GERD  Medicated,  Endo/Other  negative endocrine ROSdiabetes, Type 2  Renal/GU negative Renal ROS  negative genitourinary   Musculoskeletal   Abdominal   Peds  Hematology  (+) Blood dyscrasia, anemia ,   Anesthesia Other Findings   Reproductive/Obstetrics negative OB ROS                             Anesthesia Physical Anesthesia Plan  ASA: 3  Anesthesia Plan: General   Post-op Pain Management:    Induction:   PONV Risk Score and Plan: Propofol infusion  Airway Management Planned:   Additional Equipment:   Intra-op Plan:   Post-operative Plan:   Informed Consent: I have reviewed the patients History and Physical, chart, labs and discussed the procedure including the risks, benefits and alternatives for the proposed anesthesia with the patient or authorized representative who has indicated his/her understanding and acceptance.     Dental Advisory Given  Plan Discussed with: CRNA  Anesthesia Plan Comments:         Anesthesia Quick Evaluation

## 2022-05-11 ENCOUNTER — Other Ambulatory Visit: Payer: BC Managed Care – PPO

## 2022-05-14 ENCOUNTER — Other Ambulatory Visit: Payer: BC Managed Care – PPO

## 2022-05-14 ENCOUNTER — Other Ambulatory Visit: Payer: Self-pay

## 2022-05-14 DIAGNOSIS — R972 Elevated prostate specific antigen [PSA]: Secondary | ICD-10-CM

## 2022-05-15 LAB — PSA, TOTAL AND FREE
PSA, Free Pct: 14 %
PSA, Free: 0.88 ng/mL
Prostate Specific Ag, Serum: 6.3 ng/mL — ABNORMAL HIGH (ref 0.0–4.0)

## 2022-05-21 ENCOUNTER — Ambulatory Visit (INDEPENDENT_AMBULATORY_CARE_PROVIDER_SITE_OTHER): Payer: BC Managed Care – PPO | Admitting: Urology

## 2022-05-21 ENCOUNTER — Encounter: Payer: Self-pay | Admitting: Urology

## 2022-05-21 VITALS — BP 130/78 | HR 76

## 2022-05-21 DIAGNOSIS — R972 Elevated prostate specific antigen [PSA]: Secondary | ICD-10-CM | POA: Diagnosis not present

## 2022-05-21 DIAGNOSIS — N138 Other obstructive and reflux uropathy: Secondary | ICD-10-CM

## 2022-05-21 DIAGNOSIS — N3281 Overactive bladder: Secondary | ICD-10-CM

## 2022-05-21 DIAGNOSIS — R351 Nocturia: Secondary | ICD-10-CM

## 2022-05-21 DIAGNOSIS — N403 Nodular prostate with lower urinary tract symptoms: Secondary | ICD-10-CM

## 2022-05-21 DIAGNOSIS — N401 Enlarged prostate with lower urinary tract symptoms: Secondary | ICD-10-CM

## 2022-05-21 NOTE — Progress Notes (Signed)
[employer] [id number] Item# [id number] Kit ID# [id number]

## 2022-05-21 NOTE — Progress Notes (Signed)
Subjective: 1. Elevated PSA   2. Nodular prostate with lower urinary tract symptoms   3. Nocturia      04/10/21: Andrew Arnold is a 66 yo male who is sent for evaluation of voiding symptoms.   He has about a 3 month history  frequency q1-2hrs but his main compaint is nocturia 3+ x nightly.  He has some urgency and with incontinence and is wearing depends.  He has a good stream and feels he empties.   He has no dysuria and no hematuria.  He has no other GU surgery.  His symptoms are not impacted by the HCTZ.  He has not been treated for his symptoms.  He had a PSA that was 3 in 6/20.   He has some RUE numbness and RLE weakness for 3 months and is seeing neurology for that.  He has some dizziness in the AM.  He had a normal MRI of the head on 04/01/21.   He had some lumbar DDD on a recent spine series.  His UA has 0-2 RBC's and his PVR is 19m.    05/01/21: Andrew Arnold today in f/u.   He was given tamsulosin for his LUTS.  He still has nocturia x 3 and reports frequency every 2 hours and he has urgency.  He wears depends but is generally able to get to the bathroom before leaking.   His IPSS today is only 3.   His PVR was 420m   He had  Cervical MRI on 10/17 that shows cervical spinal stenosis and DDD.   He had a repeat PSA on 04/10/21 and it was elevated at 5.5 with a 15.5 % f/t ratio.    08/07/21: Andrew Arnold today in f/u.   He was in the ER on 07/30/21 and treated for pneumonia but was then hospitalized with CP on 08/01/21.  He was discharge with with keflex on 08/05/21.  He remains on tamsulosin.  He is voiding better with an IPSS of 2 from nocturia.  He was supposed to have a prostate biopsy but has had some surgery for a cervical disc on 06/03/21.  He was admitted for CHF prior to that in early November after a syncopal episode.  He had a CT AP on 08/02/21 and his prostate is about 11648mith a middle lobe.   11/13/21: Andrew Arnold today in f/u for his history of BPH with BOO and an elevated PSA.  He  has a 116m65mostate with a middle lobe.  His PSA on 11/05/21 was 5.6 with an 18.8% f/t ratio.  It was 5.5 on 04/10/21.   He had previously been scheduled for a biopsy but had  a Cervical disc surgery that interrupted the plan.  He had issues with incontinence but that had resolved with tamsulosin.  He has some nocturia but that can be associated with his evening fluid intake.  His IPSS is 5.  With nocturia x 3.   He was in the ED on 11/08/21 with chest pain and he was sent home with the presumptive diagnosis of heartburn.  HIs UA is clear.   05/21/22: Andrew Arnold today in f/u for the history above.  His PSA is up further to 6.3 with a 14% f/t ratio.  He is voiding well with nocturia x 3.  His IPSS is 6.  He has had no hematuria or dysuria.        ROS:  Review of Systems  Constitutional: Negative.   Respiratory:  Negative for shortness  of breath.   Cardiovascular:  Negative for chest pain and leg swelling.  Gastrointestinal: Negative.   Musculoskeletal: Negative.   All other systems reviewed and are negative.   Allergies  Allergen Reactions   Penicillins     Pt unsure of reaction     Past Medical History:  Diagnosis Date   Allergic rhinitis due to pollen    on allergy shots   Arrhythmia    1980s hospitalized for HTN and ?irregular rhythm but no specific dx but resolved during hospitalization   Diabetes mellitus without complication ([place])    HTN (hypertension)    Hyperlipidemia    OSA (obstructive sleep apnea)    mild obstructive sleep apnea with an AHI of 10.8/h with oxygen saturations as low as 85%.    Past Surgical History:  Procedure Laterality Date   ANTERIOR CERVICAL DECOMP/DISCECTOMY FUSION N/A 06/03/2021   Procedure: Anterior Cervical Decompression Fusion  Cervical three-four;  Surgeon: Vallarie Mare, MD;  Location: [facility];  Service: Neurosurgery;  Laterality: N/A;   colonoscopy  2005   Dr. Gala Romney: normal rectum, sigmoid diverticula in colonic mucosa    COLONOSCOPY N/A 02/15/2014   Procedure: COLONOSCOPY;  Surgeon: Daneil Dolin, MD;  [facility];  Service: Endoscopy;  Laterality: N/A;  2:00   RIGHT/LEFT HEART CATH AND CORONARY ANGIOGRAPHY N/A 05/23/2021   Procedure: RIGHT/LEFT HEART CATH AND CORONARY ANGIOGRAPHY;  Surgeon: Jolaine Artist, MD;  Location: [facility];  Service: Cardiovascular;  Laterality: N/A;    Social History   Socioeconomic History   Marital status: Single    Spouse name: Not on file   Number of children: Not on file   Years of education: Not on file   Highest education level: Not on file  Occupational History   Occupation: school counselor  Tobacco Use   Smoking status: Never   Smokeless tobacco: Never  Vaping Use   Vaping Use: Never used  Substance and Sexual Activity   Alcohol use: No   Drug use: No   Sexual activity: Yes  Other Topics Concern   Not on file  Social History Narrative   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[place], [place]   Lives in [place] Determinants of Health   Financial Resource Strain: Not on file  Food Insecurity: Not on file  Transportation Needs: Not on file  Physical Activity: Not on file  Stress: Not on file  Social Connections: Not on file  Intimate Partner Violence: Not on file    Family History  Problem Relation Age of Onset   Cancer Mother    Cirrhosis Father        cirrhosis of the liver; alcohol related   Diabetes Sister    Other Other        neice was on allergy vaccine when young   Colon cancer Neg Hx     Anti-infectives: Anti-infectives (From admission, onward)    None       Current Outpatient Medications  Medication Sig Dispense Refill   amLODipine (NORVASC) 10 MG tablet Take 1 tablet (10 mg total) by mouth daily. 30 tablet 0   ascorbic acid (VITAMIN C) 500 MG tablet Take 500 mg by mouth every morning.      aspirin 81 MG chewable tablet Chew 1 tablet (81 mg total) by mouth daily. 30 tablet 0   atorvastatin (LIPITOR)  80 MG tablet Take 80 mg by mouth at bedtime.  B Complex Vitamins (B COMPLEX 100 PO) Take 1 tablet by mouth every morning.      carvedilol (COREG) 6.25 MG tablet Take 1 tablet (6.25 mg total) by mouth 2 (two) times daily with a meal. 60 tablet 0   cetirizine (ZYRTEC) 10 MG tablet Take 10 mg by mouth in the morning.     cyanocobalamin (VITAMIN B12) 1000 MCG tablet Take 2,000 mcg by mouth daily.     dapagliflozin propanediol (FARXIGA) 10 MG TABS tablet Take 1 tablet (10 mg total) by mouth daily before breakfast. 90 tablet 3   metFORMIN (GLUCOPHAGE) 1000 MG tablet Take 1,000 mg by mouth 2 (two) times daily.     pantoprazole (PROTONIX) 40 MG tablet Take 40 mg by mouth daily.     sacubitril-valsartan (ENTRESTO) 24-26 MG Take 1 tablet by mouth 2 (two) times daily. 180 tablet 3   spironolactone (ALDACTONE) 25 MG tablet Take 0.5 tablets (12.5 mg total) by mouth daily. 15 tablet 6   tamsulosin (FLOMAX) 0.4 MG CAPS capsule Take 1 capsule (0.4 mg total) by mouth daily. 90 capsule 3   potassium chloride SA (KLOR-CON M20) 20 MEQ tablet Take 1 tablet (20 mEq total) by mouth daily. 90 tablet 3   No current facility-administered medications for this visit.     Objective: Vital signs in last 24 hours: BP 130/78   Pulse 76   Intake/Output from previous day: No intake/output data recorded. Intake/Output this shift: '@IOTHISSHIFT'$ @   Physical Exam Vitals reviewed.  Constitutional:      Appearance: Normal appearance.  Genitourinary:    Comments: AP/NST without mass or lesions. Prostate 3+ with right apical induration. SV non-palpable.  Neurological:     Mental Status: He is alert.     Lab Results:  Recent Results (from the past 2160 hour(s))  Basic metabolic panel     Status: None   Collection Time: 05/06/22 11:30 AM  Result Value Ref Range   Sodium 140 135 - 145 mmol/L   Potassium 4.5 3.5 - 5.1 mmol/L   Chloride 105 98 - 111 mmol/L   CO2 28 22 - 32 mmol/L   Glucose, Bld 99 70 - 99 mg/dL     Comment: Glucose reference range applies only to samples taken after fasting for at least 8 hours.   BUN 21 8 - 23 mg/dL   Creatinine, Ser 1.08 0.61 - 1.24 mg/dL   Calcium 9.4 8.9 - 10.3 mg/dL   GFR, Estimated >60 >60 mL/min    Comment: (NOTE) Calculated using the CKD-EPI Creatinine Equation (2021)    Anion gap 7 5 - 15    Comment: Performed at [facility], [address] [place], [place] [address]  Glucose, capillary     Status: None   Collection Time: 05/08/22  9:39 AM  Result Value Ref Range   Glucose-Capillary 84 70 - 99 mg/dL    Comment: Glucose reference range applies only to samples taken after fasting for at least 8 hours.  PSA, total and free     Status: Abnormal   Collection Time: 05/14/22  3:45 PM  Result Value Ref Range   Prostate Specific Ag, Serum 6.3 (H) 0.0 - 4.0 ng/mL    Comment: Roche ECLIA methodology. According to the American Urological Association, Serum PSA should decrease and remain at undetectable levels after radical prostatectomy. The AUA defines biochemical recurrence as an initial PSA value 0.2 ng/mL or greater followed by a subsequent confirmatory PSA value 0.2 ng/mL or greater. Values obtained with different  assay methods or kits cannot be used interchangeably. Results cannot be interpreted as absolute evidence of the presence or absence of malignant disease.    PSA, Free 0.88 N/A ng/mL    Comment: Roche ECLIA methodology.   PSA, Free Pct 14.0 %    Comment: The table below lists the probability of prostate cancer for men with non-suspicious DRE results and total PSA between 4 and 10 ng/mL, by patient age Ricci Barker, [place], 032:1224).                   % Free PSA       50-64 yr        65-75 yr                   0.00-10.00%        56%             55%                  10.01-15.00%        24%             35%                  15.01-20.00%        17%             23%                  20.01-25.00%        10%             20%                        >25.00%         5%              9% Please note:  Catalona et al did not make specific               recommendations regarding the use of               percent free PSA for any other population               of men.       BMET No results for input(s): "NA", "K", "CL", "CO2", "GLUCOSE", "BUN", "CREATININE", "CALCIUM" in the last 72 hours.  PT/INR No results for input(s): "LABPROT", "INR" in the last 72 hours. ABG No results for input(s): "PHART", "HCO3" in the last 72 hours.  Invalid input(s): "PCO2", "PO2"  Studies/Results: No results found. PVR 60m  No results found.   Assessment/Plan: BPH with BOO and OAB wet.  He is doing well on the tamsulosin with near resolution of his symptoms and he will continue that.  Med refilled.  Elevated PSA.   His PSA is up further.   He has some right apical induration as well.  I am going to get an MRIP and then set him up for either a standard biopsy or fusion biopsy based on the MR results.  I reviewed the risks of bleeding, infection and voiding difficulty No orders of the defined types were placed in this encounter.      Orders Placed This Encounter  Procedures   MR PROSTATE W WO CONTRAST    Standing Status:   Future    Standing Expiration Date:   05/22/2023    Order Specific Question:   If indicated for the  ordered procedure, I authorize the administration of contrast media per Radiology protocol    Answer:   Yes    Order Specific Question:   What is the patient's sedation requirement?    Answer:   No Sedation    Order Specific Question:   Does the patient have a pacemaker or implanted devices?    Answer:   No    Order Specific Question:   Radiology Contrast Protocol - do NOT remove file path    Answer:   [email or web address]    Order Specific Question:   Preferred imaging location?    Answer:   [facility] (table limit-550lbs)   Urinalysis, Routine w reflex microscopic      Return for I will call with MRI results and arrange follow up accordingly. .    CC: Dr. [name].      Irine Seal 05/22/2022 [phone] Patient ID: [name], male   DOB: 12/15/1955, 66 y.o.   MRN: [id number] Patient ID: BARRET ESQUIVEL, male   DOB: 11/03/1955, 66 y.o.   MRN: [id number]

## 2022-05-22 ENCOUNTER — Ambulatory Visit: Payer: BC Managed Care – PPO | Admitting: Neurology

## 2022-05-22 ENCOUNTER — Ambulatory Visit (INDEPENDENT_AMBULATORY_CARE_PROVIDER_SITE_OTHER): Payer: BC Managed Care – PPO | Admitting: Neurology

## 2022-05-22 VITALS — BP 158/75 | HR 79

## 2022-05-22 DIAGNOSIS — R269 Unspecified abnormalities of gait and mobility: Secondary | ICD-10-CM

## 2022-05-22 DIAGNOSIS — R202 Paresthesia of skin: Secondary | ICD-10-CM

## 2022-05-22 DIAGNOSIS — G8929 Other chronic pain: Secondary | ICD-10-CM

## 2022-05-22 DIAGNOSIS — M545 Low back pain, unspecified: Secondary | ICD-10-CM

## 2022-05-22 LAB — MICROSCOPIC EXAMINATION
Bacteria, UA: NONE SEEN
Epithelial Cells (non renal): NONE SEEN /hpf (ref 0–10)

## 2022-05-22 LAB — URINALYSIS, ROUTINE W REFLEX MICROSCOPIC
Bilirubin, UA: NEGATIVE
Leukocytes,UA: NEGATIVE
Nitrite, UA: NEGATIVE
RBC, UA: NEGATIVE
Specific Gravity, UA: 1.025 (ref 1.005–1.030)
Urobilinogen, Ur: 1 mg/dL (ref 0.2–1.0)
pH, UA: 5 (ref 5.0–7.5)

## 2022-05-22 NOTE — Progress Notes (Signed)
No chief complaint on file.     ASSESSMENT AND PLAN  Andrew Arnold is a 66 y.o. 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   MEDICAL HISTORY:  Andrew Arnold, is a 66 year old male seen in request by PICC surgeon Justice Britain for evaluation of right-sided weakness, his primary care physician is Dr. [name], initial evaluation was on March 10, 2022  I reviewed and summarized the referring note. PMHX. HTN HLD DM-since 2022,  GERD. 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 Claudette Head on June 03, 2021  He reported recovering well, he can move his right arm and walking better, he lives alone, Hospital admission in January 2023 for generalized weakness, pneumonia versus gastroenteritis, weight loss, severely dehydrated, orthostatic  hypotension,  Ultrasound of carotid artery showed no significant large vessel disease  ER presentation April 23 chest pain, CT chest showed patchy groundglass opacity in the right lower lobe right middle lobe likely infection  He complains of right shoulder pain, was seen by orthopedic surgeon Dr. Onnie Graham, MRI of right shoulder from a note also January 30, 2022, severe supraspinatus and infraspinatus tendinosis, 2.0 x 1.9 cm high-grade partial articular surface tear is evident, just medial to the infraspinatus insertion,   During my examination, he was noted to have right arm and leg weakness, before proceeding to further orthopedic intervention, is referred to neurology for evaluation  Patient is a poor historian, complains of gait abnormality, right hand weakness, paresthesia, denies significant neck pain, does have low back pain, more to the left side, also noticed left lateral leg paresthesia, left toe numbness   PHYSICAL EXAM:   There were no vitals filed for this visit.  Not recorded     There is no height or weight on file to calculate BMI.  PHYSICAL EXAMNIATION:  Gen: NAD, conversant, well nourised, well groomed                     Cardiovascular: Regular rate rhythm, no peripheral edema, warm, nontender. Eyes: Conjunctivae clear without exudates or hemorrhage Neck: Supple, no carotid bruits. Pulmonary: Clear to auscultation bilaterally   NEUROLOGICAL EXAM:  MENTAL STATUS: Speech/cognition: Awake, alert, oriented to history taking and casual conversation CRANIAL NERVES: CN II: Visual fields are full to confrontation. Pupils are round equal and briskly reactive to light. CN III, IV, VI: extraocular movement are normal. No ptosis. CN V: Facial sensation is intact to light touch CN VII: Face is symmetric with normal eye closure  CN  VIII: Hearing is normal to causal conversation. CN IX, X: Phonation is normal. CN XI: Head turning and shoulder shrug are intact CN XII:  Narrow oropharyngeal space  MOTOR: Moderate right shoulder abduction, external rotation, elbow flexion, extension weakness, no significant right upper extremity distal muscle weakness  Moderate right hip flexion, ankle dorsiflexion weakness, mild right knee flexion extension weakness,  REFLEXES: Reflexes are 2+ and symmetric at the biceps, triceps, knees, and absent ankles. Plantar responses are flexor.  SENSORY: Intact to light touch, pinprick and vibratory sensation are intact in fingers and toes.  COORDINATION: There is no trunk or limb dysmetria noted.  GAIT/STANCE: He needs push-up to get up from seated position, dragging right leg tendency to hyperextend right knee  REVIEW OF SYSTEMS:  Full 14 system review of systems performed and notable only for as above All other review of systems were negative.   ALLERGIES: Allergies  Allergen Reactions   Penicillins     Pt unsure of reaction     HOME MEDICATIONS: Current Outpatient Medications  Medication Sig Dispense Refill   amLODipine (NORVASC) 10 MG tablet Take 1 tablet (10 mg total) by mouth daily. 30 tablet 0   ascorbic acid (VITAMIN C) 500 MG tablet Take 500 mg by mouth every morning.      aspirin 81 MG chewable tablet Chew 1 tablet (81 mg total) by mouth daily. 30 tablet 0   atorvastatin (LIPITOR) 80 MG tablet Take 80 mg by mouth at bedtime.     B Complex Vitamins (B COMPLEX 100 PO) Take 1 tablet by mouth every morning.      carvedilol (COREG) 6.25 MG tablet Take 1 tablet (6.25 mg total) by mouth 2 (two) times daily with a meal. 60 tablet 0   cetirizine (ZYRTEC) 10 MG tablet Take 10 mg by mouth in the morning.     cyanocobalamin (VITAMIN B12) 1000 MCG tablet Take 2,000 mcg by mouth daily.     dapagliflozin propanediol (FARXIGA) 10 MG TABS tablet Take 1 tablet (10 mg total) by mouth daily before breakfast. 90 tablet 3   metFORMIN (GLUCOPHAGE) 1000 MG tablet Take 1,000 mg by mouth 2 (two) times daily.     pantoprazole  (PROTONIX) 40 MG tablet Take 40 mg by mouth daily.     potassium chloride SA (KLOR-CON M20) 20 MEQ tablet Take 1 tablet (20 mEq total) by mouth daily. 90 tablet 3   sacubitril-valsartan (ENTRESTO) 24-26 MG Take 1 tablet by mouth 2 (two) times daily. 180 tablet 3   spironolactone (ALDACTONE) 25 MG tablet Take 0.5 tablets (12.5 mg total) by mouth daily. 15 tablet 6   tamsulosin (FLOMAX) 0.4 MG CAPS capsule Take 1 capsule (0.4 mg total) by mouth daily. 90 capsule 3   No current facility-administered medications for this visit.    PAST MEDICAL HISTORY: Past Medical History:  Diagnosis Date   Allergic rhinitis due to pollen    on allergy shots   Arrhythmia    1980s hospitalized for HTN and ?irregular rhythm but no specific dx but resolved during hospitalization   Diabetes mellitus without complication ([place])    HTN (hypertension)    Hyperlipidemia    OSA (obstructive sleep apnea)    mild obstructive sleep apnea with an AHI of 10.8/h with oxygen saturations as low as 85%.    PAST SURGICAL HISTORY: Past Surgical History:  Procedure Laterality Date   ANTERIOR CERVICAL DECOMP/DISCECTOMY FUSION N/A 06/03/2021   Procedure: Anterior Cervical Decompression Fusion  Cervical three-four;  Surgeon: Marcello Moores,  Dorcas Carrow, MD;  Location: [facility];  Service: Neurosurgery;  Laterality: N/A;   colonoscopy  2005   Dr. Gala Romney: normal rectum, sigmoid diverticula in colonic mucosa   COLONOSCOPY N/A 02/15/2014   Procedure: COLONOSCOPY;  Surgeon: Daneil Dolin, MD;  [facility];  Service: Endoscopy;  Laterality: N/A;  2:00   RIGHT/LEFT HEART CATH AND CORONARY ANGIOGRAPHY N/A 05/23/2021   Procedure: RIGHT/LEFT HEART CATH AND CORONARY ANGIOGRAPHY;  Surgeon: Jolaine Artist, MD;  Location: [facility];  Service: Cardiovascular;  Laterality: N/A;    FAMILY HISTORY: Family History  Problem Relation Age of Onset   Cancer Mother    Cirrhosis Father        cirrhosis of the liver; alcohol related    Diabetes Sister    Other Other        neice was on allergy vaccine when young   Colon cancer Neg Hx     SOCIAL HISTORY: Social History   Socioeconomic History   Marital status: Single    Spouse name: Not on file   Number of children: Not on file   Years of education: Not on file   Highest education level: Not on file  Occupational History   Occupation: school counselor  Tobacco Use   Smoking status: Never   Smokeless tobacco: Never  Vaping Use   Vaping Use: Never used  Substance and Sexual Activity   Alcohol use: No   Drug use: No   Sexual activity: Yes  Other Topics Concern   Not on file  Social History [place], [place]   Lives in [place] Determinants of Health   Financial Resource Strain: Not on file  Food Insecurity: Not on file  Transportation Needs: Not on file  Physical Activity: Not on file  Stress: Not on file  Social Connections: Not on file  Intimate Partner Violence: Not on file      Marcial Pacas, M.D. Ph.D.  [facility] [address] [place], [place] [address] Ph: [phone] Fax: [phone]  CC:  [name], MD [place],  [place] [id number]  [name], MD    Total time spent reviewing the chart, obtaining history, examined patient, ordering tests, documentation, consultations and family, care coordination was 65 minutes

## 2022-05-24 ENCOUNTER — Telehealth: Payer: Self-pay | Admitting: Neurology

## 2022-05-24 NOTE — Telephone Encounter (Addendum)
I have ordered MRI of the lumbar spine on March 10, 2022, but he has not heard anything about it, please follow-up on that  Give him a follow up with NP in 6 months

## 2022-05-24 NOTE — Procedures (Signed)
Full Name: Andrew Arnold Gender: Male MRN #: [id number] Date of Birth: 1956/04/06    Visit Date: 05/22/2022 11:13 Age: 66 Years Examining Physician: Marcial Pacas Referring Physician: Marcial Pacas Height: 5 feet 9 inch History: 66 year old male presenting with chronic low back pain, lower extremity paresthesia worse on the left side history of cervical decompression surgery for cervical myelopathy preferentially involving right hemicord at C3-4  Summary of the test: Nerve conduction study: Right sural, superficial peroneal sensory responses were absent.  Right median sensory response showed normal peak latency with moderately decreased snap amplitude, right ulnar sensory responses were normal.  Right tibial motor response showed significantly decreased CMAP amplitude,  Right peroneal to EDB motor responses were within normal limit.  Right ulnar motor responses showed no significant abnormality but it was a technically challenging study due to difficulty eliciting CMAP amplitude with distal stimulation side which is well preserved below elbow and above elbow CMAP  Right median motor responses also showed well-preserved CMAP at proximal stimulation site but difficulty eliciting CMAP response at distal stimulation site  Electromyography:  Selected needle examinations were performed at bilateral lower extremity muscles, lumbosacral paraspinal muscles, right upper extremity muscles and right cervical paraspinal muscles  There was no significant abnormality of bilateral lower extremity muscles, no spontaneous activity at bilateral lumbosacral paraspinal muscles  Mild chronic neuropathic changes at right C5-6 myotomes.  There is evidence of small polyphasic motor unit potentials that cervical paraspinal muscles   Conclusion: This is an abnormal study.  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  There is also evidence of chronic right C5-6  radiculopathy.    ------------------------------- Leanord Hawking, M.D. Ph.D.  [facility] [address], [place], [place] [address] Tel: [phone] Fax: [phone]  Verbal informed consent was obtained from the patient, patient was informed of potential risk of procedure, including bruising, bleeding, hematoma formation, infection, muscle weakness, muscle pain, numbness, among others.        [place]    Nerve / Sites Muscle Latency Ref. Amplitude Ref. Rel Amp Segments Distance Velocity Ref. Area    ms ms mV mV %  cm m/s m/s mVms  R Median - APB     Wrist APB 9.4 ?4.4  ?4.0  Wrist - APB 7        Upper arm APB 8.5  4.8   Upper arm - Wrist 24 268 ?49 16.5  R Ulnar - ADM     Wrist ADM 1.7 ?3.3 2.5 ?6.0 100 Wrist - ADM 7   5.2     B.Elbow ADM 5.6  8.4  335 B.Elbow - Wrist   ?49 28.8     A.Elbow ADM 9.2  6.7  79.4 A.Elbow - B.Elbow 16 44 ?49 23.8  R Peroneal - EDB     Ankle EDB 4.2 ?6.5 5.1 ?2.0 100 Ankle - EDB 9   16.9     Fib head EDB 10.4  4.7  93 Fib head - Ankle 27 43 ?44 16.5     Pop fossa EDB 13.1  5.0  106 Pop fossa - Fib head 15 55 ?44 18.1         Pop fossa - Ankle      R Tibial - AH     Ankle AH 5.8 ?5.8 1.1 ?4.0 100 Ankle - AH 9   1.5     Pop fossa AH 14.8  0.3  30.5 Pop fossa - Ankle 47 52 ?  75              SNC    Nerve / Sites Rec. Site Peak Lat Ref.  Amp Ref. Segments Distance    ms ms V V  cm  R Sural - Ankle (Calf)     Calf Ankle NR ?4.4 NR ?6 Calf - Ankle 14  R Superficial peroneal - Ankle     Lat leg Ankle NR ?4.4 NR ?6 Lat leg - Ankle 14  R Median - Orthodromic (Dig II, Mid palm)     Dig II Wrist 3.4 ?3.4 5 ?10 Dig II - Wrist 13  R Ulnar - Orthodromic, (Dig V, Mid palm)     Dig V Wrist 2.7 ?3.1 7 ?5 Dig V - Wrist 73             F  Wave    Nerve F Lat Ref.   ms ms  R Ulnar - ADM 34.7 ?32.0  R Tibial - AH 52.9 ?56.0         EMG Summary Table    Spontaneous MUAP Recruitment  Muscle IA Fib PSW Fasc Other Amp Dur. Poly Pattern  R.  Tibialis anterior Normal None None None _______ Normal Normal Normal Normal  R. Tibialis posterior Normal None None None _______ Normal Normal Normal Normal  R. Peroneus longus Normal None None None _______ Normal Normal Normal Normal  R. Gastrocnemius (Medial head) Normal None None None _______ Normal Normal Normal Normal  R. Vastus lateralis Normal None None None _______ Normal Normal Normal Normal  L. Tibialis anterior Normal None None None _______ Normal Normal Normal Normal  L. Tibialis posterior Normal None None None _______ Normal Normal Normal Normal  L. Peroneus longus Normal None None None _______ Normal Normal Normal Normal  L. Gastrocnemius (Medial head) Normal None None None _______ Normal Normal Normal Normal  L. Vastus lateralis Normal None None None _______ Normal Normal Normal Normal  R. Biceps femoris (long head) Normal None None None _______ Normal Normal Normal Normal  R. Biceps femoris (short head) Normal None None None _______ Normal Normal Normal Normal  L. Lumbar paraspinals (low) Normal None None None _______ Normal Normal Normal Normal  L. Lumbar paraspinals (mid) Normal None None None _______ Normal Normal Normal Normal  R. Lumbar paraspinals (low) Normal None None None _______ Normal Normal Normal Normal  R. Lumbar paraspinals (mid) Normal None None None _______ Normal Normal Normal Normal  R. First dorsal interosseous Normal None None None _______ Normal Normal Normal Normal  R. Biceps brachii Normal None None None _______ Normal Normal Normal Normal  R. Deltoid Normal None None None _______ Normal Normal Normal Normal  R. Triceps brachii Normal None None None _______ Normal Normal Normal Normal  R. Brachioradialis Normal None None None _______ Increased Increased 1+ Reduced  R. Pronator teres Normal None None None _______ Normal Normal Normal Normal  R. Cervical paraspinals Increased None None None _______ Increased Increased 1+ Normal

## 2022-05-24 NOTE — Progress Notes (Signed)
EMG is under procedure tab

## 2022-05-25 ENCOUNTER — Encounter (HOSPITAL_COMMUNITY): Payer: Self-pay | Admitting: Internal Medicine

## 2022-05-25 DIAGNOSIS — M545 Low back pain, unspecified: Secondary | ICD-10-CM | POA: Diagnosis not present

## 2022-05-25 DIAGNOSIS — R972 Elevated prostate specific antigen [PSA]: Secondary | ICD-10-CM | POA: Diagnosis not present

## 2022-05-25 DIAGNOSIS — M5136 Other intervertebral disc degeneration, lumbar region: Secondary | ICD-10-CM | POA: Diagnosis not present

## 2022-05-25 DIAGNOSIS — E559 Vitamin D deficiency, unspecified: Secondary | ICD-10-CM | POA: Diagnosis not present

## 2022-05-25 NOTE — Telephone Encounter (Signed)
I sent to the MRI order to [facility] again.

## 2022-05-26 ENCOUNTER — Encounter: Payer: Self-pay | Admitting: Urology

## 2022-05-27 ENCOUNTER — Telehealth: Payer: Self-pay

## 2022-05-27 NOTE — Telephone Encounter (Signed)
-----   Message from Dorisann Frames, RN sent at 05/27/2022  8:21 AM EST ----- Please see Dr. Jeffie Pollock message and notify patient of results and we will wait for MRI ----- Message ----- From: Irine Seal, MD Sent: 05/26/2022   8:15 PM EST To: Dorisann Frames, RN  The ExoDx test is quite elevated at 92 so he is definitely going to need a biopsy but I want to see what his MRI shows and that hasn't been done yet.

## 2022-05-27 NOTE — Telephone Encounter (Signed)
Tried calling patient with no answer will follow back up.

## 2022-05-28 ENCOUNTER — Other Ambulatory Visit: Payer: Self-pay

## 2022-05-28 DIAGNOSIS — R972 Elevated prostate specific antigen [PSA]: Secondary | ICD-10-CM

## 2022-05-28 NOTE — Telephone Encounter (Signed)
-----   Message from Dorisann Frames, RN sent at 05/27/2022  8:21 AM EST ----- Please see Dr. Jeffie Pollock message and notify patient of results and we will wait for MRI ----- Message ----- From: Irine Seal, MD Sent: 05/26/2022   8:15 PM EST To: Dorisann Frames, RN  The ExoDx test is quite elevated at 92 so he is definitely going to need a biopsy but I want to see what his MRI shows and that hasn't been done yet.

## 2022-05-28 NOTE — Progress Notes (Signed)
Open in error

## 2022-05-28 NOTE — Telephone Encounter (Signed)
Made patient aware that his ExoDx test is quite elevated at 92 so he is definitely going to need a biopsy. Dr. Jeffie Pollock  want to see what his MRI shows and that hasn't been done yet. Patient is scheduled for MRI 12/18.Patient voiced understanding.

## 2022-05-29 ENCOUNTER — Other Ambulatory Visit (HOSPITAL_COMMUNITY): Payer: Self-pay | Admitting: Internal Medicine

## 2022-06-02 ENCOUNTER — Telehealth: Payer: Self-pay | Admitting: *Deleted

## 2022-06-02 DIAGNOSIS — G4733 Obstructive sleep apnea (adult) (pediatric): Secondary | ICD-10-CM

## 2022-06-02 DIAGNOSIS — G4719 Other hypersomnia: Secondary | ICD-10-CM

## 2022-06-02 DIAGNOSIS — I10 Essential (primary) hypertension: Secondary | ICD-10-CM

## 2022-06-02 DIAGNOSIS — I2510 Atherosclerotic heart disease of native coronary artery without angina pectoris: Secondary | ICD-10-CM

## 2022-06-02 NOTE — Telephone Encounter (Signed)
-----   Message from Lauralee Evener, [place] sent at 05/08/2022  8:57 AM EDT -----  ----- Message ----- From: Sueanne Margarita, MD Sent: 05/07/2022  10:37 PM EDT To: [facility] Sleep Studies  Normal home sleep study so in lab PSG will be ordered

## 2022-06-02 NOTE — Telephone Encounter (Signed)
The patient has been notified of the result and verbalized understanding.  All questions (if any) were answered. Marolyn Hammock, CMA 06/02/2022 7:74 PM    Will precert the NPSG

## 2022-06-15 ENCOUNTER — Ambulatory Visit (HOSPITAL_COMMUNITY): Payer: BC Managed Care – PPO

## 2022-06-16 ENCOUNTER — Ambulatory Visit (HOSPITAL_COMMUNITY): Payer: BC Managed Care – PPO

## 2022-06-19 ENCOUNTER — Ambulatory Visit (HOSPITAL_BASED_OUTPATIENT_CLINIC_OR_DEPARTMENT_OTHER): Payer: BC Managed Care – PPO

## 2022-06-19 ENCOUNTER — Ambulatory Visit (HOSPITAL_BASED_OUTPATIENT_CLINIC_OR_DEPARTMENT_OTHER): Admission: RE | Admit: 2022-06-19 | Payer: BC Managed Care – PPO | Source: Ambulatory Visit

## 2022-06-22 ENCOUNTER — Ambulatory Visit: Payer: BC Managed Care – PPO | Admitting: Pulmonary Disease

## 2022-06-22 DIAGNOSIS — Z23 Encounter for immunization: Secondary | ICD-10-CM | POA: Diagnosis not present

## 2022-06-24 ENCOUNTER — Telehealth: Payer: Self-pay | Admitting: Neurology

## 2022-06-24 DIAGNOSIS — M545 Low back pain, unspecified: Secondary | ICD-10-CM

## 2022-06-24 DIAGNOSIS — R269 Unspecified abnormalities of gait and mobility: Secondary | ICD-10-CM

## 2022-06-24 NOTE — Telephone Encounter (Signed)
Patient asked for a call about what to do going forward

## 2022-06-24 NOTE — Telephone Encounter (Signed)
Orders Placed This Encounter  Procedures   DG Lumbar Spine 2-3 Views   Please tell patient, MRI of lumbar spine was denied by his insurance company ordered x-ray of lumbar spine at [facility] imaging, he can have x-ray taken without appointment, we will notify him of the result,

## 2022-06-24 NOTE — Addendum Note (Signed)
Addended by: Marcial Pacas on: 06/24/2022 05:16 PM   Modules accepted: Orders

## 2022-06-24 NOTE — Telephone Encounter (Signed)
MRI was denied by [employer]: Your doctor told us that you have been treated for low back pain. Your doctor ordered an MRI of your lower back. An MRI is a way to take pictures of the inside of your body. This test should be used if it is likely to result in a specific change in your treatment. Such a change might be related to the need for surgery or a procedure. We reviewed the notes we have. The notes do not show that you are going to have surgery or a procedure. Based on the information we have, this test is not medically necessary for you at this time. We used [employer] Medical Benefits Management Clinical Guideline titled Imaging of the Spine to make this decision. You may view this guideline at www.[employer].com/mbm-guidelines-radiology.  It was case #[id number]

## 2022-06-25 NOTE — Telephone Encounter (Signed)
Contacted pt back, informed him from the information we have they stated . The notes do not show that you are going to have surgery or a procedure. Based on the information we have, this test is not medically necessary for you at this time. He can further discuss with insurance. He stated he will go there Monday and get it done.

## 2022-06-25 NOTE — Telephone Encounter (Signed)
Pt is called. Stated he wants to talk to nurse about why his MRI was denied. Pt is requesting a call back.

## 2022-06-26 ENCOUNTER — Ambulatory Visit (HOSPITAL_BASED_OUTPATIENT_CLINIC_OR_DEPARTMENT_OTHER)
Admission: RE | Admit: 2022-06-26 | Discharge: 2022-06-26 | Disposition: A | Discharge status: Home / Self Care | Payer: BC Managed Care – PPO | Source: Ambulatory Visit | Attending: Acute Care | Admitting: Acute Care

## 2022-06-26 ENCOUNTER — Ambulatory Visit (HOSPITAL_COMMUNITY): Payer: BC Managed Care – PPO

## 2022-06-26 ENCOUNTER — Encounter: Payer: Self-pay | Admitting: Urology

## 2022-06-26 ENCOUNTER — Ambulatory Visit (HOSPITAL_BASED_OUTPATIENT_CLINIC_OR_DEPARTMENT_OTHER): Payer: BC Managed Care – PPO

## 2022-06-26 DIAGNOSIS — R911 Solitary pulmonary nodule: Secondary | ICD-10-CM | POA: Diagnosis not present

## 2022-06-26 DIAGNOSIS — I7121 Aneurysm of the ascending aorta, without rupture: Secondary | ICD-10-CM | POA: Diagnosis not present

## 2022-06-29 ENCOUNTER — Ambulatory Visit
Admission: RE | Admit: 2022-06-29 | Discharge: 2022-06-29 | Disposition: A | Discharge status: Home / Self Care | Payer: BC Managed Care – PPO | Source: Ambulatory Visit | Attending: Urology | Admitting: Urology

## 2022-06-29 DIAGNOSIS — R972 Elevated prostate specific antigen [PSA]: Secondary | ICD-10-CM

## 2022-06-29 MED ORDER — GADOPICLENOL 0.5 MMOL/ML IV SOLN
10.0000 mL | Freq: Once | INTRAVENOUS | Status: AC | PRN
  Administered 2022-06-29: 10 mL via INTRAVENOUS

## 2022-06-30 ENCOUNTER — Telehealth: Payer: Self-pay | Admitting: Cardiology

## 2022-06-30 NOTE — Telephone Encounter (Signed)
Pt asked to speak to Gae Bon about verifying some insurance issue

## 2022-07-02 ENCOUNTER — Ambulatory Visit
Admission: EM | Admit: 2022-07-02 | Discharge: 2022-07-02 | Disposition: A | Discharge status: Home / Self Care | Payer: BC Managed Care – PPO | Attending: Nurse Practitioner | Admitting: Nurse Practitioner

## 2022-07-02 ENCOUNTER — Other Ambulatory Visit: Payer: Self-pay | Admitting: Urology

## 2022-07-02 DIAGNOSIS — R972 Elevated prostate specific antigen [PSA]: Secondary | ICD-10-CM

## 2022-07-02 DIAGNOSIS — N403 Nodular prostate with lower urinary tract symptoms: Secondary | ICD-10-CM

## 2022-07-02 DIAGNOSIS — H6121 Impacted cerumen, right ear: Secondary | ICD-10-CM

## 2022-07-02 MED ORDER — LEVOFLOXACIN 750 MG PO TABS: ORAL_TABLET | ORAL | 0 refills | Status: DC

## 2022-07-02 NOTE — Discharge Instructions (Signed)
We were able to flush the earwax out of your right ear today.  Please discontinue use of Q-tips.

## 2022-07-02 NOTE — ED Provider Notes (Signed)
[facility]    CSN: [id number] Arrival date & time: 07/02/22  1157      History   Chief Complaint No chief complaint on file.   HPI Andrew Arnold is a 66 y.o. male.   Patient presents for 1 day history of right ear feeling clogged.  Reports he was using Q-tips yesterday and he thinks the Q-tip broke off in his ear.  He does use Q-tips on a regular basis.  His hearing is muffled. No drainage from the ear.  No ear pain or drainage.  No recent cough, sore throat, or congestion.  Patient does typically use Q-tips on a regular basis, however he is aware that he should not do this.  He does have a history of ceruminosis and has had his ears flushed before.     Past Medical History:  Diagnosis Date   Allergic rhinitis due to pollen    on allergy shots   Arrhythmia    1980s hospitalized for HTN and ?irregular rhythm but no specific dx but resolved during hospitalization   Diabetes mellitus without complication ([place])    HTN (hypertension)    Hyperlipidemia    OSA (obstructive sleep apnea)    mild obstructive sleep apnea with an AHI of 10.8/h with oxygen saturations as low as 85%.    Patient Active Problem List   Diagnosis Date Noted   OSA (obstructive sleep apnea) 04/23/2022   H/O adenomatous polyp of colon 03/27/2022   Normocytic anemia 03/27/2022   Gait abnormality 03/10/2022   Chronic low back pain 03/10/2022   Paresthesia 03/10/2022   CAP (community acquired pneumonia) 08/01/2021   Nausea and vomiting 08/01/2021   DM2 (diabetes mellitus, type 2) ([place]) 08/01/2021   Coronary artery disease involving native coronary artery of native heart without angina pectoris 07/31/2021   Stenosis of cervical spine with myelopathy ([place]) 06/03/2021   Macrocytic anemia 05/15/2021   Bilateral lower extremity edema 05/15/2021   BPH (benign prostatic hyperplasia) 05/15/2021   Elevated PSA 05/15/2021   Cervical spinal stenosis 05/15/2021   DDD (degenerative disc disease),  cervical 05/15/2021   Syncope 05/14/2021   Elevated hemoglobin A1c 04/10/2021   GERD (gastroesophageal reflux disease) 08/29/2017   Encounter for screening colonoscopy 02/01/2014   Essential hypertension 08/14/2008   Seasonal and perennial allergic rhinitis 04/23/2007    Past Surgical History:  Procedure Laterality Date   ANTERIOR CERVICAL DECOMP/DISCECTOMY FUSION N/A 06/03/2021   Procedure: Anterior Cervical Decompression Fusion  Cervical three-four;  Surgeon: Vallarie Mare, MD;  Location: [facility];  Service: Neurosurgery;  Laterality: N/A;   colonoscopy  2005   Dr. Gala Romney: normal rectum, sigmoid diverticula in colonic mucosa   COLONOSCOPY N/A 02/15/2014   Procedure: COLONOSCOPY;  Surgeon: Daneil Dolin, MD;  [facility];  Service: Endoscopy;  Laterality: N/A;  2:00   COLONOSCOPY WITH PROPOFOL N/A 05/08/2022   Procedure: COLONOSCOPY WITH PROPOFOL;  Surgeon: Daneil Dolin, MD;  [facility];  Service: Endoscopy;  Laterality: N/A;  11:00 am   RIGHT/LEFT HEART CATH AND CORONARY ANGIOGRAPHY N/A 05/23/2021   Procedure: RIGHT/LEFT HEART CATH AND CORONARY ANGIOGRAPHY;  Surgeon: Jolaine Artist, MD;  Location: [facility];  Service: Cardiovascular;  Laterality: N/A;       Home Medications    Prior to Admission medications   Medication Sig Start Date End Date Taking? Authorizing Provider  amLODipine (NORVASC) 10 MG tablet Take 1 tablet (10 mg total) by mouth daily. 08/06/21   Candiss Norse,  Margaree Mackintosh, MD  ascorbic acid (VITAMIN C) 500 MG tablet Take 500 mg by mouth every morning.     [provider]  aspirin 81 MG chewable tablet Chew 1 tablet (81 mg total) by mouth daily. 08/06/21   Thurnell Lose, MD  atorvastatin (LIPITOR) 80 MG tablet Take 80 mg by mouth at bedtime.    [provider]  B Complex Vitamins (B COMPLEX 100 PO) Take 1 tablet by mouth every morning.     [provider]  carvedilol (COREG) 6.25 MG tablet Take 1 tablet  (6.25 mg total) by mouth 2 (two) times daily with a meal. 08/05/21   Thurnell Lose, MD  cetirizine (ZYRTEC) 10 MG tablet Take 10 mg by mouth in the morning.    [provider]  cyanocobalamin (VITAMIN B12) 1000 MCG tablet Take 2,000 mcg by mouth daily.    [provider]  dapagliflozin propanediol (FARXIGA) 10 MG TABS tablet Take 1 tablet (10 mg total) by mouth daily before breakfast. 09/24/21   Milford, Maricela Bo, FNP  levofloxacin (LEVAQUIN) 750 MG tablet Take one hour prior to prostate biopsy procedure. 07/02/22   Irine Seal, MD  metFORMIN (GLUCOPHAGE) 1000 MG tablet Take 1,000 mg by mouth 2 (two) times daily. 03/21/21   [provider]  pantoprazole (PROTONIX) 40 MG tablet Take 40 mg by mouth daily.    [provider]  potassium chloride SA (KLOR-CON M20) 20 MEQ tablet Take 1 tablet (20 mEq total) by mouth daily. 08/14/21 05/08/22  Rafael Bihari, FNP  sacubitril-valsartan (ENTRESTO) 24-26 MG Take 1 tablet by mouth 2 (two) times daily. 08/13/21   Bensimhon, Shaune Pascal, MD  spironolactone (ALDACTONE) 25 MG tablet TAKE 1/2 TABLET(12.5 MG) BY MOUTH DAILY 05/29/22   Bensimhon, Shaune Pascal, MD  tamsulosin (FLOMAX) 0.4 MG CAPS capsule Take 1 capsule (0.4 mg total) by mouth daily. 11/13/21   Irine Seal, MD    Family History Family History  Problem Relation Age of Onset   Cancer Mother    Cirrhosis Father        cirrhosis of the liver; alcohol related   Diabetes Sister    Other Other        neice was on allergy vaccine when young   Colon cancer Neg Hx     Social History Social History   Tobacco Use   Smoking status: Never   Smokeless tobacco: Never  Vaping Use   Vaping Use: Never used  Substance Use Topics   Alcohol use: No   Drug use: No     Allergies   Penicillins   Review of Systems Review of Systems Per HPI  Physical Exam Triage Vital Signs ED Triage Vitals  Enc Vitals Group     BP 07/02/22 1256 126/76     Pulse Rate 07/02/22 1256 76      Resp 07/02/22 1256 20     Temp 07/02/22 1256 98.2 F (36.8 C)     Temp Source 07/02/22 1256 Oral     SpO2 07/02/22 1256 98 %     Weight --      Height --      Head Circumference --      Peak Flow --      Pain Score 07/02/22 1258 0     Pain Loc --      Pain Edu? --      Excl. in [place]? --    No data found.  Updated Vital Signs BP 126/76 (BP  Location: Right Arm)   Pulse 76   Temp 98.2 F (36.8 C) (Oral)   Resp 20   SpO2 98%   Visual Acuity Right Eye Distance:   Left Eye Distance:   Bilateral Distance:    Right Eye Near:   Left Eye Near:    Bilateral Near:     Physical Exam Vitals and nursing note reviewed.  Constitutional:      General: He is not in acute distress.    Appearance: Normal appearance. He is not toxic-appearing.  HENT:     Head: Normocephalic and atraumatic.     Right Ear: There is impacted cerumen.     Left Ear: Tympanic membrane, ear canal and external ear normal. There is no impacted cerumen.     Nose: Nose normal. No congestion.     Mouth/Throat:     Mouth: Mucous membranes are moist.     Pharynx: Oropharynx is clear.  Eyes:     General: No scleral icterus.    Extraocular Movements: Extraocular movements intact.  Pulmonary:     Effort: Pulmonary effort is normal. No respiratory distress.  Skin:    General: Skin is warm and dry.     Capillary Refill: Capillary refill takes less than 2 seconds.     Coloration: Skin is not jaundiced or pale.     Findings: No erythema.  Neurological:     Mental Status: He is alert and oriented to person, place, and time.  Psychiatric:        Behavior: Behavior is cooperative.      UC Treatments / Results  Labs (all labs ordered are listed, but only abnormal results are displayed) Labs Reviewed - No data to display  EKG   Radiology No results found.  Procedures Ear Cerumen Removal  Date/Time: 07/02/2022 2:00 PM  Performed by: Eulogio Bear, NP Authorized by: Eulogio Bear, NP    Consent:    Consent obtained:  Verbal   Consent given by:  Patient   Risks, benefits, and alternatives were discussed: yes     Risks discussed:  Bleeding, infection, incomplete removal, TM perforation, dizziness and pain   Alternatives discussed:  Alternative treatment Universal protocol:    Procedure explained and questions answered to patient or proxy's satisfaction: yes     Patient identity confirmed:  Verbally with patient Procedure details:    Location:  R ear   Procedure type: irrigation     Procedure outcomes: cerumen removed   Post-procedure details:    Inspection:  No bleeding and TM intact   Hearing quality:  Improved   Procedure completion:  Tolerated well, no immediate complications  (including critical care time)  Medications Ordered in UC Medications - No data to display  Initial Impression / Assessment and Plan / UC Course  I have reviewed the triage vital signs and the nursing notes.  Pertinent labs & imaging results that were available during my care of the patient were reviewed by me and considered in my medical decision making (see chart for details).   Patient is well-appearing, normotensive, afebrile, not tachycardic, not tachypneic, oxygenating well on room air.    Impacted cerumen, right ear Ear lavage provided as above Patient tolerated well and cerumen completely removed without incident Encouraged discontinuing use of Q-tips   The patient was given the opportunity to ask questions.  All questions answered to their satisfaction.  The patient is in agreement to this plan.    Final Clinical Impressions(s) / UC Diagnoses  Final diagnoses:  Impacted cerumen, right ear     Discharge Instructions      We were able to flush the earwax out of your right ear today.  Please discontinue use of Q-tips.     ED Prescriptions   None    PDMP not reviewed this encounter.   Eulogio Bear, NP 07/02/22 1401

## 2022-07-02 NOTE — ED Triage Notes (Signed)
Pt reports he needs his right ear flushed out. Pt was digging in his ear with a q tip and it "got stuck" x 1 day. Sounds muffled when he talks.

## 2022-07-02 NOTE — Progress Notes (Signed)
He has 2 PIRADS 4 lesions on his recent MRI and will need an MR fusion biopsy in [place].  I have sent Levaquin and called him and reviewed the risks of bleeding, infection and voiding difficulty.  I have put in a request for a referral to the [facility] office for the procedure.

## 2022-07-09 ENCOUNTER — Ambulatory Visit
Admission: RE | Admit: 2022-07-09 | Discharge: 2022-07-09 | Disposition: A | Discharge status: Home / Self Care | Payer: BC Managed Care – PPO | Source: Ambulatory Visit | Attending: Neurology | Admitting: Neurology

## 2022-07-09 DIAGNOSIS — M545 Low back pain, unspecified: Secondary | ICD-10-CM | POA: Diagnosis not present

## 2022-07-09 DIAGNOSIS — R269 Unspecified abnormalities of gait and mobility: Secondary | ICD-10-CM

## 2022-07-10 NOTE — Telephone Encounter (Signed)
Return call completed. READY- NO PA REQ- REF# [id number]

## 2022-07-12 ENCOUNTER — Telehealth: Payer: Self-pay | Admitting: Gastroenterology

## 2022-07-12 NOTE — Telephone Encounter (Signed)
Following up on pending items. He did not have anemia labs done during preop. Would recommend anemia labs at his convenience.   Iron/tibc/ferritin, b12, folate, cbc

## 2022-07-13 DIAGNOSIS — C801 Malignant (primary) neoplasm, unspecified: Secondary | ICD-10-CM

## 2022-07-13 HISTORY — DX: Malignant (primary) neoplasm, unspecified: C80.1

## 2022-07-14 ENCOUNTER — Other Ambulatory Visit: Payer: Self-pay

## 2022-07-14 DIAGNOSIS — D649 Anemia, unspecified: Secondary | ICD-10-CM | POA: Diagnosis not present

## 2022-07-14 NOTE — Telephone Encounter (Signed)
Labs were ordered and pt was made aware and verbalized understanding.

## 2022-07-15 LAB — CBC WITH DIFFERENTIAL/PLATELET
Absolute Monocytes: 366 cells/uL (ref 200–950)
Basophils Absolute: 20 cells/uL (ref 0–200)
Basophils Relative: 0.6 %
Eosinophils Absolute: 50 cells/uL (ref 15–500)
Eosinophils Relative: 1.5 %
HCT: 34.9 % — ABNORMAL LOW (ref 38.5–50.0)
Hemoglobin: 11.9 g/dL — ABNORMAL LOW (ref 13.2–17.1)
Lymphs Abs: 1106 cells/uL (ref 850–3900)
MCH: 32.5 pg (ref 27.0–33.0)
MCHC: 34.1 g/dL (ref 32.0–36.0)
MCV: 95.4 fL (ref 80.0–100.0)
MPV: 10.7 fL (ref 7.5–12.5)
Monocytes Relative: 11.1 %
Neutro Abs: 1759 cells/uL (ref 1500–7800)
Neutrophils Relative %: 53.3 %
Platelets: 228 10*3/uL (ref 140–400)
RBC: 3.66 10*6/uL — ABNORMAL LOW (ref 4.20–5.80)
RDW: 13.7 % (ref 11.0–15.0)
Total Lymphocyte: 33.5 %
WBC: 3.3 10*3/uL — ABNORMAL LOW (ref 3.8–10.8)

## 2022-07-15 LAB — IRON,TIBC AND FERRITIN PANEL
%SAT: 14 % (calc) — ABNORMAL LOW (ref 20–48)
Ferritin: 107 ng/mL (ref 24–380)
Iron: 42 ug/dL — ABNORMAL LOW (ref 50–180)
TIBC: 296 mcg/dL (calc) (ref 250–425)

## 2022-07-15 LAB — B12 AND FOLATE PANEL
Folate: >24 ng/mL
Vitamin B-12: 768 pg/mL (ref 200–1100)

## 2022-07-16 ENCOUNTER — Ambulatory Visit: Payer: BC Managed Care – PPO | Admitting: Pulmonary Disease

## 2022-07-16 ENCOUNTER — Encounter: Payer: Self-pay | Admitting: Pulmonary Disease

## 2022-07-16 VITALS — BP 90/70 | HR 76 | Ht 69.0 in | Wt 236.6 lb

## 2022-07-16 DIAGNOSIS — I7121 Aneurysm of the ascending aorta, without rupture: Secondary | ICD-10-CM | POA: Diagnosis not present

## 2022-07-16 DIAGNOSIS — R911 Solitary pulmonary nodule: Secondary | ICD-10-CM | POA: Diagnosis not present

## 2022-07-16 DIAGNOSIS — R918 Other nonspecific abnormal finding of lung field: Secondary | ICD-10-CM

## 2022-07-16 NOTE — Patient Instructions (Signed)
Thank you for visiting Dr. Valeta Harms at [facility]. Today we recommend the following:  Orders Placed This Encounter  Procedures   Ambulatory referral to Cardiothoracic Surgery   Return if symptoms worsen or fail to improve.    Please do your part to reduce the spread of COVID-19.

## 2022-07-16 NOTE — Progress Notes (Signed)
Synopsis: Referred in February 2023 for lung nodule by [name], MD  Subjective:   PATIENT ID: [name] GENDER: male DOB: [birth year], MRN: [id number]  Chief Complaint  Patient presents with   Follow-up    This is a 67 year old gentleman, past medical history of hypertension, hyperlipidemia, type 2 diabetes referred for evaluation of lung nodule by Dr. Candiss Norse.Patient had a CT scan of the chest on presentation to the hospital on 07/30/2021.  The CT scan of the chest revealed multiple patchy nodular densities in the left lower lobe largest of which was 2.5 cm inside suggestive of early pneumonia.  Possibility 1 of these nodular areas being a malignancy was not quite excluded based on CT and they recommended short-term CT scan follow-up.  Patient was referred here today for evaluation and close follow-up of abnormal CT imaging.  From respiratory standpoint patient doing well.  We reviewed his CT imaging today in the office.  Patient's mother had breast cancer, no family history of lung cancer.  OV 09/17/2021:Here today for follow-up after recent CT scan of the chest.  This was in follow-up for lung nodules.  This showed decreased size of the nodular opacities in the left lower lobe concerning for an inflammatory or infectious process.  Has a stable 6 mm subpleural nodule.  We reviewed patient's CT imaging today in the office.  OV 07/16/2022: Here today for follow-up after recent CT scan of the chest.  He had a recent CT completed on 06/26/2022.  This showed continued decrease in size of this little tiny nodule.  Now 4 mm in size appears to be inflammatory and benign.  No additional follow-up needed.  Reviewed his CT scan imaging today in the office.  Patient was very happy to have the new findings.Also found to have a 4 cm thoracic aneurysm.  He will need to have this followed annually.  He has a tiny hiatal hernia as well.    Past Medical History:  Diagnosis Date   Allergic rhinitis  due to pollen    on allergy shots   Arrhythmia    1980s hospitalized for HTN and ?irregular rhythm but no specific dx but resolved during hospitalization   Diabetes mellitus without complication ([place])    HTN (hypertension)    Hyperlipidemia    OSA (obstructive sleep apnea)    mild obstructive sleep apnea with an AHI of 10.8/h with oxygen saturations as low as 85%.     Family History  Problem Relation Age of Onset   Cancer Mother    Cirrhosis Father        cirrhosis of the liver; alcohol related   Diabetes Sister    Other Other        neice was on allergy vaccine when young   Colon cancer Neg Hx      Past Surgical History:  Procedure Laterality Date   ANTERIOR CERVICAL DECOMP/DISCECTOMY FUSION N/A 06/03/2021   Procedure: Anterior Cervical Decompression Fusion  Cervical three-four;  Surgeon: Vallarie Mare, MD;  Location: [facility];  Service: Neurosurgery;  Laterality: N/A;   colonoscopy  2005   Dr. Gala Romney: normal rectum, sigmoid diverticula in colonic mucosa   COLONOSCOPY N/A 02/15/2014   Procedure: COLONOSCOPY;  Surgeon: Daneil Dolin, MD;  [facility];  Service: Endoscopy;  Laterality: N/A;  2:00   COLONOSCOPY WITH PROPOFOL N/A 05/08/2022   Procedure: COLONOSCOPY WITH PROPOFOL;  Surgeon: Daneil Dolin, MD;  [facility];  Service: Endoscopy;  Laterality:  N/A;  11:00 am   RIGHT/LEFT HEART CATH AND CORONARY ANGIOGRAPHY N/A 05/23/2021   Procedure: RIGHT/LEFT HEART CATH AND CORONARY ANGIOGRAPHY;  Surgeon: Jolaine Artist, MD;  Location: [facility];  Service: Cardiovascular;  Laterality: N/A;    Social History   Socioeconomic History   Marital status: Single    Spouse name: Not on file   Number of children: Not on file   Years of education: Not on file   Highest education level: Not on file  Occupational History   Occupation: school counselor  Tobacco Use   Smoking status: Never   Smokeless tobacco: Never  Vaping Use   Vaping Use: Never  used  Substance and Sexual Activity   Alcohol use: No   Drug use: No   Sexual activity: Yes  Other Topics Concern   Not on file  Social History Narrative   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     amLODipine (NORVASC) 10 MG tablet Take 1 tablet (10 mg total) by mouth daily. 30 tablet 0   ascorbic acid (VITAMIN C) 500 MG tablet Take 500 mg by mouth every morning.      aspirin 81 MG chewable tablet Chew 1 tablet (81 mg total) by mouth daily. 30 tablet 0   atorvastatin (LIPITOR) 80 MG tablet Take 80 mg by mouth at bedtime.     B Complex Vitamins (B COMPLEX 100 PO) Take 1 tablet by mouth every morning.      carvedilol (COREG) 6.25 MG tablet Take 1 tablet (6.25 mg total) by mouth 2 (two) times daily with a meal. 60 tablet 0   cyanocobalamin (VITAMIN B12) 1000 MCG tablet Take 2,000 mcg by mouth daily.     dapagliflozin propanediol (FARXIGA) 10 MG TABS tablet Take 1 tablet (10 mg total) by mouth daily before breakfast. 90 tablet 3   levofloxacin (LEVAQUIN) 750 MG tablet Take one hour prior to prostate biopsy procedure. 1 tablet 0   metFORMIN (GLUCOPHAGE) 1000 MG tablet Take 1,000 mg by mouth 2 (two) times daily.     pantoprazole (PROTONIX) 40 MG tablet Take 40 mg by mouth daily.     potassium chloride SA (KLOR-CON M20) 20 MEQ tablet Take 1 tablet (20 mEq total) by mouth daily. 90 tablet 3   sacubitril-valsartan (ENTRESTO) 24-26 MG Take 1 tablet by mouth 2 (two) times daily. 180  tablet 3   spironolactone (ALDACTONE) 25 MG tablet TAKE 1/2 TABLET(12.5 MG) BY MOUTH DAILY 15 tablet 6   tamsulosin (FLOMAX) 0.4 MG CAPS capsule Take 1 capsule (0.4 mg total) by mouth daily. 90 capsule 3   No facility-administered medications prior to visit.    Review of Systems  Constitutional:  Negative for chills, fever, malaise/fatigue and weight loss.  HENT:  Negative for hearing loss, sore throat and tinnitus.   Eyes:  Negative for  blurred vision and double vision.  Respiratory:  Negative for cough, hemoptysis, sputum production, shortness of breath, wheezing and stridor.   Cardiovascular:  Negative for chest pain, palpitations, orthopnea, leg swelling and PND.  Gastrointestinal:  Negative for abdominal pain, constipation, diarrhea, heartburn, nausea and vomiting.  Genitourinary:  Negative for dysuria, hematuria and urgency.  Musculoskeletal:  Negative for joint pain and myalgias.  Skin:  Negative for itching and rash.  Neurological:  Negative for dizziness, tingling, weakness and headaches.  Endo/Heme/Allergies:  Negative for environmental allergies. Does not bruise/bleed easily.  Psychiatric/Behavioral:  Negative for depression. The patient is not nervous/anxious and does not have insomnia.   All other systems reviewed and are negative.    Objective:  Physical Exam Vitals reviewed.  Constitutional:      General: He is not in acute distress.    Appearance: He is well-developed. He is obese.  HENT:     Head: Normocephalic and atraumatic.  Eyes:     General: No scleral icterus.    Conjunctiva/sclera: Conjunctivae normal.     Pupils: Pupils are equal, round, and reactive to light.  Neck:     Vascular: No JVD.     Trachea: No tracheal deviation.  Cardiovascular:     Rate and Rhythm: Normal rate and regular rhythm.     Heart sounds: Normal heart sounds. No murmur heard. Pulmonary:     Effort: Pulmonary effort is normal. No tachypnea, accessory muscle usage or respiratory  distress.     Breath sounds: No stridor. No wheezing, rhonchi or rales.  Abdominal:     General: There is no distension.     Palpations: Abdomen is soft.     Tenderness: There is no abdominal tenderness.  Musculoskeletal:        General: No tenderness.     Cervical back: Neck supple.  Lymphadenopathy:     Cervical: No cervical adenopathy.  Skin:    General: Skin is warm and dry.     Capillary Refill: Capillary refill takes less than 2 seconds.     Findings: No rash.  Neurological:     Mental Status: He is alert and oriented to person, place, and time.  Psychiatric:        Behavior: Behavior normal.      Vitals:   07/16/22 1416  Weight: 236 lb 9.6 oz (107.3 kg)  Height: '5\' 9"'$  (1.753 m)     on RA BMI Readings from Last 3 Encounters:  07/16/22 34.94 kg/m  05/08/22 33.79 kg/m  05/06/22 33.79 kg/m   Wt Readings from Last 3 Encounters:  07/16/22 236 lb 9.6 oz (107.3 kg)  05/08/22 228 lb 13.4 oz (103.8 kg)  05/06/22 228 lb 12.8 oz (103.8 kg)     CBC    Component Value Date/Time   WBC 3.3 (L) 07/14/2022 1339   RBC 3.66 (L) 07/14/2022 1339   HGB 11.9 (L) 07/14/2022 1339   HCT 34.9 (L) 07/14/2022 1339   PLT 228 07/14/2022 1339   MCV 95.4 07/14/2022 1339   MCH 32.5 07/14/2022 1339   MCHC 34.1 07/14/2022 1339   RDW 13.7 07/14/2022 1339   LYMPHSABS 1,106 07/14/2022 1339   MONOABS 0.5 08/05/2021 0119   EOSABS 50 07/14/2022 1339   BASOSABS 20 07/14/2022 1339    Chest Imaging: 07/30/2021 CTA chest: No PE Left lower lobe pulmonary nodule, largest rounded lesion at 1.7 cm.  Other inflammatory type infiltrates present as well.  Smaller nodule in the subpleural region. The patient's images have been  independently reviewed by me.    09/10/2021 CT chest: Left lower lobe nodular opacities slowly resolving decreased in size, consistent with inflammatory infiltrate. The patient's images have been independently reviewed by me.    Pulmonary Functions Testing Results:      No data to display          FeNO:   Pathology:   Echocardiogram:   Heart Catheterization:     Assessment & Plan:     ICD-10-CM   1. Lung nodule  R91.1     2. Aneurysm of ascending aorta without rupture (HCC)  I71.21     3. Abnormal CT scan, lung  R91.8       Discussion:  This is a 67 year old gentleman seen for a lung nodule follow-up.  He has heart failure on Entresto.  He had follow-up CT imaging showing near complete resolution of the previous nodule that was seen.  Plan: No additional follow-up needed at this time. I will place referral to cardiothoracic surgery for annual CT imaging of his aneurysm of his ascending aorta. This will also image his lungs and pick up any additional nodules in the future. He will need annual screening for this as well.     Current Outpatient Medications:    cetirizine (ZYRTEC) 10 MG tablet, Take 10 mg by mouth in the morning., Disp: , Rfl:    amLODipine (NORVASC) 10 MG tablet, Take 1 tablet (10 mg total) by mouth daily., Disp: 30 tablet, Rfl: 0   ascorbic acid (VITAMIN C) 500 MG tablet, Take 500 mg by mouth every morning. , Disp: , Rfl:    aspirin 81 MG chewable tablet, Chew 1 tablet (81 mg total) by mouth daily., Disp: 30 tablet, Rfl: 0   atorvastatin (LIPITOR) 80 MG tablet, Take 80 mg by mouth at bedtime., Disp: , Rfl:    B Complex Vitamins (B COMPLEX 100 PO), Take 1 tablet by mouth every morning. , Disp: , Rfl:    carvedilol (COREG) 6.25 MG tablet, Take 1 tablet (6.25 mg total) by mouth 2 (two) times daily with a meal., Disp: 60 tablet, Rfl: 0   cyanocobalamin (VITAMIN B12) 1000 MCG tablet, Take 2,000 mcg by mouth daily., Disp: , Rfl:    dapagliflozin propanediol (FARXIGA) 10 MG TABS tablet, Take 1 tablet (10 mg total) by mouth daily before breakfast., Disp: 90 tablet, Rfl: 3   levofloxacin (LEVAQUIN) 750 MG tablet, Take one hour prior to prostate biopsy procedure., Disp: 1 tablet, Rfl: 0   metFORMIN (GLUCOPHAGE) 1000 MG tablet,  Take 1,000 mg by mouth 2 (two) times daily., Disp: , Rfl:    pantoprazole (PROTONIX) 40 MG tablet, Take 40 mg by mouth daily., Disp: , Rfl:    potassium chloride SA (KLOR-CON M20) 20 MEQ tablet, Take 1 tablet (20 mEq total) by mouth daily., Disp: 90 tablet, Rfl: 3   sacubitril-valsartan (ENTRESTO) 24-26 MG, Take 1 tablet by mouth 2 (two) times daily., Disp: 180 tablet, Rfl: 3   spironolactone (ALDACTONE) 25 MG tablet, TAKE 1/2 TABLET(12.5 MG) BY MOUTH DAILY, Disp: 15 tablet, Rfl: 6   tamsulosin (FLOMAX) 0.4 MG CAPS capsule, Take 1 capsule (0.4 mg total) by mouth daily., Disp: 90 capsule, Rfl: 3   Garner Nash, DO [facility] 07/16/2022 2:23 PM

## 2022-07-17 ENCOUNTER — Other Ambulatory Visit: Payer: Self-pay

## 2022-07-17 DIAGNOSIS — D649 Anemia, unspecified: Secondary | ICD-10-CM

## 2022-07-22 ENCOUNTER — Telehealth: Payer: Self-pay | Admitting: Neurology

## 2022-07-22 NOTE — Telephone Encounter (Addendum)
I spoke with Andrew Arnold on this request:   Addendum will have to be placed to most recent office visit for renewal auth to be submitted.

## 2022-07-22 NOTE — Telephone Encounter (Signed)
Ok, will see him again on Feb 13, will address the issue then   MRI was denied by [employer]: Your doctor told us that you have been treated for low back pain. Your doctor ordered an MRI of your lower back. An MRI is a way to take pictures of the inside of your body. This test should be used if it is likely to result in a specific change in your treatment. Such a change might be related to the need for surgery or a procedure. We reviewed the notes we have. The notes do not show that you are going to have surgery or a procedure. Based on the information we have, this test is not medically necessary for you at this time. We used [employer] Benefits Management Clinical Guideline titled Imaging of the Spine to make this decision. You may view this guideline at [email or web address]-[email or web address].   It was case #[id number]

## 2022-07-22 NOTE — Telephone Encounter (Signed)
Please re-file prior authorization for MRI lumbar,  IMPRESSION: 1. No acute displaced fracture or traumatic listhesis of the lumbar spine. 2. Moderate to severe osseous neural foraminal stenosis at the L4-L5 and L5-S1 levels. Limited evaluation due to overlapping osseous structures and overlying soft tissues.

## 2022-07-23 DIAGNOSIS — B351 Tinea unguium: Secondary | ICD-10-CM | POA: Diagnosis not present

## 2022-07-23 NOTE — Telephone Encounter (Signed)
Patient left a voice mail asking for a return to let him know the results of his xray

## 2022-07-23 NOTE — Telephone Encounter (Signed)
Pt is calling. Stated he is following up on MRI results. Pt is requesting a call back from the nurse.

## 2022-07-23 NOTE — Telephone Encounter (Signed)
  X-ray of lumbar spine showed severe degenerative changes, I would like to order MRI of the lumbar spine, but his insurance declined,  He has a follow-up appointment pending February, May move up his follow-up appt to my next available  IMPRESSION: 1. No acute displaced fracture or traumatic listhesis of the lumbar spine. 2. Moderate to severe osseous neural foraminal stenosis at the L4-L5 and L5-S1 levels. Limited evaluation due to overlapping osseous structures and overlying soft tissues.

## 2022-07-23 NOTE — Telephone Encounter (Signed)
Looked at Dr. Krista Blue schedule and nothing is available until after pt current appointment. I added pt to the current wait list for a sooner appointment.

## 2022-07-27 ENCOUNTER — Encounter: Payer: Self-pay | Admitting: Urology

## 2022-07-27 DIAGNOSIS — R972 Elevated prostate specific antigen [PSA]: Secondary | ICD-10-CM | POA: Diagnosis not present

## 2022-07-27 DIAGNOSIS — C61 Malignant neoplasm of prostate: Secondary | ICD-10-CM | POA: Diagnosis not present

## 2022-07-27 DIAGNOSIS — D075 Carcinoma in situ of prostate: Secondary | ICD-10-CM | POA: Diagnosis not present

## 2022-07-29 NOTE — Progress Notes (Signed)
[address]       ,[place] [address]             [phone]    PCP is [name], MD Referring Provider is [name], MD  Chief Complaint: Ascending thoracic aortic aneurysm   HPI: This is a 67 year old with a past medical history of DM, hypertension, hyperlipidemia and OSA.  He was referred to pulmonary medicine about 1 year ago after he was noted to have left lower lobe pulmonary nodules on CT scan obtained at an emergency room visit for chest pain associated with nausea and vomiting.  He had follow-up CT scan by Dr. June Leap this past summer showing partial resolution of the pulmonary nodules but at that time, the radiologist reported also seeing a 4.0 cm ascending aortic aneurysm.  Mr. Stegman was referred to CT surgery for follow-up and surveillance. Mr. Mchaffie denies any recent chest pain.  He has not had any family history of known aortic pathology. Imaging studies were reviewed.  There was actually a 4.0 cm aneurysm present on a CT chest obtained in 2019 that was not discussed in the radiologist's report.  The most recent CT scan obtained on 06/29/2022 shows stable 4.0 cm ascending aortic aneurysm and is essentially unchanged from the study obtained this past summer.  As previously mentioned, the pulmonary nodules are continuing to resolve suggesting the left lower lobe nodularity was related to an inflammatory process.  Mr. Maclay had an echocardiogram in 2022 showing normal biventricular function aortic valve was tricuspid with no insufficiency or stenosis.  No other valvular abnormalities.    Past Medical History:  Diagnosis Date   Allergic rhinitis due to pollen    on allergy shots   Arrhythmia    1980s hospitalized for HTN and ?irregular rhythm but no specific dx but resolved during hospitalization   Diabetes mellitus without complication ([place])    HTN (hypertension)    Hyperlipidemia    Nausea and vomiting 08/01/2021   OSA  (obstructive sleep apnea)    mild obstructive sleep apnea with an AHI of 10.8/h with oxygen saturations as low as 85%.    Past Surgical History:  Procedure Laterality Date   ANTERIOR CERVICAL DECOMP/DISCECTOMY FUSION N/A 06/03/2021   Procedure: Anterior Cervical Decompression Fusion  Cervical three-four;  Surgeon: Vallarie Mare, MD;  Location: [facility];  Service: Neurosurgery;  Laterality: N/A;   colonoscopy  2005   Dr. Gala Romney: normal rectum, sigmoid diverticula in colonic mucosa   COLONOSCOPY N/A 02/15/2014   Procedure: COLONOSCOPY;  Surgeon: Daneil Dolin, MD;  [facility];  Service: Endoscopy;  Laterality: N/A;  2:00   COLONOSCOPY WITH PROPOFOL N/A 05/08/2022   Procedure: COLONOSCOPY WITH PROPOFOL;  Surgeon: Daneil Dolin, MD;  [facility];  Service: Endoscopy;  Laterality: N/A;  11:00 am   RIGHT/LEFT HEART CATH AND CORONARY ANGIOGRAPHY N/A 05/23/2021   Procedure: RIGHT/LEFT HEART CATH AND CORONARY ANGIOGRAPHY;  Surgeon: Jolaine Artist, MD;  Location: [facility];  Service: Cardiovascular;  Laterality: N/A;    Family History  Problem Relation Age of Onset   Cancer Mother    Cirrhosis Father        cirrhosis of the liver; alcohol related   Diabetes Sister    Other Other        neice was on allergy vaccine when young   Colon cancer Neg Hx     Social History Social History  Tobacco Use   Smoking status: Never   Smokeless tobacco: Never  Vaping Use   Vaping Use: Never used  Substance Use Topics   Alcohol use: No   Drug use: No    Current Outpatient Medications  Medication Sig Dispense Refill   amLODipine (NORVASC) 10 MG tablet Take 1 tablet (10 mg total) by mouth daily. 30 tablet 0   ascorbic acid (VITAMIN C) 500 MG tablet Take 500 mg by mouth every morning.      aspirin 81 MG chewable tablet Chew 1 tablet (81 mg total) by mouth daily. 30 tablet 0   atorvastatin (LIPITOR) 80 MG tablet Take 80 mg by mouth at bedtime.     B Complex  Vitamins (B COMPLEX 100 PO) Take 1 tablet by mouth every morning.      carvedilol (COREG) 6.25 MG tablet Take 1 tablet (6.25 mg total) by mouth 2 (two) times daily with a meal. 60 tablet 0   cetirizine (ZYRTEC) 10 MG tablet Take 10 mg by mouth in the morning.     cyanocobalamin (VITAMIN B12) 1000 MCG tablet Take 2,000 mcg by mouth daily.     dapagliflozin propanediol (FARXIGA) 10 MG TABS tablet Take 1 tablet (10 mg total) by mouth daily before breakfast. 90 tablet 3   levofloxacin (LEVAQUIN) 750 MG tablet Take one hour prior to prostate biopsy procedure. 1 tablet 0   metFORMIN (GLUCOPHAGE) 1000 MG tablet Take 1,000 mg by mouth 2 (two) times daily.     pantoprazole (PROTONIX) 40 MG tablet Take 40 mg by mouth daily.     potassium chloride SA (KLOR-CON M20) 20 MEQ tablet Take 1 tablet (20 mEq total) by mouth daily. 90 tablet 3   sacubitril-valsartan (ENTRESTO) 24-26 MG Take 1 tablet by mouth 2 (two) times daily. 180 tablet 3   spironolactone (ALDACTONE) 25 MG tablet TAKE 1/2 TABLET(12.5 MG) BY MOUTH DAILY 15 tablet 6   tamsulosin (FLOMAX) 0.4 MG CAPS capsule Take 1 capsule (0.4 mg total) by mouth daily. 90 capsule 3   No current facility-administered medications for this visit.    Allergies  Allergen Reactions   Penicillins     Pt unsure of reaction     Review of Systems  Chest Pain [  ] Resting SOB '[ ]'$  Exertional SOB [  ]  Pedal Edema [  ] Syncope [  ] Presyncope [  ]  General Review of Systems: [Y] = yes [ N]=no  Consitutional:   nausea '[ ]'$ ;  fever '[ ]'$ ;  Eye : blurred vision '[ ]'$ ; Amaurosis fugax[  ];  Resp: cough '[ ]'$ ;  hemoptysis'[ ]'$ ;  GI: vomiting'[ ]'$ ; melena'[ ]'$ ; hematochezia '[]'$ ;  GU:hematuria'[ ]'$ ; Musculoskeletal: myalgias'[ ]'$ ; joint swelling[  ]; joint erythema'[ ]'$ ; Back pain '[x]'$  Heme/Lymph: anemia'[x]'$ ;  Neuro: TIA'[ ]'$ ;stroke'[ ]'$ ;  seizures'[ ]'$ ;  Endocrine: diabetes'[x]'$ ;   Vital Signs: Heart rate 72 Respirations 20 BP 135/76 SpO2 99% on room air  Physical Exam: General: Mr.  Regis is complex 67 year old gentleman no acute distress.  He walks with a cane due to back pain and left lower extremity weakness. HEENT: Unremarkable Neck: No carotid bruits Chest: Symmetrical, breath sounds full, equal, and clear to auscultation. Heart: Regular rate and rhythm, no murmur Abdomen: Obese, benign Extremities: No deformities, all are well-perfused, peripheral edema Neuro: Grossly nonfocal   Diagnostic Tests:  Narrative & Impression  CLINICAL DATA:  Pulmonary nodule.   EXAM: CT CHEST WITHOUT CONTRAST   TECHNIQUE: Multidetector CT imaging of the chest  was performed following the standard protocol without IV contrast.   RADIATION DOSE REDUCTION: This exam was performed according to the departmental dose-optimization program which includes automated exposure control, adjustment of the mA and/or kV according to patient size and/or use of iterative reconstruction technique.   COMPARISON:  12/11/2021   FINDINGS: Cardiovascular: The heart size is normal. No substantial pericardial effusion. Coronary artery calcification is evident. Mild atherosclerotic calcification is noted in the wall of the thoracic aorta. Ascending thoracic aorta measures 4 cm diameter.   Mediastinum/Nodes: No mediastinal lymphadenopathy. No evidence for gross hilar lymphadenopathy although assessment is limited by the lack of intravenous contrast on the current study. The esophagus has normal imaging features. Tiny hiatal hernia evident. There is no axillary lymphadenopathy.   Lungs/Pleura: Posterior left lower lobe pulmonary nodule continues to decrease in size in the interval measuring 4 mm today (image 116/4) compared to 7 mm on the study from 12/11/2021. No new suspicious pulmonary nodule or mass. No focal airspace consolidation. No pleural effusion.   Upper Abdomen: Unremarkable.   Musculoskeletal: No worrisome lytic or sclerotic osseous abnormality.   IMPRESSION: 1. Continued  decrease in size of the posterior left lower lobe pulmonary nodule, now measuring 4 mm compared to 7 mm on the study from 12/11/2021. Findings compatible with resolving infectious/inflammatory etiology. No new suspicious pulmonary nodule or mass. 2. 4 cm ascending thoracic aortic aneurysm. Recommend annual imaging followup by CTA or MRA. This recommendation follows 2010 ACCF/AHA/AATS/ACR/ASA/SCA/SCAI/SIR/STS/SVM Guidelines for the Diagnosis and Management of Patients with Thoracic Aortic Disease. Circulation. 2010; 121: E366-Q947. Aortic aneurysm NOS (ICD10-I71.9) 3. Tiny hiatal hernia. 4.  Aortic Atherosclerosis (ICD10-I70.0).     Electronically Signed   By: Misty Stanley M.D.   On: 06/29/2022 08:49    Impression and Plan: Risk Modification in those with ascending thoracic aortic aneurysm:  Continue good control of blood pressure (prefer SBP 130/80 or less)-on Coreg, Amlodipine, Entresto  2. Avoid fluoroquinolone antibiotics (I.e Ciprofloxacin, Avelox, Levofloxacin, Ofloxacin)  3.  Use of statin (to decrease cardiovascular risk)  4.  Exercise and activity limitations is individualized, but in general, contact sports are to be  avoided and one should avoid heavy lifting (defined as half of ideal body weight) and exercises involving sustained Valsalva maneuver.  5.  Follow-up in 1 year with CTA chest   Enid Cutter, PA-C [facility] [phone]

## 2022-07-29 NOTE — Telephone Encounter (Signed)
Pt would like a call from the nurse to discuss x-ray results.

## 2022-07-30 ENCOUNTER — Encounter: Payer: Self-pay | Admitting: Physician Assistant

## 2022-07-30 ENCOUNTER — Other Ambulatory Visit: Payer: Self-pay | Admitting: Urology

## 2022-07-30 ENCOUNTER — Institutional Professional Consult (permissible substitution): Payer: BC Managed Care – PPO | Admitting: Physician Assistant

## 2022-07-30 VITALS — BP 135/76 | HR 72 | Resp 20 | Ht 69.0 in | Wt 235.0 lb

## 2022-07-30 DIAGNOSIS — C61 Malignant neoplasm of prostate: Secondary | ICD-10-CM

## 2022-07-30 DIAGNOSIS — I10 Essential (primary) hypertension: Secondary | ICD-10-CM | POA: Diagnosis not present

## 2022-07-30 DIAGNOSIS — I7121 Aneurysm of the ascending aorta, without rupture: Secondary | ICD-10-CM | POA: Diagnosis not present

## 2022-07-30 DIAGNOSIS — E785 Hyperlipidemia, unspecified: Secondary | ICD-10-CM | POA: Diagnosis not present

## 2022-07-30 NOTE — Patient Instructions (Addendum)
Continue good control of blood pressure (prefer SBP 130/80 or less)  2. Avoid fluoroquinolone antibiotics (I.e Ciprofloxacin, Avelox, Levofloxacin, Ofloxacin)  3.  Continue use of statin (atorvastatin)  4.  Exercise and activity limitations are individualized, but in general, contact sports are to be  avoided and one should avoid heavy lifting (defined as half of ideal body weight) and exercises involving sustained Valsalva maneuver.  5. Avoid tobacco products.   6. Follow up 1 year with repeat CTA chest.

## 2022-08-05 ENCOUNTER — Other Ambulatory Visit (HOSPITAL_COMMUNITY): Payer: Self-pay | Admitting: Family Medicine

## 2022-08-10 ENCOUNTER — Telehealth: Payer: BC Managed Care – PPO | Admitting: Cardiology

## 2022-08-13 ENCOUNTER — Other Ambulatory Visit: Payer: Self-pay

## 2022-08-13 DIAGNOSIS — C61 Malignant neoplasm of prostate: Secondary | ICD-10-CM

## 2022-08-18 ENCOUNTER — Other Ambulatory Visit (HOSPITAL_COMMUNITY): Payer: Self-pay | Admitting: Cardiology

## 2022-08-18 MED ORDER — ENTRESTO 24-26 MG PO TABS: 1.0000 | ORAL_TABLET | Freq: Two times a day (BID) | ORAL | 1 refills | Status: DC

## 2022-08-21 DIAGNOSIS — M545 Low back pain, unspecified: Secondary | ICD-10-CM | POA: Diagnosis not present

## 2022-08-21 DIAGNOSIS — C61 Malignant neoplasm of prostate: Secondary | ICD-10-CM | POA: Diagnosis not present

## 2022-08-21 DIAGNOSIS — E559 Vitamin D deficiency, unspecified: Secondary | ICD-10-CM | POA: Diagnosis not present

## 2022-08-21 DIAGNOSIS — M5442 Lumbago with sciatica, left side: Secondary | ICD-10-CM | POA: Diagnosis not present

## 2022-08-25 ENCOUNTER — Encounter: Payer: Self-pay | Admitting: Neurology

## 2022-08-25 ENCOUNTER — Ambulatory Visit (INDEPENDENT_AMBULATORY_CARE_PROVIDER_SITE_OTHER): Payer: BC Managed Care – PPO | Admitting: Neurology

## 2022-08-25 VITALS — BP 122/78 | HR 74 | Ht 69.0 in | Wt 235.0 lb

## 2022-08-25 DIAGNOSIS — R269 Unspecified abnormalities of gait and mobility: Secondary | ICD-10-CM | POA: Diagnosis not present

## 2022-08-25 DIAGNOSIS — G8929 Other chronic pain: Secondary | ICD-10-CM

## 2022-08-25 DIAGNOSIS — M545 Low back pain, unspecified: Secondary | ICD-10-CM | POA: Diagnosis not present

## 2022-08-25 DIAGNOSIS — R202 Paresthesia of skin: Secondary | ICD-10-CM | POA: Diagnosis not present

## 2022-08-25 MED ORDER — CELECOXIB 100 MG PO CAPS: 100.0000 mg | ORAL_CAPSULE | Freq: Two times a day (BID) | ORAL | 3 refills | Status: DC | PRN

## 2022-08-25 NOTE — Progress Notes (Signed)
ASSESSMENT AND PLAN  Andrew Arnold is a 67 y.o. 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   MEDICAL HISTORY:  ARTEZ Arnold, is a 67 year old male seen in request by PICC surgeon Justice Britain for evaluation of right-sided weakness, his primary care physician is Dr. [name], initial evaluation was on March 10, 2022  I reviewed and summarized the referring note. PMHX. HTN HLD DM-since 2022,  GERD. 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 Claudette Head on June 03, 2021  He reported recovering well, he can move his right arm and walking better, he  lives alone, Hospital admission in January 2023 for generalized weakness, pneumonia versus gastroenteritis, weight loss, severely dehydrated, orthostatic hypotension,  Ultrasound of carotid artery showed no significant large vessel disease  ER presentation April 23 chest pain, CT chest showed patchy groundglass opacity in the right lower lobe right middle lobe likely infection  He complains of right shoulder pain, was seen by orthopedic surgeon Dr. Onnie Graham, MRI of right shoulder from a note also January 30, 2022, severe supraspinatus and infraspinatus tendinosis, 2.0 x 1.9 cm high-grade partial articular surface tear is evident, just medial to the infraspinatus insertion,   During my examination, he was noted to have right arm and leg weakness, before proceeding to further orthopedic intervention, is referred to neurology for evaluation  Patient is a poor historian, complains of gait abnormality, right hand weakness, paresthesia, denies significant neck pain, does have low back pain, more to the left side, also noticed left lateral leg paresthesia, left toe numbness  Update May 22, 2022: Patient return for electrodiagnostic study today, which showed evidence of moderate axonal sensorimotor polyneuropathy, also evidence of 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  Continue complains of left lower extremity paresthesia, gave out underneath him, intermittent low back pain  MRI of lumbar is pending, significant right shoulder pain,  UPDATE Aug 25 2022: He continued complaints of significant low back pain, gait abnormalities, low back pain mainly stay on the left side radiating to left anterior lateral thigh and, anterior leg to top of left foot involving first toe, in the left L5 distribution  He has to ambulate with a cane, continue have bilateral upper extremity paresthesia, wears sleevs  He has tried Tylenol and NSAIDs  with limited help  X-ray of lumbar in July 11, 2022 showed no  acute abnormality moderate to severe osseous neuroforaminal stenosis at L4-5, L5-S1 level    PHYSICAL EXAM: Vitals:   08/25/22 1506  Weight: 235 lb (106.6 kg)  Height: 5' 9"$  (1.753 m)      PHYSICAL EXAMNIATION:  Gen: NAD, conversant, well nourised, well groomed                     Cardiovascular: Regular rate rhythm, no peripheral edema, warm, nontender. Eyes: Conjunctivae clear without exudates or hemorrhage Neck: Supple, no carotid bruits. Pulmonary: Clear to auscultation bilaterally   NEUROLOGICAL EXAM:  MENTAL STATUS: Speech/cognition: Awake, alert, oriented to history taking and casual conversation CRANIAL NERVES: CN II: Visual fields are full to confrontation. Pupils are round equal and briskly reactive to light. CN III, IV, VI: extraocular movement are normal. No ptosis. CN V: Facial sensation is intact to light touch CN VII: Face is symmetric with normal eye closure  CN VIII: Hearing is normal to causal conversation. CN IX, X: Phonation is normal. CN XI: Head turning and shoulder shrug are intact CN XII: Narrow oropharyngeal space  MOTOR: mild  right shoulder abduction, external rotation, elbow flexion, extension weakness, which is also limited by his right shoulder pain, no significant right upper extremity distal muscle weakness  No significant lower extremity weakness  REFLEXES: Reflexes are 2+ and symmetric at the biceps, triceps, 3R/2+Lknees, and absent ankles. Plantar responses are flexor.  SENSORY: Intact to light touch   COORDINATION: There is no trunk or limb dysmetria noted.  GAIT/STANCE: He needs push-up to get up from seated position,  antalgic  REVIEW OF SYSTEMS:  Full 14 system review of systems performed and notable only for as above All other review of systems were negative.   ALLERGIES: Allergies  Allergen Reactions   Penicillins     Pt unsure of reaction     HOME MEDICATIONS: Current Outpatient Medications  Medication Sig  Dispense Refill   amLODipine (NORVASC) 10 MG tablet Take 1 tablet (10 mg total) by mouth daily. 30 tablet 0   ascorbic acid (VITAMIN C) 500 MG tablet Take 500 mg by mouth every morning.      aspirin 81 MG chewable tablet Chew 1 tablet (81 mg total) by mouth daily. 30 tablet 0   atorvastatin (LIPITOR) 80 MG tablet Take 80 mg by mouth at bedtime.     B Complex Vitamins (B COMPLEX 100 PO) Take 1 tablet by mouth every morning.      carvedilol (COREG) 6.25 MG tablet Take 1 tablet (6.25 mg total) by mouth 2 (two) times daily with a meal. 60 tablet 0   cetirizine (ZYRTEC) 10 MG tablet Take 10 mg by mouth in the morning.     cyanocobalamin (VITAMIN B12) 1000 MCG tablet Take 2,000 mcg by mouth daily.     dapagliflozin propanediol (FARXIGA) 10 MG TABS tablet Take 1 tablet (10 mg total) by mouth daily before breakfast. 90 tablet 3   metFORMIN (GLUCOPHAGE) 1000 MG tablet Take 1,000 mg by mouth 2 (two) times daily.     pantoprazole (PROTONIX) 40 MG tablet Take 40 mg by mouth daily.     potassium chloride SA (KLOR-CON M) 20 MEQ tablet TAKE 1 TABLET BY MOUTH EVERY DAY 90 tablet 3   sacubitril-valsartan (ENTRESTO) 24-26 MG Take 1 tablet by mouth 2 (two) times daily. Please call for office visit [phone] 60 tablet 1   spironolactone (ALDACTONE) 25  MG tablet TAKE 1/2 TABLET(12.5 MG) BY MOUTH DAILY 15 tablet 6   tamsulosin (FLOMAX) 0.4 MG CAPS capsule Take 1 capsule (0.4 mg total) by mouth daily. 90 capsule 3   ferrous sulfate 325 (65 FE) MG tablet Take 325 mg by mouth daily with breakfast.     No current facility-administered medications for this visit.    PAST MEDICAL HISTORY: Past Medical History:  Diagnosis Date   Allergic rhinitis due to pollen    on allergy shots   Arrhythmia    1980s hospitalized for HTN and ?irregular rhythm but no specific dx but resolved during hospitalization   Diabetes mellitus without complication ([place])    HTN (hypertension)    Hyperlipidemia    Nausea and vomiting  08/01/2021   OSA (obstructive sleep apnea)    mild obstructive sleep apnea with an AHI of 10.8/h with oxygen saturations as low as 85%.    PAST SURGICAL HISTORY: Past Surgical History:  Procedure Laterality Date   ANTERIOR CERVICAL DECOMP/DISCECTOMY FUSION N/A 06/03/2021   Procedure: Anterior Cervical Decompression Fusion  Cervical three-four;  Surgeon: Vallarie Mare, MD;  Location: [facility];  Service: Neurosurgery;  Laterality: N/A;   colonoscopy  2005   Dr. Gala Romney: normal rectum, sigmoid diverticula in colonic mucosa   COLONOSCOPY N/A 02/15/2014   Procedure: COLONOSCOPY;  Surgeon: Daneil Dolin, MD;  [facility];  Service: Endoscopy;  Laterality: N/A;  2:00   COLONOSCOPY WITH PROPOFOL N/A 05/08/2022   Procedure: COLONOSCOPY WITH PROPOFOL;  Surgeon: Daneil Dolin, MD;  [facility];  Service: Endoscopy;  Laterality: N/A;  11:00 am   RIGHT/LEFT HEART CATH AND CORONARY ANGIOGRAPHY N/A 05/23/2021   Procedure: RIGHT/LEFT HEART CATH AND CORONARY ANGIOGRAPHY;  Surgeon: Jolaine Artist, MD;  Location: [facility];  Service: Cardiovascular;  Laterality: N/A;    FAMILY HISTORY: Family History  Problem Relation Age of Onset   Cancer Mother    Cirrhosis Father        cirrhosis of the liver; alcohol related   Diabetes Sister    Other Other        neice was on allergy vaccine when young   Colon cancer Neg Hx     SOCIAL HISTORY: Social History   Socioeconomic History   Marital status: Single    Spouse name: Not on file   Number of children: Not on file   Years of education: Not on file   Highest education level: Not on file  Occupational History   Occupation: school counselor  Tobacco Use   Smoking status: Never   Smokeless tobacco: Never  Vaping Use   Vaping Use: Never used  Substance and Sexual Activity   Alcohol use: No   Drug use: No   Sexual activity: Yes  Other Topics Concern   Not on file  Social History [place], [place]   Lives in [place] Determinants of Health   Financial Resource Strain: Not on file  Food Insecurity: Not on file  Transportation Needs: Not on file  Physical Activity: Not on file  Stress: Not on file  Social Connections: Not on file  Intimate Partner Violence: Not on file      Marcial Pacas, M.D. Ph.D.  [facility] [address], [place], [place] [address] Ph: [phone] Fax: [phone]  CC:  [name], MD [address]  [place],  [place] [address]  [name], MD    Total time spent reviewing the chart, obtaining history, examined patient, ordering tests, documentation, consultations and family, care coordination was 30 minutes

## 2022-08-26 ENCOUNTER — Telehealth: Payer: Self-pay | Admitting: Neurology

## 2022-08-26 NOTE — Telephone Encounter (Signed)
[employer] NPR via [employer] [employer] sent to [facility] to schedule [phone]

## 2022-08-27 ENCOUNTER — Telehealth: Payer: Self-pay | Admitting: Neurology

## 2022-08-27 NOTE — Telephone Encounter (Signed)
Pt is calling stating he needs for a nurse to follow-up on his medication. Stated he went to [facility] and was told they have reach put to office several times with no response. Pt didn't know the name of the medication.

## 2022-09-02 ENCOUNTER — Ambulatory Visit (HOSPITAL_COMMUNITY)
Admission: RE | Admit: 2022-09-02 | Discharge: 2022-09-02 | Disposition: A | Discharge status: Home / Self Care | Payer: BC Managed Care – PPO | Source: Ambulatory Visit | Attending: Urology | Admitting: Urology

## 2022-09-02 ENCOUNTER — Encounter (HOSPITAL_COMMUNITY): Payer: Self-pay

## 2022-09-02 ENCOUNTER — Encounter (HOSPITAL_COMMUNITY)
Admission: RE | Admit: 2022-09-02 | Discharge: 2022-09-02 | Disposition: A | Discharge status: Home / Self Care | Payer: BC Managed Care – PPO | Source: Ambulatory Visit | Attending: Urology | Admitting: Urology

## 2022-09-02 DIAGNOSIS — C61 Malignant neoplasm of prostate: Secondary | ICD-10-CM | POA: Diagnosis not present

## 2022-09-02 DIAGNOSIS — M19011 Primary osteoarthritis, right shoulder: Secondary | ICD-10-CM | POA: Diagnosis not present

## 2022-09-02 DIAGNOSIS — M170 Bilateral primary osteoarthritis of knee: Secondary | ICD-10-CM | POA: Diagnosis not present

## 2022-09-02 DIAGNOSIS — M19072 Primary osteoarthritis, left ankle and foot: Secondary | ICD-10-CM | POA: Diagnosis not present

## 2022-09-02 DIAGNOSIS — M19012 Primary osteoarthritis, left shoulder: Secondary | ICD-10-CM | POA: Diagnosis not present

## 2022-09-02 DIAGNOSIS — K5730 Diverticulosis of large intestine without perforation or abscess without bleeding: Secondary | ICD-10-CM | POA: Diagnosis not present

## 2022-09-02 LAB — POCT I-STAT CREATININE: Creatinine, Ser: 1.1 mg/dL (ref 0.61–1.24)

## 2022-09-02 MED ORDER — TECHNETIUM TC 99M MEDRONATE IV KIT
20.0000 | PACK | Freq: Once | INTRAVENOUS | Status: AC | PRN
  Administered 2022-09-02: 20.2 via INTRAVENOUS

## 2022-09-02 MED ORDER — IOHEXOL 300 MG/ML  SOLN
100.0000 mL | Freq: Once | INTRAMUSCULAR | Status: AC | PRN
  Administered 2022-09-02: 100 mL via INTRAVENOUS

## 2022-09-02 MED ORDER — SODIUM CHLORIDE (PF) 0.9 % IJ SOLN
INTRAMUSCULAR | Status: AC
  Filled 2022-09-02: qty 50

## 2022-09-03 ENCOUNTER — Telehealth: Payer: Self-pay

## 2022-09-03 NOTE — Telephone Encounter (Signed)
Patient aware of MD response to CT, he did not have a f/u scheduled. Scheduled pt with Dr. Jeffie Pollock on 02/29 to discuss C.

## 2022-09-03 NOTE — Telephone Encounter (Signed)
-----   Message from Irine Seal, MD sent at 09/02/2022  5:03 PM EST ----- His CT is negative for prostate cancer spread.  I don't see that he has f/u with me to discuss the results.   I don't know if he is scheduled in [place] since he had his biopsy there.   If he is not scheduled there, he needs a consult time with me.  ----- Message ----- From: Audie Box, CMA Sent: 09/02/2022   4:31 PM EST To: Irine Seal, MD  Please review.

## 2022-09-10 ENCOUNTER — Ambulatory Visit: Payer: BC Managed Care – PPO | Admitting: Urology

## 2022-09-10 VITALS — BP 124/72 | HR 66 | Ht 69.0 in | Wt 239.0 lb

## 2022-09-10 DIAGNOSIS — C61 Malignant neoplasm of prostate: Secondary | ICD-10-CM

## 2022-09-10 DIAGNOSIS — R350 Frequency of micturition: Secondary | ICD-10-CM

## 2022-09-10 DIAGNOSIS — N403 Nodular prostate with lower urinary tract symptoms: Secondary | ICD-10-CM

## 2022-09-10 DIAGNOSIS — R972 Elevated prostate specific antigen [PSA]: Secondary | ICD-10-CM | POA: Diagnosis not present

## 2022-09-10 DIAGNOSIS — R351 Nocturia: Secondary | ICD-10-CM | POA: Diagnosis not present

## 2022-09-10 DIAGNOSIS — N3941 Urge incontinence: Secondary | ICD-10-CM

## 2022-09-10 LAB — MICROSCOPIC EXAMINATION
Bacteria, UA: NONE SEEN
RBC, Urine: NONE SEEN /hpf (ref 0–2)

## 2022-09-10 LAB — URINALYSIS, ROUTINE W REFLEX MICROSCOPIC
Bilirubin, UA: NEGATIVE
Ketones, UA: NEGATIVE
Leukocytes,UA: NEGATIVE
Nitrite, UA: NEGATIVE
RBC, UA: NEGATIVE
Specific Gravity, UA: 1.015 (ref 1.005–1.030)
Urobilinogen, Ur: 0.2 mg/dL (ref 0.2–1.0)
pH, UA: 7 (ref 5.0–7.5)

## 2022-09-10 NOTE — Progress Notes (Signed)
Subjective: 1. Prostate cancer ([place])   2. Elevated PSA   3. Nodular prostate with lower urinary tract symptoms   4. Nocturia   5. Urinary frequency   6. Urge incontinence      09/10/22: [name] returns today in f/u from his prostate biopsy and staging.   He had a PSA up to 6.3 with a 14% f/t ratio and then had an MRI that showed a 144m prostate with 2 PIRADS 4 lesions.  His biopsy showed Gleason 7(4+3) in 2 of the standard cores on the right and in the left ROI.  He had additional cores with GG1 bilaterally in the standard cores and GG2 in 2/4 cores from the right ROI.  He had right apical induration on exam and has had a negative CT and bone scan.  He remains on tamsulosin with minimal LUTS.   He is seeing neurology for his back and she is trying to get an MRI approved.  He has back pain and is stiff when he stands.   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   He has ED.   Stage T2a N0 M0 GG3 unfavorable intermediate risk prostate cancer.      ROS:  Review of Systems  Constitutional: Negative.   Respiratory:  Negative for shortness of breath.   Cardiovascular:  Negative for chest pain and leg swelling.  Gastrointestinal: Negative.   Musculoskeletal: Negative.   All other systems reviewed and are negative.   Allergies  Allergen Reactions   Penicillins     Pt unsure of reaction     Past Medical History:  Diagnosis Date   Allergic rhinitis due to pollen    on allergy shots   Arrhythmia    1980s hospitalized for HTN and ?irregular rhythm but no specific dx but resolved during hospitalization   Diabetes mellitus without complication [place]    HTN (hypertension)    Hyperlipidemia    Nausea and vomiting 08/01/2021   OSA (obstructive sleep apnea)    mild obstructive sleep apnea with an AHI of 10.8/h with oxygen saturations as low as 85%.   prostate ca 07/2022    Past Surgical History:  Procedure Laterality Date   ANTERIOR CERVICAL DECOMP/DISCECTOMY FUSION N/A  06/03/2021   Procedure: Anterior Cervical Decompression Fusion  Cervical three-four;  Surgeon: TVallarie Mare MD;  [place] [facility]  Service: Neurosurgery;  Laterality: N/A;   colonoscopy  2005   Dr. RGala Romney normal rectum, sigmoid diverticula in colonic mucosa   COLONOSCOPY N/A 02/15/2014   Procedure: COLONOSCOPY;  Surgeon: RDaneil Dolin MD;  [facility];  Service: Endoscopy;  Laterality: N/A;  2:00   COLONOSCOPY WITH PROPOFOL N/A 05/08/2022   Procedure: COLONOSCOPY WITH PROPOFOL;  Surgeon: RDaneil Dolin MD;  [facility];  Service: Endoscopy;  Laterality: N/A;  11:00 am   RIGHT/LEFT HEART CATH AND CORONARY ANGIOGRAPHY N/A 05/23/2021   Procedure: RIGHT/LEFT HEART CATH AND CORONARY ANGIOGRAPHY;  Surgeon: BJolaine Artist MD;  [facility];  Service: Cardiovascular;  Laterality: N/A;    Social History   Socioeconomic History   Marital status: Single    Spouse name: Not on file   Number of children: Not on file   Years of education: Not on file   Highest education level: Not on file  Occupational History   Occupation: school counselor  Tobacco Use   Smoking status: Never   Smokeless tobacco: Never  Vaping Use   Vaping Use:  Never used  Substance and Sexual Activity   Alcohol use: No   Drug use: No   Sexual activity: Yes  Other Topics Concern   Not on file  Social History Narrative   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[place], [place]   Lives in [place]            Social Determinants of Health   Financial Resource Strain: Not on file  Food Insecurity: Not on file  Transportation Needs: Not on file  Physical Activity: Not on file  Stress: Not on file  Social Connections: Not on file  Intimate Partner Violence: Not on file    Family History  Problem Relation Age of Onset   Cancer Mother    Cirrhosis Father        cirrhosis of the liver; alcohol related   Diabetes Sister    Other Other        neice was on allergy vaccine when young    Colon cancer Neg Hx     Anti-infectives: Anti-infectives (From admission, onward)    None       Current Outpatient Medications  Medication Sig Dispense Refill   amLODipine (NORVASC) 10 MG tablet Take 1 tablet (10 mg total) by mouth daily. 30 tablet 0   ascorbic acid (VITAMIN C) 500 MG tablet Take 500 mg by mouth every morning.      aspirin 81 MG chewable tablet Chew 1 tablet (81 mg total) by mouth daily. 30 tablet 0   atorvastatin (LIPITOR) 80 MG tablet Take 80 mg by mouth at bedtime.     B Complex Vitamins (B COMPLEX 100 PO) Take 1 tablet by mouth every morning.      carvedilol (COREG) 6.25 MG tablet Take 1 tablet (6.25 mg total) by mouth 2 (two) times daily with a meal. 60 tablet 0   celecoxib (CELEBREX) 100 MG capsule Take 1 capsule (100 mg total) by mouth 2 (two) times daily as needed for pain. 60 capsule 3   cetirizine (ZYRTEC) 10 MG tablet Take 10 mg by mouth in the morning.     cyanocobalamin (VITAMIN B12) 1000 MCG tablet Take 2,000 mcg by mouth daily.     dapagliflozin propanediol (FARXIGA) 10 MG TABS tablet Take 1 tablet (10 mg total) by mouth daily before breakfast. 90 tablet 3   metFORMIN (GLUCOPHAGE) 1000 MG tablet Take 1,000 mg by mouth 2 (two) times daily.     pantoprazole (PROTONIX) 40 MG tablet Take 40 mg by mouth daily.     potassium chloride SA (KLOR-CON M) 20 MEQ tablet TAKE 1 TABLET BY MOUTH EVERY DAY 90 tablet 3   sacubitril-valsartan (ENTRESTO) 24-26 MG Take 1 tablet by mouth 2 (two) times daily. Please call for office visit [phone] 60 tablet 1   spironolactone (ALDACTONE) 25 MG tablet TAKE 1/2 TABLET(12.5 MG) BY MOUTH DAILY 15 tablet 6   tamsulosin (FLOMAX) 0.4 MG CAPS capsule Take 1 capsule (0.4 mg total) by mouth daily. 90 capsule 3   No current facility-administered medications for this visit.     Objective: Vital signs in last 24 hours: BP 124/72   Pulse 66   Ht '5\' 9"'$  (1.753 m)   Wt 239 lb (108.4 kg)   BMI 35.29 kg/m   Intake/Output from  previous day: No intake/output data recorded. Intake/Output this shift: '@IOTHISSHIFT'$ @   Physical Exam Vitals reviewed.  Constitutional:      Appearance: Normal appearance.  Neurological:     Mental Status: He is alert.  Lab Results:  Recent Results (from the past 2160 hour(s))  Fe+TIBC+Fer     Status: Abnormal   Collection Time: 07/14/22  1:39 PM  Result Value Ref Range   Iron 42 (L) 50 - 180 mcg/dL   TIBC 296 250 - 425 mcg/dL (calc)   %SAT 14 (L) 20 - 48 % (calc)   Ferritin 107 24 - 380 ng/mL  B12 and Folate Panel     Status: None   Collection Time: 07/14/22  1:39 PM  Result Value Ref Range   Vitamin B-12 768 200 - 1,100 pg/mL   Folate >24.0 ng/mL    Comment:                            Reference Range                            Low:           <3.4                            Borderline:    3.4-5.4                            Normal:        >5.4 .   CBC w/Diff/Platelet     Status: Abnormal   Collection Time: 07/14/22  1:39 PM  Result Value Ref Range   WBC 3.3 (L) 3.8 - 10.8 Thousand/uL   RBC 3.66 (L) 4.20 - 5.80 Million/uL   Hemoglobin 11.9 (L) 13.2 - 17.1 g/dL   HCT 34.9 (L) 38.5 - 50.0 %   MCV 95.4 80.0 - 100.0 fL   MCH 32.5 27.0 - 33.0 pg   MCHC 34.1 32.0 - 36.0 g/dL   RDW 13.7 11.0 - 15.0 %   Platelets 228 140 - 400 Thousand/uL   MPV 10.7 7.5 - 12.5 fL   Neutro Abs 1,759 1,500 - 7,800 cells/uL   Lymphs Abs 1,106 850 - 3,900 cells/uL   Absolute Monocytes 366 200 - 950 cells/uL   Eosinophils Absolute 50 15 - 500 cells/uL   Basophils Absolute 20 0 - 200 cells/uL   Neutrophils Relative % 53.3 %   Total Lymphocyte 33.5 %   Monocytes Relative 11.1 %   Eosinophils Relative 1.5 %   Basophils Relative 0.6 %  I-STAT creatinine     Status: None   Collection Time: 09/02/22  9:13 AM  Result Value Ref Range   Creatinine, Ser 1.10 0.61 - 1.24 mg/dL  Urinalysis, Routine w reflex microscopic     Status: Abnormal   Collection Time: 09/10/22 10:44 AM  Result Value  Ref Range   Specific Gravity, UA 1.015 1.005 - 1.030   pH, UA 7.0 5.0 - 7.5   Color, UA Yellow Yellow   Appearance Ur Clear Clear   Leukocytes,UA Negative Negative   Protein,UA 1+ (A) Negative/Trace   Glucose, UA 3+ (A) Negative   Ketones, UA Negative Negative   RBC, UA Negative Negative   Bilirubin, UA Negative Negative   Urobilinogen, Ur 0.2 0.2 - 1.0 mg/dL   Nitrite, UA Negative Negative   Microscopic Examination See below:     Comment: Microscopic was indicated and was performed.  Microscopic Examination     Status: Abnormal   Collection Time: 09/10/22 10:44 AM  Urine  Result Value Ref Range   WBC, UA 0-5 0 - 5 /hpf   RBC, Urine None seen 0 - 2 /hpf   Epithelial Cells (non renal) 0-10 0 - 10 /hpf   Casts Present (A) None seen /lpf   Cast Type Hyaline casts N/A   Bacteria, UA None seen None seen/Few      BMET No results for input(s): "NA", "K", "CL", "CO2", "GLUCOSE", "BUN", "CREATININE", "CALCIUM" in the last 72 hours.  PT/INR No results for input(s): "LABPROT", "INR" in the last 72 hours. ABG No results for input(s): "PHART", "HCO3" in the last 72 hours.  Invalid input(s): "PCO2", "PO2"  Studies/Results: No results found. PVR 69m  NM Bone Scan Whole Body  Result Date: 09/04/2022 CLINICAL DATA:  Intermediate risk.  Staging. EXAM: NUCLEAR MEDICINE WHOLE BODY BONE SCAN TECHNIQUE: Whole body anterior and posterior images were obtained approximately 3 hours after intravenous injection of radiopharmaceutical. RADIOPHARMACEUTICALS:  20.2 mCi Technetium-939mDP IV COMPARISON:  CT of the abdomen and pelvis September 02, 2022 FINDINGS: Mild scattered uptake in the cervical, thoracic, and lumbar spine is most likely degenerative. AC joint degenerative changes, left greater than right. Degenerative changes at the left first MTP joint. Degenerative changes in the knees. Uptake in the maxilla is likely odontogenic. No other bony or soft tissue abnormalities identified. IMPRESSION:  1. No scintigraphic evidence of bony metastatic disease. 2. Degenerative changes as above. 3. No other abnormalities. Electronically Signed   By: DaDorise BullionII M.D.   On: 09/04/2022 08:24   CT Abdomen Pelvis W Contrast  Result Date: 09/02/2022 CLINICAL DATA:  Newly diagnosed, intermediate risk prostate carcinoma. Staging. * Tracking Code: BO * EXAM: CT ABDOMEN AND PELVIS WITH CONTRAST TECHNIQUE: Multidetector CT imaging of the abdomen and pelvis was performed using the standard protocol following bolus administration of intravenous contrast. RADIATION DOSE REDUCTION: This exam was performed according to the departmental dose-optimization program which includes automated exposure control, adjustment of the mA and/or kV according to patient size and/or use of iterative reconstruction technique. CONTRAST:  10078mMNIPAQUE IOHEXOL 300 MG/ML  SOLN COMPARISON:  Noncontrast CT on 08/02/2021 FINDINGS: Lower Chest: No acute findings. Hepatobiliary: No hepatic masses identified. Gallbladder is unremarkable. No evidence of biliary ductal dilatation. Pancreas:  No mass or inflammatory changes. Spleen: Within normal limits in size and appearance. Adrenals/Urinary Tract: No suspicious masses identified. No evidence of ureteral calculi or hydronephrosis. Stomach/Bowel: No evidence of obstruction, inflammatory process or abnormal fluid collections. Normal appendix visualized. Diverticulosis is seen mainly involving the sigmoid colon, however there is no evidence of diverticulitis. Vascular/Lymphatic: No pathologically enlarged lymph nodes. No acute vascular findings. Aortic atherosclerotic calcification incidentally noted. Reproductive: Stable moderate to markedly enlarged prostate gland, with median lobe hypertrophy indenting the bladder base. Other:  None. Musculoskeletal:  No suspicious bone lesions identified. IMPRESSION: Stable enlarged prostate gland. No evidence of metastatic disease within the abdomen or pelvis.  Colonic diverticulosis, without radiographic evidence of diverticulitis. Aortic Atherosclerosis (ICD10-I70.0). Electronically Signed   By: JohMarlaine HindD.   On: 09/02/2022 16:07   DG Lumbar Spine 2-3 Views  Result Date: 07/11/2022 CLINICAL DATA:  low back pain EXAM: LUMBAR SPINE - 2-3 VIEW COMPARISON:  X-ray lumbar spine 03/07/2021. CT abdomen pelvis 08/02/2021 FINDINGS: Limited evaluation due to overlapping osseous structures and overlying soft tissues. Non-rib-bearing 5 lumbar vertebral bodies. Similar-appearing multilevel moderate to severe degenerative changes of the spine. Multilevel intervertebral disc space narrowing most prominent at the L4-L5 and L5-S1 levels. Moderate to severe  osseous neural foraminal stenosis at the L4-L5 and L5-S1 levels. There is no evidence of lumbar spine fracture. Alignment is normal. IMPRESSION: 1. No acute displaced fracture or traumatic listhesis of the lumbar spine. 2. Moderate to severe osseous neural foraminal stenosis at the L4-L5 and L5-S1 levels. Limited evaluation due to overlapping osseous structures and overlying soft tissues. Electronically Signed   By: Iven Finn M.D.   On: 07/11/2022 00:06   MR PROSTATE W WO CONTRAST  Result Date: 06/30/2022 CLINICAL DATA:  A 67 year old male presents for evaluation of elevated PSA, suspected prostate cancer. EXAM: MR PROSTATE WITHOUT AND WITH CONTRAST TECHNIQUE: Multiplanar multisequence MRI images were obtained of the pelvis centered about the prostate. Pre and post contrast images were obtained. CONTRAST:  10 mL Vueway COMPARISON:  None available FINDINGS: Prostate: Prostatomegaly. Heterogeneous transitional zone predominantly low signal throughout the transitional zone with changes of BPH. Large hypertrophied median lobe extends into the bladder. Areas of linear and wedge-shaped T2 hypointensity in the peripheral zone. Peripheral zone: LEFT posterolateral peripheral zone of the prostate base (image 21/6) 7 mm area  of restricted diffusion corresponding decreased T2 signal. PIRADS category 4. This also shows hyperenhancement on postcontrast imaging. This area also shows differential contrast kinetics and is seen on delayed phase imaging with less enhancement than the surrounding prostate, shown to have early enhancement on multiphase imaging. Transitional zone: Vague area of more pronounced low T2 signal in the RIGHT posterolateral mid group gland transitional zone displays restricted diffusion. (Image 19/8 , image 20/6 and image 20/7) PIRADS category numeral 4 Volume: 106 cc as segmented. Note that the most inferior aspect of the gland is excluded from high-resolution images, included on the last image of the diffusion without signs of high-risk lesion. Transcapsular spread:  Absent Seminal vesicle involvement: Absent Neurovascular bundle involvement: Absent Pelvic adenopathy: Absent Bone metastasis: Absent Other findings: None IMPRESSION: 1. PIRADS category 4 lesion in the LEFT posterolateral peripheral zone at the prostate base. 2. PIRADS category 4 lesion in the RIGHT posterolateral mid-gland transitional zone. 3. Marked prostatomegaly with changes of BPH and sequela of prior prostatitis. Electronically Signed   By: Zetta Bills M.D.   On: 06/30/2022 09:02   CT CHEST WO CONTRAST  Result Date: 06/29/2022 CLINICAL DATA:  Pulmonary nodule. EXAM: CT CHEST WITHOUT CONTRAST TECHNIQUE: Multidetector CT imaging of the chest was performed following the standard protocol without IV contrast. RADIATION DOSE REDUCTION: This exam was performed according to the departmental dose-optimization program which includes automated exposure control, adjustment of the mA and/or kV according to patient size and/or use of iterative reconstruction technique. COMPARISON:  12/11/2021 FINDINGS: Cardiovascular: The heart size is normal. No substantial pericardial effusion. Coronary artery calcification is evident. Mild atherosclerotic  calcification is noted in the wall of the thoracic aorta. Ascending thoracic aorta measures 4 cm diameter. Mediastinum/Nodes: No mediastinal lymphadenopathy. No evidence for gross hilar lymphadenopathy although assessment is limited by the lack of intravenous contrast on the current study. The esophagus has normal imaging features. Tiny hiatal hernia evident. There is no axillary lymphadenopathy. Lungs/Pleura: Posterior left lower lobe pulmonary nodule continues to decrease in size in the interval measuring 4 mm today (image 116/4) compared to 7 mm on the study from 12/11/2021. No new suspicious pulmonary nodule or mass. No focal airspace consolidation. No pleural effusion. Upper Abdomen: Unremarkable. Musculoskeletal: No worrisome lytic or sclerotic osseous abnormality. IMPRESSION: 1. Continued decrease in size of the posterior left lower lobe pulmonary nodule, now measuring 4 mm compared to 7 mm  on the study from 12/11/2021. Findings compatible with resolving infectious/inflammatory etiology. No new suspicious pulmonary nodule or mass. 2. 4 cm ascending thoracic aortic aneurysm. Recommend annual imaging followup by CTA or MRA. This recommendation follows 2010 ACCF/AHA/AATS/ACR/ASA/SCA/SCAI/SIR/STS/SVM Guidelines for the Diagnosis and Management of Patients with Thoracic Aortic Disease. Circulation. 2010; [id number]. Aortic aneurysm NOS (ICD10-I71.9) 3. Tiny hiatal hernia. 4.  Aortic Atherosclerosis (ICD10-I70.0). Electronically Signed   By: Misty Stanley M.D.   On: 06/29/2022 08:49     Assessment/Plan: Prostate cancer.  Stage T2a N0 M0 GG3 unfavorable intermediate risk prostate cancer.  I discussed his options.  He is not a routine candidate for surveillance with his multifocal GG3 disease.   His prostate is too large for brachytherapy, Cryo and HIFU.   I reviewed the risks and benefits for RALP which would be helpful with his voiding symptoms.  I reviewed the risks and benefits of EXRT as well as for ST  ADT which would be standard of care for his disease.   I discussed the use of SpaceOAR and fiducial markers and there associated risks as well.    I will send handouts with details of the options and the associated risks and I will get him set up to be seen in the [facility] to help further educate him about his options.  BPH with BOO and OAB wet.  He is doing well on the tamsulosin with near resolution of his symptoms and he will continue that.  Med refilled.   No orders of the defined types were placed in this encounter.      Orders Placed This Encounter  Procedures   Microscopic Examination   Urinalysis, Routine w reflex microscopic   Ambulatory referral to Urology    Referral Priority:   Routine    Referral Type:   Consultation    Referral Reason:   Specialty Services Required    Requested Specialty:   Urology    Number of Visits Requested:   1     Return for He needs to be seen in the prostate [facility] in [place].    CC: Dr. [name].      Irine Seal  Patient ID: Andrew Arnold, male   DOB: [birth year], 67 y.o.   MRN: [id number]

## 2022-09-11 ENCOUNTER — Encounter: Payer: Self-pay | Admitting: Urology

## 2022-09-12 ENCOUNTER — Other Ambulatory Visit (HOSPITAL_COMMUNITY): Payer: Self-pay | Admitting: Family Medicine

## 2022-09-14 DIAGNOSIS — I10 Essential (primary) hypertension: Secondary | ICD-10-CM | POA: Diagnosis not present

## 2022-09-14 DIAGNOSIS — E1165 Type 2 diabetes mellitus with hyperglycemia: Secondary | ICD-10-CM | POA: Diagnosis not present

## 2022-09-14 DIAGNOSIS — E559 Vitamin D deficiency, unspecified: Secondary | ICD-10-CM | POA: Diagnosis not present

## 2022-09-14 DIAGNOSIS — E782 Mixed hyperlipidemia: Secondary | ICD-10-CM | POA: Diagnosis not present

## 2022-09-22 ENCOUNTER — Telehealth: Payer: Self-pay

## 2022-09-22 NOTE — Telephone Encounter (Signed)
    1. I confirmed with the patient he is aware of his referral to the clinic 4/12, arriving @ 830 am   2. I discussed the format of the clinic and the physicians he will be seeing that day.   3. I discussed where the clinic is located and how to contact me.   4. I confirmed his address and informed him I would be mailing a packet of information and forms to be completed. I asked him to bring them with him the day of his appointment.    He voiced understanding of the above. I asked him to call me if he has any questions or concerns regarding his appointments or the forms he needs to complete.

## 2022-09-28 ENCOUNTER — Ambulatory Visit: Payer: BC Managed Care – PPO | Attending: Cardiology | Admitting: Cardiology

## 2022-09-28 DIAGNOSIS — G4733 Obstructive sleep apnea (adult) (pediatric): Secondary | ICD-10-CM

## 2022-09-28 DIAGNOSIS — I10 Essential (primary) hypertension: Secondary | ICD-10-CM

## 2022-09-28 DIAGNOSIS — R0683 Snoring: Secondary | ICD-10-CM

## 2022-09-28 DIAGNOSIS — I493 Ventricular premature depolarization: Secondary | ICD-10-CM | POA: Insufficient documentation

## 2022-09-28 DIAGNOSIS — G4719 Other hypersomnia: Secondary | ICD-10-CM

## 2022-09-28 DIAGNOSIS — I2510 Atherosclerotic heart disease of native coronary artery without angina pectoris: Secondary | ICD-10-CM | POA: Diagnosis not present

## 2022-09-29 NOTE — Procedures (Addendum)
Erroneous encounter

## 2022-09-30 NOTE — Procedures (Signed)
   Patient Name: Andrew Arnold, Andrew Arnold Date: 09/28/2022 Gender: Male D.O.B: [birth year] Age (years): 58 Referring Provider: Fransico Him MD, ABSM Height (inches): 69 Interpreting Physician: Fransico Him MD, ABSM Weight (lbs): 239 RPSGT: Rosebud Poles BMI: 35 MRN: [id number]  CLINICAL INFORMATION Sleep Study Type: NPSG  Indication for sleep study: N/A  Epworth Sleepiness Score: 3  SLEEP STUDY TECHNIQUE As per the AASM Manual for the Scoring of Sleep and Associated Events v2.3 (April 2016) with a hypopnea requiring 4% desaturations.  The channels recorded and monitored were frontal, central and occipital EEG, electrooculogram (EOG), submentalis EMG (chin), nasal and oral airflow, thoracic and abdominal wall motion, anterior tibialis EMG, snore microphone, electrocardiogram, and pulse oximetry.  MEDICATIONS Medications self-administered by patient taken the night of the study : N/A  SLEEP ARCHITECTURE The study was initiated at 10:36:33 PM and ended at 5:03:50 AM.  Sleep onset time was 6.8 minutes and the sleep efficiency was 86.2%. The total sleep time was 334 minutes.  Stage REM latency was 177.0 minutes.  The patient spent 5.24% of the night in stage N1 sleep, 65.57% in stage N2 sleep, 21.11% in stage N3 and 8.1% in REM.  Alpha intrusion was absent.  Supine sleep was 23.74%.  RESPIRATORY PARAMETERS The overall apnea/hypopnea index (AHI) was 2.5 per hour. There were 7 total apneas, including 3 obstructive, 4 central and 0 mixed apneas. There were 7 hypopneas and 23 RERAs.  The AHI during Stage REM sleep was 11.1 per hour.  AHI while supine was 7.6 per hour.  The mean oxygen saturation was 97.75%. The minimum SpO2 during sleep was 92.00%.  moderate snoring was noted during this study.  CARDIAC DATA The 2 lead EKG demonstrated sinus rhythm. The mean heart rate was 9.80 beats per minute. Other EKG findings include: PVCs.  LEG MOVEMENT DATA The total PLMS were 0  with a resulting PLMS index of 0.00. Associated arousal with leg movement index was 0.0 .  IMPRESSIONS - No significant obstructive sleep apnea occurred during this study (AHI = 2.5/h). - No significant central sleep apnea occurred during this study (CAI = 0.7/h). - The patient had minimal or no oxygen desaturation during the study (Min O2 = 92.00%) - The patient snored with moderate snoring volume. - EKG findings include PVCs.  DIAGNOSIS - Normal study  RECOMMENDATIONS - Avoid alcohol, sedatives and other CNS depressants that may worsen sleep apnea and disrupt normal sleep architecture. - Sleep hygiene should be reviewed to assess factors that may improve sleep quality. - Weight management and regular exercise should be initiated or continued if appropriate.  [Electronically signed] 10/05/2022 08:53 PM  Fransico Him MD, ABSM Diplomate, American Board of Sleep Medicine

## 2022-10-05 ENCOUNTER — Other Ambulatory Visit: Payer: Self-pay

## 2022-10-05 DIAGNOSIS — D649 Anemia, unspecified: Secondary | ICD-10-CM

## 2022-10-13 ENCOUNTER — Telehealth: Payer: Self-pay | Admitting: *Deleted

## 2022-10-13 NOTE — Telephone Encounter (Signed)
-----   Message from Lauralee Evener, [place] sent at 10/06/2022 10:06 AM EDT -----  ----- Message ----- From: Sueanne Margarita, MD Sent: 10/05/2022   8:55 PM EDT To: Cv Div Sleep Studies  Please let patient know that sleep study showed no significant sleep apnea.

## 2022-10-13 NOTE — Telephone Encounter (Signed)
The patient has been notified of the result and verbalized understanding.  All questions (if any) were answered. Marolyn Hammock, [place] 10/13/2022 11:15 AM    Pt is aware and agreeable to normal results.

## 2022-10-17 ENCOUNTER — Other Ambulatory Visit (HOSPITAL_COMMUNITY): Payer: Self-pay | Admitting: Internal Medicine

## 2022-10-19 DIAGNOSIS — D649 Anemia, unspecified: Secondary | ICD-10-CM | POA: Diagnosis not present

## 2022-10-20 LAB — CBC WITH DIFFERENTIAL/PLATELET
Absolute Monocytes: 333 cells/uL (ref 200–950)
Basophils Absolute: 10 cells/uL (ref 0–200)
Basophils Relative: 0.3 %
Eosinophils Absolute: 90 cells/uL (ref 15–500)
Eosinophils Relative: 2.8 %
HCT: 35.9 % — ABNORMAL LOW (ref 38.5–50.0)
Hemoglobin: 11.8 g/dL — ABNORMAL LOW (ref 13.2–17.1)
Lymphs Abs: 1251 cells/uL (ref 850–3900)
MCH: 31.4 pg (ref 27.0–33.0)
MCHC: 32.9 g/dL (ref 32.0–36.0)
MCV: 95.5 fL (ref 80.0–100.0)
MPV: 10.8 fL (ref 7.5–12.5)
Monocytes Relative: 10.4 %
Neutro Abs: 1517 cells/uL (ref 1500–7800)
Neutrophils Relative %: 47.4 %
Platelets: 208 10*3/uL (ref 140–400)
RBC: 3.76 10*6/uL — ABNORMAL LOW (ref 4.20–5.80)
RDW: 13.5 % (ref 11.0–15.0)
Total Lymphocyte: 39.1 %
WBC: 3.2 10*3/uL — ABNORMAL LOW (ref 3.8–10.8)

## 2022-10-20 LAB — IRON,TIBC AND FERRITIN PANEL
%SAT: 33 % (calc) (ref 20–48)
Ferritin: 101 ng/mL (ref 24–380)
Iron: 94 ug/dL (ref 50–180)
TIBC: 284 mcg/dL (calc) (ref 250–425)

## 2022-10-22 ENCOUNTER — Encounter: Payer: Self-pay | Admitting: Urology

## 2022-10-22 DIAGNOSIS — C61 Malignant neoplasm of prostate: Secondary | ICD-10-CM | POA: Insufficient documentation

## 2022-10-22 NOTE — Progress Notes (Signed)
                               Care Plan Summary  Name: Andrew Arnold DOB: 04/19/1956   Your Medical Team:   Urologist -  Dr. Heloise Purpura, [facility]  Radiation Oncologist - Dr. Margaretmary Dys, [facility]     Recommendations: 1) Surgery     * These recommendations are based on information available as of today's consult.      Recommendations may change depending on the results of further tests or exams.    Next Steps: 1) [facility] will contact you with a surgery date.     When appointments need to be scheduled, you will be contacted by [facility] and/or [facility].  Questions?  Please do not hesitate to call Cherlyn Cushing, BSN, RN at [phone] with any questions or concerns.  [name] is your Oncology Nurse Navigator and is available to assist you while you're receiving your medical care at [facility].

## 2022-10-22 NOTE — Progress Notes (Signed)
RN left voicemail for reminder for upcoming appointment for the [facility].  Direct contact left for any questions prior to appointment.

## 2022-10-23 ENCOUNTER — Encounter: Payer: Self-pay | Admitting: Radiation Oncology

## 2022-10-23 ENCOUNTER — Telehealth: Payer: Self-pay | Admitting: Genetic Counselor

## 2022-10-23 ENCOUNTER — Ambulatory Visit
Admission: RE | Admit: 2022-10-23 | Discharge: 2022-10-23 | Disposition: A | Discharge status: Home / Self Care | Payer: BC Managed Care – PPO | Source: Ambulatory Visit | Attending: Radiation Oncology | Admitting: Radiation Oncology

## 2022-10-23 VITALS — BP 141/72 | HR 63 | Temp 96.9°F | Ht 69.0 in | Wt 239.1 lb

## 2022-10-23 DIAGNOSIS — Z191 Hormone sensitive malignancy status: Secondary | ICD-10-CM | POA: Diagnosis not present

## 2022-10-23 DIAGNOSIS — C61 Malignant neoplasm of prostate: Secondary | ICD-10-CM | POA: Diagnosis not present

## 2022-10-23 NOTE — Progress Notes (Signed)
Radiation Oncology         [phone] ________________________________  Multidisciplinary Prostate Cancer Clinic  Initial Radiation Oncology Consultation  Name: Andrew Arnold MRN: [id number]  Date: 10/23/2022  DOB: Feb 04, 1956  AY:[id number], Brett Canales, MD  Bjorn Pippin, MD   REFERRING PHYSICIAN: Bjorn Pippin, MD  DIAGNOSIS: 67 y.o. gentleman with stage T1c adenocarcinoma of the prostate with a Gleason's score of 4+3 and a PSA of 6.3    ICD-10-CM   1. Malignant neoplasm of prostate  C61       HISTORY OF PRESENT ILLNESS::Andrew Arnold is a 67 y.o. gentleman. He has been followed by Dr. Annabell Howells since 03/2021 for LUTS and was placed on tamsulosin. At that time, he was found to have an elevated PSA of 5.5. He was scheduled for prostate biopsy in 05/2021 but had other medical concerns that took precedent. A repeat PSA in 10/2021 was stable at 5.6. He returned for follow up in 05/2022 and was found to have a further rise in his PSA to 6.3. Digital rectal examination was performed at that time showing some right apical induration. This prompted prostate MRI on 06/29/22 showing a PI-RADS 4 lesion in the left posterolateral peripheral zone at the base well as a PI-RADS 4 lesion in the right posterolateral mid-gland transitional zone. The patient proceeded to MRI fusion biopsy of the prostate on 07/27/22.  The prostate volume measured 90 cc.  Out of 19 core biopsies, 11 were positive.  The maximum Gleason score was 4+3, and this was seen in all three cores from ROI #1, as well as the right base (with perineural invasion), and right mid (with PNI). Additionally, Gleason 3+4 was seen in one sample from ROI #2 (small focus), and Gleason 3+3 in the left base lateral, left apex lateral, left apex, right apex, right mid lateral.    He underwent staging CT A/P and bone scan on 09/02/22. Both scans were negative for metastatic disease.   The patient reviewed the biopsy results with his urologist and he has  kindly been referred today to the multidisciplinary prostate cancer clinic for presentation of pathology and radiology studies in our conference for discussion of potential radiation treatment options and clinical evaluation.  PREVIOUS RADIATION THERAPY: No  PAST MEDICAL HISTORY:  has a past medical history of Allergic rhinitis due to pollen, Arrhythmia, Diabetes mellitus without complication, HTN (hypertension), Hyperlipidemia, Nausea and vomiting (08/01/2021), OSA (obstructive sleep apnea), and prostate ca (07/2022).    PAST SURGICAL HISTORY: Past Surgical History:  Procedure Laterality Date   ANTERIOR CERVICAL DECOMP/DISCECTOMY FUSION N/A 06/03/2021   Procedure: Anterior Cervical Decompression Fusion  Cervical three-four;  Surgeon: Bedelia Person, MD;  Location: [facility] OR;  Service: Neurosurgery;  Laterality: N/A;   colonoscopy  07/14/2003   Dr. Jena Gauss: normal rectum, sigmoid diverticula in colonic mucosa   COLONOSCOPY N/A 02/15/2014   Procedure: COLONOSCOPY;  Surgeon: Corbin Ade, MD;  Location: [facility];  Service: Endoscopy;  Laterality: N/A;  2:00   COLONOSCOPY WITH PROPOFOL N/A 05/08/2022   Procedure: COLONOSCOPY WITH PROPOFOL;  Surgeon: Corbin Ade, MD;  Location: [facility];  Service: Endoscopy;  Laterality: N/A;  11:00 am   PROSTATE BIOPSY     RIGHT/LEFT HEART CATH AND CORONARY ANGIOGRAPHY N/A 05/23/2021   Procedure: RIGHT/LEFT HEART CATH AND CORONARY ANGIOGRAPHY;  Surgeon: Dolores Patty, MD;  Location: [facility];  Service: Cardiovascular;  Laterality: N/A;    FAMILY HISTORY: family history includes Breast cancer (age of  onset: 75) in his mother; Cirrhosis in his father; Diabetes in his sister; Other in an other family member.  SOCIAL HISTORY:  reports that he has never smoked. He has never used smokeless tobacco. He reports that he does not drink alcohol and does not use drugs.  ALLERGIES: Penicillins  MEDICATIONS:  Current Outpatient Medications   Medication Sig Dispense Refill   SODIUM FLUORIDE 5000 PPM 1.1 % GEL dental gel Take by mouth at bedtime.     amLODipine (NORVASC) 10 MG tablet Take 1 tablet (10 mg total) by mouth daily. 30 tablet 0   ascorbic acid (VITAMIN C) 500 MG tablet Take 500 mg by mouth every morning.      aspirin 81 MG chewable tablet Chew 1 tablet (81 mg total) by mouth daily. 30 tablet 0   atorvastatin (LIPITOR) 80 MG tablet Take 80 mg by mouth at bedtime.     B Complex Vitamins (B COMPLEX 100 PO) Take 1 tablet by mouth every morning.      carvedilol (COREG) 6.25 MG tablet Take 1 tablet (6.25 mg total) by mouth 2 (two) times daily with a meal. 60 tablet 0   celecoxib (CELEBREX) 100 MG capsule Take 1 capsule (100 mg total) by mouth 2 (two) times daily as needed for pain. 60 capsule 3   cetirizine (ZYRTEC) 10 MG tablet Take 10 mg by mouth in the morning.     cyanocobalamin (VITAMIN B12) 1000 MCG tablet Take 2,000 mcg by mouth daily.     dapagliflozin propanediol (FARXIGA) 10 MG TABS tablet Take 1 tablet (10 mg total) by mouth daily. Please call for an office visit [phone] 30 tablet 3   ENTRESTO 24-26 MG TAKE 1 TABLET BY MOUTH TWICE DAILY 60 tablet 1   metFORMIN (GLUCOPHAGE) 1000 MG tablet Take 1,000 mg by mouth 2 (two) times daily.     pantoprazole (PROTONIX) 40 MG tablet Take 40 mg by mouth daily.     potassium chloride SA (KLOR-CON M) 20 MEQ tablet TAKE 1 TABLET BY MOUTH EVERY DAY 90 tablet 3   spironolactone (ALDACTONE) 25 MG tablet TAKE 1/2 TABLET(12.5 MG) BY MOUTH DAILY 15 tablet 6   tamsulosin (FLOMAX) 0.4 MG CAPS capsule Take 1 capsule (0.4 mg total) by mouth daily. 90 capsule 3   No current facility-administered medications for this encounter.    REVIEW OF SYSTEMS:  On review of systems, the patient reports that he is doing well overall. He denies any chest pain, shortness of breath, cough, fevers, chills, night sweats, unintended weight changes. He denies any bowel disturbances, and denies abdominal  pain, nausea or vomiting. He denies any new musculoskeletal or joint aches or pains. His IPSS was 7, indicating mild urinary symptoms. His SHIM was 12, indicating he has moderate erectile dysfunction. A complete review of systems is obtained and is otherwise negative.   PHYSICAL EXAM:  Wt Readings from Last 3 Encounters:  10/23/22 239 lb 2 oz (108.5 kg)  09/10/22 239 lb (108.4 kg)  08/25/22 235 lb (106.6 kg)   Temp Readings from Last 3 Encounters:  10/23/22 (!) 96.9 F (36.1 C) (Temporal)  07/02/22 98.2 F (36.8 C) (Oral)  05/08/22 97.7 F (36.5 C) (Oral)   BP Readings from Last 3 Encounters:  10/23/22 (!) 141/72  09/10/22 124/72  08/25/22 122/78   Pulse Readings from Last 3 Encounters:  10/23/22 63  09/10/22 66  08/25/22 74    /10  In general this is a well appearing African-American man in no acute  distress. He's alert and oriented x4 and appropriate throughout the examination. Cardiopulmonary assessment is negative for acute distress and he exhibits normal effort.    KPS = 100  100 - Normal; no complaints; no evidence of disease. 90   - Able to carry on normal activity; minor signs or symptoms of disease. 80   - Normal activity with effort; some signs or symptoms of disease. 79   - Cares for self; unable to carry on normal activity or to do active work. 60   - Requires occasional assistance, but is able to care for most of his personal needs. 50   - Requires considerable assistance and frequent medical care. 40   - Disabled; requires special care and assistance. 30   - Severely disabled; hospital admission is indicated although death not imminent. 20   - Very sick; hospital admission necessary; active supportive treatment necessary. 10   - Moribund; fatal processes progressing rapidly. 0     - Dead  Karnofsky DA, Abelmann WH, Craver LS and [facility] [phone]) The use of the nitrogen mustards in the palliative treatment of carcinoma: with particular reference to  bronchogenic carcinoma Cancer 1 634-56   LABORATORY DATA:  Lab Results  Component Value Date   WBC 3.2 (L) 10/19/2022   HGB 11.8 (L) 10/19/2022   HCT 35.9 (L) 10/19/2022   MCV 95.5 10/19/2022   PLT 208 10/19/2022   Lab Results  Component Value Date   NA 140 05/06/2022   K 4.5 05/06/2022   CL 105 05/06/2022   CO2 28 05/06/2022   Lab Results  Component Value Date   ALT 10 08/05/2021   AST 14 (L) 08/05/2021   ALKPHOS 92 08/05/2021   BILITOT 0.3 08/05/2021     RADIOGRAPHY: SLEEP STUDY DOCUMENTS  Result Date: 09/29/2022 Ordered by an unspecified provider.     IMPRESSION/PLAN: 67 y.o. gentleman with Stage T1c adenocarcinoma of the prostate with a Gleason score of 4+3 and a PSA of 6.3.    We discussed the patient's workup and outlined the nature of prostate cancer in this setting. The patient's T stage, Gleason's score, and PSA put him into the unfavorable intermediate risk group. Accordingly, he is eligible for a variety of potential treatment options including brachytherapy, 5.5 weeks of external radiation +/- ST-ADT, or prostatectomy. We discussed the available radiation techniques, and focused on the details and logistics of delivery. The patient is not an ideal candidate for brachytherapy with a prostate volume of 90 cc.  Therefore, we discussed and outlined the risks, benefits, short and long-term effects associated with daily external beam radiotherapy and compared and contrasted these with prostatectomy. We discussed the role of SpaceOAR gel in reducing the rectal toxicity associated with radiotherapy.   The patient focused most of his questions and interest in robotic-assisted laparoscopic radical prostatectomy.  We discussed some of the potential advantages of surgery including surgical staging, the availability of salvage radiotherapy to the prostatic fossa, and the confidence associated with immediate biochemical response. We discussed some of the potential proven indications  for postoperative radiotherapy including positive margins, extracapsular extension, and seminal vesicle involvement. We also talked about some of the other potential findings leading to a recommendation for radiotherapy including a non-zero postoperative PSA and positive lymph nodes.   He appears to have a good understanding of his disease and our treatment recommendations which are of curative intent.  He was encouraged to ask questions that were answered to his stated satisfaction and at the end  of our conversation, the patient is leaning towards proceeding with prostatectomy.  He will meet with Dr. Laverle Patter today in our multidisciplinary clinic to discuss this further and if for any reason, he decides to proceed with radiotherapy instead of surgery, we would be more than happy to continue to participate in his care. We enjoyed meeting with him today, and will look forward to following his progress.  He knows that he is welcome to call at anytime with any questions or concerns related to the radiation treatment options discussed today.  We personally spent 60 minutes in this encounter including chart review, reviewing radiological studies, meeting face-to-face with the patient, entering orders and completing documentation.    Marguarite Arbour, PA-C    Margaretmary Dys, MD  [facility]  Radiation Oncology Direct Dial: [phone]  Fax: [phone] [email or web address]  Skype  [email or web address]    This document serves as a record of services personally performed by Margaretmary Dys, MD and Marcello Fennel, PA-C. It was created on their behalf by Mickie Bail, a trained medical scribe. The creation of this record is based on the scribe's personal observations and the provider's statements to them. This document has been checked and approved by the attending provider.

## 2022-10-23 NOTE — Telephone Encounter (Signed)
Mr. Huyett was seen by a genetic counselor during the prostate cancer multidisciplinary clinic on 10/23/2022. In addition to his personal history of prostate cancer, he reported a family history of breast cancer in his mother diagnosed at age 69. He does not meet NCCN criteria for genetic testing at this time. He was still offered genetic counseling and testing but declined. We encourage him to contact us if there are any changes to his personal or family history of cancer. If he meets NCCN criteria based on the updated personal/family history, he would be recommended to have genetic counseling and testing.   Lalla Brothers, MS, [facility] Genetic Counselor [email or web address] (P) [phone]

## 2022-10-23 NOTE — Consult Note (Signed)
Multi-Disciplinary Clinic     10/23/2022   --------------------------------------------------------------------------------   Andrew Arnold  MRN: [id number]  DOB: 15-May-1956, 67 year old Male  SSN:    PRIMARY CARE:     REFERRING:    PROVIDER:  Bjorn Pippin, M.D.  TREATING:  Heloise Purpura, M.D.  LOCATION:  [facility] [phone]     --------------------------------------------------------------------------------   CC/HPI: CC: Prostate Cancer   Physician requesting consult: Dr. Bjorn Pippin  PCP: Dr. John Giovanni  Location of consult: [facility] - Prostate Cancer Multidisciplinary Clinic   Andrew Arnold is a 67 year old gentleman with a history of GERD, OSA, diabetes, hyperlipidemia, and hypertension who was found to have an elevated PSA of 6.3. This prompted an MRI of the prostate on 06/30/22 that demonstrated a PI-RADS 4 lesion of the left base, and PI-RDS 4 lesion of the right transition zone and a large 106 cc gland with a large intravesical median lobe. He does have significant voiding symptoms and is treated with tamsulosin. He proceeded with an MR/US fusion biopsy on 07/27/22 that confirmed Gleason 4+3=7 adenocarcinoma of the prostate with all 3 ROI-1 targeted biopsies positive for predominantly pattern 4 Gleason 4+3=7 disease and a total of 11 out of 19 biopsies positive. Only 1 of the ROI-2 biopsies were positive. Conventional imaging studies were performed (due to patient having BCBS and denial of PSMA PET imaging) and they were negative for metastatic disease.   Family history: None.   Imaging studies:  MRI (06/30/22): No EPE, SVI, LAD, or bone lesions.  Bone scan (09/04/22): Negative for metastatic disease.  CT scan (09/02/22): Negative for metastatic disease.   PMH: He has a history of GERD, hypertension, hyperlipidemia, and diabetes,  PSH: No abdominal surgeries.   TNM stage: cT1c N0 Mx  PSA: 6.3  Gleason score: 4+3=7 (GG 3)   Biopsy (07/27/22): 11/19 cores positive  Left: L lateral apex (30%, 3+3=6), L apex (30%, 3+3=6), L lateral base (10%, 3+3=6)  Right: R paex (10%, 3+3=6), R mid (30%, 4+3=7, PNI), R lateral mid (10%, 3+3=6), R base (20%, 4+3=7, PNI)  Targeted: ROI-1: 3/3 cores positive (4+3=7 with 90% pattern 4 disease, 40%, 30%, 10%), ROI-2: 1/4 cores positive (3+4=7, 5%)  Prostate volume: 90 cc with large median lobe   Nomogram  OC disease: 25%  EPE: 73%  SVI: 17%  LNI: 21%  PFS (5 year, 10 year): 47%, 32%   Urinary function: IPSS is 7. He is treated with tamsulosin which has had minimal benefit. He does have a very enlarged prostate with a large intravesical median lobe.  Erectile function: SHIM score is 12. He states that he can achieve erections sometimes 2 most times and does not use medication.     ALLERGIES: Penicillin    MEDICATIONS: Aspirin  Metformin Hcl 1,000 mg tablet  Tamsulosin Hcl 0.4 mg capsule  Amlodipine Besylate 10 mg tablet  Atorvastatin Calcium 80 mg tablet  Carvedilol 6.25 mg tablet  Celecoxib 100 mg capsule  Entresto 24 mg-26 mg tablet  Farxiga 10 mg tablet  Pantoprazole Sodium 40 mg tablet, delayed release  Spironolactone 25 mg tablet     GU PSH: Prostate Needle Biopsy - 07/27/2022     NON-GU PSH: Neck Surgery (Unspecified) Surgical Pathology, Gross And Microscopic Examination For Prostate Needle - 07/27/2022     GU PMH: Abnormal radiologic findings on diagnostic imaging of other urinary organs - 07/27/2022 Elevated PSA - 07/27/2022    NON-GU PMH: Diabetes Type  2 GERD Hypercholesterolemia Hypertension    FAMILY HISTORY: Cancer - Mother Depression - Father Diabetes - Sister, Mother Hypertension - Mother Myocardial Infarction - Father   SOCIAL HISTORY: Marital Status: Unknown Preferred Language: English Current Smoking Status: Patient has never smoked.   Tobacco Use Assessment Completed: Used Tobacco in last 30 days? Does not use smokeless  tobacco. Has never drank.  Does not drink caffeine. 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    REVIEW OF SYSTEMS:    GU Review Male:   Patient denies frequent urination, hard to postpone urination, burning/ pain with urination, get up at night to urinate, leakage of urine, stream starts and stops, trouble starting your streams, and have to strain to urinate .  Gastrointestinal (Upper):   Patient denies nausea and vomiting.  Gastrointestinal (Lower):   Patient denies diarrhea and constipation.  Constitutional:   Patient denies fever, night sweats, weight loss, and fatigue.  Skin:   Patient denies skin rash/ lesion and itching.  Eyes:   Patient denies blurred vision and double vision.  Ears/ Nose/ Throat:   Patient denies sore throat and sinus problems.  Hematologic/Lymphatic:   Patient denies swollen glands and easy bruising.  Cardiovascular:   Patient denies leg swelling and chest pains.  Respiratory:   Patient denies cough and shortness of breath.  Endocrine:   Patient denies excessive thirst.  Musculoskeletal:   Patient denies back pain and joint pain.  Neurological:   Patient denies headaches and dizziness.  Psychologic:   Patient denies depression and anxiety.   VITAL SIGNS: None   GU PHYSICAL EXAMINATION:    Prostate: Prostate about 60 grams. There is some firmness on the right side of the prostate compared to the left without disease extending to the lateral edges.   MULTI-SYSTEM PHYSICAL EXAMINATION:    Constitutional: Well-nourished. No physical deformities. Normally developed. Good grooming.  Respiratory: No labored breathing, no use of accessory muscles. Clear bilaterally.  Cardiovascular: Normal temperature, normal extremity pulses, no swelling, no varicosities. Regular rate and rhythm.  Gastrointestinal: No masses. Nontender. Obese.     Complexity of Data:  Lab Test Review:   PSA  Records Review:   Pathology Reports, Previous Patient Records  X-Ray Review: C.T.  Abdomen/Pelvis: Reviewed Films.  MRI Prostate GSORAD: Reviewed Films.  Bone Scan: Reviewed Films.    Notes:                     CLINICAL DATA: Intermediate risk. Staging.   EXAM:  NUCLEAR MEDICINE WHOLE BODY BONE SCAN   TECHNIQUE:  Whole body anterior and posterior images were obtained approximately  3 hours after intravenous injection of radiopharmaceutical.   RADIOPHARMACEUTICALS: 20.2 mCi Technetium-30m MDP IV   COMPARISON: CT of the abdomen and pelvis September 02, 2022   FINDINGS:  Mild scattered uptake in the cervical, thoracic, and lumbar spine is  most likely degenerative. AC joint degenerative changes, left  greater than right. Degenerative changes at the left first MTP  joint. Degenerative changes in the knees. Uptake in the maxilla is  likely odontogenic. No other bony or soft tissue abnormalities  identified.   IMPRESSION:  1. No scintigraphic evidence of bony metastatic disease.  2. Degenerative changes as above.  3. No other abnormalities.    Electronically Signed  By: Gerome Sam III M.D.  On: 09/04/2022 08:24   CLINICAL DATA: Newly diagnosed, intermediate risk prostate  carcinoma. Staging. * Tracking Code: BO *   EXAM:  CT ABDOMEN AND PELVIS WITH CONTRAST  TECHNIQUE:  Multidetector CT imaging of the abdomen and pelvis was performed  using the standard protocol following bolus administration of  intravenous contrast.   RADIATION DOSE REDUCTION: This exam was performed according to the  departmental dose-optimization program which includes automated  exposure control, adjustment of the mA and/or kV according to  patient size and/or use of iterative reconstruction technique.   CONTRAST: OMNIPAQUE IOHEXOL 300 MG/ML SOLN   COMPARISON: Noncontrast CT on 08/02/2021   FINDINGS:  Lower Chest: No acute findings.   Hepatobiliary: No hepatic masses identified. Gallbladder is  unremarkable. No evidence of biliary ductal dilatation.   Pancreas:  No mass or inflammatory changes.   Spleen: Within normal limits in size and appearance.   Adrenals/Urinary Tract: No suspicious masses identified. No evidence  of ureteral calculi or hydronephrosis.   Stomach/Bowel: No evidence of obstruction, inflammatory process or  abnormal fluid collections. Normal appendix visualized.  Diverticulosis is seen mainly involving the sigmoid colon, however  there is no evidence of diverticulitis.   Vascular/Lymphatic: No pathologically enlarged lymph nodes. No acute  vascular findings. Aortic atherosclerotic calcification incidentally  noted.   Reproductive: Stable moderate to markedly enlarged prostate gland,  with median lobe hypertrophy indenting the bladder base.   Other: None.   Musculoskeletal: No suspicious bone lesions identified.   IMPRESSION:  Stable enlarged prostate gland. No evidence of metastatic disease  within the abdomen or pelvis.   Colonic diverticulosis, without radiographic evidence of  diverticulitis.   Aortic Atherosclerosis (ICD10-I70.0).    Electronically Signed  By: Danae Orleans M.D.  On: 09/02/2022 16:07   CLINICAL DATA: A 67 year old male presents for evaluation of  elevated PSA, suspected prostate cancer.   EXAM:  MR PROSTATE WITHOUT AND WITH CONTRAST   TECHNIQUE:  Multiplanar multisequence MRI images were obtained of the pelvis  centered about the prostate. Pre and post contrast images were  obtained.   CONTRAST: 10 mL Vueway   COMPARISON: None available   FINDINGS:  Prostate: Prostatomegaly. Heterogeneous transitional zone  predominantly low signal throughout the transitional zone with  changes of BPH. Large hypertrophied median lobe extends into the  bladder. Areas of linear and wedge-shaped T2 hypointensity in the  peripheral zone.   Peripheral zone: LEFT posterolateral peripheral zone of the prostate  base (image 21/6) 7 mm area of restricted diffusion corresponding  decreased T2 signal.  PIRADS category 4. This also shows  hyperenhancement on postcontrast imaging. This area also shows  differential contrast kinetics and is seen on delayed phase imaging  with less enhancement than the surrounding prostate, shown to have  early enhancement on multiphase imaging.   Transitional zone: Vague area of more pronounced low T2 signal in  the RIGHT posterolateral mid group gland transitional zone displays  restricted diffusion. (Image 19/8 , image 20/6 and image 20/7)  PIRADS category numeral 4   Volume: 106 cc as segmented. Note that the most inferior aspect of  the gland is excluded from high-resolution images, included on the  last image of the diffusion without signs of high-risk lesion.   Transcapsular spread: Absent   Seminal vesicle involvement: Absent   Neurovascular bundle involvement: Absent   Pelvic adenopathy: Absent   Bone metastasis: Absent   Other findings: None   IMPRESSION:  1. PIRADS category 4 lesion in the LEFT posterolateral peripheral  zone at the prostate base.  2. PIRADS category 4 lesion in the RIGHT posterolateral mid-gland  transitional zone.  3. Marked prostatomegaly with changes of BPH and  sequela of prior  prostatitis.    Electronically Signed  By: Donzetta Kohut M.D.  On: 06/30/2022 09:02   PROCEDURES: None   ASSESSMENT:      ICD-10 Details  1 GU:   Prostate Cancer - C61    PLAN:           Document Letter(s):  Created for Patient: Clinical Summary         Notes:   1. Unfavorable intermediate risk prostate cancer: I had a detailed conversation with Andrew Arnold and his brother-in-law today regarding his prostate cancer situation and options for management/treatment. Taking into account his age/life expectancy and his disease parameters, it was recommended that he undergo therapy of curative intent. He is also seeing Dr. Kathrynn Running this morning to discuss his options.   The patient was counseled about the natural history of prostate  cancer and the standard treatment options that are available for prostate cancer. It was explained to him how his age and life expectancy, clinical stage, Gleason score/prognostic grade group, and PSA (and PSA density) affect his prognosis, the decision to proceed with additional staging studies, as well as how that information influences recommended treatment strategies. We discussed the roles for active surveillance, radiation therapy, surgical therapy, androgen deprivation, as well as ablative therapy and other investigational options for the treatment of prostate cancer as appropriate to his individual cancer situation. We discussed the risks and benefits of these options with regard to their impact on cancer control and also in terms of potential adverse events, complications, and impact on quality of life particularly related to urinary and sexual function. The patient was encouraged to ask questions throughout the discussion today and all questions were answered to his stated satisfaction. In addition, the patient was provided with and/or directed to appropriate resources and literature for further education about prostate cancer and treatment options.   We discussed surgical therapy for prostate cancer including the different available surgical approaches. We discussed, in detail, the risks and expectations of surgery with regard to cancer control, urinary control, and erectile function as well as the expected postoperative recovery process. Additional risks of surgery including but not limited to bleeding, infection, hernia formation, nerve damage, lymphocele formation, bowel/rectal injury potentially necessitating colostomy, damage to the urinary tract resulting in urine leakage, urethral stricture, and the cardiopulmonary risks such as myocardial infarction, stroke, death, venothromboembolism, etc. were explained. The risk of open surgical conversion for robotic/laparoscopic prostatectomy was also  discussed.   After reviewing options, he is most interested in surgical therapy. Especially considering his large prostate size and significant intravesical median lobe, this would appear to make good sense. He will be scheduled for a bilateral nerve sparing robot-assisted laparoscopic radical prostatectomy and bilateral pelvic lymphadenectomy.   CC: Dr. John Giovanni  Dr. Bjorn Pippin  Dr. Margaretmary Dys    E & M CODES: We spent 64 minutes dedicated to evaluation and management time, including face to face interaction, discussions on coordination of care, documentation, result review, and discussion with others as applicable.

## 2022-10-28 ENCOUNTER — Ambulatory Visit: Payer: BC Managed Care – PPO | Admitting: Gastroenterology

## 2022-10-28 ENCOUNTER — Encounter: Payer: Self-pay | Admitting: Gastroenterology

## 2022-10-28 VITALS — BP 116/78 | HR 60 | Temp 97.7°F | Ht 69.0 in | Wt 241.4 lb

## 2022-10-28 DIAGNOSIS — D649 Anemia, unspecified: Secondary | ICD-10-CM

## 2022-10-28 NOTE — Progress Notes (Signed)
GI Office Note    Referring Provider: Gareth Morgan, MD Primary Care Physician:  Gareth Morgan, MD  Primary Gastroenterologist: Roetta Sessions, MD   Chief Complaint   Chief Complaint  Patient presents with   Follow-up    Doing well, no issues    History of Present Illness   Andrew Arnold is a 67 y.o. male presenting today for follow up anemia. Last seen in 03/2022.   Completed colonoscopy 04/2022. He had diverticulosis. Next colonoscopy advised in 10 years.   Recent diagnosis of prostate cancer. Negative bone scan 08/2022. CT A/P with contrast 08/2022 with enlarged prostate gland, no metastatic disease noted. Plans for prostate surgery.    Recent labs 10/19/22 with Hgb 11.8 (chronic/stable), MCV 95.5, iron 94 (up from 42 three months prior), TIBC 284, iron sat 33 (up from 14% three months prior), ferritin 101. B12 and folate normal 07/2022. Ifobt requested, he needs to repeat due to improper collection.   He denies melena, brbpr. No constipation, diarrhea, abd pain, vomiting, heartburn, dysphagia. No gross hematuria. His stools are very dark in setting of iron. Patient is on celebrex as needed, ASA  daily.   Medications   Current Outpatient Medications  Medication Sig Dispense Refill   amLODipine (NORVASC) 10 MG tablet Take 1 tablet (10 mg total) by mouth daily. 30 tablet 0   ascorbic acid (VITAMIN C) 500 MG tablet Take 500 mg by mouth every morning.      aspirin 81 MG chewable tablet Chew 1 tablet (81 mg total) by mouth daily. 30 tablet 0   atorvastatin (LIPITOR) 80 MG tablet Take 80 mg by mouth at bedtime.     B Complex Vitamins (B COMPLEX 100 PO) Take 1 tablet by mouth every morning.      carvedilol (COREG) 6.25 MG tablet Take 1 tablet (6.25 mg total) by mouth 2 (two) times daily with a meal. 60 tablet 0   celecoxib (CELEBREX) 100 MG capsule Take 1 capsule (100 mg total) by mouth 2 (two) times daily as needed for pain. 60 capsule 3   cetirizine (ZYRTEC) 10 MG  tablet Take 10 mg by mouth in the morning.     cyanocobalamin (VITAMIN B12) 1000 MCG tablet Take 2,000 mcg by mouth daily.     dapagliflozin propanediol (FARXIGA) 10 MG TABS tablet Take 1 tablet (10 mg total) by mouth daily. Please call for an office visit [phone] 30 tablet 3   ENTRESTO 24-26 MG TAKE 1 TABLET BY MOUTH TWICE DAILY 60 tablet 1   metFORMIN (GLUCOPHAGE) 1000 MG tablet Take 1,000 mg by mouth 2 (two) times daily.     pantoprazole (PROTONIX) 40 MG tablet Take 40 mg by mouth daily.     potassium chloride SA (KLOR-CON M) 20 MEQ tablet TAKE 1 TABLET BY MOUTH EVERY DAY 90 tablet 3   spironolactone (ALDACTONE) 25 MG tablet TAKE 1/2 TABLET(12.5 MG) BY MOUTH DAILY 15 tablet 6   tamsulosin (FLOMAX) 0.4 MG CAPS capsule Take 1 capsule (0.4 mg total) by mouth daily. 90 capsule 3   No current facility-administered medications for this visit.    Allergies   Allergies as of 10/28/2022 - Review Complete 10/28/2022  Allergen Reaction Noted   Penicillins  09/17/2021          Review of Systems   General: Negative for anorexia, weight loss, fever, chills, fatigue, weakness. ENT: Negative for hoarseness, difficulty swallowing , nasal congestion. CV: Negative for chest pain, angina, palpitations, dyspnea on exertion, peripheral  edema.  Respiratory: Negative for dyspnea at rest, dyspnea on exertion, cough, sputum, wheezing.  GI: See history of present illness. GU:  Negative for dysuria, hematuria, urinary incontinence, urinary frequency, nocturnal urination.  Endo: Negative for unusual weight change.     Physical Exam   BP 116/78 (BP Location: Right Arm, Patient Position: Sitting, Cuff Size: Large)   Pulse 60   Temp 97.7 F (36.5 C) (Oral)   Ht  (1.753 m)   Wt 241 lb 6.4 oz (109.5 kg)   SpO2 96%   BMI 35.65 kg/m    General: Well-nourished, well-developed in no acute distress.  Eyes: No icterus. Mouth: Oropharyngeal mucosa moist and pink  Abdomen: Bowel sounds are normal,  nontender, nondistended, no hepatosplenomegaly or masses,  no abdominal bruits or hernia , no rebound or guarding.  Rectal: not performed Extremities: No lower extremity edema. No clubbing or deformities. Neuro: Alert and oriented x 4   Skin: Warm and dry, no jaundice.   Psych: Alert and cooperative, normal mood and affect.  Labs   Lab Results  Component Value Date   IRON 94 10/19/2022   TIBC 284 10/19/2022   FERRITIN 101 10/19/2022   Lab Results  Component Value Date   WBC 3.2 (L) 10/19/2022   HGB 11.8 (L) 10/19/2022   HCT 35.9 (L) 10/19/2022   MCV 95.5 10/19/2022   PLT 208 10/19/2022   Lab Results  Component Value Date   VITAMINB12 768 07/14/2022   Lab Results  Component Value Date   FOLATE >24.0 07/14/2022   Lab Results  Component Value Date   CREATININE 1.10 09/02/2022   BUN 21 05/06/2022   NA 140 05/06/2022   K 4.5 05/06/2022   CL 105 05/06/2022   CO2 28 05/06/2022   Lab Results  Component Value Date   ALT 10 08/05/2021   AST 14 (L) 08/05/2021   ALKPHOS 92 08/05/2021   BILITOT 0.3 08/05/2021    Imaging Studies   SLEEP STUDY DOCUMENTS  Result Date: 09/29/2022 Ordered by an unspecified provider.   Assessment   Normocytic anemia: chronic, mildly low serum iron/iron sat but normal TIBC. Has been on oral iron with improved iron/iron sat. Hgb slightly improved but still below normal. Ifobt pending. His colonoscopy is up to date. He is on ASA  daily and celebrex prn in setting of PPI. Cannot exclude chronic occult GI bleeding.    PLAN   Check ifobt.  If ifobt positive, would consider upper endoscopy once recovered from prostate surgery.    Leanna Battles. Melvyn Neth, MHS, PA-C [facility]

## 2022-10-28 NOTE — Patient Instructions (Signed)
Complete stool test. We will call with results.

## 2022-10-29 ENCOUNTER — Other Ambulatory Visit: Payer: Self-pay | Admitting: Urology

## 2022-10-31 ENCOUNTER — Other Ambulatory Visit: Payer: Self-pay | Admitting: Neurology

## 2022-11-11 DIAGNOSIS — E1165 Type 2 diabetes mellitus with hyperglycemia: Secondary | ICD-10-CM | POA: Diagnosis not present

## 2022-11-11 DIAGNOSIS — I10 Essential (primary) hypertension: Secondary | ICD-10-CM | POA: Diagnosis not present

## 2022-11-11 DIAGNOSIS — M5136 Other intervertebral disc degeneration, lumbar region: Secondary | ICD-10-CM | POA: Diagnosis not present

## 2022-11-11 DIAGNOSIS — E559 Vitamin D deficiency, unspecified: Secondary | ICD-10-CM | POA: Diagnosis not present

## 2022-11-14 ENCOUNTER — Other Ambulatory Visit: Payer: Self-pay | Admitting: Urology

## 2022-11-14 DIAGNOSIS — N401 Enlarged prostate with lower urinary tract symptoms: Secondary | ICD-10-CM

## 2022-11-17 DIAGNOSIS — R8271 Bacteriuria: Secondary | ICD-10-CM | POA: Diagnosis not present

## 2022-11-17 DIAGNOSIS — C61 Malignant neoplasm of prostate: Secondary | ICD-10-CM | POA: Diagnosis not present

## 2022-11-25 NOTE — Telephone Encounter (Signed)
[place] Numbers: [id number] exp. 11/25/22-12/24/22 for AP

## 2022-11-28 ENCOUNTER — Ambulatory Visit (HOSPITAL_COMMUNITY)
Admission: RE | Admit: 2022-11-28 | Discharge: 2022-11-28 | Disposition: A | Discharge status: Home / Self Care | Payer: BC Managed Care – PPO | Source: Ambulatory Visit | Attending: Neurology | Admitting: Neurology

## 2022-11-28 DIAGNOSIS — M545 Low back pain, unspecified: Secondary | ICD-10-CM | POA: Diagnosis not present

## 2022-11-28 DIAGNOSIS — G8929 Other chronic pain: Secondary | ICD-10-CM

## 2022-11-28 DIAGNOSIS — R202 Paresthesia of skin: Secondary | ICD-10-CM | POA: Diagnosis not present

## 2022-11-28 DIAGNOSIS — R269 Unspecified abnormalities of gait and mobility: Secondary | ICD-10-CM | POA: Diagnosis not present

## 2022-11-30 DIAGNOSIS — C61 Malignant neoplasm of prostate: Secondary | ICD-10-CM | POA: Diagnosis not present

## 2022-11-30 DIAGNOSIS — M6281 Muscle weakness (generalized): Secondary | ICD-10-CM | POA: Diagnosis not present

## 2022-12-07 NOTE — Progress Notes (Signed)
COVID Vaccine received:  []  No [x]  Yes Date of any COVID positive Test in last 90 days:  None  PCP - Gareth Morgan, MD Cardiologist - Nicholes Mango, MD Sleep Study- Armanda Magic, MD Neurology- Levert Feinstein, MD  Chest x-ray - 11-08-2021   2v     CT Chest wo Contrast- 06-29-2022 EKG -  11-10-2021 Epic   Will repeat at PST Stress Test -  ECHO - 05-15-2021 Epic Cardiac Cath - [facility] 05-23-2021 by Dr. Gala Romney  PCR screen: []  Ordered & Completed           []   No Order but Needs PROFEND           [x]   N/A for this surgery  Surgery Plan:  []  Ambulatory                            [x]  Outpatient in bed                            []  Admit  Anesthesia:    [x]  General  []  Spinal                           []   Choice []   MAC  Bowel Prep - []  No  [x]   Yes __Magnesium Citrate & Fleet Enema  Pacemaker / ICD device [x]  No []  Yes   Spinal Cord Stimulator:[x]  No []  Yes       History of Sleep Apnea? []  No [x]  Yes  09-29-2022 mild CPAP used?- [x]  No []  Yes  No CPAP needed per pt  Does the patient monitor blood sugar?          [x]  No []  Yes  []  N/A  Patient has: []  NO Hx DM   []  Pre-DM                 []  DM1  [x]   DM2 Does patient have a [employer] or Dexacom? [x]  No []  Yes   Fasting Blood Sugar Ranges-  Checks Blood Sugar _____ times a day  SGLT-2 inhibitors / usual dose - Farxiga 10 mg daily SGLT-2 instructions: hold 72 hours prior to DOS.  Last Dose: Sunday 12-13-2022  Other Diabetic medications/ instructions: Metformin  1000 mg bid,  Patient will not take DOS  Blood Thinner / Instructions:None Aspirin Instructions:  ASA 81 mg   hold 5-7 days   ERAS Protocol Ordered: [x]  No  []  Yes Patient is to be NPO after: Midnight prior   Comments:   Activity level: Patient is  unable to climb a flight of stairs without difficulty; [x]  No CP  [x]  No SOB, but would have back and leg pain.  Patient can perform ADLs without assistance.   Anesthesia review: HTN, CHF (R/LHC by  Dr. Gala Romney)  Nonobstructive CAD, DM2, s/p ACDF (C3-4 on 06-03-2021). GERD, Mild OSA-  No CPAP  Patient denies shortness of breath, fever, cough and chest pain at PAT appointment.  Patient verbalized understanding and agreement to the Pre-Surgical Instructions that were given to them at this PAT appointment. Patient was also educated of the need to review these PAT instructions again prior to his surgery.I reviewed the appropriate phone numbers to call if they have any and questions or concerns.

## 2022-12-07 NOTE — Patient Instructions (Addendum)
SURGICAL WAITING ROOM VISITATION Patients having surgery or a procedure may have no more than 2 support people in the waiting area - these visitors may rotate in the visitor waiting room.   Due to an increase in RSV and influenza rates and associated hospitalizations, children ages 54 and under may not visit patients in [facility] hospitals. If the patient needs to stay at the hospital during part of their recovery, the visitor guidelines for inpatient rooms apply.  PRE-OP VISITATION  Pre-op nurse will coordinate an appropriate time for 1 support person to accompany the patient in pre-op.  This support person may not rotate.  This visitor will be contacted when the time is appropriate for the visitor to come back in the pre-op area.  Please refer to the [facility] website for the visitor guidelines for Inpatients (after your surgery is over and you are in a regular room).  You are not required to quarantine at this time prior to your surgery. However, you must do this: Hand Hygiene often Do NOT share personal items Notify your provider if you are in close contact with someone who has COVID or you develop fever 100.4 or greater, new onset of sneezing, cough, sore throat, shortness of breath or body aches.  If you test positive for Covid or have been in contact with anyone that has tested positive in the last 10 days please notify you surgeon.    Your procedure is scheduled on:  Thursday   December 17, 2022  Report to [facility] Main Entrance: [address] entrance where the [employer] is available.   Report to admitting at:   05:15  AM  +++++Call this number if you have any questions or problems the morning of surgery [phone]  DO NOT EAT OR DRINK ANYTHING AFTER MIDNIGHT THE NIGHT PRIOR TO YOUR SURGERY / PROCEDURE.   FOLLOW BOWEL PREP AND ANY ADDITIONAL PRE OP INSTRUCTIONS YOU RECEIVED FROM YOUR SURGEON'S OFFICE!!!  Drink 1 bottle (8 Ounces) of  Magnesium Citrate by noon the day  before your surgery.    Use 1 Fleet's Enema the night before your surgery.    Oral Hygiene is also important to reduce your risk of infection.        Remember - BRUSH YOUR TEETH THE MORNING OF SURGERY WITH YOUR REGULAR TOOTHPASTE  Do NOT smoke after Midnight the night before surgery.  Take ONLY these medicines the morning of surgery with A SIP OF WATER:Pantoprazole (Protonix), Tamsulosin (Flomax), amlodipine, Carvedilol    If You have been diagnosed with Sleep Apnea - Bring CPAP mask and tubing day of surgery. We will provide you with a CPAP machine on the day of your surgery.                   You may not have any metal on your body including  jewelry, and body piercing  Do not wear lotions, powders,  cologne, or deodorant  Men may shave face and neck.  Contacts, Hearing Aids, dentures or bridgework may not be worn into surgery. DENTURES WILL BE REMOVED PRIOR TO SURGERY PLEASE DO NOT APPLY "Poly grip" OR ADHESIVES!!!  You may bring a small overnight bag with you on the day of surgery, only pack items that are not valuable. [facility] IS NOT RESPONSIBLE   FOR VALUABLES THAT ARE LOST OR STOLEN.   Do not bring your home medications to the hospital. The Pharmacy will dispense medications listed on your medication list to you during  your admission in the Hospital.  Special Instructions: Bring a copy of your healthcare power of attorney and living will documents the day of surgery, if you wish to have them scanned into your [facility] Records- EPIC  Please read over the following fact sheets you were given: IF YOU HAVE QUESTIONS ABOUT YOUR PRE-OP INSTRUCTIONS, PLEASE CALL [phone].   [facility] - Preparing for Surgery Before surgery, you can play an important role.  Because skin is not sterile, your skin needs to be as free of germs as possible.  You can reduce the number of germs on your skin by washing with CHG (chlorahexidine gluconate) soap before surgery.  CHG is an  antiseptic cleaner which kills germs and bonds with the skin to continue killing germs even after washing. Please DO NOT use if you have an allergy to CHG or antibacterial soaps.  If your skin becomes reddened/irritated stop using the CHG and inform your nurse when you arrive at Short Stay. Do not shave (including legs and underarms) for at least 48 hours prior to the first CHG shower.  You may shave your face/neck.  Please follow these instructions carefully:  1.  Shower with CHG Soap the night before surgery and the  morning of surgery.  2.  If you choose to wash your hair, wash your hair first as usual with your normal  shampoo.  3.  After you shampoo, rinse your hair and body thoroughly to remove the shampoo.                             4.  Use CHG as you would any other liquid soap.  You can apply chg directly to the skin and wash.  Gently with a scrungie or clean washcloth.  5.  Apply the CHG Soap to your body ONLY FROM THE NECK DOWN.   Do not use on face/ open                           Wound or open sores. Avoid contact with eyes, ears mouth and genitals (private parts).                       Wash face,  Genitals (private parts) with your normal soap.             6.  Wash thoroughly, paying special attention to the area where your  surgery  will be performed.  7.  Thoroughly rinse your body with warm water from the neck down.  8.  DO NOT shower/wash with your normal soap after using and rinsing off the CHG Soap.            9.  Pat yourself dry with a clean towel.            10.  Wear clean pajamas.            11.  Place clean sheets on your bed the night of your first shower and do not  sleep with pets.  ON THE DAY OF SURGERY : Do not apply any lotions/deodorants the morning of surgery.  Please wear clean clothes to the hospital/surgery center.    FAILURE TO FOLLOW THESE INSTRUCTIONS MAY RESULT IN THE CANCELLATION OF YOUR SURGERY  PATIENT  SIGNATURE_________________________________  NURSE SIGNATURE__________________________________  ________________________________________________________________________

## 2022-12-08 ENCOUNTER — Other Ambulatory Visit: Payer: Self-pay

## 2022-12-08 ENCOUNTER — Encounter (HOSPITAL_COMMUNITY)
Admission: RE | Admit: 2022-12-08 | Discharge: 2022-12-08 | Disposition: A | Discharge status: Home / Self Care | Payer: BC Managed Care – PPO | Source: Ambulatory Visit | Attending: Urology | Admitting: Urology

## 2022-12-08 ENCOUNTER — Encounter (HOSPITAL_COMMUNITY): Payer: Self-pay

## 2022-12-08 VITALS — BP 113/72 | HR 73 | Temp 97.8°F | Resp 18 | Ht 69.0 in | Wt 234.0 lb

## 2022-12-08 DIAGNOSIS — I2510 Atherosclerotic heart disease of native coronary artery without angina pectoris: Secondary | ICD-10-CM | POA: Insufficient documentation

## 2022-12-08 DIAGNOSIS — Z7984 Long term (current) use of oral hypoglycemic drugs: Secondary | ICD-10-CM | POA: Diagnosis not present

## 2022-12-08 DIAGNOSIS — I10 Essential (primary) hypertension: Secondary | ICD-10-CM

## 2022-12-08 DIAGNOSIS — C61 Malignant neoplasm of prostate: Secondary | ICD-10-CM | POA: Diagnosis not present

## 2022-12-08 DIAGNOSIS — G4733 Obstructive sleep apnea (adult) (pediatric): Secondary | ICD-10-CM | POA: Diagnosis not present

## 2022-12-08 DIAGNOSIS — E119 Type 2 diabetes mellitus without complications: Secondary | ICD-10-CM | POA: Diagnosis not present

## 2022-12-08 DIAGNOSIS — Z01818 Encounter for other preprocedural examination: Secondary | ICD-10-CM | POA: Diagnosis not present

## 2022-12-08 HISTORY — DX: Anemia, unspecified: D64.9

## 2022-12-08 HISTORY — DX: Atherosclerotic heart disease of native coronary artery without angina pectoris: I25.10

## 2022-12-08 LAB — CBC
HCT: 38 % — ABNORMAL LOW (ref 39.0–52.0)
Hemoglobin: 12.2 g/dL — ABNORMAL LOW (ref 13.0–17.0)
MCH: 31 pg (ref 26.0–34.0)
MCHC: 32.1 g/dL (ref 30.0–36.0)
MCV: 96.4 fL (ref 80.0–100.0)
Platelets: 205 10*3/uL (ref 150–400)
RBC: 3.94 MIL/uL — ABNORMAL LOW (ref 4.22–5.81)
RDW: 14.4 % (ref 11.5–15.5)
WBC: 3.6 10*3/uL — ABNORMAL LOW (ref 4.0–10.5)
nRBC: 0 % (ref 0.0–0.2)

## 2022-12-08 LAB — BASIC METABOLIC PANEL
Anion gap: 10 (ref 5–15)
BUN: 28 mg/dL — ABNORMAL HIGH (ref 8–23)
CO2: 21 mmol/L — ABNORMAL LOW (ref 22–32)
Calcium: 9.3 mg/dL (ref 8.9–10.3)
Chloride: 110 mmol/L (ref 98–111)
Creatinine, Ser: 1.24 mg/dL (ref 0.61–1.24)
GFR, Estimated: >60 mL/min (ref >60)
Glucose, Bld: 101 mg/dL — ABNORMAL HIGH (ref 70–99)
Potassium: 4.5 mmol/L (ref 3.5–5.1)
Sodium: 141 mmol/L (ref 135–145)

## 2022-12-08 LAB — GLUCOSE, CAPILLARY: Glucose-Capillary: 105 mg/dL — ABNORMAL HIGH (ref 70–99)

## 2022-12-09 LAB — HEMOGLOBIN A1C
Hgb A1c MFr Bld: 6 % — ABNORMAL HIGH (ref 4.8–5.6)
Mean Plasma Glucose: 126 mg/dL

## 2022-12-09 NOTE — Progress Notes (Signed)
Anesthesia Chart Review   Case: [id number] Date/Time: 12/17/22 0700   Procedures:      XI ROBOTIC ASSISTED LAPAROSCOPIC RADICAL PROSTATECTOMY LEVEL 2 - 210 MINUTES NEEDED FOR CASE     BILATERAL PELVIC LYMPHADENECTOMY (Bilateral)   Anesthesia type: General   Pre-op diagnosis: PROSTATE CANCER   Location: [facility] ROOM 03 / [facility]   Surgeons: Heloise Purpura, MD       DISCUSSION:67 y.o. never smoker with h/o HTN, OSA, DM II, nonobstructive CAD on cath 2022, prostate cancer scheduled for above procedure 12/17/2022 with Dr. Heloise Purpura.   Pt followed by cardiothoracic surgery, 4.0 cm ascending aortic aneurysm, stable on most recent CT 06/29/22.  Last seen 07/30/2022, yearly follow up recommended.   H/o ACDF C3-C4.   Anticipate pt can proceed with planned procedure barring acute status change.   VS: BP 113/72 Comment: right arm sitting  Pulse 73   Temp 36.6 C (Oral)   Resp 18   Ht 5\' 9"  (1.753 m)   Wt 106.1 kg   SpO2 100%   BMI 34.56 kg/m   PROVIDERS: Gareth Morgan, MD is PCP    LABS: Labs reviewed: Acceptable for surgery. (all labs ordered are listed, but only abnormal results are displayed)  Labs Reviewed  CBC - Abnormal; Notable for the following components:      Result Value   WBC 3.6 (*)    RBC 3.94 (*)    Hemoglobin 12.2 (*)    HCT 38.0 (*)    All other components within normal limits  BASIC METABOLIC PANEL - Abnormal; Notable for the following components:   CO2 21 (*)    Glucose, Bld 101 (*)    BUN 28 (*)    All other components within normal limits  HEMOGLOBIN A1C - Abnormal; Notable for the following components:   Hgb A1c MFr Bld 6.0 (*)    All other components within normal limits  GLUCOSE, CAPILLARY - Abnormal; Notable for the following components:   Glucose-Capillary 105 (*)    All other components within normal limits  TYPE AND SCREEN     IMAGES:   EKG:   CV: Cardiac Cath 05/23/2021 Assessment: 1. Mild to moderate non-obstructive CAD 2. EF  65-70% 3. Normal filling pressures 4. High cardiac output with no evidence of intracardiac shunting  Echo 05/15/2021 1. Left ventricular ejection fraction, by estimation, is 65 to 70%. The  left ventricle has normal function. The left ventricle has no regional  wall motion abnormalities. There is severe asymmetric left ventricular  hypertrophy of the septal segment. Left   ventricular diastolic parameters are consistent with Grade II diastolic  dysfunction (pseudonormalization). Elevated left ventricular end-diastolic  pressure.   2. Right ventricular systolic function is normal. The right ventricular  size is normal. There is normal pulmonary artery systolic pressure. The  estimated right ventricular systolic pressure is 30.2 mmHg.   3. There is a trivial pericardial effusion posterior to the left  ventricle.   4. The mitral valve is grossly normal. Mild mitral valve regurgitation.   5. The aortic valve is tricuspid. Aortic valve regurgitation is not  visualized. No aortic stenosis is present. Aortic valve mean gradient  measures 6.0 mmHg.   6. The inferior vena cava is normal in size with greater than 50%  respiratory variability, suggesting right atrial pressure of 3 mmHg.  Past Medical History:  Diagnosis Date   Allergic rhinitis due to pollen    on allergy shots   Anemia  Arrhythmia    1980s hospitalized for HTN and ?irregular rhythm but no specific dx but resolved during hospitalization   Coronary artery disease    Diabetes mellitus without complication (HCC)    HTN (hypertension)    Hyperlipidemia    Nausea and vomiting 08/01/2021   OSA (obstructive sleep apnea)    mild obstructive sleep apnea with an AHI of 10.8/h with oxygen saturations as low as 85%.   prostate ca 07/2022    Past Surgical History:  Procedure Laterality Date   ANTERIOR CERVICAL DECOMP/DISCECTOMY FUSION N/A 06/03/2021   Procedure: Anterior Cervical Decompression Fusion  Cervical three-four;   Surgeon: Bedelia Person, MD;  Location: [facility] OR;  Service: Neurosurgery;  Laterality: N/A;   colonoscopy  07/14/2003   Dr. Jena Gauss: normal rectum, sigmoid diverticula in colonic mucosa   COLONOSCOPY N/A 02/15/2014   Procedure: COLONOSCOPY;  Surgeon: Corbin Ade, MD;  Location[facility];  Service: Endoscopy;  Laterality: N/A;  2:00   COLONOSCOPY WITH PROPOFOL N/A 05/08/2022   Procedure: COLONOSCOPY WITH PROPOFOL;  Surgeon: Corbin Ade, MD;  Location: [facility];  Service: Endoscopy;  Laterality: N/A;  11:00 am   PROSTATE BIOPSY     RIGHT/LEFT HEART CATH AND CORONARY ANGIOGRAPHY N/A 05/23/2021   Procedure: RIGHT/LEFT HEART CATH AND CORONARY ANGIOGRAPHY;  Surgeon: Dolores Patty, MD;  Location: [facility] INVASIVE [facility] LAB;  Service: Cardiovascular;  Laterality: N/A;    MEDICATIONS:  amLODipine (NORVASC) 10 MG tablet   ascorbic acid (VITAMIN C) 500 MG tablet   aspirin 81 MG chewable tablet   atorvastatin (LIPITOR) 80 MG tablet   B Complex Vitamins (B COMPLEX 100 PO)   carvedilol (COREG) 6.25 MG tablet   celecoxib (CELEBREX) 100 MG capsule   cetirizine (ZYRTEC) 10 MG tablet   cyanocobalamin (VITAMIN B12) 1000 MCG tablet   dapagliflozin propanediol (FARXIGA) 10 MG TABS tablet   ENTRESTO 24-26 MG   ferrous sulfate 325 (65 FE) MG tablet   metFORMIN (GLUCOPHAGE) 1000 MG tablet   pantoprazole (PROTONIX) 40 MG tablet   potassium chloride SA (KLOR-CON M) 20 MEQ tablet   spironolactone (ALDACTONE) 25 MG tablet   tamsulosin (FLOMAX) 0.4 MG CAPS capsule   No current facility-administered medications for this encounter.    Jodell Cipro Ward, PA-C [facility] Pre-Surgical Testing [phone]

## 2022-12-10 DIAGNOSIS — R202 Paresthesia of skin: Secondary | ICD-10-CM

## 2022-12-10 DIAGNOSIS — R269 Unspecified abnormalities of gait and mobility: Secondary | ICD-10-CM

## 2022-12-10 DIAGNOSIS — M545 Low back pain, unspecified: Secondary | ICD-10-CM

## 2022-12-14 DIAGNOSIS — M62838 Other muscle spasm: Secondary | ICD-10-CM | POA: Diagnosis not present

## 2022-12-14 DIAGNOSIS — N393 Stress incontinence (female) (male): Secondary | ICD-10-CM | POA: Diagnosis not present

## 2022-12-14 DIAGNOSIS — M6281 Muscle weakness (generalized): Secondary | ICD-10-CM | POA: Diagnosis not present

## 2022-12-14 NOTE — Telephone Encounter (Signed)
Pt stated he would like consult sent to neurosurgery.

## 2022-12-15 ENCOUNTER — Telehealth: Payer: Self-pay | Admitting: Neurology

## 2022-12-15 ENCOUNTER — Other Ambulatory Visit: Payer: Self-pay

## 2022-12-15 ENCOUNTER — Ambulatory Visit (INDEPENDENT_AMBULATORY_CARE_PROVIDER_SITE_OTHER): Payer: BC Managed Care – PPO | Admitting: Gastroenterology

## 2022-12-15 DIAGNOSIS — D649 Anemia, unspecified: Secondary | ICD-10-CM

## 2022-12-15 LAB — IFOBT (OCCULT BLOOD): IFOBT: NEGATIVE

## 2022-12-15 NOTE — Telephone Encounter (Signed)
Referral faxed to [facility]: Phone: [phone]  Fax:[phone] 

## 2022-12-15 NOTE — Progress Notes (Signed)
RN spoke with patient to assess for any questions, concerns, or barriers prior to upcoming surgery on 6/6.   Pt denies any questions or concerns at this time, and education was provided on follow up's post op.   Plan of care in progress.  Will continue to follow.

## 2022-12-16 ENCOUNTER — Other Ambulatory Visit: Payer: Self-pay

## 2022-12-16 DIAGNOSIS — D649 Anemia, unspecified: Secondary | ICD-10-CM

## 2022-12-16 NOTE — Anesthesia Preprocedure Evaluation (Signed)
Anesthesia Evaluation  Patient identified by MRN, date of birth, ID band Patient awake    Reviewed: Allergy & Precautions, NPO status , Patient's Chart, lab work & pertinent test results  Airway Mallampati: II  TM Distance: >3 FB Neck ROM: Full   Comment: Pt w full beard Dental no notable dental hx. (+) Teeth Intact, Dental Advisory Given   Pulmonary    Pulmonary exam normal breath sounds clear to auscultation       Cardiovascular hypertension, Pt. on medications and Pt. on home beta blockers + CAD  Normal cardiovascular exam Rhythm:Regular Rate:Normal  05/2021  Echo   1. Left ventricular ejection fraction, by estimation, is 65 to 70%. The  left ventricle has normal function. The left ventricle has no regional  wall motion abnormalities. There is severe asymmetric left ventricular  hypertrophy of the septal segment. Left   ventricular diastolic parameters are consistent with Grade II diastolic  dysfunction (pseudonormalization). Elevated left ventricular end-diastolic  pressure.   2. Right ventricular systolic function is normal. The right ventricular  size is normal. There is normal pulmonary artery systolic pressure. The  estimated right ventricular systolic pressure is 30.2 mmHg.   3. There is a trivial pericardial effusion posterior to the left  ventricle.   4. The mitral valve is grossly normal. Mild mitral valve regurgitation.   5. The aortic valve is tricuspid. Aortic valve regurgitation is not  visualized. No aortic stenosis is present. Aortic valve mean gradient  measures 6.0 mmHg.   6. The inferior vena cava is normal in size with greater than 50%  respiratory variability, suggesting right atrial pressure of 3 mmHg.      Neuro/Psych negative neurological ROS  negative psych ROS   GI/Hepatic ,GERD  ,,  Endo/Other  diabetes, Type 2    Renal/GU Lab Results      Component                Value                Date                      CREATININE               1.24                12/08/2022                      K                        4.5                 12/08/2022                 Prostate CA    Musculoskeletal  (+) Arthritis ,    Abdominal  (+) + obese  Peds  Hematology Lab Results      Component                Value               Date                      WBC                      3.6 (L)  12/08/2022                HGB                      12.2 (L)            12/08/2022                HCT                      38.0 (L)            12/08/2022                PLT                      205                 12/08/2022              Anesthesia Other Findings ALL: PCN  Reproductive/Obstetrics                             Anesthesia Physical Anesthesia Plan  ASA: 3  Anesthesia Plan: General   Post-op Pain Management: Lidocaine infusion*, Ketamine IV* and Ofirmev IV (intra-op)*   Induction: Intravenous  PONV Risk Score and Plan: 3 and Ondansetron, Midazolam and Treatment may vary due to age or medical condition  Airway Management Planned: Oral ETT  Additional Equipment: None  Intra-op Plan:   Post-operative Plan: Extubation in OR  Informed Consent: I have reviewed the patients History and Physical, chart, labs and discussed the procedure including the risks, benefits and alternatives for the proposed anesthesia with the patient or authorized representative who has indicated his/her understanding and acceptance.     Dental advisory given  Plan Discussed with:   Anesthesia Plan Comments:         Anesthesia Quick Evaluation

## 2022-12-16 NOTE — H&P (Signed)
Office Visit Report     11/17/2022   --------------------------------------------------------------------------------   Andrew Arnold  MRN: [id number]  DOB: November 08, 1955, 67 year old Male  SSN:    PRIMARY CARE:     REFERRING:    PROVIDER:  Bjorn Arnold, M.D.  TREATING:  Andrew Arnold, [place]  LOCATION:  [facility] [phone]     --------------------------------------------------------------------------------   CC/HPI: Pt presents today for pre-operative history and physical exam in anticipation of robotic assisted lap radical prostatectomy with bilateral pelvic lymph node dissection by Dr. Laverle Arnold on 12/17/22. He is doing well and is without complaint.   Pt denies F/C, HA, CP, SOB, N/V, diarrhea/constipation, back pain, flank pain, hematuria, and dysuria.    HX:   CC: Prostate Cancer   Physician requesting consult: Dr. Bjorn Arnold  PCP: Dr. John Arnold  Location of consult: [facility] - [facility] Cancer [facility]   Andrew Arnold is a 67 year old gentleman with a history of GERD, OSA, diabetes, hyperlipidemia, and hypertension who was found to have an elevated PSA of 6.3. This prompted an MRI of the prostate on 06/30/22 that demonstrated a PI-RADS 4 lesion of the left base, and PI-RDS 4 lesion of the right transition zone and a large 106 cc gland with a large intravesical median lobe. He does have significant voiding symptoms and is treated with tamsulosin. He proceeded with an MR/US fusion biopsy on 07/27/22 that confirmed Gleason 4+3=7 adenocarcinoma of the prostate with all 3 ROI-1 targeted biopsies positive for predominantly pattern 4 Gleason 4+3=7 disease and a total of 11 out of 19 biopsies positive. Only 1 of the ROI-2 biopsies were positive. Conventional imaging studies were performed (due to patient having BCBS and denial of PSMA PET imaging) and they were negative for metastatic disease.   Family history: None.    Imaging studies:  MRI (06/30/22): No EPE, SVI, LAD, or bone lesions.  Bone scan (09/04/22): Negative for metastatic disease.  CT scan (09/02/22): Negative for metastatic disease.   PMH: He has a history of GERD, hypertension, hyperlipidemia, and diabetes,  PSH: No abdominal surgeries.   TNM stage: cT1c N0 Mx  PSA: 6.3  Gleason score: 4+3=7 (GG 3)  Biopsy (07/27/22): 11/19 cores positive  Left: L lateral apex (30%, 3+3=6), L apex (30%, 3+3=6), L lateral base (10%, 3+3=6)  Right: R paex (10%, 3+3=6), R mid (30%, 4+3=7, PNI), R lateral mid (10%, 3+3=6), R base (20%, 4+3=7, PNI)  Targeted: ROI-1: 3/3 cores positive (4+3=7 with 90% pattern 4 disease, 40%, 30%, 10%), ROI-2: 1/4 cores positive (3+4=7, 5%)  Prostate volume: 90 cc with large median lobe   Nomogram  OC disease: 25%  EPE: 73%  SVI: 17%  LNI: 21%  PFS (5 year, 10 year): 47%, 32%   Urinary function: IPSS is 7. He is treated with tamsulosin which has had minimal benefit. He does have a very enlarged prostate with a large intravesical median lobe.  Erectile function: SHIM score is 12. He states that he can achieve erections sometimes 2 most times and does not use medication.     ALLERGIES: Penicillin - unknown    MEDICATIONS: Aspirin  Metformin Hcl 1,000 mg tablet  Tamsulosin Hcl 0.4 mg capsule  Amlodipine Besylate 10 mg tablet  Atorvastatin Calcium 80 mg tablet  Carvedilol 6.25 mg tablet  Celecoxib 100 mg capsule  Entresto 24 mg-26 mg tablet  Farxiga 10 mg tablet  Pantoprazole Sodium 40 mg tablet, delayed release  Spironolactone  25 mg tablet     GU PSH: Prostate Needle Biopsy - 07/27/2022     NON-GU PSH: Neck Surgery (Unspecified) Surgical Pathology, Gross And Microscopic Examination For Prostate Needle - 07/27/2022     GU PMH: Prostate Cancer - 10/23/2022 Abnormal radiologic findings on diagnostic imaging of other urinary organs - 07/27/2022 Elevated PSA - 07/27/2022    NON-GU PMH: Diabetes Type  2 GERD Hypercholesterolemia Hypertension    FAMILY HISTORY: Cancer - Mother Depression - Father Diabetes - Sister, Mother Hypertension - Mother Myocardial Infarction - Father   SOCIAL HISTORY: Marital Status: Single Preferred Language: English Current Smoking Status: Patient has never smoked.   Tobacco Use Assessment Completed: Used Tobacco in last 30 days? Does not use smokeless tobacco. Has never drank.  Does not use drugs. Does not drink caffeine. Has not had a blood transfusion. 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    REVIEW OF SYSTEMS:    GU Review Male:   Patient denies frequent urination, hard to postpone urination, burning/ pain with urination, get up at night to urinate, leakage of urine, stream starts and stops, trouble starting your stream, have to strain to urinate , erection problems, and penile pain.  Gastrointestinal (Upper):   Patient denies nausea, vomiting, and indigestion/ heartburn.  Gastrointestinal (Lower):   Patient denies diarrhea and constipation.  Constitutional:   Patient denies fever, night sweats, weight loss, and fatigue.  Skin:   Patient denies skin rash/ lesion and itching.  Eyes:   Patient denies blurred vision and double vision.  Ears/ Nose/ Throat:   Patient denies sore throat and sinus problems.  Hematologic/Lymphatic:   Patient denies easy bruising and swollen glands.  Cardiovascular:   Patient denies leg swelling and chest pains.  Respiratory:   Patient denies cough and shortness of breath.  Endocrine:   Patient denies excessive thirst.  Musculoskeletal:   Patient denies back pain and joint pain.  Neurological:   Patient denies headaches and dizziness.  Psychologic:   Patient denies depression and anxiety.   VITAL SIGNS:      11/17/2022 03:16 PM  Weight 238 lb / 107.95 kg  Height 69 in / 175.26 cm  BP 114/71 mmHg  Pulse 73 /min  Temperature 96.9 F / 36.0 C  BMI 35.1 kg/m   MULTI-SYSTEM PHYSICAL EXAMINATION:     Constitutional: Well-nourished. No physical deformities. Normally developed. Good grooming.  Neck: Neck symmetrical, not swollen. Normal tracheal position.  Respiratory: Normal breath sounds. No labored breathing, no use of accessory muscles.   Cardiovascular: Regular rate and rhythm. No murmur, no gallop.   Lymphatic: No enlargement of neck, axillae, groin.  Skin: No paleness, no jaundice, no cyanosis. No lesion, no ulcer, no rash.  Neurologic / Psychiatric: Oriented to time, oriented to place, oriented to person. No depression, no anxiety, no agitation.  Gastrointestinal: No mass, no tenderness, no rigidity, non obese abdomen.  Eyes: Normal conjunctivae. Normal eyelids.  Ears, Nose, Mouth, and Throat: Left ear no scars, no lesions, no masses. Right ear no scars, no lesions, no masses. Nose no scars, no lesions, no masses. Normal hearing. Normal lips.  Musculoskeletal: Normal gait and station of head and neck.     Complexity of Data:  Records Review:   Previous Patient Records  Urine Test Review:   Urinalysis   11/17/22  Urinalysis  Urine Appearance Clear   Urine Color Yellow   Urine Glucose 3+ mg/dL  Urine Bilirubin Neg mg/dL  Urine Ketones Neg mg/dL  Urine Specific Gravity 1.020  Urine Blood Neg ery/uL  Urine pH <=5.0   Urine Protein Neg mg/dL  Urine Urobilinogen 0.2 mg/dL  Urine Nitrites Neg   Urine Leukocyte Esterase Trace leu/uL  Urine WBC/hpf 0 - 5/hpf   Urine RBC/hpf NS (Not Seen)   Urine Epithelial Cells NS (Not Seen)   Urine Bacteria Few (10-25/hpf)   Urine Mucous Not Present   Urine Yeast NS (Not Seen)   Urine Trichomonas Not Present   Urine Cystals NS (Not Seen)   Urine Casts NS (Not Seen)   Urine Sperm Not Present    PROCEDURES:          Urinalysis w/Scope - 81001 Dipstick Dipstick Cont'd Micro  Color: Yellow Bilirubin: Neg mg/dL WBC/hpf: 0 - 5/hpf  Appearance: Clear Ketones: Neg mg/dL RBC/hpf: NS (Not Seen)  Specific Gravity: 1.020 Blood: Neg ery/uL  Bacteria: Few (10-25/hpf)  pH: <=5.0 Protein: Neg mg/dL Cystals: NS (Not Seen)  Glucose: 3+ mg/dL Urobilinogen: 0.2 mg/dL Casts: NS (Not Seen)    Nitrites: Neg Trichomonas: Not Present    Leukocyte Esterase: Trace leu/uL Mucous: Not Present      Epithelial Cells: NS (Not Seen)      Yeast: NS (Not Seen)      Sperm: Not Present    ASSESSMENT:      ICD-10 Details  1 GU:   Prostate Cancer - C61    PLAN:           Orders Labs Urine Culture          Schedule Return Visit/Planned Activity: Keep Scheduled Appointment - Schedule Surgery          Document Letter(s):  Created for Patient: Clinical Summary         Notes:   There are no changes in the patients history or physical exam since last evaluation by Dr. Laverle Arnold. Pt is scheduled to undergo RALP with BPLND on 12/17/22.   Urine for culture.   All pt's questions were answered to the best of my ability.          Next Appointment:      Next Appointment: 11/30/2022 04:00 PM    Appointment Type: 60 Minute PT Pre-Op    Location: [facility] [phone]    Provider: Jeanice Lim      * Signed by Andrew Amor, PA on 11/17/22 at 3:24 PM (EDT)*

## 2022-12-17 ENCOUNTER — Ambulatory Visit (HOSPITAL_COMMUNITY): Payer: BC Managed Care – PPO | Admitting: Certified Registered Nurse Anesthetist

## 2022-12-17 ENCOUNTER — Encounter (HOSPITAL_COMMUNITY)
Admission: RE | Disposition: A | Discharge status: Home / Self Care | Payer: Self-pay | Source: Ambulatory Visit | Attending: Urology

## 2022-12-17 ENCOUNTER — Other Ambulatory Visit: Payer: Self-pay

## 2022-12-17 ENCOUNTER — Encounter (HOSPITAL_COMMUNITY): Payer: Self-pay | Admitting: Urology

## 2022-12-17 ENCOUNTER — Observation Stay (HOSPITAL_COMMUNITY)
Admission: RE | Admit: 2022-12-17 | Discharge: 2022-12-18 | Disposition: A | Discharge status: Home / Self Care | Payer: BC Managed Care – PPO | Source: Ambulatory Visit | Attending: Urology | Admitting: Urology

## 2022-12-17 ENCOUNTER — Ambulatory Visit (HOSPITAL_COMMUNITY): Payer: BC Managed Care – PPO | Admitting: Physician Assistant

## 2022-12-17 DIAGNOSIS — Z7984 Long term (current) use of oral hypoglycemic drugs: Secondary | ICD-10-CM | POA: Insufficient documentation

## 2022-12-17 DIAGNOSIS — E119 Type 2 diabetes mellitus without complications: Secondary | ICD-10-CM

## 2022-12-17 DIAGNOSIS — I10 Essential (primary) hypertension: Secondary | ICD-10-CM | POA: Diagnosis not present

## 2022-12-17 DIAGNOSIS — C61 Malignant neoplasm of prostate: Principal | ICD-10-CM | POA: Diagnosis present

## 2022-12-17 DIAGNOSIS — D360 Benign neoplasm of lymph nodes: Secondary | ICD-10-CM | POA: Diagnosis not present

## 2022-12-17 DIAGNOSIS — Z7982 Long term (current) use of aspirin: Secondary | ICD-10-CM | POA: Insufficient documentation

## 2022-12-17 DIAGNOSIS — I2510 Atherosclerotic heart disease of native coronary artery without angina pectoris: Secondary | ICD-10-CM | POA: Diagnosis not present

## 2022-12-17 DIAGNOSIS — Z79899 Other long term (current) drug therapy: Secondary | ICD-10-CM | POA: Insufficient documentation

## 2022-12-17 DIAGNOSIS — Z01818 Encounter for other preprocedural examination: Secondary | ICD-10-CM

## 2022-12-17 HISTORY — PX: ROBOT ASSISTED LAPAROSCOPIC RADICAL PROSTATECTOMY: SHX5141

## 2022-12-17 HISTORY — PX: LYMPHADENECTOMY: SHX5960

## 2022-12-17 LAB — HEMOGLOBIN AND HEMATOCRIT, BLOOD
HCT: 35.5 % — ABNORMAL LOW (ref 39.0–52.0)
Hemoglobin: 11.9 g/dL — ABNORMAL LOW (ref 13.0–17.0)

## 2022-12-17 LAB — TYPE AND SCREEN
ABO/RH(D): A POS
Antibody Screen: NEGATIVE

## 2022-12-17 LAB — GLUCOSE, CAPILLARY
Glucose-Capillary: 135 mg/dL — ABNORMAL HIGH (ref 70–99)
Glucose-Capillary: 125 mg/dL — ABNORMAL HIGH (ref 70–99)
Glucose-Capillary: 139 mg/dL — ABNORMAL HIGH (ref 70–99)
Glucose-Capillary: 111 mg/dL — ABNORMAL HIGH (ref 70–99)

## 2022-12-17 SURGERY — XI ROBOTIC ASSISTED LAPAROSCOPIC RADICAL PROSTATECTOMY LEVEL 2
Anesthesia: General | Site: Abdomen

## 2022-12-17 MED ORDER — SODIUM CHLORIDE 0.9 % IR SOLN
Status: DC | PRN
  Administered 2022-12-17: 1000 mL via INTRAVESICAL

## 2022-12-17 MED ORDER — BUPIVACAINE HCL 0.25 % IJ SOLN
INTRAMUSCULAR | Status: DC | PRN
  Administered 2022-12-17: 40 mL

## 2022-12-17 MED ORDER — CEFAZOLIN SODIUM-DEXTROSE 2-4 GM/100ML-% IV SOLN
2.0000 g | Freq: Once | INTRAVENOUS | Status: AC
  Administered 2022-12-17: 2 g via INTRAVENOUS
  Filled 2022-12-17: qty 100

## 2022-12-17 MED ORDER — ONDANSETRON HCL 4 MG/2ML IJ SOLN
INTRAMUSCULAR | Status: DC | PRN
  Administered 2022-12-17: 4 mg via INTRAVENOUS

## 2022-12-17 MED ORDER — LIDOCAINE HCL (CARDIAC) PF 100 MG/5ML IV SOSY
PREFILLED_SYRINGE | INTRAVENOUS | Status: DC | PRN
  Administered 2022-12-17: 100 mg via INTRAVENOUS

## 2022-12-17 MED ORDER — ACETAMINOPHEN 10 MG/ML IV SOLN
INTRAVENOUS | Status: DC | PRN
  Administered 2022-12-17: 1000 mg via INTRAVENOUS

## 2022-12-17 MED ORDER — KETAMINE HCL 10 MG/ML IJ SOLN
INTRAMUSCULAR | Status: DC | PRN
  Administered 2022-12-17 (×3): 10 mg via INTRAVENOUS

## 2022-12-17 MED ORDER — SPIRONOLACTONE 12.5 MG HALF TABLET
12.5000 mg | ORAL_TABLET | Freq: Every day | ORAL | Status: DC
  Administered 2022-12-18: 12.5 mg via ORAL
  Filled 2022-12-17: qty 1

## 2022-12-17 MED ORDER — KETAMINE HCL 50 MG/5ML IJ SOSY
PREFILLED_SYRINGE | INTRAMUSCULAR | Status: AC
  Filled 2022-12-17: qty 5

## 2022-12-17 MED ORDER — ROCURONIUM BROMIDE 10 MG/ML (PF) SYRINGE
PREFILLED_SYRINGE | INTRAVENOUS | Status: AC
  Filled 2022-12-17: qty 10

## 2022-12-17 MED ORDER — PANTOPRAZOLE SODIUM 40 MG PO TBEC
40.0000 mg | DELAYED_RELEASE_TABLET | Freq: Every day | ORAL | Status: DC
  Administered 2022-12-18: 40 mg via ORAL
  Filled 2022-12-17: qty 1

## 2022-12-17 MED ORDER — HYOSCYAMINE SULFATE 0.125 MG SL SUBL: 0.1250 mg | SUBLINGUAL_TABLET | Freq: Four times a day (QID) | SUBLINGUAL | Status: DC | PRN

## 2022-12-17 MED ORDER — ACETAMINOPHEN 10 MG/ML IV SOLN
INTRAVENOUS | Status: AC
  Filled 2022-12-17: qty 100

## 2022-12-17 MED ORDER — LACTATED RINGERS IV SOLN: INTRAVENOUS | Status: DC | PRN

## 2022-12-17 MED ORDER — HYDROMORPHONE HCL 1 MG/ML IJ SOLN
INTRAMUSCULAR | Status: AC
  Filled 2022-12-17: qty 1

## 2022-12-17 MED ORDER — SACUBITRIL-VALSARTAN 24-26 MG PO TABS
1.0000 | ORAL_TABLET | Freq: Two times a day (BID) | ORAL | Status: DC
  Administered 2022-12-17 – 2022-12-18 (×2): 1 via ORAL
  Filled 2022-12-17 (×2): qty 1

## 2022-12-17 MED ORDER — ONDANSETRON HCL 4 MG/2ML IJ SOLN: 4.0000 mg | Freq: Once | INTRAMUSCULAR | Status: DC | PRN

## 2022-12-17 MED ORDER — OXYCODONE HCL 5 MG/5ML PO SOLN: 5.0000 mg | Freq: Once | ORAL | Status: DC | PRN

## 2022-12-17 MED ORDER — ZOLPIDEM TARTRATE 5 MG PO TABS: 5.0000 mg | ORAL_TABLET | Freq: Every evening | ORAL | Status: DC | PRN

## 2022-12-17 MED ORDER — CHLORHEXIDINE GLUCONATE CLOTH 2 % EX PADS
6.0000 | MEDICATED_PAD | Freq: Every day | CUTANEOUS | Status: DC
  Administered 2022-12-18: 6 via TOPICAL

## 2022-12-17 MED ORDER — LACTATED RINGERS IV SOLN: INTRAVENOUS | Status: DC

## 2022-12-17 MED ORDER — SULFAMETHOXAZOLE-TRIMETHOPRIM 800-160 MG PO TABS: 1.0000 | ORAL_TABLET | Freq: Two times a day (BID) | ORAL | 0 refills | Status: DC

## 2022-12-17 MED ORDER — TRIPLE ANTIBIOTIC 3.5-400-5000 EX OINT: 1.0000 | TOPICAL_OINTMENT | Freq: Three times a day (TID) | CUTANEOUS | Status: DC | PRN

## 2022-12-17 MED ORDER — MAGNESIUM CITRATE PO SOLN
1.0000 | Freq: Once | ORAL | Status: DC
  Filled 2022-12-17: qty 296

## 2022-12-17 MED ORDER — FENTANYL CITRATE (PF) 100 MCG/2ML IJ SOLN
INTRAMUSCULAR | Status: AC
  Filled 2022-12-17: qty 2

## 2022-12-17 MED ORDER — MIDAZOLAM HCL 2 MG/2ML IJ SOLN
INTRAMUSCULAR | Status: AC
  Filled 2022-12-17: qty 2

## 2022-12-17 MED ORDER — PROPOFOL 10 MG/ML IV BOLUS
INTRAVENOUS | Status: AC
  Filled 2022-12-17: qty 20

## 2022-12-17 MED ORDER — PHENYLEPHRINE HCL-NACL 20-0.9 MG/250ML-% IV SOLN
INTRAVENOUS | Status: DC | PRN
  Administered 2022-12-17: 50 ug/min via INTRAVENOUS

## 2022-12-17 MED ORDER — HYDROMORPHONE HCL 1 MG/ML IJ SOLN
0.2500 mg | INTRAMUSCULAR | Status: DC | PRN
  Administered 2022-12-17 (×4): 0.5 mg via INTRAVENOUS

## 2022-12-17 MED ORDER — ACETAMINOPHEN 325 MG PO TABS: 650.0000 mg | ORAL_TABLET | ORAL | Status: DC | PRN

## 2022-12-17 MED ORDER — CARVEDILOL 6.25 MG PO TABS
6.2500 mg | ORAL_TABLET | Freq: Two times a day (BID) | ORAL | Status: DC
  Administered 2022-12-17 – 2022-12-18 (×2): 6.25 mg via ORAL
  Filled 2022-12-17 (×2): qty 1

## 2022-12-17 MED ORDER — PHENYLEPHRINE HCL (PRESSORS) 10 MG/ML IV SOLN
INTRAVENOUS | Status: DC | PRN
  Administered 2022-12-17 (×8): 160 ug via INTRAVENOUS

## 2022-12-17 MED ORDER — ROCURONIUM BROMIDE 100 MG/10ML IV SOLN
INTRAVENOUS | Status: DC | PRN
  Administered 2022-12-17 (×3): 20 mg via INTRAVENOUS
  Administered 2022-12-17: 60 mg via INTRAVENOUS

## 2022-12-17 MED ORDER — OXYCODONE HCL 5 MG PO TABS: 5.0000 mg | ORAL_TABLET | Freq: Once | ORAL | Status: DC | PRN

## 2022-12-17 MED ORDER — ONDANSETRON HCL 4 MG/2ML IJ SOLN
INTRAMUSCULAR | Status: AC
  Filled 2022-12-17: qty 2

## 2022-12-17 MED ORDER — TRAMADOL HCL 50 MG PO TABS: 50.0000 mg | ORAL_TABLET | Freq: Four times a day (QID) | ORAL | 0 refills | Status: DC | PRN

## 2022-12-17 MED ORDER — LIDOCAINE HCL (PF) 2 % IJ SOLN
INTRAMUSCULAR | Status: AC
  Filled 2022-12-17: qty 10

## 2022-12-17 MED ORDER — AMLODIPINE BESYLATE 10 MG PO TABS
10.0000 mg | ORAL_TABLET | Freq: Every day | ORAL | Status: DC
  Administered 2022-12-17 – 2022-12-18 (×2): 10 mg via ORAL
  Filled 2022-12-17 (×2): qty 1

## 2022-12-17 MED ORDER — ONDANSETRON HCL 4 MG/2ML IJ SOLN: 4.0000 mg | INTRAMUSCULAR | Status: DC | PRN

## 2022-12-17 MED ORDER — INSULIN ASPART 100 UNIT/ML IJ SOLN
0.0000 [IU] | INTRAMUSCULAR | Status: DC
  Administered 2022-12-17 (×2): 2 [IU] via SUBCUTANEOUS

## 2022-12-17 MED ORDER — DIPHENHYDRAMINE HCL 12.5 MG/5ML PO ELIX: 12.5000 mg | ORAL_SOLUTION | Freq: Four times a day (QID) | ORAL | Status: DC | PRN

## 2022-12-17 MED ORDER — DOCUSATE SODIUM 100 MG PO CAPS
100.0000 mg | ORAL_CAPSULE | Freq: Two times a day (BID) | ORAL | Status: DC
  Administered 2022-12-18: 100 mg via ORAL
  Filled 2022-12-17 (×2): qty 1

## 2022-12-17 MED ORDER — DIPHENHYDRAMINE HCL 50 MG/ML IJ SOLN: 12.5000 mg | Freq: Four times a day (QID) | INTRAMUSCULAR | Status: DC | PRN

## 2022-12-17 MED ORDER — ACETAMINOPHEN 10 MG/ML IV SOLN: 1000.0000 mg | Freq: Once | INTRAVENOUS | Status: DC | PRN

## 2022-12-17 MED ORDER — MORPHINE SULFATE (PF) 2 MG/ML IV SOLN: 2.0000 mg | INTRAVENOUS | Status: DC | PRN

## 2022-12-17 MED ORDER — SODIUM CHLORIDE 0.9 % IV BOLUS
1000.0000 mL | Freq: Once | INTRAVENOUS | Status: AC
  Administered 2022-12-17: 1000 mL via INTRAVENOUS

## 2022-12-17 MED ORDER — PHENYLEPHRINE HCL-NACL 20-0.9 MG/250ML-% IV SOLN
INTRAVENOUS | Status: AC
  Filled 2022-12-17: qty 250

## 2022-12-17 MED ORDER — STERILE WATER FOR IRRIGATION IR SOLN
Status: DC | PRN
  Administered 2022-12-17: 1000 mL

## 2022-12-17 MED ORDER — HEPARIN SODIUM (PORCINE) 1000 UNIT/ML IJ SOLN
INTRAMUSCULAR | Status: AC
  Filled 2022-12-17: qty 1

## 2022-12-17 MED ORDER — CHLORHEXIDINE GLUCONATE 0.12 % MT SOLN
15.0000 mL | Freq: Once | OROMUCOSAL | Status: AC
  Administered 2022-12-17: 15 mL via OROMUCOSAL

## 2022-12-17 MED ORDER — BUPIVACAINE-EPINEPHRINE 0.25% -1:200000 IJ SOLN: INTRAMUSCULAR | Status: DC | PRN

## 2022-12-17 MED ORDER — SPIRONOLACTONE 25 MG PO TABS: 25.0000 mg | ORAL_TABLET | Freq: Once | ORAL | Status: DC

## 2022-12-17 MED ORDER — LIDOCAINE HCL (PF) 2 % IJ SOLN
INTRAMUSCULAR | Status: DC | PRN
  Administered 2022-12-17: 1.5 mg/kg/h via INTRADERMAL

## 2022-12-17 MED ORDER — CEFAZOLIN SODIUM-DEXTROSE 1-4 GM/50ML-% IV SOLN
1.0000 g | Freq: Three times a day (TID) | INTRAVENOUS | Status: AC
  Administered 2022-12-17 (×2): 1 g via INTRAVENOUS
  Filled 2022-12-17 (×2): qty 50

## 2022-12-17 MED ORDER — BUPIVACAINE HCL 0.25 % IJ SOLN
INTRAMUSCULAR | Status: AC
  Filled 2022-12-17: qty 1

## 2022-12-17 MED ORDER — HYDROMORPHONE HCL 1 MG/ML IJ SOLN
0.2500 mg | INTRAMUSCULAR | Status: DC | PRN
  Administered 2022-12-17 (×2): 0.5 mg via INTRAVENOUS

## 2022-12-17 MED ORDER — SUGAMMADEX SODIUM 200 MG/2ML IV SOLN
INTRAVENOUS | Status: DC | PRN
  Administered 2022-12-17: 200 mg via INTRAVENOUS

## 2022-12-17 MED ORDER — ATORVASTATIN CALCIUM 40 MG PO TABS
80.0000 mg | ORAL_TABLET | Freq: Every day | ORAL | Status: DC
  Administered 2022-12-17: 80 mg via ORAL
  Filled 2022-12-17: qty 2

## 2022-12-17 MED ORDER — MIDAZOLAM HCL 5 MG/5ML IJ SOLN
INTRAMUSCULAR | Status: DC | PRN
  Administered 2022-12-17: 1 mg via INTRAVENOUS

## 2022-12-17 MED ORDER — FENTANYL CITRATE (PF) 100 MCG/2ML IJ SOLN
INTRAMUSCULAR | Status: DC | PRN
  Administered 2022-12-17 (×2): 50 ug via INTRAVENOUS
  Administered 2022-12-17: 25 ug via INTRAVENOUS

## 2022-12-17 MED ORDER — DOCUSATE SODIUM 100 MG PO CAPS: 100.0000 mg | ORAL_CAPSULE | Freq: Two times a day (BID) | ORAL | Status: DC

## 2022-12-17 MED ORDER — LORATADINE 10 MG PO TABS
10.0000 mg | ORAL_TABLET | Freq: Every day | ORAL | Status: DC
  Administered 2022-12-17 – 2022-12-18 (×2): 10 mg via ORAL
  Filled 2022-12-17 (×2): qty 1

## 2022-12-17 MED ORDER — PROPOFOL 10 MG/ML IV BOLUS
INTRAVENOUS | Status: DC | PRN
  Administered 2022-12-17: 160 mg via INTRAVENOUS

## 2022-12-17 MED ORDER — DEXAMETHASONE SODIUM PHOSPHATE 10 MG/ML IJ SOLN
INTRAMUSCULAR | Status: AC
  Filled 2022-12-17: qty 1

## 2022-12-17 MED ORDER — POTASSIUM CHLORIDE IN NACL 20-0.45 MEQ/L-% IV SOLN
INTRAVENOUS | Status: DC
  Filled 2022-12-17 (×3): qty 1000

## 2022-12-17 MED ORDER — DEXAMETHASONE SODIUM PHOSPHATE 4 MG/ML IJ SOLN
INTRAMUSCULAR | Status: DC | PRN
  Administered 2022-12-17: 5 mg via INTRAVENOUS

## 2022-12-17 MED ORDER — PHENYLEPHRINE 80 MCG/ML (10ML) SYRINGE FOR IV PUSH (FOR BLOOD PRESSURE SUPPORT)
PREFILLED_SYRINGE | INTRAVENOUS | Status: AC
  Filled 2022-12-17: qty 10

## 2022-12-17 MED ORDER — FLEET ENEMA 7-19 GM/118ML RE ENEM: 1.0000 | ENEMA | Freq: Once | RECTAL | Status: DC

## 2022-12-17 MED ORDER — ORAL CARE MOUTH RINSE: 15.0000 mL | Freq: Once | OROMUCOSAL | Status: AC

## 2022-12-17 MED ORDER — LIDOCAINE HCL (PF) 2 % IJ SOLN
INTRAMUSCULAR | Status: AC
  Filled 2022-12-17: qty 5

## 2022-12-17 MED ORDER — KETOROLAC TROMETHAMINE 15 MG/ML IJ SOLN
15.0000 mg | Freq: Four times a day (QID) | INTRAMUSCULAR | Status: DC
  Administered 2022-12-17 – 2022-12-18 (×4): 15 mg via INTRAVENOUS
  Filled 2022-12-17 (×4): qty 1

## 2022-12-17 SURGICAL SUPPLY — 67 items
ADH SKN CLS APL DERMABOND .7 (GAUZE/BANDAGES/DRESSINGS) ×2
APL PRP STRL LF DISP 70% ISPRP (MISCELLANEOUS) ×2
APL SWBSTK 6 STRL LF DISP (MISCELLANEOUS) ×2
APPLICATOR COTTON TIP 6 STRL (MISCELLANEOUS) ×3 IMPLANT
APPLICATOR COTTON TIP 6IN STRL (MISCELLANEOUS) ×2
BAG COUNTER SPONGE SURGICOUNT (BAG) IMPLANT
BAG SPNG CNTER NS LX DISP (BAG)
CATH FOLEY 2WAY SLVR 18FR 30CC (CATHETERS) ×3 IMPLANT
CATH ROBINSON RED A/P 16FR (CATHETERS) ×3 IMPLANT
CATH ROBINSON RED A/P 8FR (CATHETERS) ×3 IMPLANT
CATH TIEMANN FOLEY 18FR 5CC (CATHETERS) ×3 IMPLANT
CHLORAPREP W/TINT 26 (MISCELLANEOUS) ×3 IMPLANT
CLIP LIGATING HEM O LOK PURPLE (MISCELLANEOUS) ×3 IMPLANT
COVER SURGICAL LIGHT HANDLE (MISCELLANEOUS) ×3 IMPLANT
COVER TIP SHEARS 8 DVNC (MISCELLANEOUS) ×3 IMPLANT
CUTTER ECHEON FLEX ENDO 45 340 (ENDOMECHANICALS) ×3 IMPLANT
DERMABOND ADVANCED .7 DNX12 (GAUZE/BANDAGES/DRESSINGS) ×3 IMPLANT
DRAIN CHANNEL RND F F (WOUND CARE) IMPLANT
DRAPE ARM DVNC X/XI (DISPOSABLE) ×12 IMPLANT
DRAPE COLUMN DVNC XI (DISPOSABLE) ×3 IMPLANT
DRAPE SURG IRRIG POUCH 19X23 (DRAPES) ×3 IMPLANT
DRIVER NDL LRG 8 DVNC XI (INSTRUMENTS) ×6 IMPLANT
DRIVER NDLE LRG 8 DVNC XI (INSTRUMENTS) ×4 IMPLANT
DRSG TEGADERM 4X4.75 (GAUZE/BANDAGES/DRESSINGS) ×3 IMPLANT
ELECT PENCIL ROCKER SW 15FT (MISCELLANEOUS) ×3 IMPLANT
ELECT REM PT RETURN 15FT ADLT (MISCELLANEOUS) ×3 IMPLANT
FORCEPS BPLR LNG DVNC XI (INSTRUMENTS) ×3 IMPLANT
FORCEPS PROGRASP DVNC XI (FORCEP) ×3 IMPLANT
GAUZE 4X4 16PLY ~~LOC~~+RFID DBL (SPONGE) ×3 IMPLANT
GAUZE SPONGE 4X4 12PLY STRL (GAUZE/BANDAGES/DRESSINGS) ×3 IMPLANT
GLOVE BIO SURGEON STRL SZ 6.5 (GLOVE) ×3 IMPLANT
GLOVE SURG LX STRL 7.5 STRW (GLOVE) ×6 IMPLANT
GOWN SRG XL LVL 4 BRTHBL STRL (GOWNS) ×3 IMPLANT
GOWN STRL NON-REIN XL LVL4 (GOWNS) ×2
GOWN STRL REUS W/ TWL XL LVL3 (GOWN DISPOSABLE) ×6 IMPLANT
GOWN STRL REUS W/TWL XL LVL3 (GOWN DISPOSABLE) ×4
HOLDER FOLEY CATH W/STRAP (MISCELLANEOUS) ×3 IMPLANT
IRRIG SUCT STRYKERFLOW 2 WTIP (MISCELLANEOUS) ×2
IRRIGATION SUCT STRKRFLW 2 WTP (MISCELLANEOUS) ×3 IMPLANT
IV LACTATED RINGERS 1000ML (IV SOLUTION) ×3 IMPLANT
KIT TURNOVER KIT A (KITS) IMPLANT
NDL SAFETY ECLIP 18X1.5 (MISCELLANEOUS) ×3 IMPLANT
PACK ROBOT UROLOGY CUSTOM (CUSTOM PROCEDURE TRAY) ×3 IMPLANT
RELOAD STAPLE 45 4.1 GRN THCK (STAPLE) ×3 IMPLANT
SCISSORS MNPLR CVD DVNC XI (INSTRUMENTS) ×3 IMPLANT
SEAL UNIV 5-12 XI (MISCELLANEOUS) ×9 IMPLANT
SET CYSTO W/LG BORE CLAMP LF (SET/KITS/TRAYS/PACK) IMPLANT
SET TUBE SMOKE EVAC HIGH FLOW (TUBING) ×3 IMPLANT
SOL ELECTROSURG ANTI STICK (MISCELLANEOUS) ×2
SOLUTION ELECTROSURG ANTI STCK (MISCELLANEOUS) ×3 IMPLANT
SPIKE FLUID TRANSFER (MISCELLANEOUS) ×3 IMPLANT
STAPLE RELOAD 45 GRN (STAPLE) ×2 IMPLANT
STAPLE RELOAD 45MM GREEN (STAPLE) ×2
SUT ETHILON 3 0 PS 1 (SUTURE) ×3 IMPLANT
SUT MNCRL 3 0 RB1 (SUTURE) ×3 IMPLANT
SUT MNCRL 3 0 VIOLET RB1 (SUTURE) ×3 IMPLANT
SUT MNCRL AB 4-0 PS2 18 (SUTURE) ×6 IMPLANT
SUT PDS PLUS AB 0 CT-2 (SUTURE) ×6 IMPLANT
SUT VIC AB 0 CT1 27 (SUTURE) ×4
SUT VIC AB 0 CT1 27XBRD ANTBC (SUTURE) ×6 IMPLANT
SUT VIC AB 2-0 SH 27 (SUTURE) ×2
SUT VIC AB 2-0 SH 27X BRD (SUTURE) ×3 IMPLANT
SYR 27GX1/2 1ML LL SAFETY (SYRINGE) ×3 IMPLANT
TOWEL OR NON WOVEN STRL DISP B (DISPOSABLE) ×3 IMPLANT
TROCAR Z THREAD OPTICAL 12X100 (TROCAR) IMPLANT
TROCAR Z-THREAD FIOS 5X100MM (TROCAR) IMPLANT
WATER STERILE IRR 1000ML POUR (IV SOLUTION) ×3 IMPLANT

## 2022-12-17 NOTE — Discharge Instructions (Addendum)
Activity:  You are encouraged to ambulate frequently (about every hour during waking hours) to help prevent blood clots from forming in your legs or lungs.  However, you should not engage in any heavy lifting (> 10-15 lbs), strenuous activity, or straining. Diet: You should continue a clear liquid diet until passing gas from below.  Once this occurs, you may advance your diet to a soft diet that would be easy to digest (i.e soups, scrambled eggs, mashed potatoes, etc.) for 24 hours just as you would if getting over a bad stomach flu.  If tolerating this diet well for 24 hours, you may then begin eating regular food.  It will be normal to have some amount of bloating, nausea, and abdominal discomfort intermittently. Prescriptions:  You will be provided a prescription for pain medication to take as needed.  If your pain is not severe enough to require the prescription pain medication, you may take Tylenol instead.  You should also take an over the counter stool softener (Colace 100 mg twice daily) to avoid straining with bowel movements as the pain medication may constipate you. Finally, you will also be provided a prescription for an antibiotic to begin the day prior to your return visit in the office for catheter removal. Catheter care: You will be taught how to take care of the catheter by the nursing staff prior to discharge from the hospital.  You may use both a leg bag and the larger bedside bag but it is recommended to at least use the bigger bedside bag at nighttime as the leg bag is small and will fill up overnight and also does not drain as well when lying flat. You may periodically feel a strong urge to void with the catheter in place.  This is a bladder spasm and most often can occur when having a bowel movement or when you are moving around. It is typically self-limited and usually will stop after a few minutes.  You may use some Vaseline or Neosporin around the tip of the catheter to reduce friction  at the tip of the penis. Incisions: You may remove your dressing bandages the 2nd day after surgery.  You most likely will have a few small staples in each of the incisions and once the bandages are removed, the incisions may stay open to air.  You may start showering (not soaking or bathing in water) 48 hours after surgery and the incisions simply need to be patted dry after the shower.  No additional care is needed. What to call us about: You should call the office [phone]) if you develop fever > 101, persistent vomiting, or the catheter stops draining. Also, feel free to call with any other questions you may have and remember the handout that was provided to you as a reference preoperatively which answers many of the common questions that arise after surgery. You may resume aspirin, advil, aleve, vitamins, and supplements 7 days after surgery.   You may resume aspirin, advil, aleve, celebrex, vitamins, and supplements 7 days after surgery.

## 2022-12-17 NOTE — Progress Notes (Signed)
Patient ID: Andrew Arnold, male   DOB: 10-17-1955, 67 y.o.   MRN: [id number]  Post-op note  Subjective: The patient is doing well.  No complaints.  Objective: Vital signs in last 24 hours: Temp:  [95.4 F (35.2 C)-97.9 F (36.6 C)] 97.9 F (36.6 C) (06/06 1508) Pulse Rate:  [51-79] 69 (06/06 1508) Resp:  [9-22] 19 (06/06 1508) BP: (107-143)/(75-98) 127/80 (06/06 1508) SpO2:  [96 %-100 %] 100 % (06/06 1508) Weight:  [106 kg] 106 kg (06/06 0554)  Intake/Output from previous day: No intake/output data recorded. Intake/Output this shift: Total I/O In: 2400 [I.V.:2300; IV Piggyback:100] Out: 700 [Urine:400; Drains:60; Blood:240]  Physical Exam:  General: Alert and oriented. Abdomen: Soft, Nondistended. Incisions: Clean and dry. GU: Urine clearing.  Lab Results: Recent Labs    12/17/22 1559  HGB 11.9*  HCT 35.5*    Assessment/Plan: POD#0   1) Continue to monitor, ambulate, IS   Moody Bruins. MD   LOS: 0 days   Crecencio Mc 12/17/2022, 4:21 PM

## 2022-12-17 NOTE — Anesthesia Postprocedure Evaluation (Signed)
Anesthesia Post Note  Patient: Andrew Arnold  Procedure(s) Performed: XI ROBOTIC ASSISTED LAPAROSCOPIC RADICAL PROSTATECTOMY LEVEL 2 (Abdomen) BILATERAL PELVIC LYMPHADENECTOMY (Bilateral)     Patient location during evaluation: PACU Anesthesia Type: General Level of consciousness: awake and alert Pain management: pain level controlled Vital Signs Assessment: post-procedure vital signs reviewed and stable Respiratory status: spontaneous breathing, nonlabored ventilation, respiratory function stable and patient connected to nasal cannula oxygen Cardiovascular status: blood pressure returned to baseline and stable Postop Assessment: no apparent nausea or vomiting Anesthetic complications: no   No notable events documented.  Last Vitals:  Vitals:   12/17/22 1330 12/17/22 1400  BP: 122/75 107/79  Pulse: 69 71  Resp: 11 (!) 9  Temp: (!) 36.3 C   SpO2: 98% 97%    Last Pain:  Vitals:   12/17/22 1400  TempSrc:   PainSc: Asleep                 Trevor Iha

## 2022-12-17 NOTE — Transfer of Care (Signed)
Immediate Anesthesia Transfer of Care Note  Patient: Andrew Arnold  Procedure(s) Performed: XI ROBOTIC ASSISTED LAPAROSCOPIC RADICAL PROSTATECTOMY LEVEL 2 (Abdomen) BILATERAL PELVIC LYMPHADENECTOMY (Bilateral)  Patient Location: PACU  Anesthesia Type:General  Level of Consciousness: awake, alert , and oriented  Airway & Oxygen Therapy: Patient Spontanous Breathing and Patient connected to face mask oxygen  Post-op Assessment: Report given to RN and Post -op Vital signs reviewed and stable  Post vital signs: Reviewed and stable  Last Vitals:  Vitals Value Taken Time  BP 131/82 12/17/22 1211  Temp    Pulse 53 12/17/22 1214  Resp 21 12/17/22 1214  SpO2 96 % 12/17/22 1214  Vitals shown include unvalidated device data.  Last Pain:  Vitals:   12/17/22 0554  TempSrc: Oral  PainSc: 0-No pain         Complications: No notable events documented.

## 2022-12-17 NOTE — Interval H&P Note (Signed)
History and Physical Interval Note:  12/17/2022 7:12 AM  Andrew Arnold  has presented today for surgery, with the diagnosis of PROSTATE CANCER.  The various methods of treatment have been discussed with the patient and family. After consideration of risks, benefits and other options for treatment, the patient has consented to  Procedure(s) with comments: XI ROBOTIC ASSISTED LAPAROSCOPIC RADICAL PROSTATECTOMY LEVEL 2 (N/A) - 210 MINUTES NEEDED FOR CASE BILATERAL PELVIC LYMPHADENECTOMY (Bilateral) as a surgical intervention.  The patient's history has been reviewed, patient examined, no change in status, stable for surgery.  I have reviewed the patient's chart and labs.  Questions were answered to the patient's satisfaction.     [employer]

## 2022-12-17 NOTE — Anesthesia Procedure Notes (Signed)
Procedure Name: Intubation Date/Time: 12/17/2022 8:29 AM  Performed by: Cleda Clarks, CRNAPre-anesthesia Checklist: Patient identified, Emergency Drugs available, Suction available and Patient being monitored Patient Re-evaluated:Patient Re-evaluated prior to induction Oxygen Delivery Method: Circle system utilized Preoxygenation: Pre-oxygenation with 100% oxygen Induction Type: IV induction Ventilation: Mask ventilation without difficulty Laryngoscope Size: Miller and 2 Grade View: Grade II Tube type: Oral Tube size: 7.5 mm Number of attempts: 1 Airway Equipment and Method: Stylet Placement Confirmation: ETT inserted through vocal cords under direct vision, positive ETCO2 and breath sounds checked- equal and bilateral Secured at: 22 cm Tube secured with: Tape Dental Injury: Teeth and Oropharynx as per pre-operative assessment

## 2022-12-17 NOTE — Op Note (Signed)
Preoperative diagnosis: Clinically localized adenocarcinoma of the prostate (clinical stage T1c N0 M0)  Postoperative diagnosis: Clinically localized adenocarcinoma of the prostate (clinical stage T1c N0 M0)  Procedure:  Robotic assisted laparoscopic radical prostatectomy (bilateral nerve sparing) Bilateral robotic assisted laparoscopic pelvic lymphadenectomy  Surgeon: Moody Bruins. M.D.  Assistant: Harrie Foreman, PA-C  An assistant was required for this surgical procedure.  The duties of the assistant included but were not limited to suctioning, passing suture, camera manipulation, retraction. This procedure would not be able to be performed without an Geophysicist/field seismologist.  Resident: Dr. Michaele Offer  Anesthesia: General  Complications: None  EBL: 150 mL  IVF:  1400 mL crystalloid  Specimens: Prostate and seminal vesicles Right pelvic lymph nodes Left pelvic lymph nodes  Disposition of specimens: Pathology  Drains: 20 Fr coude catheter # 19 Blake pelvic drain  Indication: Andrew Arnold is a 67 y.o. year old patient with clinically localized prostate cancer.  After a thorough review of the management options for treatment of prostate cancer, he elected to proceed with surgical therapy and the above procedure(s).  We have discussed the potential benefits and risks of the procedure, side effects of the proposed treatment, the likelihood of the patient achieving the goals of the procedure, and any potential problems that might occur during the procedure or recuperation. Informed consent has been obtained.  Description of procedure:  The patient was taken to the operating room and a general anesthetic was administered. He was given preoperative antibiotics, placed in the dorsal lithotomy position, and prepped and draped in the usual sterile fashion. Next a preoperative timeout was performed. A urethral catheter was placed into the bladder and a site was selected near the  umbilicus for placement of the camera port. This was placed using a standard open Hassan technique which allowed entry into the peritoneal cavity under direct vision and without difficulty. An 8 mm robotic port was placed and a pneumoperitoneum established. The camera was then used to inspect the abdomen and there was no evidence of any intra-abdominal injuries or other abnormalities. The remaining abdominal ports were then placed. 8 mm robotic ports were placed in the right lower quadrant, left lower quadrant, and far left lateral abdominal wall. A 5 mm port was placed in the right upper quadrant and a 12 mm port was placed in the right lateral abdominal wall for laparoscopic assistance. All ports were placed under direct vision without difficulty. The surgical cart was then docked.   Utilizing the cautery scissors, the bladder was reflected posteriorly allowing entry into the space of Retzius and identification of the endopelvic fascia and prostate. The periprostatic fat was then removed from the prostate allowing full exposure of the endopelvic fascia. The endopelvic fascia was then incised from the apex back to the base of the prostate bilaterally and the underlying levator muscle fibers were swept laterally off the prostate thereby isolating the dorsal venous complex. The dorsal vein was then stapled and divided with a 45 mm Flex Echelon stapler. Attention then turned to the bladder neck which was divided anteriorly thereby allowing entry into the bladder and exposure of the urethral catheter. The catheter balloon was deflated and the catheter was brought into the operative field and used to retract the prostate anteriorly. The posterior bladder neck was then examined and was divided allowing further dissection between the bladder and prostate posteriorly until the vasa deferentia and seminal vessels were identified. The vasa deferentia were isolated, divided, and lifted anteriorly. The seminal  vesicles were  dissected down to their tips with care to control the seminal vascular arterial blood supply. These structures were then lifted anteriorly and the space between Denonvillier's fascia and the anterior rectum was developed with a combination of sharp and blunt dissection. This isolated the vascular pedicles of the prostate.  The lateral prostatic fascia was then sharply incised allowing release of the neurovascular bundles bilaterally. The vascular pedicles of the prostate were then ligated with Weck clips between the prostate and neurovascular bundles and divided with sharp cold scissor dissection resulting in neurovascular bundle preservation. The neurovascular bundles were then separated off the apex of the prostate and urethra bilaterally.  The urethra was then sharply transected allowing the prostate specimen to be disarticulated. The pelvis was copiously irrigated and hemostasis was ensured. There was no evidence for rectal injury.  Attention then turned to the right pelvic sidewall. The fibrofatty tissue between the external iliac vein, confluence of the iliac vessels, hypogastric artery, and Cooper's ligament was dissected free from the pelvic sidewall with care to preserve the obturator nerve. Weck clips were used for lymphostasis and hemostasis. An identical procedure was performed on the contralateral side and the lymphatic packets were removed for permanent pathologic analysis.  Attention then turned to the urethral anastomosis. A 2-0 Vicryl slip knot was placed between Denonvillier's fascia, the posterior bladder neck, and the posterior urethra to reapproximate these structures. A double-armed 3-0 Monocryl suture was then used to perform a 360 running tension-free anastomosis between the bladder neck and urethra. A new urethral catheter was then placed into the bladder and irrigated. There were no blood clots within the bladder and the anastomosis appeared to be watertight. A #19 Blake drain was  then brought through the left lateral 8 mm port site and positioned appropriately within the pelvis. It was secured to the skin with a nylon suture. The surgical cart was then undocked. The right lateral 12 mm port site was closed at the fascial level with a 0 Vicryl suture placed laparoscopically. All remaining ports were then removed under direct vision. The prostate specimen was removed intact within the Endopouch retrieval bag via the periumbilical camera port site. This fascial opening was closed with two running 0 PDS sutures. 0.25% Marcaine was then injected into all port sites and all incisions were reapproximated at the skin level with 4-0 Monocryl subcuticular sutures and Dermabond. The patient appeared to tolerate the procedure well and without complications. The patient was able to be extubated and transferred to the recovery unit in satisfactory condition.   Moody Bruins MD

## 2022-12-18 ENCOUNTER — Encounter (HOSPITAL_COMMUNITY): Payer: Self-pay | Admitting: Urology

## 2022-12-18 DIAGNOSIS — Z7984 Long term (current) use of oral hypoglycemic drugs: Secondary | ICD-10-CM | POA: Diagnosis not present

## 2022-12-18 DIAGNOSIS — I10 Essential (primary) hypertension: Secondary | ICD-10-CM | POA: Diagnosis not present

## 2022-12-18 DIAGNOSIS — Z79899 Other long term (current) drug therapy: Secondary | ICD-10-CM | POA: Diagnosis not present

## 2022-12-18 DIAGNOSIS — Z7982 Long term (current) use of aspirin: Secondary | ICD-10-CM | POA: Diagnosis not present

## 2022-12-18 DIAGNOSIS — E119 Type 2 diabetes mellitus without complications: Secondary | ICD-10-CM | POA: Diagnosis not present

## 2022-12-18 DIAGNOSIS — D360 Benign neoplasm of lymph nodes: Secondary | ICD-10-CM | POA: Diagnosis not present

## 2022-12-18 DIAGNOSIS — C61 Malignant neoplasm of prostate: Secondary | ICD-10-CM | POA: Diagnosis not present

## 2022-12-18 LAB — HEMOGLOBIN AND HEMATOCRIT, BLOOD
HCT: 32.5 % — ABNORMAL LOW (ref 39.0–52.0)
Hemoglobin: 10.6 g/dL — ABNORMAL LOW (ref 13.0–17.0)

## 2022-12-18 LAB — GLUCOSE, CAPILLARY
Glucose-Capillary: 97 mg/dL (ref 70–99)
Glucose-Capillary: 86 mg/dL (ref 70–99)
Glucose-Capillary: 103 mg/dL — ABNORMAL HIGH (ref 70–99)

## 2022-12-18 MED ORDER — BISACODYL 10 MG RE SUPP
10.0000 mg | Freq: Once | RECTAL | Status: AC
  Administered 2022-12-18: 10 mg via RECTAL
  Filled 2022-12-18: qty 1

## 2022-12-18 MED ORDER — TRAMADOL HCL 50 MG PO TABS: 50.0000 mg | ORAL_TABLET | Freq: Four times a day (QID) | ORAL | Status: DC | PRN

## 2022-12-18 NOTE — Discharge Summary (Signed)
Date of admission: 12/17/2022  Date of discharge: 12/18/2022  Admission diagnosis: Prostate Cancer  Discharge diagnosis: Prostate Cancer  History and Physical: For full details, please see admission history and physical. Briefly, Andrew Arnold is a 67 y.o. gentleman with localized prostate cancer.  After discussing management/treatment options, he elected to proceed with surgical treatment.  Hospital Course: Andrew Arnold was taken to the operating room on 12/17/2022 and underwent a robotic assisted laparoscopic radical prostatectomy. He tolerated this procedure well and without complications. Postoperatively, he was able to be transferred to a regular hospital room following recovery from anesthesia.  He was able to begin ambulating the night of surgery. He remained hemodynamically stable overnight.  He had excellent urine output with appropriately minimal output from his pelvic drain and his pelvic drain was removed on POD #1.  He was transitioned to oral pain medication, tolerated a clear liquid diet, and had met all discharge criteria and was able to be discharged home later on POD#1.  Laboratory values:  Recent Labs    12/17/22 1559 12/18/22 0506  HGB 11.9* 10.6*  HCT 35.5* 32.5*    Disposition: Home  Discharge instruction: He was instructed to be ambulatory but to refrain from heavy lifting, strenuous activity, or driving. He was instructed on urethral catheter care.  Discharge medications:   Allergies as of 12/18/2022       Reactions   Penicillins    "I almost forgot I was allergic to it" cannot define what allergy is        Medication List     STOP taking these medications    ascorbic acid 500 MG tablet Commonly known as: VITAMIN C   aspirin 81 MG chewable tablet   B COMPLEX 100 PO   celecoxib 100 MG capsule Commonly known as: CELEBREX   cyanocobalamin 1000 MCG tablet Commonly known as: VITAMIN B12   tamsulosin 0.4 MG Caps capsule Commonly known as:  FLOMAX       TAKE these medications    amLODipine 10 MG tablet Commonly known as: NORVASC Take 1 tablet (10 mg total) by mouth daily.   atorvastatin 80 MG tablet Commonly known as: LIPITOR Take 80 mg by mouth at bedtime.   carvedilol 6.25 MG tablet Commonly known as: COREG Take 1 tablet (6.25 mg total) by mouth 2 (two) times daily with a meal.   cetirizine 10 MG tablet Commonly known as: ZYRTEC Take 10 mg by mouth in the morning.   dapagliflozin propanediol 10 MG Tabs tablet Commonly known as: Farxiga Take 1 tablet (10 mg total) by mouth daily. Please call for an office visit [phone]   docusate sodium 100 MG capsule Commonly known as: COLACE Take 1 capsule (100 mg total) by mouth 2 (two) times daily.   Entresto 24-26 MG Generic drug: sacubitril-valsartan TAKE 1 TABLET BY MOUTH TWICE DAILY   ferrous sulfate 325 (65 FE) MG tablet Take 325 mg by mouth daily with breakfast.   metFORMIN 1000 MG tablet Commonly known as: GLUCOPHAGE Take 1,000 mg by mouth 2 (two) times daily.   pantoprazole 40 MG tablet Commonly known as: PROTONIX Take 40 mg by mouth daily.   potassium chloride SA 20 MEQ tablet Commonly known as: KLOR-CON M TAKE 1 TABLET BY MOUTH EVERY DAY   spironolactone 25 MG tablet Commonly known as: ALDACTONE TAKE 1/2 TABLET(12.5 MG) BY MOUTH DAILY   sulfamethoxazole-trimethoprim 800-160 MG tablet Commonly known as: BACTRIM DS Take 1 tablet by mouth 2 (two) times daily. Start the  day prior to foley removal appointment   traMADol 50 MG tablet Commonly known as: Ultram Take 1-2 tablets (50-100 mg total) by mouth every 6 (six) hours as needed for moderate pain or severe pain.        Followup: He will followup in 1 week for catheter removal and to discuss his surgical pathology results.

## 2022-12-18 NOTE — Progress Notes (Signed)
1 Day Post-Op Subjective: The patient is doing well.  No nausea or vomiting. Pain is adequately controlled.  Objective: Vital signs in last 24 hours: Temp:  [95.4 F (35.2 C)-98.1 F (36.7 C)] 98.1 F (36.7 C) (06/07 0438) Pulse Rate:  [51-79] 73 (06/07 0438) Resp:  [9-22] 18 (06/07 0438) BP: (107-143)/(55-86) 123/55 (06/07 0438) SpO2:  [96 %-100 %] 98 % (06/07 0438)  Intake/Output from previous day: 06/06 0701 - 06/07 0700 In: 4245.8 [P.O.:240; I.V.:3805.8; IV Piggyback:200] Out: 2610 [Urine:2200; Drains:170; Blood:240] Intake/Output this shift: No intake/output data recorded.  Physical Exam:  General: Alert and oriented. CV: RRR Lungs: Clear bilaterally. GI: Soft, Nondistended. Incisions: Clean, dry, and intact Urine: Clear pink Extremities: Nontender, no erythema, no edema.  Lab Results: Recent Labs    12/17/22 1559 12/18/22 0506  HGB 11.9* 10.6*  HCT 35.5* 32.5*      Assessment/Plan: POD# 1 s/p robotic prostatectomy.  1) SL IVF 2) Ambulate, Incentive spirometry 3) Transition to oral pain medication 4) Dulcolax suppository 5) D/C pelvic drain 6) Plan for likely discharge later today   LOS: 0 days   Andrew Arnold 12/18/2022, 7:01 AM

## 2022-12-19 ENCOUNTER — Other Ambulatory Visit: Payer: Self-pay

## 2022-12-22 LAB — SURGICAL PATHOLOGY

## 2022-12-24 ENCOUNTER — Other Ambulatory Visit (HOSPITAL_COMMUNITY): Payer: Self-pay | Admitting: Internal Medicine

## 2023-01-06 ENCOUNTER — Other Ambulatory Visit: Payer: Self-pay | Admitting: *Deleted

## 2023-01-06 DIAGNOSIS — B351 Tinea unguium: Secondary | ICD-10-CM | POA: Diagnosis not present

## 2023-01-06 MED ORDER — SPIRONOLACTONE 25 MG PO TABS: 12.5000 mg | ORAL_TABLET | Freq: Every day | ORAL | 6 refills | Status: DC

## 2023-01-11 DIAGNOSIS — E782 Mixed hyperlipidemia: Secondary | ICD-10-CM | POA: Diagnosis not present

## 2023-01-11 DIAGNOSIS — E1165 Type 2 diabetes mellitus with hyperglycemia: Secondary | ICD-10-CM | POA: Diagnosis not present

## 2023-01-11 DIAGNOSIS — R7301 Impaired fasting glucose: Secondary | ICD-10-CM | POA: Diagnosis not present

## 2023-01-11 DIAGNOSIS — E559 Vitamin D deficiency, unspecified: Secondary | ICD-10-CM | POA: Diagnosis not present

## 2023-01-21 DIAGNOSIS — M6281 Muscle weakness (generalized): Secondary | ICD-10-CM | POA: Diagnosis not present

## 2023-01-21 DIAGNOSIS — M62838 Other muscle spasm: Secondary | ICD-10-CM | POA: Diagnosis not present

## 2023-01-21 DIAGNOSIS — N393 Stress incontinence (female) (male): Secondary | ICD-10-CM | POA: Diagnosis not present

## 2023-01-24 ENCOUNTER — Other Ambulatory Visit (HOSPITAL_COMMUNITY): Payer: Self-pay | Admitting: Internal Medicine

## 2023-01-25 ENCOUNTER — Other Ambulatory Visit: Payer: Self-pay

## 2023-01-25 MED ORDER — DAPAGLIFLOZIN PROPANEDIOL 10 MG PO TABS: 10.0000 mg | ORAL_TABLET | Freq: Every day | ORAL | 0 refills | Status: DC

## 2023-01-26 DIAGNOSIS — I10 Essential (primary) hypertension: Secondary | ICD-10-CM | POA: Diagnosis not present

## 2023-01-26 DIAGNOSIS — E1165 Type 2 diabetes mellitus with hyperglycemia: Secondary | ICD-10-CM | POA: Diagnosis not present

## 2023-01-26 DIAGNOSIS — E782 Mixed hyperlipidemia: Secondary | ICD-10-CM | POA: Diagnosis not present

## 2023-01-26 DIAGNOSIS — C61 Malignant neoplasm of prostate: Secondary | ICD-10-CM | POA: Diagnosis not present

## 2023-01-27 DIAGNOSIS — E1165 Type 2 diabetes mellitus with hyperglycemia: Secondary | ICD-10-CM | POA: Diagnosis not present

## 2023-01-27 DIAGNOSIS — Z79899 Other long term (current) drug therapy: Secondary | ICD-10-CM | POA: Diagnosis not present

## 2023-01-27 DIAGNOSIS — R799 Abnormal finding of blood chemistry, unspecified: Secondary | ICD-10-CM | POA: Diagnosis not present

## 2023-01-27 DIAGNOSIS — C61 Malignant neoplasm of prostate: Secondary | ICD-10-CM | POA: Diagnosis not present

## 2023-02-02 DIAGNOSIS — Z1211 Encounter for screening for malignant neoplasm of colon: Secondary | ICD-10-CM | POA: Diagnosis not present

## 2023-02-08 DIAGNOSIS — M6281 Muscle weakness (generalized): Secondary | ICD-10-CM | POA: Diagnosis not present

## 2023-02-08 DIAGNOSIS — N393 Stress incontinence (female) (male): Secondary | ICD-10-CM | POA: Diagnosis not present

## 2023-02-08 DIAGNOSIS — M62838 Other muscle spasm: Secondary | ICD-10-CM | POA: Diagnosis not present

## 2023-02-22 ENCOUNTER — Other Ambulatory Visit: Payer: Self-pay

## 2023-02-22 DIAGNOSIS — D649 Anemia, unspecified: Secondary | ICD-10-CM

## 2023-02-23 ENCOUNTER — Encounter: Payer: Self-pay | Admitting: Adult Health

## 2023-02-23 ENCOUNTER — Ambulatory Visit: Payer: BC Managed Care – PPO | Admitting: Adult Health

## 2023-02-23 VITALS — BP 120/65 | HR 66 | Ht 68.0 in | Wt 230.0 lb

## 2023-02-23 DIAGNOSIS — M5416 Radiculopathy, lumbar region: Secondary | ICD-10-CM | POA: Diagnosis not present

## 2023-02-23 NOTE — Patient Instructions (Addendum)
Refer to [facility] for further recommends on low back pain    Follow up to be determined after evaluation with [facility]

## 2023-02-23 NOTE — Progress Notes (Signed)
ASSESSMENT AND PLAN  Andrew Arnold is a 67 y.o. 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, mainly on the right hemicord Gait abnormality, Worsening low back pain for more than 6 months, radiating pain to left lower extremity  Consistent with left lumbar radiculopathy  Has tried and failed conservative treatment,  MRI lumbar 11/2022 moderate spinal stenosis and severe foraminal stenosis at multiple levels  X-ray of lumbar in December 2023 showed no acute abnormality moderate to severe osseous neuroforaminal stenosis at L4-5, L5-S1,  Neurosurgery no surgical intervention recommended  Referral placed to [facility] for further recommendations     Follow-up to be determined if needed after evaluation with [facility]    DIAGNOSTIC DATA (LABS, IMAGING, TESTING) - I reviewed patient records, labs, notes, testing and imaging myself where available.  MRI lumbar spine 11/28/2022 IMPRESSION: 1. Masslike signal abnormality partially visible within the lumen of the urinary bladder. Similar appearance on February CT Abdomen and Pelvis attributed to enlarged prostate. 2. No metastatic disease identified in the lumbar spine on this noncontrast exam. 3. Severe chronic lower lumbar disc and endplate degeneration, including L4 through S1 endplate Schmorl's nodes. Patchy associated degenerative marrow edema. 4. Up to moderate multifactorial degenerative spinal stenosis at L3-L4 and L4-L5. Moderate or severe neural foraminal stenosis at the bilateral L3, left L4, and left L5 nerve levels.    MEDICAL HISTORY:  Update 02/23/2023 JM: Patient returns for follow-up visit unaccompanied.  Has been stable since prior visit.  Reports back pain has improved slightly since prior visit but can fluctuate. Worse after sitting for prolonged period of time, has hard time standing after sitting on soft chair  for prolonged period of time.  Ambulates with a cane outdoors to help with balance, no recent falls. S/p prostatectomy 12/2022, Celebrex discontinued after procedure per patient, did not feel medication was that beneficial.  He does report being seen by neurosurgery on 6/24 after completion of MRI lumbar spine (see above), no surgical interventions recommended nor any other treatment options.     History provided for reference purposes only UPDATE Aug 25 2022 Dr. Terrace Arabia: He continued complaints of significant low back pain, gait abnormalities, low back pain mainly stay on the left side radiating to left anterior lateral thigh and, anterior leg to top of left foot involving first toe, in the left L5 distribution  He has to ambulate with a cane, continue have bilateral upper extremity paresthesia, wears sleevs  He has tried Tylenol and NSAIDs with limited help  X-ray of lumbar in July 11, 2022 showed no acute abnormality moderate to severe osseous neuroforaminal stenosis at L4-5, L5-S1 level   Update May 22, 2022 Dr. Terrace Arabia: Patient return for electrodiagnostic study today, which showed evidence of moderate axonal sensorimotor polyneuropathy, also evidence of 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  Continue complains of left lower extremity paresthesia, gave out underneath him, intermittent low back pain  MRI of lumbar is pending, significant right shoulder pain,   Consult visit 03/10/2022 Dr. Terrace Arabia: Andrew Arnold, is a 67 year old male seen in request by PICC surgeon Francena Hanly for evaluation of right-sided weakness, his primary care physician is Dr. Gareth Morgan, initial evaluation was on March 10, 2022  I reviewed and summarized the referring note. PMHX. HTN HLD DM-since 2022,  GERD. 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 Lucia Bitter on June 03, 2021  He reported recovering well, he can move his right arm and walking better, he lives alone, Hospital admission in January 2023 for generalized weakness, pneumonia versus gastroenteritis, weight loss, severely dehydrated, orthostatic hypotension,  Ultrasound of carotid artery showed no significant large vessel disease  ER presentation April 23 chest pain, CT chest showed patchy groundglass opacity in the right lower lobe right middle lobe likely infection  He complains of right shoulder pain, was seen by orthopedic surgeon Dr. Rennis Chris, MRI of right shoulder from a note also January 30, 2022, severe supraspinatus and infraspinatus tendinosis, 2.0 x 1.9 cm high-grade partial articular surface tear is evident, just medial to the infraspinatus insertion,   During my examination, he was noted to have right arm and leg weakness, before proceeding to further orthopedic intervention, is referred to neurology for evaluation  Patient is a poor historian, complains of gait abnormality, right hand weakness, paresthesia, denies significant neck pain, does have low back pain, more to the left side, also noticed left lateral leg paresthesia, left toe numbness    PHYSICAL EXAM: There were no vitals filed for this visit.     PHYSICAL EXAMNIATION:  Gen: NAD, conversant, well nourised, well groomed                     Cardiovascular: Regular rate rhythm, no peripheral  edema, warm, nontender. Eyes: Conjunctivae clear without exudates or hemorrhage Neck: Supple, no carotid bruits. Pulmonary: Clear to auscultation bilaterally   NEUROLOGICAL EXAM:  MENTAL STATUS: Speech/cognition: Awake, alert, oriented to history taking and casual conversation CRANIAL NERVES: CN II: Visual fields are full to confrontation. Pupils are round equal and briskly reactive to light. CN III, IV, VI: extraocular movement are normal. No ptosis. CN V: Facial sensation is intact to light touch CN VII: Face is symmetric with normal eye closure  CN VIII: Hearing is normal to causal conversation. CN IX, X: Phonation is normal. CN XI: Head turning and shoulder shrug are intact CN XII: Narrow oropharyngeal space  MOTOR: mild  right shoulder abduction, external rotation, elbow flexion, extension weakness, which is also limited by his right shoulder pain, no significant right upper extremity distal muscle weakness  No significant lower extremity weakness  REFLEXES: Reflexes are 2+ and symmetric at the biceps, triceps, 3R/2+Lknees, and absent ankles. Plantar responses are flexor.  SENSORY: Intact to light touch   COORDINATION: There is no trunk or limb dysmetria noted.  GAIT/STANCE: He needs push-up to get up from seated position,  antalgic, use of cane     REVIEW OF SYSTEMS:  Full 14 system review of systems performed and notable only for as above All other review of systems were negative.   ALLERGIES: Allergies  Allergen Reactions   Penicillins     "I almost forgot I was allergic to it" cannot define what allergy is    HOME MEDICATIONS: Current Outpatient Medications  Medication Sig Dispense Refill   amLODipine (NORVASC)  10 MG tablet Take 1 tablet (10 mg total) by mouth daily. 30 tablet 0   atorvastatin (LIPITOR) 80 MG tablet Take 80 mg by mouth at bedtime.     carvedilol (COREG) 6.25 MG tablet Take 1 tablet (6.25 mg total) by mouth 2 (two) times daily with a  meal. 60 tablet 0   cetirizine (ZYRTEC) 10 MG tablet Take 10 mg by mouth in the morning.     dapagliflozin propanediol (FARXIGA) 10 MG TABS tablet Take 1 tablet (10 mg total) by mouth daily. Please call our office to schedule appointment for further refills 30 tablet 0   docusate sodium (COLACE) 100 MG capsule Take 1 capsule (100 mg total) by mouth 2 (two) times daily.     ferrous sulfate 325 (65 FE) MG tablet Take 325 mg by mouth daily with breakfast.     metFORMIN (GLUCOPHAGE) 1000 MG tablet Take 1,000 mg by mouth 2 (two) times daily.     pantoprazole (PROTONIX) 40 MG tablet Take 40 mg by mouth daily.     potassium chloride SA (KLOR-CON M) 20 MEQ tablet TAKE 1 TABLET BY MOUTH EVERY DAY 90 tablet 3   sacubitril-valsartan (ENTRESTO) 24-26 MG TAKE 1 TABLET BY MOUTH TWICE DAILY 60 tablet 11   spironolactone (ALDACTONE) 25 MG tablet Take 0.5 tablets (12.5 mg total) by mouth daily. NEEDS FOLLOW UP APPOINTMENT FORE MORE REFILLS 15 tablet 6   sulfamethoxazole-trimethoprim (BACTRIM DS) 800-160 MG tablet Take 1 tablet by mouth 2 (two) times daily. Start the day prior to foley removal appointment 6 tablet 0   traMADol (ULTRAM) 50 MG tablet Take 1-2 tablets (50-100 mg total) by mouth every 6 (six) hours as needed for moderate pain or severe pain. 20 tablet 0   No current facility-administered medications for this visit.    PAST MEDICAL HISTORY: Past Medical History:  Diagnosis Date   Allergic rhinitis due to pollen    on allergy shots   Anemia    Arrhythmia    1980s hospitalized for HTN and ?irregular rhythm but no specific dx but resolved during hospitalization   Coronary artery disease    Diabetes mellitus without complication (HCC)    HTN (hypertension)    Hyperlipidemia    Nausea and vomiting 08/01/2021   OSA (obstructive sleep apnea)    mild obstructive sleep apnea with an AHI of 10.8/h with oxygen saturations as low as 85%.   prostate ca 07/2022    PAST SURGICAL HISTORY: Past Surgical  History:  Procedure Laterality Date   ANTERIOR CERVICAL DECOMP/DISCECTOMY FUSION N/A 06/03/2021   Procedure: Anterior Cervical Decompression Fusion  Cervical three-four;  Surgeon: Bedelia Person, MD;  Location: [facility] OR;  Service: Neurosurgery;  Laterality: N/A;   colonoscopy  07/14/2003   Dr. Jena Gauss: normal rectum, sigmoid diverticula in colonic mucosa   COLONOSCOPY N/A 02/15/2014   Procedure: COLONOSCOPY;  Surgeon: Corbin Ade, MD;  Location: [facility] SUITE;  Service: Endoscopy;  Laterality: N/A;  2:00   COLONOSCOPY WITH PROPOFOL N/A 05/08/2022   Procedure: COLONOSCOPY WITH PROPOFOL;  Surgeon: Corbin Ade, MD;  Location: [facility];  Service: Endoscopy;  Laterality: N/A;  11:00 am   LYMPHADENECTOMY Bilateral 12/17/2022   Procedure: BILATERAL PELVIC LYMPHADENECTOMY;  Surgeon: Heloise Purpura, MD;  Location: [facility] ORS;  Service: Urology;  Laterality: Bilateral;   PROSTATE BIOPSY     RIGHT/LEFT HEART CATH AND CORONARY ANGIOGRAPHY N/A 05/23/2021   Procedure: RIGHT/LEFT HEART CATH AND CORONARY ANGIOGRAPHY;  Surgeon: Dolores Patty, MD;  Location: [facility] INVASIVE CV LAB;  Service: Cardiovascular;  Laterality: N/A;   ROBOT ASSISTED LAPAROSCOPIC RADICAL PROSTATECTOMY N/A 12/17/2022   Procedure: XI ROBOTIC ASSISTED LAPAROSCOPIC RADICAL PROSTATECTOMY LEVEL 2;  Surgeon: Heloise Purpura, MD;  Location: [facility] ORS;  Service: Urology;  Laterality: N/A;  210 MINUTES NEEDED FOR CASE    FAMILY HISTORY: Family History  Problem Relation Age of Onset   Breast cancer Mother 34   Cirrhosis Father        cirrhosis of the liver; alcohol related   Diabetes Sister    Other Other        neice was on allergy vaccine when young   Colon cancer Neg Hx     SOCIAL HISTORY: Social History   Socioeconomic History   Marital status: Single    Spouse name: Not on file   Number of children: Not on file   Years of education: Not on file   Highest education level: Not on file  Occupational History   Occupation:  school counselor  Tobacco Use   Smoking status: Never   Smokeless tobacco: Never  Vaping Use   Vaping status: Never Used  Substance and Sexual Activity   Alcohol use: No   Drug use: No   Sexual activity: Yes  Other Topics Concern   Not on file  Social History Narrative   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[place], [place] Lives in [place]   Social Determinants of Health   Financial Resource Strain: Not on file  Food Insecurity: No Food Insecurity (12/17/2022)   Hunger Vital Sign    Worried About Running Out of Food in the Last Year: Never true    Ran Out of Food in the Last Year: Never true  Transportation Needs: No Transportation Needs (12/17/2022)   PRAPARE - Administrator, Civil Service (Medical): No    Lack of Transportation (Non-Medical): No  Physical Activity: Not on file  Stress: Not on file  Social Connections: Not on file  Intimate Partner Violence: Not At Risk (12/17/2022)   Humiliation, Afraid, Rape, and Kick questionnaire    Fear of Current or Ex-Partner: No    Emotionally Abused: No    Physically Abused: No    Sexually Abused: No     I spent 25 minutes of face-to-face and non-face-to-face time with patient.  This included previsit chart review, lab review, study review, order entry, electronic health record documentation, patient education and discussion regarding above diagnoses and treatment plan and answered all other questions to patient's satisfaction  Ihor Austin, [facility]  [facility] [address] [place], [place] [address]  Phone [phone] Fax [phone] Note: This document was prepared with digital dictation and possible smart phrase technology. Any transcriptional errors that result from this process are unintentional.

## 2023-02-24 ENCOUNTER — Telehealth: Payer: Self-pay | Admitting: Adult Health

## 2023-02-24 ENCOUNTER — Other Ambulatory Visit (HOSPITAL_COMMUNITY): Payer: Self-pay | Admitting: Internal Medicine

## 2023-02-24 NOTE — Telephone Encounter (Signed)
Referral for pain clinic fax to [facility]. Phone: [phone], Fax: [phone]

## 2023-02-26 ENCOUNTER — Other Ambulatory Visit (HOSPITAL_COMMUNITY): Payer: Self-pay

## 2023-03-04 ENCOUNTER — Encounter: Payer: Self-pay | Admitting: Internal Medicine

## 2023-03-04 ENCOUNTER — Ambulatory Visit (INDEPENDENT_AMBULATORY_CARE_PROVIDER_SITE_OTHER): Payer: BC Managed Care – PPO | Admitting: Internal Medicine

## 2023-03-04 VITALS — BP 128/68 | HR 66 | Ht 69.0 in | Wt 228.8 lb

## 2023-03-04 DIAGNOSIS — C61 Malignant neoplasm of prostate: Secondary | ICD-10-CM

## 2023-03-04 DIAGNOSIS — Z7984 Long term (current) use of oral hypoglycemic drugs: Secondary | ICD-10-CM

## 2023-03-04 DIAGNOSIS — D649 Anemia, unspecified: Secondary | ICD-10-CM

## 2023-03-04 DIAGNOSIS — I10 Essential (primary) hypertension: Secondary | ICD-10-CM | POA: Diagnosis not present

## 2023-03-04 DIAGNOSIS — E119 Type 2 diabetes mellitus without complications: Secondary | ICD-10-CM | POA: Diagnosis not present

## 2023-03-04 DIAGNOSIS — E785 Hyperlipidemia, unspecified: Secondary | ICD-10-CM

## 2023-03-04 DIAGNOSIS — I5042 Chronic combined systolic (congestive) and diastolic (congestive) heart failure: Secondary | ICD-10-CM | POA: Diagnosis not present

## 2023-03-04 DIAGNOSIS — M48061 Spinal stenosis, lumbar region without neurogenic claudication: Secondary | ICD-10-CM

## 2023-03-04 DIAGNOSIS — I509 Heart failure, unspecified: Secondary | ICD-10-CM | POA: Insufficient documentation

## 2023-03-04 DIAGNOSIS — E1169 Type 2 diabetes mellitus with other specified complication: Secondary | ICD-10-CM

## 2023-03-04 NOTE — Assessment & Plan Note (Signed)
Current antihypertensive regimen consists of amlodipine 10 mg daily, Entresto 24-26 mg twice daily, carvedilol 6.25 mg twice daily, and spironolactone 12.5 mg twice daily.  BP today is 128/68.  No medication changes are indicated.

## 2023-03-04 NOTE — Patient Instructions (Signed)
It was a pleasure to see you today.  Thank you for giving us the opportunity to be involved in your care.  Below is a brief recap of your visit and next steps.  We will plan to see you again in 3 months.  Summary You have established care today No medication changes have been made. We will plan for follow up in 3 months.  

## 2023-03-04 NOTE — Assessment & Plan Note (Signed)
No recent lipid panel available for review.  He is currently prescribed atorvastatin 80 mg daily.  No medication changes are indicated today.

## 2023-03-04 NOTE — Assessment & Plan Note (Signed)
Previously documented history of T2DM.  States that labs were recently updated but does not know his most recent A1c.  He is currently prescribed metformin 1000 mg twice daily and Farxiga 10 mg daily.  No medication changes are indicated at this time.

## 2023-03-04 NOTE — Assessment & Plan Note (Signed)
Followed by neurology.  [facility] has been referred to [facility].  MRI from May 2024 shows moderate multifactorial degenerative spinal stenosis of the lumbar spine and moderate-severe neural foraminal stenosis.  He endorses chronic left lower back pain with radicular symptoms in the left leg.

## 2023-03-04 NOTE — Assessment & Plan Note (Signed)
S/p radical prostatectomy in June 2024.

## 2023-03-04 NOTE — Assessment & Plan Note (Signed)
Euvolemic on exam today.  EF 65-70% on TTE from November 2022.  He is currently prescribed Entresto, carvedilol, spironolactone, and [place].  No medication changes are indicated at this time.

## 2023-03-04 NOTE — Assessment & Plan Note (Signed)
Noted on previous labs.  Repeat CBC is pending per GI.

## 2023-03-04 NOTE — Progress Notes (Signed)
New Patient Office Visit  Subjective    Patient ID: Andrew Arnold, male    DOB: 08-13-1955  Age: 67 y.o. MRN: [id number]  CC:  Chief Complaint  Patient presents with   Establish Care    HPI Andrew Arnold presents to establish care.  He is a 67 year old male who endorses a past medical history significant for HTN, T2DM, HLD, diastolic HF, prostate cancer s/p radical prostatectomy (June 2024), chronic lumbar back pain, and normocytic anemia.  Previously followed by Dr. Sudie Bailey.  [name] reports feeling fairly well today.  He endorses chronic lumbar back pain with radicular pain in his left leg.  He is otherwise asymptomatic and has no acute concerns to discuss aside from desiring to establish care.  He currently works as a Clinical biochemist.  He denies a history of tobacco, alcohol, and illicit drug use.  His family medical history is significant for diabetes mellitus and breast cancer.  Chronic medical conditions and outstanding preventative care items discussed today are individually addressed A/P below.   Outpatient Encounter Medications as of 03/04/2023  Medication Sig   acetaminophen (TYLENOL) 500 MG tablet Take 500 mg by mouth every 6 (six) hours as needed.   amLODipine (NORVASC) 10 MG tablet Take 1 tablet (10 mg total) by mouth daily.   atorvastatin (LIPITOR) 80 MG tablet Take 80 mg by mouth at bedtime.   carvedilol (COREG) 6.25 MG tablet Take 1 tablet (6.25 mg total) by mouth 2 (two) times daily with a meal.   cetirizine (ZYRTEC) 10 MG tablet Take 10 mg by mouth in the morning.   dapagliflozin propanediol (FARXIGA) 10 MG TABS tablet Take 1 tablet (10 mg total) by mouth daily. NEEDS FOLLOW UP APPOINTMENT FOR MORE REFILLS   docusate sodium (COLACE) 100 MG capsule Take 1 capsule (100 mg total) by mouth 2 (two) times daily.   ferrous sulfate 325 (65 FE) MG tablet Take 325 mg by mouth daily with breakfast.   metFORMIN (GLUCOPHAGE) 1000 MG tablet Take 1,000 mg by mouth 2  (two) times daily.   pantoprazole (PROTONIX) 40 MG tablet Take 40 mg by mouth daily.   sacubitril-valsartan (ENTRESTO) 24-26 MG TAKE 1 TABLET BY MOUTH TWICE DAILY   spironolactone (ALDACTONE) 25 MG tablet Take 0.5 tablets (12.5 mg total) by mouth daily. NEEDS FOLLOW UP APPOINTMENT FORE MORE REFILLS   potassium chloride SA (KLOR-CON M) 20 MEQ tablet TAKE 1 TABLET BY MOUTH EVERY DAY (Patient not taking: Reported on 03/04/2023)   No facility-administered encounter medications on file as of 03/04/2023.    Past Medical History:  Diagnosis Date   Allergic rhinitis due to pollen    on allergy shots   Anemia    Arrhythmia    1980s hospitalized for HTN and ?irregular rhythm but no specific dx but resolved during hospitalization   Coronary artery disease    Diabetes mellitus without complication (HCC)    HTN (hypertension)    Hyperlipidemia    Nausea and vomiting 08/01/2021   OSA (obstructive sleep apnea)    mild obstructive sleep apnea with an AHI of 10.8/h with oxygen saturations as low as 85%.   prostate ca 07/2022    Past Surgical History:  Procedure Laterality Date   ANTERIOR CERVICAL DECOMP/DISCECTOMY FUSION N/A 06/03/2021   Procedure: Anterior Cervical Decompression Fusion  Cervical three-four;  Surgeon: Bedelia Person, MD;  Location: [facility] OR;  Service: Neurosurgery;  Laterality: N/A;   colonoscopy  07/14/2003   Dr. Jena Gauss: normal rectum, sigmoid diverticula  in colonic mucosa   COLONOSCOPY N/A 02/15/2014   Procedure: COLONOSCOPY;  Surgeon: Corbin Ade, MD;  [facility];  Service: Endoscopy;  Laterality: N/A;  2:00   COLONOSCOPY WITH PROPOFOL N/A 05/08/2022   Procedure: COLONOSCOPY WITH PROPOFOL;  Surgeon: Corbin Ade, MD;  [facility];  Service: Endoscopy;  Laterality: N/A;  11:00 am   LYMPHADENECTOMY Bilateral 12/17/2022   Procedure: BILATERAL PELVIC LYMPHADENECTOMY;  Surgeon: Heloise Purpura, MD;  Location: [facility];  Service: Urology;  Laterality:  Bilateral;   PROSTATE BIOPSY     RIGHT/LEFT HEART CATH AND CORONARY ANGIOGRAPHY N/A 05/23/2021   Procedure: RIGHT/LEFT HEART CATH AND CORONARY ANGIOGRAPHY;  Surgeon: Dolores Patty, MD;  [facility];  Service: Cardiovascular;  Laterality: N/A;   ROBOT ASSISTED LAPAROSCOPIC RADICAL PROSTATECTOMY N/A 12/17/2022   Procedure: XI ROBOTIC ASSISTED LAPAROSCOPIC RADICAL PROSTATECTOMY LEVEL 2;  Surgeon: Heloise Purpura, MD;  Location: [facility];  Service: Urology;  Laterality: N/A;  210 MINUTES NEEDED FOR CASE    Family History  Problem Relation Age of Onset   Breast cancer Mother 9   Cirrhosis Father        cirrhosis of the liver; alcohol related   Diabetes Sister    Other Other        neice was on allergy vaccine when young   Colon cancer Neg Hx     Social History   Socioeconomic History   Marital status: Single    Spouse name: Not on file   Number of children: Not on file   Years of education: Not on file   Highest education level: Not on file  Occupational History   Occupation: school counselor  Tobacco Use   Smoking status: Never   Smokeless tobacco: Never  Vaping Use   Vaping status: Never Used  Substance and Sexual Activity   Alcohol use: No   Drug use: No   Sexual activity: Yes  Other Topics Concern   Not on file  Social History Narrative   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[place], [place] Lives in [place]   Social Determinants of Health   Financial Resource Strain: Not on file  Food Insecurity: No Food Insecurity (12/17/2022)   Hunger Vital Sign    Worried About Running Out of Food in the Last Year: Never true    Ran Out of Food in the Last Year: Never true  Transportation Needs: No Transportation Needs (12/17/2022)   PRAPARE - Administrator, Civil Service (Medical): No    Lack of Transportation (Non-Medical): No  Physical Activity: Not on file  Stress: Not on file  Social Connections: Not on file  Intimate Partner Violence: Not At Risk (12/17/2022)    Humiliation, Afraid, Rape, and Kick questionnaire    Fear of Current or Ex-Partner: No    Emotionally Abused: No    Physically Abused: No    Sexually Abused: No   Review of Systems  Constitutional:  Negative for chills and fever.  HENT:  Negative for sore throat.   Respiratory:  Negative for cough and shortness of breath.   Cardiovascular:  Negative for chest pain, palpitations and leg swelling.  Gastrointestinal:  Negative for abdominal pain, blood in stool, constipation, diarrhea, nausea and vomiting.  Genitourinary:  Negative for dysuria and hematuria.  Musculoskeletal:  Positive for back pain (Chronic lumbar back pain with left lower extremity radicular pain). Negative for myalgias.  Skin:  Negative for itching and rash.  Neurological:  Negative for  dizziness and headaches.  Psychiatric/Behavioral:  Negative for depression and suicidal ideas.    Objective    BP 128/68   Pulse 66   Ht 5\' 9"  (1.753 m)   Wt 228 lb 12.8 oz (103.8 kg)   SpO2 98%   BMI 33.79 kg/m   Physical Exam Vitals reviewed.  Constitutional:      General: He is not in acute distress.    Appearance: Normal appearance. He is obese. He is not ill-appearing.  HENT:     Head: Normocephalic and atraumatic.     Right Ear: External ear normal.     Left Ear: External ear normal.     Nose: Nose normal. No congestion or rhinorrhea.     Mouth/Throat:     Mouth: Mucous membranes are moist.     Pharynx: Oropharynx is clear.  Eyes:     General: No scleral icterus.    Extraocular Movements: Extraocular movements intact.     Conjunctiva/sclera: Conjunctivae normal.     Pupils: Pupils are equal, round, and reactive to light.  Cardiovascular:     Rate and Rhythm: Normal rate and regular rhythm.     Pulses: Normal pulses.     Heart sounds: Murmur heard.  Pulmonary:     Effort: Pulmonary effort is normal.     Breath sounds: Normal breath sounds. No wheezing, rhonchi or rales.  Abdominal:     General: Abdomen is  flat. Bowel sounds are normal. There is no distension.     Palpations: Abdomen is soft.     Tenderness: There is no abdominal tenderness.  Musculoskeletal:        General: No swelling or deformity. Normal range of motion.     Cervical back: Normal range of motion.  Skin:    General: Skin is warm and dry.     Capillary Refill: Capillary refill takes less than 2 seconds.  Neurological:     General: No focal deficit present.     Mental Status: He is alert and oriented to person, place, and time.     Motor: No weakness.  Psychiatric:        Mood and Affect: Mood normal.        Behavior: Behavior normal.        Thought Content: Thought content normal.    Assessment & Plan:   Problem List Items Addressed This Visit       Essential hypertension - Primary (Chronic)    Current antihypertensive regimen consists of amlodipine 10 mg daily, Entresto 24-26 mg twice daily, carvedilol 6.25 mg twice daily, and spironolactone 12.5 mg twice daily.  BP today is 128/68.  No medication changes are indicated.      CHF (congestive heart failure) (HCC)    Euvolemic on exam today.  EF 65-70% on TTE from November 2022.  He is currently prescribed Entresto, carvedilol, spironolactone, and [place].  No medication changes are indicated at this time.      DM2 (diabetes mellitus, type 2) (HCC) (Chronic)    Previously documented history of T2DM.  States that labs were recently updated but does not know his most recent A1c.  He is currently prescribed metformin 1000 mg twice daily and Farxiga 10 mg daily.  No medication changes are indicated at this time.      Hyperlipidemia associated with type 2 diabetes mellitus (HCC)    No recent lipid panel available for review.  He is currently prescribed atorvastatin 80 mg daily.  No medication changes are indicated  today.      Malignant neoplasm of prostate [facility])    S/p radical prostatectomy in June 2024.      Normocytic anemia    Noted on previous labs.  Repeat  CBC is pending per GI.      Lumbar stenosis    Followed by neurology.  [facility]  MRI from May 2024 shows moderate multifactorial degenerative spinal stenosis of the lumbar spine and moderate-severe neural foraminal stenosis.  He endorses chronic left lower back pain with radicular symptoms in the left leg.       Return in about 3 months (around 06/04/2023).   Billie Lade, MD

## 2023-03-16 DIAGNOSIS — R202 Paresthesia of skin: Secondary | ICD-10-CM | POA: Diagnosis not present

## 2023-03-23 DIAGNOSIS — D649 Anemia, unspecified: Secondary | ICD-10-CM | POA: Diagnosis not present

## 2023-03-25 DIAGNOSIS — M6281 Muscle weakness (generalized): Secondary | ICD-10-CM | POA: Diagnosis not present

## 2023-03-25 DIAGNOSIS — M62838 Other muscle spasm: Secondary | ICD-10-CM | POA: Diagnosis not present

## 2023-03-25 DIAGNOSIS — N393 Stress incontinence (female) (male): Secondary | ICD-10-CM | POA: Diagnosis not present

## 2023-03-25 DIAGNOSIS — C61 Malignant neoplasm of prostate: Secondary | ICD-10-CM | POA: Diagnosis not present

## 2023-03-28 ENCOUNTER — Other Ambulatory Visit (HOSPITAL_COMMUNITY): Payer: Self-pay | Admitting: Internal Medicine

## 2023-03-30 DIAGNOSIS — C61 Malignant neoplasm of prostate: Secondary | ICD-10-CM | POA: Diagnosis not present

## 2023-03-30 DIAGNOSIS — N5201 Erectile dysfunction due to arterial insufficiency: Secondary | ICD-10-CM | POA: Diagnosis not present

## 2023-03-30 DIAGNOSIS — N393 Stress incontinence (female) (male): Secondary | ICD-10-CM | POA: Diagnosis not present

## 2023-04-05 ENCOUNTER — Ambulatory Visit: Payer: BC Managed Care – PPO | Attending: Nurse Practitioner

## 2023-04-05 DIAGNOSIS — M5459 Other low back pain: Secondary | ICD-10-CM | POA: Insufficient documentation

## 2023-04-05 DIAGNOSIS — M6281 Muscle weakness (generalized): Secondary | ICD-10-CM | POA: Insufficient documentation

## 2023-04-05 DIAGNOSIS — R2689 Other abnormalities of gait and mobility: Secondary | ICD-10-CM | POA: Diagnosis not present

## 2023-04-05 NOTE — Therapy (Signed)
OUTPATIENT PHYSICAL THERAPY THORACOLUMBAR EVALUATION   Patient Name: Andrew Arnold MRN: [id number] DOB:[birth year], 67 y.o., male Today's Date: 04/05/2023  END OF SESSION:  PT End of Session - 04/05/23 1700     Visit Number 1    Number of Visits 9    Date for PT Re-Evaluation 05/31/23    Authorization Type BCBS    PT Start Time 1610    PT Stop Time 1655    PT Time Calculation (min) 45 min    Activity Tolerance Patient tolerated treatment well    Behavior During Therapy WFL for tasks assessed/performed             Past Medical History:  Diagnosis Date   Allergic rhinitis due to pollen    on allergy shots   Anemia    Arrhythmia    1980s hospitalized for HTN and ?irregular rhythm but no specific dx but resolved during hospitalization   Coronary artery disease    Diabetes mellitus without complication (HCC)    HTN (hypertension)    Hyperlipidemia    Nausea and vomiting 08/01/2021   OSA (obstructive sleep apnea)    mild obstructive sleep apnea with an AHI of 10.8/h with oxygen saturations as low as 85%.   prostate ca 07/2022   Past Surgical History:  Procedure Laterality Date   ANTERIOR CERVICAL DECOMP/DISCECTOMY FUSION N/A 06/03/2021   Procedure: Anterior Cervical Decompression Fusion  Cervical three-four;  Surgeon: Bedelia Person, MD;  Location: [facility] OR;  Service: Neurosurgery;  Laterality: N/A;   colonoscopy  07/14/2003   Dr. Jena Gauss: normal rectum, sigmoid diverticula in colonic mucosa   COLONOSCOPY N/A 02/15/2014   Procedure: COLONOSCOPY;  Surgeon: Corbin Ade, MD;  [facility];  Service: Endoscopy;  Laterality: N/A;  2:00   COLONOSCOPY WITH PROPOFOL N/A 05/08/2022   Procedure: COLONOSCOPY WITH PROPOFOL;  Surgeon: Corbin Ade, MD;  [facility];  Service: Endoscopy;  Laterality: N/A;  11:00 am   LYMPHADENECTOMY Bilateral 12/17/2022   Procedure: BILATERAL PELVIC LYMPHADENECTOMY;  Surgeon: Heloise Purpura, MD;  Location: [facility];   Service: Urology;  Laterality: Bilateral;   PROSTATE BIOPSY     RIGHT/LEFT HEART CATH AND CORONARY ANGIOGRAPHY N/A 05/23/2021   Procedure: RIGHT/LEFT HEART CATH AND CORONARY ANGIOGRAPHY;  Surgeon: Dolores Patty, MD;  [facility];  Service: Cardiovascular;  Laterality: N/A;   ROBOT ASSISTED LAPAROSCOPIC RADICAL PROSTATECTOMY N/A 12/17/2022   Procedure: XI ROBOTIC ASSISTED LAPAROSCOPIC RADICAL PROSTATECTOMY LEVEL 2;  Surgeon: Heloise Purpura, MD;  Location: [facility];  Service: Urology;  Laterality: N/A;  210 MINUTES NEEDED FOR CASE   Patient Active Problem List   Diagnosis Date Noted   CHF (congestive heart failure) (HCC) 03/04/2023   Hyperlipidemia associated with type 2 diabetes mellitus (HCC) 03/04/2023   Lumbar stenosis 03/04/2023   Prostate cancer (HCC) 12/17/2022   Malignant neoplasm of prostate (HCC) 10/22/2022   OSA (obstructive sleep apnea) 04/23/2022   H/O adenomatous polyp of colon 03/27/2022   Normocytic anemia 03/27/2022   Gait abnormality 03/10/2022   Chronic low back pain 03/10/2022   Paresthesia 03/10/2022   CAP (community acquired pneumonia) 08/01/2021   Nausea and vomiting 08/01/2021   DM2 (diabetes mellitus, type 2) (HCC) 08/01/2021   Coronary artery disease involving native coronary artery of native heart without angina pectoris 07/31/2021   Stenosis of cervical spine with myelopathy (HCC) 06/03/2021   Macrocytic anemia 05/15/2021   Bilateral lower extremity edema 05/15/2021   BPH (benign prostatic hyperplasia)  05/15/2021   Elevated PSA 05/15/2021   Cervical spinal stenosis 05/15/2021   DDD (degenerative disc disease), cervical 05/15/2021   Syncope 05/14/2021   Elevated hemoglobin A1c 04/10/2021   GERD (gastroesophageal reflux disease) 08/29/2017   Encounter for screening colonoscopy 02/01/2014   Essential hypertension 08/14/2008   Seasonal and perennial allergic rhinitis 04/23/2007    PCP: Billie Lade, MD  REFERRING PROVIDER: Janeece Riggers, FNP  REFERRING DIAG: [phone] (ICD-10-CM) - Spinal stenosis, lumbar region without neurogenic claudication   Rationale for Evaluation and Treatment: Rehabilitation  THERAPY DIAG:  Other low back pain  Muscle weakness (generalized)  Other abnormalities of gait and mobility  ONSET DATE: Chronic  SUBJECTIVE:                                                                                                                                                                                           SUBJECTIVE STATEMENT: Pt presents to PT with reports of chronic LBP and discomfort. Of note, he states that his LBP has decreased since the summer when he had his prostate removed due to prostate cancer. Now only has pain with rolling over in bed or if he sits for a long period of time. Does have fairly consistent numbness in L lateral thigh, does not go past the knee. Since starting gabapentin these symptoms have decreased, but he does feel as though his R knee is buckling more since starting that medication.   PERTINENT HISTORY:  DM II, HTN, Cancer, CHF, ACDF 2022  PAIN:  Are you having pain?  Yes: NPRS scale: 0/10 Worst: 2/10 Pain location: lower back Pain description: sharp Aggravating factors: prolonged sitting Relieving factors: medication, movement  PRECAUTIONS: None  RED FLAGS: None   WEIGHT BEARING RESTRICTIONS: No  FALLS:  Has patient fallen in last 6 months? 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[place], [place]  PLOF: Independent  PATIENT GOALS: decrease back pain, start a good exercise program  OBJECTIVE:   DIAGNOSTIC FINDINGS:  See imaging in referral notes  PATIENT SURVEYS:  FOTO: 67% function; 72% predicted  COGNITION: Overall cognitive status: Within functional limits for tasks  assessed     SENSATION: Light touch: Impaired - lateral L thigh  MUSCLE LENGTH: Hamstrings: Right DNT; Left DNT Maisie Fus test: Right (+); Left (+)  POSTURE: rounded shoulders, forward head, and increased lumbar lordosis  PALPATION: TTP to R gluteals  LUMBAR ROM:   AROM eval  Flexion WFL  Extension WFL  Right  lateral flexion   Left lateral flexion   Right rotation 50% reduced  Left rotation WFL   (Blank rows = not tested)  LOWER EXTREMITY MMT:    MMT Right eval Left eval  Hip flexion 3+/5 4/5  Hip extension    Hip abduction 3+/5 3+/5  Hip adduction    Hip internal rotation    Hip external rotation    Knee flexion 4/5 5/5  Knee extension 4/5 5/5  Ankle dorsiflexion    Ankle plantarflexion    Ankle inversion    Ankle eversion     (Blank rows = not tested)  LUMBAR SPECIAL TESTS:  Straight leg raise test: Negative and Slump test: Negative  FUNCTIONAL TESTS:  30 Second Sit to Stand: 8 reps  GAIT: Distance walked: 41ft Assistive device utilized: Single point cane Level of assistance: Modified independence Comments: antalgic gait to R  TREATMENT: [facility] Adult PT Treatment:                                                DATE: 04/05/2023 Therapeutic Exercise: SLR x 5 each Supine clamshell x 10 blue band Bridge with blue band x 10 LTR x 5 each Modified thomas stretch x 60" R  PATIENT EDUCATION:  Education details: eval findings, FOTO, HEP, POC Person educated: Patient Education method: Explanation, Demonstration, and Handouts Education comprehension: verbalized understanding and returned demonstration  HOME EXERCISE PROGRAM: Access Code: [email or web address] URL: [email or web address] Date: 04/05/2023 Prepared by: Edwinna Areola  Exercises - Active Straight Leg Raise with Quad Set  - 1 x daily - 7 x weekly - 3 sets - 10 reps - Hooklying Clamshell with Resistance  - 1 x daily - 7 x weekly - 3 sets - 10 reps - blue band hold - Supine Bridge with  Resistance Band  - 1 x daily - 7 x weekly - 3 sets - 10 reps - blue band hold - Supine Lower Trunk Rotation  - 1 x daily - 7 x weekly - 2 sets - 10 reps - 5 sec hold - Modified Thomas Stretch  - 1 x daily - 7 x weekly - 2 reps - 60 sec hold  ASSESSMENT:  CLINICAL IMPRESSION: Patient is a 67 y.o. M who was seen today for physical therapy evaluation and treatment for chronic LBP. Physical findings are consistent with referring provider impression as pt demonstrates decrease in core/proximal hip strength, functional mobility, and hip flexor tightness. FOTO score demonstrates decrease in subjective functional ability below PLOF. Pt would benefit from skilled PT working on improving strength and functional mobility.    OBJECTIVE IMPAIRMENTS: Abnormal gait, decreased activity tolerance, decreased endurance, decreased mobility, difficulty walking, decreased ROM, decreased strength, and pain.   ACTIVITY LIMITATIONS: lifting, bending, sitting, standing, squatting, sleeping, stairs, transfers, and locomotion level  PARTICIPATION LIMITATIONS: driving, shopping, community activity, occupation, and yard work  PERSONAL FACTORS: 3+ comorbidities: DM II, HTN, Cancer, CHF, ACDF 2022  are also affecting patient's functional outcome.   REHAB POTENTIAL: Excellent  CLINICAL DECISION MAKING: Evolving/moderate complexity  EVALUATION COMPLEXITY: Moderate   GOALS: Goals reviewed with patient? No  SHORT TERM GOALS: Target date: 04/26/2023   Pt will be compliant and knowledgeable with initial HEP for improved comfort and carryover Baseline: initial HEP given  Goal status: INITIAL  2.  Pt will self report lower back pain no greater than 1/10 for  improved comfort and functional ability Baseline: 2/10 at worst Goal status: INITIAL   LONG TERM GOALS: Target date: 05/31/2023   Pt will improve FOTO function score to no less than 72% as proxy for functional improvement Baseline: 67% function Goal status:  INITIAL   2.  Pt will self report lower back pain no greater than 0/10 for improved comfort and functional ability Baseline: 2/10 at worst Goal status: INITIAL   3.  Pt will increase 30 Second Sit to Stand rep count to no less than 10 reps for improved balance, strength, and functional mobility Baseline: 8 reps  Goal status: INITIAL   4.  Pt will improve all LE MMT to no less than 5/5 for improved comfort and functional mobility with decreased back pain Baseline: see MMT chart Goal status: INITIAL   PLAN:  PT FREQUENCY: 1x/week  PT DURATION: 8 weeks  PLANNED INTERVENTIONS: Therapeutic exercises, Therapeutic activity, Neuromuscular re-education, Balance training, Gait training, Patient/Family education, Self Care, Joint mobilization, Aquatic Therapy, Dry Needling, Electrical stimulation, Cryotherapy, Moist heat, Manual therapy, and Re-evaluation.  PLAN FOR NEXT SESSION: assess HEP response, core/hip strengthening, hip flexor stretching   Eloy End, PT 04/05/2023, 5:01 PM

## 2023-04-07 ENCOUNTER — Other Ambulatory Visit: Payer: Self-pay

## 2023-04-07 DIAGNOSIS — D649 Anemia, unspecified: Secondary | ICD-10-CM

## 2023-04-14 NOTE — Therapy (Signed)
OUTPATIENT PHYSICAL THERAPY TREATMENT   Patient Name: Andrew Arnold MRN: [id number] DOB:[birth year], 67 y.o., male Today's Date: 04/15/2023   END OF SESSION:  PT End of Session - 04/15/23 1616     Visit Number 2    Number of Visits 9    Date for PT Re-Evaluation 05/31/23    Authorization Type BCBS    PT Start Time 1615    PT Stop Time 1655    PT Time Calculation (min) 40 min    Activity Tolerance Patient tolerated treatment well    Behavior During Therapy WFL for tasks assessed/performed              Past Medical History:  Diagnosis Date   Allergic rhinitis due to pollen    on allergy shots   Anemia    Arrhythmia    1980s hospitalized for HTN and ?irregular rhythm but no specific dx but resolved during hospitalization   Coronary artery disease    Diabetes mellitus without complication (HCC)    HTN (hypertension)    Hyperlipidemia    Nausea and vomiting 08/01/2021   OSA (obstructive sleep apnea)    mild obstructive sleep apnea with an AHI of 10.8/h with oxygen saturations as low as 85%.   prostate ca 07/2022   Past Surgical History:  Procedure Laterality Date   ANTERIOR CERVICAL DECOMP/DISCECTOMY FUSION N/A 06/03/2021   Procedure: Anterior Cervical Decompression Fusion  Cervical three-four;  Surgeon: Bedelia Person, MD;  Location: [facility];  Service: Neurosurgery;  Laterality: N/A;   colonoscopy  07/14/2003   Dr. Jena Gauss: normal rectum, sigmoid diverticula in colonic mucosa   COLONOSCOPY N/A 02/15/2014   Procedure: COLONOSCOPY;  Surgeon: Corbin Ade, MD;  Location: [facility];  Service: Endoscopy;  Laterality: N/A;  2:00   COLONOSCOPY WITH PROPOFOL N/A 05/08/2022   Procedure: COLONOSCOPY WITH PROPOFOL;  Surgeon: Corbin Ade, MD;  Location: [facility];  Service: Endoscopy;  Laterality: N/A;  11:00 am   LYMPHADENECTOMY Bilateral 12/17/2022   Procedure: BILATERAL PELVIC LYMPHADENECTOMY;  Surgeon: Heloise Purpura, MD;  Location: [facility];  Service:  Urology;  Laterality: Bilateral;   PROSTATE BIOPSY     RIGHT/LEFT HEART CATH AND CORONARY ANGIOGRAPHY N/A 05/23/2021   Procedure: RIGHT/LEFT HEART CATH AND CORONARY ANGIOGRAPHY;  Surgeon: Dolores Patty, MD;  Location: [facility];  Service: Cardiovascular;  Laterality: N/A;   ROBOT ASSISTED LAPAROSCOPIC RADICAL PROSTATECTOMY N/A 12/17/2022   Procedure: XI ROBOTIC ASSISTED LAPAROSCOPIC RADICAL PROSTATECTOMY LEVEL 2;  Surgeon: Heloise Purpura, MD;  Location: [facility];  Service: Urology;  Laterality: N/A;  210 MINUTES NEEDED FOR CASE   Patient Active Problem List   Diagnosis Date Noted   CHF (congestive heart failure) (HCC) 03/04/2023   Hyperlipidemia associated with type 2 diabetes mellitus (HCC) 03/04/2023   Lumbar stenosis 03/04/2023   Prostate cancer (HCC) 12/17/2022   Malignant neoplasm of prostate (HCC) 10/22/2022   OSA (obstructive sleep apnea) 04/23/2022   H/O adenomatous polyp of colon 03/27/2022   Normocytic anemia 03/27/2022   Gait abnormality 03/10/2022   Chronic low back pain 03/10/2022   Paresthesia 03/10/2022   CAP (community acquired pneumonia) 08/01/2021   Nausea and vomiting 08/01/2021   DM2 (diabetes mellitus, type 2) (HCC) 08/01/2021   Coronary artery disease involving native coronary artery of native heart without angina pectoris 07/31/2021   Stenosis of cervical spine with myelopathy (HCC) 06/03/2021   Macrocytic anemia 05/15/2021   Bilateral lower extremity edema 05/15/2021   BPH (benign prostatic  hyperplasia) 05/15/2021   Elevated PSA 05/15/2021   Cervical spinal stenosis 05/15/2021   DDD (degenerative disc disease), cervical 05/15/2021   Syncope 05/14/2021   Elevated hemoglobin A1c 04/10/2021   GERD (gastroesophageal reflux disease) 08/29/2017   Encounter for screening colonoscopy 02/01/2014   Essential hypertension 08/14/2008   Seasonal and perennial allergic rhinitis 04/23/2007    PCP: Billie Lade, MD  REFERRING PROVIDER: Janeece Riggers, FNP  REFERRING DIAG: [phone] (ICD-10-CM) - Spinal stenosis, lumbar region without neurogenic claudication   Rationale for Evaluation and Treatment: Rehabilitation  THERAPY DIAG:  Other low back pain  Muscle weakness (generalized)  Other abnormalities of gait and mobility  ONSET DATE: Chronic   SUBJECTIVE:                                                                                                                                                                                          SUBJECTIVE STATEMENT: Patient reports he was given some pills from the neurosurgeon and when he takes them it will make the right knee want to give out and the right knee will feel like a toothache, so the past few days he has not taken the medication and the ache in his right knee has gone away. But other than that, things have been going good. He has been doing the exercises from last visit.   PERTINENT HISTORY:  DM II, HTN, Cancer, CHF, ACDF 2022  PAIN:  Are you having pain?  Yes: NPRS scale: 0/10 Worst: 2/10 Pain location: lower back Pain description: sharp Aggravating factors: prolonged sitting Relieving factors: medication, movement  PRECAUTIONS: None  PATIENT GOALS: decrease back pain, start a good exercise program   OBJECTIVE:  PATIENT SURVEYS:  FOTO: 67% function; 72% predicted   SENSATION: Light touch: Impaired - lateral L thigh  MUSCLE LENGTH: Hamstrings: Right DNT; Left DNT Maisie Fus test: Right (+); Left (+)  POSTURE: rounded shoulders, forward head, and increased lumbar lordosis  PALPATION: TTP to R gluteals  LUMBAR ROM:   AROM eval  Flexion WFL  Extension WFL  Right lateral flexion   Left lateral flexion   Right rotation 50% reduced  Left rotation WFL   (Blank rows = not tested)  LOWER EXTREMITY MMT:    MMT Right eval Left eval  Hip flexion 3+/5 4/5  Hip extension    Hip abduction 3+/5 3+/5  Hip adduction    Hip internal rotation    Hip external  rotation    Knee flexion 4/5 5/5  Knee extension 4/5 5/5  Ankle dorsiflexion    Ankle plantarflexion    Ankle inversion    Ankle eversion     (  Blank rows = not tested)  LUMBAR SPECIAL TESTS:  Straight leg raise test: Negative and Slump test: Negative  FUNCTIONAL TESTS:  30 Second Sit to Stand: 8 reps  GAIT: Distance walked: 55ft Assistive device utilized: Single point cane Level of assistance: Modified independence Comments: antalgic gait to R   TREATMENT: [facility] Adult PT Treatment:                                                DATE: 04/15/2023 Therapeutic Exercise: NuStep L6 x 5 min with UE/LE while taking subjective LTR x 10 - much more limitation with legs rotating to the left Piriformis stretch 3 x 20 sec each Modified thomas stretch 2 x 60 sec each SLR 2 x 10 each Bridge 2 x 10 Hooklying unilateral clamshell with blue 2 x 20 each Sidelying hip abduction 2 x 10 each   [facility] Adult PT Treatment:                                                DATE: 04/05/2023 Therapeutic Exercise: SLR x 5 each Supine clamshell x 10 blue band Bridge with blue band x 10 LTR x 5 each Modified thomas stretch x 60" R  PATIENT EDUCATION:  Education details: HEP Person educated: Patient Education method: Programmer, multimedia, Demonstration Education comprehension: verbalized understanding and returned demonstration  HOME EXERCISE PROGRAM: Access Code: [email or web address]   ASSESSMENT: CLINICAL IMPRESSION: Patient tolerated therapy well with no adverse effects. Therapy focused on progressing his flexibility and strength with good tolerance. He seems to be progressing well with his exercises and did not report any increase in his lower back pain with therapy. He does exhibit limited mobility and greater difficulty performing exercises on the right side. No changes were made to his HEP this visit. He was encouraged to contact his referring provider regarding right knee pain side effect from medication. Patient  would benefit from continued skilled PT to progress his mobility and strength in order to reduce pain and maximize functional ability.   OBJECTIVE IMPAIRMENTS: Abnormal gait, decreased activity tolerance, decreased endurance, decreased mobility, difficulty walking, decreased ROM, decreased strength, and pain.   ACTIVITY LIMITATIONS: lifting, bending, sitting, standing, squatting, sleeping, stairs, transfers, and locomotion level  PARTICIPATION LIMITATIONS: driving, shopping, community activity, occupation, and yard work  PERSONAL FACTORS: 3+ comorbidities: DM II, HTN, Cancer, CHF, ACDF 2022  are also affecting patient's functional outcome.    GOALS: Goals reviewed with patient? No  SHORT TERM GOALS: Target date: 04/26/2023   Pt will be compliant and knowledgeable with initial HEP for improved comfort and carryover Baseline: initial HEP given  Goal status: INITIAL  2.  Pt will self report lower back pain no greater than 1/10 for improved comfort and functional ability Baseline: 2/10 at worst Goal status: INITIAL   LONG TERM GOALS: Target date: 05/31/2023   Pt will improve FOTO function score to no less than 72% as proxy for functional improvement Baseline: 67% function Goal status: INITIAL   2.  Pt will self report lower back pain no greater than 0/10 for improved comfort and functional ability Baseline: 2/10 at worst Goal status: INITIAL   3.  Pt will increase 30 Second Sit to Stand rep count to no  less than 10 reps for improved balance, strength, and functional mobility Baseline: 8 reps  Goal status: INITIAL   4.  Pt will improve all LE MMT to no less than 5/5 for improved comfort and functional mobility with decreased back pain Baseline: see MMT chart Goal status: INITIAL   PLAN: PT FREQUENCY: 1x/week  PT DURATION: 8 weeks  PLANNED INTERVENTIONS: Therapeutic exercises, Therapeutic activity, Neuromuscular re-education, Balance training, Gait training, Patient/Family  education, Self Care, Joint mobilization, Aquatic Therapy, Dry Needling, Electrical stimulation, Cryotherapy, Moist heat, Manual therapy, and Re-evaluation.  PLAN FOR NEXT SESSION: assess HEP response, core/hip strengthening, hip flexor stretching   Rosana Hoes, PT, DPT, LAT, ATC 04/15/23  4:57 PM Phone: [phone] Fax: [phone]

## 2023-04-15 ENCOUNTER — Encounter: Payer: Self-pay | Admitting: Physical Therapy

## 2023-04-15 ENCOUNTER — Other Ambulatory Visit: Payer: Self-pay

## 2023-04-15 ENCOUNTER — Ambulatory Visit: Payer: BC Managed Care – PPO | Attending: Nurse Practitioner | Admitting: Physical Therapy

## 2023-04-15 DIAGNOSIS — M5459 Other low back pain: Secondary | ICD-10-CM | POA: Diagnosis not present

## 2023-04-15 DIAGNOSIS — M6281 Muscle weakness (generalized): Secondary | ICD-10-CM | POA: Insufficient documentation

## 2023-04-15 DIAGNOSIS — R2689 Other abnormalities of gait and mobility: Secondary | ICD-10-CM | POA: Insufficient documentation

## 2023-04-20 DIAGNOSIS — B351 Tinea unguium: Secondary | ICD-10-CM | POA: Diagnosis not present

## 2023-04-22 ENCOUNTER — Ambulatory Visit: Payer: BC Managed Care – PPO

## 2023-04-22 DIAGNOSIS — M5459 Other low back pain: Secondary | ICD-10-CM | POA: Diagnosis not present

## 2023-04-22 DIAGNOSIS — M6281 Muscle weakness (generalized): Secondary | ICD-10-CM

## 2023-04-22 DIAGNOSIS — R2689 Other abnormalities of gait and mobility: Secondary | ICD-10-CM | POA: Diagnosis not present

## 2023-04-22 NOTE — Therapy (Signed)
OUTPATIENT PHYSICAL THERAPY TREATMENT   Patient Name: Andrew Arnold MRN: [id number] DOB:1955-11-26, 67 y.o., male Today's Date: 04/23/2023   END OF SESSION:  PT End of Session - 04/22/23 1612     Visit Number 3    Number of Visits 9    Date for PT Re-Evaluation 05/31/23    Authorization Type BCBS    PT Start Time 1615    PT Stop Time 1658    PT Time Calculation (min) 43 min    Activity Tolerance Patient tolerated treatment well    Behavior During Therapy WFL for tasks assessed/performed               Past Medical History:  Diagnosis Date   Allergic rhinitis due to pollen    on allergy shots   Anemia    Arrhythmia    1980s hospitalized for HTN and ?irregular rhythm but no specific dx but resolved during hospitalization   Coronary artery disease    Diabetes mellitus without complication (HCC)    HTN (hypertension)    Hyperlipidemia    Nausea and vomiting 08/01/2021   OSA (obstructive sleep apnea)    mild obstructive sleep apnea with an AHI of 10.8/h with oxygen saturations as low as 85%.   prostate ca 07/2022   Past Surgical History:  Procedure Laterality Date   ANTERIOR CERVICAL DECOMP/DISCECTOMY FUSION N/A 06/03/2021   Procedure: Anterior Cervical Decompression Fusion  Cervical three-four;  Surgeon: Bedelia Person, MD;  Location: [facility] OR;  Service: Neurosurgery;  Laterality: N/A;   colonoscopy  07/14/2003   Dr. Jena Gauss: normal rectum, sigmoid diverticula in colonic mucosa   COLONOSCOPY N/A 02/15/2014   Procedure: COLONOSCOPY;  Surgeon: Corbin Ade, MD;  Location: [facility];  Service: Endoscopy;  Laterality: N/A;  2:00   COLONOSCOPY WITH PROPOFOL N/A 05/08/2022   Procedure: COLONOSCOPY WITH PROPOFOL;  Surgeon: Corbin Ade, MD;  Location: [facility];  Service: Endoscopy;  Laterality: N/A;  11:00 am   LYMPHADENECTOMY Bilateral 12/17/2022   Procedure: BILATERAL PELVIC LYMPHADENECTOMY;  Surgeon: Heloise Purpura, MD;  Location: [facility];  Service:  Urology;  Laterality: Bilateral;   PROSTATE BIOPSY     RIGHT/LEFT HEART CATH AND CORONARY ANGIOGRAPHY N/A 05/23/2021   Procedure: RIGHT/LEFT HEART CATH AND CORONARY ANGIOGRAPHY;  Surgeon: Dolores Patty, MD;  Location: [facility];  Service: Cardiovascular;  Laterality: N/A;   ROBOT ASSISTED LAPAROSCOPIC RADICAL PROSTATECTOMY N/A 12/17/2022   Procedure: XI ROBOTIC ASSISTED LAPAROSCOPIC RADICAL PROSTATECTOMY LEVEL 2;  Surgeon: Heloise Purpura, MD;  Location: [facility];  Service: Urology;  Laterality: N/A;  210 MINUTES NEEDED FOR CASE   Patient Active Problem List   Diagnosis Date Noted   CHF (congestive heart failure) (HCC) 03/04/2023   Hyperlipidemia associated with type 2 diabetes mellitus (HCC) 03/04/2023   Lumbar stenosis 03/04/2023   Prostate cancer (HCC) 12/17/2022   Malignant neoplasm of prostate (HCC) 10/22/2022   OSA (obstructive sleep apnea) 04/23/2022   H/O adenomatous polyp of colon 03/27/2022   Normocytic anemia 03/27/2022   Gait abnormality 03/10/2022   Chronic low back pain 03/10/2022   Paresthesia 03/10/2022   CAP (community acquired pneumonia) 08/01/2021   Nausea and vomiting 08/01/2021   DM2 (diabetes mellitus, type 2) (HCC) 08/01/2021   Coronary artery disease involving native coronary artery of native heart without angina pectoris 07/31/2021   Stenosis of cervical spine with myelopathy (HCC) 06/03/2021   Macrocytic anemia 05/15/2021   Bilateral lower extremity edema 05/15/2021   BPH (benign  prostatic hyperplasia) 05/15/2021   Elevated PSA 05/15/2021   Cervical spinal stenosis 05/15/2021   DDD (degenerative disc disease), cervical 05/15/2021   Syncope 05/14/2021   Elevated hemoglobin A1c 04/10/2021   GERD (gastroesophageal reflux disease) 08/29/2017   Encounter for screening colonoscopy 02/01/2014   Essential hypertension 08/14/2008   Seasonal and perennial allergic rhinitis 04/23/2007    PCP: Billie Lade, MD  REFERRING PROVIDER: Janeece Riggers, FNP  REFERRING DIAG: [phone] (ICD-10-CM) - Spinal stenosis, lumbar region without neurogenic claudication   Rationale for Evaluation and Treatment: Rehabilitation  THERAPY DIAG:  Other low back pain  Muscle weakness (generalized)  Other abnormalities of gait and mobility  ONSET DATE: Chronic   SUBJECTIVE:                                                                                                                                                                                          SUBJECTIVE STATEMENT: Pt presents to PT with reports of R hip pain. Has been compliant with HEP with no adverse effect.   PERTINENT HISTORY:  DM II, HTN, Cancer, CHF, ACDF 2022  PAIN:  Are you having pain?  Yes: NPRS scale: 5/10 Worst: 2/10 Pain location: lower back Pain description: sharp Aggravating factors: prolonged sitting Relieving factors: medication, movement  PRECAUTIONS: None  PATIENT GOALS: decrease back pain, start a good exercise program   OBJECTIVE:  PATIENT SURVEYS:  FOTO: 67% function; 72% predicted   SENSATION: Light touch: Impaired - lateral L thigh  MUSCLE LENGTH: Hamstrings: Right DNT; Left DNT Maisie Fus test: Right (+); Left (+)  POSTURE: rounded shoulders, forward head, and increased lumbar lordosis  PALPATION: TTP to R gluteals  LUMBAR ROM:   AROM eval  Flexion WFL  Extension WFL  Right lateral flexion   Left lateral flexion   Right rotation 50% reduced  Left rotation WFL   (Blank rows = not tested)  LOWER EXTREMITY MMT:    MMT Right eval Left eval  Hip flexion 3+/5 4/5  Hip extension    Hip abduction 3+/5 3+/5  Hip adduction    Hip internal rotation    Hip external rotation    Knee flexion 4/5 5/5  Knee extension 4/5 5/5  Ankle dorsiflexion    Ankle plantarflexion    Ankle inversion    Ankle eversion     (Blank rows = not tested)  LUMBAR SPECIAL TESTS:  Straight leg raise test: Negative and Slump test: Negative  FUNCTIONAL  TESTS:  30 Second Sit to Stand: 8 reps  GAIT: Distance walked: 29ft Assistive device utilized: Single point cane Level of assistance: Modified independence Comments: antalgic gait  to R   TREATMENT: [facility] Adult PT Treatment:                                                DATE: 04/22/2023 Therapeutic Exercise: NuStep L6 x 5 min with UE/LE while taking subjective Modified thomas stretch x 60" R Bridge with GTB 3x10 Supine clamshell 2x15 GTB Supine pilates SLR 2x10 each Supine 90/90 tabl etop 3x15" Seated knee ext 2x10 20# Seated knee flexion 2x10 50# Lateral walk RTB x 3 laps at counter  [facility] Adult PT Treatment:                                                DATE: 04/15/2023 Therapeutic Exercise: NuStep L6 x 5 min with UE/LE while taking subjective LTR x 10 - much more limitation with legs rotating to the left Piriformis stretch 3 x 20 sec each Modified thomas stretch 2 x 60 sec each SLR 2 x 10 each Bridge 2 x 10 Hooklying unilateral clamshell with blue 2 x 20 each Sidelying hip abduction 2 x 10 each   [facility] Adult PT Treatment:                                                DATE: 04/05/2023 Therapeutic Exercise: SLR x 5 each Supine clamshell x 10 blue band Bridge with blue band x 10 LTR x 5 each Modified thomas stretch x 60" R  PATIENT EDUCATION:  Education details: HEP Person educated: Patient Education method: Programmer, multimedia, Demonstration Education comprehension: verbalized understanding and returned demonstration  HOME EXERCISE PROGRAM: Access Code: [email or web address]   ASSESSMENT: CLINICAL IMPRESSION: Pt was able to complete prescribed exercises with no adverse effect. Therapy once again focused on core and hip strengthening. HEP updated for continued lateral hip strengthening. Will continue to progress as able per POC.    OBJECTIVE IMPAIRMENTS: Abnormal gait, decreased activity tolerance, decreased endurance, decreased mobility, difficulty walking, decreased ROM, decreased  strength, and pain.   ACTIVITY LIMITATIONS: lifting, bending, sitting, standing, squatting, sleeping, stairs, transfers, and locomotion level  PARTICIPATION LIMITATIONS: driving, shopping, community activity, occupation, and yard work  PERSONAL FACTORS: 3+ comorbidities: DM II, HTN, Cancer, CHF, ACDF 2022  are also affecting patient's functional outcome.    GOALS: Goals reviewed with patient? No  SHORT TERM GOALS: Target date: 04/26/2023   Pt will be compliant and knowledgeable with initial HEP for improved comfort and carryover Baseline: initial HEP given  Goal status: INITIAL  2.  Pt will self report lower back pain no greater than 1/10 for improved comfort and functional ability Baseline: 2/10 at worst Goal status: INITIAL   LONG TERM GOALS: Target date: 05/31/2023   Pt will improve FOTO function score to no less than 72% as proxy for functional improvement Baseline: 67% function Goal status: INITIAL   2.  Pt will self report lower back pain no greater than 0/10 for improved comfort and functional ability Baseline: 2/10 at worst Goal status: INITIAL   3.  Pt will increase 30 Second Sit to Stand rep count to no less than 10 reps for improved balance,  strength, and functional mobility Baseline: 8 reps  Goal status: INITIAL   4.  Pt will improve all LE MMT to no less than 5/5 for improved comfort and functional mobility with decreased back pain Baseline: see MMT chart Goal status: INITIAL   PLAN: PT FREQUENCY: 1x/week  PT DURATION: 8 weeks  PLANNED INTERVENTIONS: Therapeutic exercises, Therapeutic activity, Neuromuscular re-education, Balance training, Gait training, Patient/Family education, Self Care, Joint mobilization, Aquatic Therapy, Dry Needling, Electrical stimulation, Cryotherapy, Moist heat, Manual therapy, and Re-evaluation.  PLAN FOR NEXT SESSION: assess HEP response, core/hip strengthening, hip flexor stretching   Eloy End PT  04/23/23 7:45  AM

## 2023-04-23 ENCOUNTER — Ambulatory Visit: Payer: BC Managed Care – PPO

## 2023-04-27 ENCOUNTER — Ambulatory Visit: Payer: BC Managed Care – PPO

## 2023-04-27 DIAGNOSIS — M5459 Other low back pain: Secondary | ICD-10-CM | POA: Diagnosis not present

## 2023-04-27 DIAGNOSIS — R2689 Other abnormalities of gait and mobility: Secondary | ICD-10-CM | POA: Diagnosis not present

## 2023-04-27 DIAGNOSIS — M6281 Muscle weakness (generalized): Secondary | ICD-10-CM

## 2023-04-27 NOTE — Therapy (Signed)
OUTPATIENT PHYSICAL THERAPY TREATMENT   Patient Name: Andrew Arnold MRN: [id number] DOB:17-Dec-1955, 67 y.o., male Today's Date: 04/27/2023   END OF SESSION:  PT End of Session - 04/27/23 1615     Visit Number 4    Number of Visits 9    Date for PT Re-Evaluation 05/31/23    Authorization Type BCBS    PT Start Time 1615    PT Stop Time 1655    PT Time Calculation (min) 40 min    Activity Tolerance Patient tolerated treatment well    Behavior During Therapy WFL for tasks assessed/performed                Past Medical History:  Diagnosis Date   Allergic rhinitis due to pollen    on allergy shots   Anemia    Arrhythmia    1980s hospitalized for HTN and ?irregular rhythm but no specific dx but resolved during hospitalization   Coronary artery disease    Diabetes mellitus without complication (HCC)    HTN (hypertension)    Hyperlipidemia    Nausea and vomiting 08/01/2021   OSA (obstructive sleep apnea)    mild obstructive sleep apnea with an AHI of 10.8/h with oxygen saturations as low as 85%.   prostate ca 07/2022   Past Surgical History:  Procedure Laterality Date   ANTERIOR CERVICAL DECOMP/DISCECTOMY FUSION N/A 06/03/2021   Procedure: Anterior Cervical Decompression Fusion  Cervical three-four;  Surgeon: Bedelia Person, MD;  Location: [facility] OR;  Service: Neurosurgery;  Laterality: N/A;   colonoscopy  07/14/2003   Dr. Jena Gauss: normal rectum, sigmoid diverticula in colonic mucosa   COLONOSCOPY N/A 02/15/2014   Procedure: COLONOSCOPY;  Surgeon: Corbin Ade, MD;  Location: [facility];  Service: Endoscopy;  Laterality: N/A;  2:00   COLONOSCOPY WITH PROPOFOL N/A 05/08/2022   Procedure: COLONOSCOPY WITH PROPOFOL;  Surgeon: Corbin Ade, MD;  Location: [facility];  Service: Endoscopy;  Laterality: N/A;  11:00 am   LYMPHADENECTOMY Bilateral 12/17/2022   Procedure: BILATERAL PELVIC LYMPHADENECTOMY;  Surgeon: Heloise Purpura, MD;  Location: [facility];  Service:  Urology;  Laterality: Bilateral;   PROSTATE BIOPSY     RIGHT/LEFT HEART CATH AND CORONARY ANGIOGRAPHY N/A 05/23/2021   Procedure: RIGHT/LEFT HEART CATH AND CORONARY ANGIOGRAPHY;  Surgeon: Dolores Patty, MD;  Location: [facility];  Service: Cardiovascular;  Laterality: N/A;   ROBOT ASSISTED LAPAROSCOPIC RADICAL PROSTATECTOMY N/A 12/17/2022   Procedure: XI ROBOTIC ASSISTED LAPAROSCOPIC RADICAL PROSTATECTOMY LEVEL 2;  Surgeon: Heloise Purpura, MD;  Location: [facility];  Service: Urology;  Laterality: N/A;  210 MINUTES NEEDED FOR CASE   Patient Active Problem List   Diagnosis Date Noted   CHF (congestive heart failure) (HCC) 03/04/2023   Hyperlipidemia associated with type 2 diabetes mellitus (HCC) 03/04/2023   Lumbar stenosis 03/04/2023   Prostate cancer (HCC) 12/17/2022   Malignant neoplasm of prostate (HCC) 10/22/2022   OSA (obstructive sleep apnea) 04/23/2022   H/O adenomatous polyp of colon 03/27/2022   Normocytic anemia 03/27/2022   Gait abnormality 03/10/2022   Chronic low back pain 03/10/2022   Paresthesia 03/10/2022   CAP (community acquired pneumonia) 08/01/2021   Nausea and vomiting 08/01/2021   DM2 (diabetes mellitus, type 2) (HCC) 08/01/2021   Coronary artery disease involving native coronary artery of native heart without angina pectoris 07/31/2021   Stenosis of cervical spine with myelopathy (HCC) 06/03/2021   Macrocytic anemia 05/15/2021   Bilateral lower extremity edema 05/15/2021   BPH (  benign prostatic hyperplasia) 05/15/2021   Elevated PSA 05/15/2021   Cervical spinal stenosis 05/15/2021   DDD (degenerative disc disease), cervical 05/15/2021   Syncope 05/14/2021   Elevated hemoglobin A1c 04/10/2021   GERD (gastroesophageal reflux disease) 08/29/2017   Encounter for screening colonoscopy 02/01/2014   Essential hypertension 08/14/2008   Seasonal and perennial allergic rhinitis 04/23/2007    PCP: Billie Lade, MD  REFERRING PROVIDER: Janeece Riggers, FNP  REFERRING DIAG: [phone] (ICD-10-CM) - Spinal stenosis, lumbar region without neurogenic claudication   Rationale for Evaluation and Treatment: Rehabilitation  THERAPY DIAG:  Other low back pain  Muscle weakness (generalized)  ONSET DATE: Chronic   SUBJECTIVE:                                                                                                                                                                                          SUBJECTIVE STATEMENT: Pt presents to PT with reports of continued LBP. Has been compliant with HEP.   PERTINENT HISTORY:  DM II, HTN, Cancer, CHF, ACDF 2022  PAIN:  Are you having pain?  Yes: NPRS scale: 6/10 Worst: 2/10 Pain location: lower back Pain description: sharp Aggravating factors: prolonged sitting Relieving factors: medication, movement  PRECAUTIONS: None  PATIENT GOALS: decrease back pain, start a good exercise program   OBJECTIVE:  PATIENT SURVEYS:  FOTO: 67% function; 72% predicted   SENSATION: Light touch: Impaired - lateral L thigh  MUSCLE LENGTH: Hamstrings: Right DNT; Left DNT Maisie Fus test: Right (+); Left (+)  POSTURE: rounded shoulders, forward head, and increased lumbar lordosis  PALPATION: TTP to R gluteals  LUMBAR ROM:   AROM eval  Flexion WFL  Extension WFL  Right lateral flexion   Left lateral flexion   Right rotation 50% reduced  Left rotation WFL   (Blank rows = not tested)  LOWER EXTREMITY MMT:    MMT Right eval Left eval  Hip flexion 3+/5 4/5  Hip extension    Hip abduction 3+/5 3+/5  Hip adduction    Hip internal rotation    Hip external rotation    Knee flexion 4/5 5/5  Knee extension 4/5 5/5  Ankle dorsiflexion    Ankle plantarflexion    Ankle inversion    Ankle eversion     (Blank rows = not tested)  LUMBAR SPECIAL TESTS:  Straight leg raise test: Negative and Slump test: Negative  FUNCTIONAL TESTS:  30 Second Sit to Stand: 8 reps  GAIT: Distance walked:  67ft Assistive device utilized: Single point cane Level of assistance: Modified independence Comments: antalgic gait to R   TREATMENT: [facility] Adult PT Treatment:  DATE: 04/27/2023 Therapeutic Exercise: NuStep L6 x 5 min with UE/LE while taking subjective Modified thomas stretch x 60" R Bridge with blue band 3x10 Supine clamshell 2x15 blue band Supine pilates SLR 2x10 each Supine 90/90 table top 2x20" Modified crunch with physioball 2x10 - 3" hold Seated knee ext 2x10 20# Seated knee flexion 2x10 50# Lateral walk RTB x 3 laps at counter Standing hip abd/ext 2x10 RTB Pallof press 2x10 10#  [facility] Adult PT Treatment:                                                DATE: 04/22/2023 Therapeutic Exercise: NuStep L6 x 5 min with UE/LE while taking subjective Modified thomas stretch x 60" R Bridge with GTB 3x10 Supine clamshell 2x15 GTB Supine pilates SLR 2x10 each Supine 90/90 tabl etop 3x15" Seated knee ext 2x10 20# Seated knee flexion 2x10 50# Lateral walk RTB x 3 laps at counter  [facility] Adult PT Treatment:                                                DATE: 04/15/2023 Therapeutic Exercise: NuStep L6 x 5 min with UE/LE while taking subjective LTR x 10 - much more limitation with legs rotating to the left Piriformis stretch 3 x 20 sec each Modified thomas stretch 2 x 60 sec each SLR 2 x 10 each Bridge 2 x 10 Hooklying unilateral clamshell with blue 2 x 20 each Sidelying hip abduction 2 x 10 each  PATIENT EDUCATION:  Education details: HEP Person educated: Patient Education method: Programmer, multimedia, Demonstration Education comprehension: verbalized understanding and returned demonstration  HOME EXERCISE PROGRAM: Access Code: [email or web address]   ASSESSMENT: CLINICAL IMPRESSION: Pt was able to complete prescribed exercises with no adverse effect. Therapy once again focused on core and hip strengthening. Will continue to progress as able per  POC.    OBJECTIVE IMPAIRMENTS: Abnormal gait, decreased activity tolerance, decreased endurance, decreased mobility, difficulty walking, decreased ROM, decreased strength, and pain.   ACTIVITY LIMITATIONS: lifting, bending, sitting, standing, squatting, sleeping, stairs, transfers, and locomotion level  PARTICIPATION LIMITATIONS: driving, shopping, community activity, occupation, and yard work  PERSONAL FACTORS: 3+ comorbidities: DM II, HTN, Cancer, CHF, ACDF 2022  are also affecting patient's functional outcome.    GOALS: Goals reviewed with patient? No  SHORT TERM GOALS: Target date: 04/26/2023   Pt will be compliant and knowledgeable with initial HEP for improved comfort and carryover Baseline: initial HEP given  Goal status: INITIAL  2.  Pt will self report lower back pain no greater than 1/10 for improved comfort and functional ability Baseline: 2/10 at worst Goal status: INITIAL   LONG TERM GOALS: Target date: 05/31/2023   Pt will improve FOTO function score to no less than 72% as proxy for functional improvement Baseline: 67% function Goal status: INITIAL   2.  Pt will self report lower back pain no greater than 0/10 for improved comfort and functional ability Baseline: 2/10 at worst Goal status: INITIAL   3.  Pt will increase 30 Second Sit to Stand rep count to no less than 10 reps for improved balance, strength, and functional mobility Baseline: 8 reps  Goal status: INITIAL   4.  Pt will improve all  LE MMT to no less than 5/5 for improved comfort and functional mobility with decreased back pain Baseline: see MMT chart Goal status: INITIAL   PLAN: PT FREQUENCY: 1x/week  PT DURATION: 8 weeks  PLANNED INTERVENTIONS: Therapeutic exercises, Therapeutic activity, Neuromuscular re-education, Balance training, Gait training, Patient/Family education, Self Care, Joint mobilization, Aquatic Therapy, Dry Needling, Electrical stimulation, Cryotherapy, Moist heat, Manual  therapy, and Re-evaluation.  PLAN FOR NEXT SESSION: assess HEP response, core/hip strengthening, hip flexor stretching   Eloy End PT  04/27/23 5:00 PM

## 2023-04-28 ENCOUNTER — Other Ambulatory Visit (HOSPITAL_COMMUNITY): Payer: Self-pay | Admitting: Internal Medicine

## 2023-04-29 DIAGNOSIS — M62838 Other muscle spasm: Secondary | ICD-10-CM | POA: Diagnosis not present

## 2023-04-29 DIAGNOSIS — N393 Stress incontinence (female) (male): Secondary | ICD-10-CM | POA: Diagnosis not present

## 2023-04-29 DIAGNOSIS — M6281 Muscle weakness (generalized): Secondary | ICD-10-CM | POA: Diagnosis not present

## 2023-04-29 DIAGNOSIS — C61 Malignant neoplasm of prostate: Secondary | ICD-10-CM

## 2023-04-29 NOTE — Progress Notes (Signed)
Patient was presented to the [facility] on 10/23/22 for his stage T1c adenocarcinoma of the prostate with a Gleason's score of 4+3 and a PSA of 6.3.  Patient proceed with treatment recommendations of robotic prostatectomy and had this on 12/17/22.   Patient had has his first post treatment PSA on 03/25/23 at [facility] and will remain under care with Dr. Laverle Patter for continue PSA monitoring.

## 2023-05-04 ENCOUNTER — Ambulatory Visit: Payer: BC Managed Care – PPO

## 2023-05-06 ENCOUNTER — Ambulatory Visit: Payer: BC Managed Care – PPO

## 2023-05-06 DIAGNOSIS — R2689 Other abnormalities of gait and mobility: Secondary | ICD-10-CM | POA: Diagnosis not present

## 2023-05-06 DIAGNOSIS — M5459 Other low back pain: Secondary | ICD-10-CM

## 2023-05-06 DIAGNOSIS — M6281 Muscle weakness (generalized): Secondary | ICD-10-CM

## 2023-05-06 NOTE — Therapy (Signed)
OUTPATIENT PHYSICAL THERAPY TREATMENT   Patient Name: Andrew Arnold MRN: [id number] DOB:1955-09-18, 67 y.o., male Today's Date: 05/07/2023   END OF SESSION:  PT End of Session - 05/06/23 1611     Visit Number 5    Number of Visits 9    Date for PT Re-Evaluation 05/31/23    Authorization Type [employer]BS    PT Start Time 1617    PT Stop Time 1700    PT Time Calculation (min) 43 min    Activity Tolerance Patient tolerated treatment well    Behavior During Therapy WFL for tasks assessed/performed                Past Medical History:  Diagnosis Date   Allergic rhinitis due to pollen    on allergy shots   Anemia    Arrhythmia    1980s hospitalized for HTN and ?irregular rhythm but no specific dx but resolved during hospitalization   Coronary artery disease    Diabetes mellitus without complication (HCC)    HTN (hypertension)    Hyperlipidemia    Nausea and vomiting 08/01/2021   OSA (obstructive sleep apnea)    mild obstructive sleep apnea with an AHI of 10.8/h with oxygen saturations as low as 85%.   prostate ca 07/2022   Past Surgical History:  Procedure Laterality Date   ANTERIOR CERVICAL DECOMP/DISCECTOMY FUSION N/A 06/03/2021   Procedure: Anterior Cervical Decompression Fusion  Cervical three-four;  Surgeon: Bedelia Person, MD;  Location: [facility] OR;  Service: Neurosurgery;  Laterality: N/A;   colonoscopy  07/14/2003   Dr. Jena Gauss: normal rectum, sigmoid diverticula in colonic mucosa   COLONOSCOPY N/A 02/15/2014   Procedure: COLONOSCOPY;  Surgeon: Corbin Ade, MD;  Location: [facility];  Service: Endoscopy;  Laterality: N/A;  2:00   COLONOSCOPY WITH PROPOFOL N/A 05/08/2022   Procedure: COLONOSCOPY WITH PROPOFOL;  Surgeon: Corbin Ade, MD;  Location: [facility];  Service: Endoscopy;  Laterality: N/A;  11:00 am   LYMPHADENECTOMY Bilateral 12/17/2022   Procedure: BILATERAL PELVIC LYMPHADENECTOMY;  Surgeon: Heloise Purpura, MD;  Location: [facility];  Service:  Urology;  Laterality: Bilateral;   PROSTATE BIOPSY     RIGHT/LEFT HEART CATH AND CORONARY ANGIOGRAPHY N/A 05/23/2021   Procedure: RIGHT/LEFT HEART CATH AND CORONARY ANGIOGRAPHY;  Surgeon: Dolores Patty, MD;  Location: [facility];  Service: Cardiovascular;  Laterality: N/A;   ROBOT ASSISTED LAPAROSCOPIC RADICAL PROSTATECTOMY N/A 12/17/2022   Procedure: XI ROBOTIC ASSISTED LAPAROSCOPIC RADICAL PROSTATECTOMY LEVEL 2;  Surgeon: Heloise Purpura, MD;  Location: [facility];  Service: Urology;  Laterality: N/A;  210 MINUTES NEEDED FOR CASE   Patient Active Problem List   Diagnosis Date Noted   CHF (congestive heart failure) (HCC) 03/04/2023   Hyperlipidemia associated with type 2 diabetes mellitus (HCC) 03/04/2023   Lumbar stenosis 03/04/2023   Prostate cancer (HCC) 12/17/2022   Malignant neoplasm of prostate (HCC) 10/22/2022   OSA (obstructive sleep apnea) 04/23/2022   H/O adenomatous polyp of colon 03/27/2022   Normocytic anemia 03/27/2022   Gait abnormality 03/10/2022   Chronic low back pain 03/10/2022   Paresthesia 03/10/2022   CAP (community acquired pneumonia) 08/01/2021   Nausea and vomiting 08/01/2021   DM2 (diabetes mellitus, type 2) (HCC) 08/01/2021   Coronary artery disease involving native coronary artery of native heart without angina pectoris 07/31/2021   Stenosis of cervical spine with myelopathy (HCC) 06/03/2021   Macrocytic anemia 05/15/2021   Bilateral lower extremity edema 05/15/2021   BPH (  benign prostatic hyperplasia) 05/15/2021   Elevated PSA 05/15/2021   Cervical spinal stenosis 05/15/2021   DDD (degenerative disc disease), cervical 05/15/2021   Syncope 05/14/2021   Elevated hemoglobin A1c 04/10/2021   GERD (gastroesophageal reflux disease) 08/29/2017   Encounter for screening colonoscopy 02/01/2014   Essential hypertension 08/14/2008   Seasonal and perennial allergic rhinitis 04/23/2007    PCP: Billie Lade, MD  REFERRING PROVIDER: Janeece Riggers, FNP  REFERRING DIAG: [phone] (ICD-10-CM) - Spinal stenosis, lumbar region without neurogenic claudication   Rationale for Evaluation and Treatment: Rehabilitation  THERAPY DIAG:  Other low back pain  Muscle weakness (generalized)  Other abnormalities of gait and mobility  ONSET DATE: Chronic   SUBJECTIVE:                                                                                                                                                                                          SUBJECTIVE STATEMENT: Pt presents to PT with reports of continued LBP. Has been compliant with HEP.   PERTINENT HISTORY:  DM II, HTN, Cancer, CHF, ACDF 2022  PAIN:  Are you having pain?  Yes: NPRS scale: 6/10 Worst: 2/10 Pain location: lower back Pain description: sharp Aggravating factors: prolonged sitting Relieving factors: medication, movement  PRECAUTIONS: None  PATIENT GOALS: decrease back pain, start a good exercise program   OBJECTIVE:  PATIENT SURVEYS:  FOTO: 67% function; 72% predicted   SENSATION: Light touch: Impaired - lateral L thigh  MUSCLE LENGTH: Hamstrings: Right DNT; Left DNT Maisie Fus test: Right (+); Left (+)  POSTURE: rounded shoulders, forward head, and increased lumbar lordosis  PALPATION: TTP to R gluteals  LUMBAR ROM:   AROM eval  Flexion WFL  Extension WFL  Right lateral flexion   Left lateral flexion   Right rotation 50% reduced  Left rotation WFL   (Blank rows = not tested)  LOWER EXTREMITY MMT:    MMT Right eval Left eval  Hip flexion 3+/5 4/5  Hip extension    Hip abduction 3+/5 3+/5  Hip adduction    Hip internal rotation    Hip external rotation    Knee flexion 4/5 5/5  Knee extension 4/5 5/5  Ankle dorsiflexion    Ankle plantarflexion    Ankle inversion    Ankle eversion     (Blank rows = not tested)  LUMBAR SPECIAL TESTS:  Straight leg raise test: Negative and Slump test: Negative  FUNCTIONAL TESTS:  30 Second Sit  to Stand: 8 reps  GAIT: Distance walked: 48ft Assistive device utilized: Single point cane Level of assistance: Modified independence Comments: antalgic gait to R  TREATMENT: [facility] Adult PT Treatment:                                                DATE: 05/06/2023 Therapeutic Exercise: NuStep L6 x 5 min with UE/LE while taking subjective Supine clamshell x 20 black band Bridge with black band 2x10 Supine pilates SLR 2x15 each Supine 90/90 table top 2x25" Modified crunch with physioball 2x10 - 3" hold Seated knee ext 2x10 20# Seated knee flexion 2x10 50# Lateral walk RTB x 3 laps at counter Standing hip abd/ext 2x10 RTB Pallof press 2x10 10#  [facility] Adult PT Treatment:                                                DATE: 04/27/2023 Therapeutic Exercise: NuStep L6 x 5 min with UE/LE while taking subjective Modified thomas stretch x 60" R Bridge with blue band 3x10 Supine clamshell 2x15 blue band Supine pilates SLR 2x10 each Supine 90/90 table top 2x20" Modified crunch with physioball 2x10 - 3" hold Seated knee ext 2x10 20# Seated knee flexion 2x10 50# Lateral walk RTB x 3 laps at counter Standing hip abd/ext 2x10 RTB Pallof press 2x10 10#  [facility] Adult PT Treatment:                                                DATE: 04/22/2023 Therapeutic Exercise: NuStep L6 x 5 min with UE/LE while taking subjective Modified thomas stretch x 60" R Bridge with GTB 3x10 Supine clamshell 2x15 GTB Supine pilates SLR 2x10 each Supine 90/90 tabl etop 3x15" Seated knee ext 2x10 20# Seated knee flexion 2x10 50# Lateral walk RTB x 3 laps at counter  [facility] Adult PT Treatment:                                                DATE: 04/15/2023 Therapeutic Exercise: NuStep L6 x 5 min with UE/LE while taking subjective LTR x 10 - much more limitation with legs rotating to the left Piriformis stretch 3 x 20 sec each Modified thomas stretch 2 x 60 sec each SLR 2 x 10 each Bridge 2 x 10 Hooklying  unilateral clamshell with blue 2 x 20 each Sidelying hip abduction 2 x 10 each  PATIENT EDUCATION:  Education details: HEP Person educated: Patient Education method: Programmer, multimedia, Demonstration Education comprehension: verbalized understanding and returned demonstration  HOME EXERCISE PROGRAM: Access Code: [email or web address]   ASSESSMENT: CLINICAL IMPRESSION: Pt was able to complete prescribed exercises with no adverse effect. Therapy once again focused on core and hip strengthening with increased intensity. Will continue to progress as able per POC.    OBJECTIVE IMPAIRMENTS: Abnormal gait, decreased activity tolerance, decreased endurance, decreased mobility, difficulty walking, decreased ROM, decreased strength, and pain.   ACTIVITY LIMITATIONS: lifting, bending, sitting, standing, squatting, sleeping, stairs, transfers, and locomotion level  PARTICIPATION LIMITATIONS: driving, shopping, community activity, occupation, and yard work  PERSONAL FACTORS: 3+ comorbidities: DM II, HTN, Cancer, CHF, ACDF  2022  are also affecting patient's functional outcome.    GOALS: Goals reviewed with patient? No  SHORT TERM GOALS: Target date: 04/26/2023   Pt will be compliant and knowledgeable with initial HEP for improved comfort and carryover Baseline: initial HEP given  Goal status: INITIAL  2.  Pt will self report lower back pain no greater than 1/10 for improved comfort and functional ability Baseline: 2/10 at worst Goal status: INITIAL   LONG TERM GOALS: Target date: 05/31/2023   Pt will improve FOTO function score to no less than 72% as proxy for functional improvement Baseline: 67% function Goal status: INITIAL   2.  Pt will self report lower back pain no greater than 0/10 for improved comfort and functional ability Baseline: 2/10 at worst Goal status: INITIAL   3.  Pt will increase 30 Second Sit to Stand rep count to no less than 10 reps for improved balance, strength, and functional  mobility Baseline: 8 reps  Goal status: INITIAL   4.  Pt will improve all LE MMT to no less than 5/5 for improved comfort and functional mobility with decreased back pain Baseline: see MMT chart Goal status: INITIAL   PLAN: PT FREQUENCY: 1x/week  PT DURATION: 8 weeks  PLANNED INTERVENTIONS: Therapeutic exercises, Therapeutic activity, Neuromuscular re-education, Balance training, Gait training, Patient/Family education, Self Care, Joint mobilization, Aquatic Therapy, Dry Needling, Electrical stimulation, Cryotherapy, Moist heat, Manual therapy, and Re-evaluation.  PLAN FOR NEXT SESSION: assess HEP response, core/hip strengthening, hip flexor stretching   Eloy End PT  05/07/23 8:06 AM

## 2023-05-11 ENCOUNTER — Encounter: Payer: Self-pay | Admitting: *Deleted

## 2023-05-11 DIAGNOSIS — R200 Anesthesia of skin: Secondary | ICD-10-CM | POA: Diagnosis not present

## 2023-05-24 ENCOUNTER — Ambulatory Visit: Payer: BC Managed Care – PPO

## 2023-05-25 ENCOUNTER — Encounter: Payer: Self-pay | Admitting: Physical Therapy

## 2023-05-25 ENCOUNTER — Other Ambulatory Visit: Payer: Self-pay

## 2023-05-25 ENCOUNTER — Ambulatory Visit: Payer: BC Managed Care – PPO | Attending: Nurse Practitioner | Admitting: Physical Therapy

## 2023-05-25 DIAGNOSIS — R2689 Other abnormalities of gait and mobility: Secondary | ICD-10-CM | POA: Insufficient documentation

## 2023-05-25 DIAGNOSIS — M5459 Other low back pain: Secondary | ICD-10-CM | POA: Insufficient documentation

## 2023-05-25 DIAGNOSIS — M6281 Muscle weakness (generalized): Secondary | ICD-10-CM | POA: Diagnosis not present

## 2023-05-25 DIAGNOSIS — R262 Difficulty in walking, not elsewhere classified: Secondary | ICD-10-CM | POA: Insufficient documentation

## 2023-05-25 NOTE — Therapy (Signed)
OUTPATIENT PHYSICAL THERAPY TREATMENT   Patient Name: Andrew Arnold MRN: [id number] DOB:04-26-1956, 67 y.o., male Today's Date: 05/25/2023   END OF SESSION:  PT End of Session - 05/25/23 1626     Visit Number 6    Number of Visits 9    Date for PT Re-Evaluation 05/31/23    Authorization Type [employer]    PT Start Time 1620    PT Stop Time 1700    PT Time Calculation (min) 40 min    Activity Tolerance Patient tolerated treatment well    Behavior During Therapy WFL for tasks assessed/performed                 Past Medical History:  Diagnosis Date   Allergic rhinitis due to pollen    on allergy shots   Anemia    Arrhythmia    1980s hospitalized for HTN and ?irregular rhythm but no specific dx but resolved during hospitalization   Coronary artery disease    Diabetes mellitus without complication (HCC)    HTN (hypertension)    Hyperlipidemia    Nausea and vomiting 08/01/2021   OSA (obstructive sleep apnea)    mild obstructive sleep apnea with an AHI of 10.8/h with oxygen saturations as low as 85%.   prostate ca 07/2022   Past Surgical History:  Procedure Laterality Date   ANTERIOR CERVICAL DECOMP/DISCECTOMY FUSION N/A 06/03/2021   Procedure: Anterior Cervical Decompression Fusion  Cervical three-four;  Surgeon: Bedelia Person, MD;  Location: [facility] OR;  Service: Neurosurgery;  Laterality: N/A;   colonoscopy  07/14/2003   Dr. Jena Gauss: normal rectum, sigmoid diverticula in colonic mucosa   COLONOSCOPY N/A 02/15/2014   Procedure: COLONOSCOPY;  Surgeon: Corbin Ade, MD;  Location: [facility];  Service: Endoscopy;  Laterality: N/A;  2:00   COLONOSCOPY WITH PROPOFOL N/A 05/08/2022   Procedure: COLONOSCOPY WITH PROPOFOL;  Surgeon: Corbin Ade, MD;  Location: [facility];  Service: Endoscopy;  Laterality: N/A;  11:00 am   LYMPHADENECTOMY Bilateral 12/17/2022   Procedure: BILATERAL PELVIC LYMPHADENECTOMY;  Surgeon: Heloise Purpura, MD;  Location: [facility];  Service:  Urology;  Laterality: Bilateral;   PROSTATE BIOPSY     RIGHT/LEFT HEART CATH AND CORONARY ANGIOGRAPHY N/A 05/23/2021   Procedure: RIGHT/LEFT HEART CATH AND CORONARY ANGIOGRAPHY;  Surgeon: Dolores Patty, MD;  Location: [facility];  Service: Cardiovascular;  Laterality: N/A;   ROBOT ASSISTED LAPAROSCOPIC RADICAL PROSTATECTOMY N/A 12/17/2022   Procedure: XI ROBOTIC ASSISTED LAPAROSCOPIC RADICAL PROSTATECTOMY LEVEL 2;  Surgeon: Heloise Purpura, MD;  Location: [facility];  Service: Urology;  Laterality: N/A;  210 MINUTES NEEDED FOR CASE   Patient Active Problem List   Diagnosis Date Noted   CHF (congestive heart failure) (HCC) 03/04/2023   Hyperlipidemia associated with type 2 diabetes mellitus (HCC) 03/04/2023   Lumbar stenosis 03/04/2023   Prostate cancer (HCC) 12/17/2022   Malignant neoplasm of prostate (HCC) 10/22/2022   OSA (obstructive sleep apnea) 04/23/2022   H/O adenomatous polyp of colon 03/27/2022   Normocytic anemia 03/27/2022   Gait abnormality 03/10/2022   Chronic low back pain 03/10/2022   Paresthesia 03/10/2022   CAP (community acquired pneumonia) 08/01/2021   Nausea and vomiting 08/01/2021   DM2 (diabetes mellitus, type 2) (HCC) 08/01/2021   Coronary artery disease involving native coronary artery of native heart without angina pectoris 07/31/2021   Stenosis of cervical spine with myelopathy (HCC) 06/03/2021   Macrocytic anemia 05/15/2021   Bilateral lower extremity edema 05/15/2021  BPH (benign prostatic hyperplasia) 05/15/2021   Elevated PSA 05/15/2021   Cervical spinal stenosis 05/15/2021   DDD (degenerative disc disease), cervical 05/15/2021   Syncope 05/14/2021   Elevated hemoglobin A1c 04/10/2021   GERD (gastroesophageal reflux disease) 08/29/2017   Encounter for screening colonoscopy 02/01/2014   Essential hypertension 08/14/2008   Seasonal and perennial allergic rhinitis 04/23/2007    PCP: Billie Lade, MD  REFERRING PROVIDER: Janeece Riggers, FNP  REFERRING DIAG: [phone] (ICD-10-CM) - Spinal stenosis, lumbar region without neurogenic claudication   Rationale for Evaluation and Treatment: Rehabilitation  THERAPY DIAG:  Other low back pain  Muscle weakness (generalized)  Other abnormalities of gait and mobility  Difficulty in walking, not elsewhere classified  ONSET DATE: Chronic   SUBJECTIVE:                                                                                                                                                                                          SUBJECTIVE STATEMENT: Patient reports his lower back has been ok, he is having some pain in the front of the pelvic area. States he only has pain when he turns a certain way. He did a mile walk this past weekend and did well.   PERTINENT HISTORY:  DM II, HTN, Cancer, CHF, ACDF 2022  PAIN:  Are you having pain?  Yes: NPRS scale: 2-3/10 Worst: 2/10 Pain location: lower back Pain description: sharp Aggravating factors: prolonged sitting Relieving factors: medication, movement  PRECAUTIONS: None  PATIENT GOALS: decrease back pain, start a good exercise program   OBJECTIVE:  PATIENT SURVEYS:  FOTO: 67% function; 72% predicted  05/25/2023: 72%   SENSATION: Light touch: Impaired - lateral L thigh  MUSCLE LENGTH: Hamstrings: Right DNT; Left DNT Maisie Fus test: Right (+); Left (+)  POSTURE: rounded shoulders, forward head, and increased lumbar lordosis  PALPATION: TTP to R gluteals  LUMBAR ROM:   AROM eval  Flexion WFL  Extension WFL  Right lateral flexion   Left lateral flexion   Right rotation 50% reduced  Left rotation WFL   (Blank rows = not tested)  LOWER EXTREMITY MMT:    MMT Right eval Left eval  Hip flexion 3+/5 4/5  Hip extension    Hip abduction 3+/5 3+/5  Hip adduction    Hip internal rotation    Hip external rotation    Knee flexion 4/5 5/5  Knee extension 4/5 5/5  Ankle dorsiflexion    Ankle  plantarflexion    Ankle inversion    Ankle eversion     (Blank rows = not tested)  LUMBAR SPECIAL TESTS:  Straight leg raise test:  Negative and Slump test: Negative  FUNCTIONAL TESTS:  30 Second Sit to Stand: 8 reps  GAIT: Distance walked: 33ft Assistive device utilized: Single point cane Level of assistance: Modified independence Comments: antalgic gait to R   TREATMENT: [facility] Adult PT Treatment:                                                DATE: 05/25/2023 Therapeutic Exercise: NuStep L6 x 6 min with UE/LE while taking subjective Piriformis stretch 2 x 20 sec each LT 4 x 5 sec each Supine pilates SLR 2 x 15 each Bridge 2 x 10 Hooklying unilateral clamshell with black 3 x 10 each Supine 90/90 table top hold 5 x 10 sec Modified crunch with physioball 2 x 10 with 3 sec hold   [facility] Adult PT Treatment:                                                DATE: 05/06/2023 Therapeutic Exercise: NuStep L6 x 5 min with UE/LE while taking subjective Supine clamshell x 20 black band Bridge with black band 2x10 Supine pilates SLR 2x15 each Supine 90/90 table top 2x25" Modified crunch with physioball 2x10 - 3" hold Seated knee ext 2x10 20# Seated knee flexion 2x10 50# Lateral walk RTB x 3 laps at counter Standing hip abd/ext 2x10 RTB Pallof press 2x10 10#  [facility] Adult PT Treatment:                                                DATE: 04/27/2023 Therapeutic Exercise: NuStep L6 x 5 min with UE/LE while taking subjective Modified thomas stretch x 60" R Bridge with blue band 3x10 Supine clamshell 2x15 blue band Supine pilates SLR 2x10 each Supine 90/90 table top 2x20" Modified crunch with physioball 2x10 - 3" hold Seated knee ext 2x10 20# Seated knee flexion 2x10 50# Lateral walk RTB x 3 laps at counter Standing hip abd/ext 2x10 RTB Pallof press 2x10 10#  [facility] Adult PT Treatment:                                                DATE: 04/22/2023 Therapeutic Exercise: NuStep L6 x  5 min with UE/LE while taking subjective Modified thomas stretch x 60" R Bridge with GTB 3x10 Supine clamshell 2x15 GTB Supine pilates SLR 2x10 each Supine 90/90 tabl etop 3x15" Seated knee ext 2x10 20# Seated knee flexion 2x10 50# Lateral walk RTB x 3 laps at counter  [facility] Adult PT Treatment:                                                DATE: 04/15/2023 Therapeutic Exercise: NuStep L6 x 5 min with UE/LE while taking subjective LTR x 10 - much more limitation with legs rotating to the left Piriformis stretch 3 x  20 sec each Modified thomas stretch 2 x 60 sec each SLR 2 x 10 each Bridge 2 x 10 Hooklying unilateral clamshell with blue 2 x 20 each Sidelying hip abduction 2 x 10 each  PATIENT EDUCATION:  Education details: HEP Person educated: Patient Education method: Programmer, multimedia, Demonstration Education comprehension: verbalized understanding and returned demonstration  HOME EXERCISE PROGRAM: Access Code: [email or web address]   ASSESSMENT: CLINICAL IMPRESSION: Patient tolerated therapy well with no adverse effects. Therapy continues to focus on progressing core and hip strengthening with good tolerance. He does report improvement in his functional status this visit and has achieved his FOTO goal. No changes made to HEP this visit. Patient would benefit from continued skilled PT to progress his mobility and strength in order to reduce pain and maximize functional ability.   OBJECTIVE IMPAIRMENTS: Abnormal gait, decreased activity tolerance, decreased endurance, decreased mobility, difficulty walking, decreased ROM, decreased strength, and pain.   ACTIVITY LIMITATIONS: lifting, bending, sitting, standing, squatting, sleeping, stairs, transfers, and locomotion level  PARTICIPATION LIMITATIONS: driving, shopping, community activity, occupation, and yard work  PERSONAL FACTORS: 3+ comorbidities: DM II, HTN, Cancer, CHF, ACDF 2022  are also affecting patient's functional outcome.     GOALS: Goals reviewed with patient? No  SHORT TERM GOALS: Target date: 04/26/2023   Pt will be compliant and knowledgeable with initial HEP for improved comfort and carryover Baseline: initial HEP given  Goal status: INITIAL  2.  Pt will self report lower back pain no greater than 1/10 for improved comfort and functional ability Baseline: 2/10 at worst Goal status: INITIAL   LONG TERM GOALS: Target date: 05/31/2023   Pt will improve FOTO function score to no less than 72% as proxy for functional improvement Baseline: 67% function 05/25/2023: 72% Goal status: MET  2.  Pt will self report lower back pain no greater than 0/10 for improved comfort and functional ability Baseline: 2/10 at worst Goal status: INITIAL   3.  Pt will increase 30 Second Sit to Stand rep count to no less than 10 reps for improved balance, strength, and functional mobility Baseline: 8 reps  Goal status: INITIAL   4.  Pt will improve all LE MMT to no less than 5/5 for improved comfort and functional mobility with decreased back pain Baseline: see MMT chart Goal status: INITIAL   PLAN: PT FREQUENCY: 1x/week  PT DURATION: 8 weeks  PLANNED INTERVENTIONS: Therapeutic exercises, Therapeutic activity, Neuromuscular re-education, Balance training, Gait training, Patient/Family education, Self Care, Joint mobilization, Aquatic Therapy, Dry Needling, Electrical stimulation, Cryotherapy, Moist heat, Manual therapy, and Re-evaluation.  PLAN FOR NEXT SESSION: assess HEP response, core/hip strengthening, hip flexor stretching   Rosana Hoes, PT, DPT, LAT, ATC 05/25/23  5:03 PM Phone: [phone] Fax: [phone]

## 2023-05-26 DIAGNOSIS — E119 Type 2 diabetes mellitus without complications: Secondary | ICD-10-CM | POA: Diagnosis not present

## 2023-05-26 LAB — HM DIABETES EYE EXAM

## 2023-05-27 DIAGNOSIS — M48061 Spinal stenosis, lumbar region without neurogenic claudication: Secondary | ICD-10-CM | POA: Diagnosis not present

## 2023-05-31 ENCOUNTER — Other Ambulatory Visit (HOSPITAL_COMMUNITY): Payer: Self-pay | Admitting: Internal Medicine

## 2023-06-01 ENCOUNTER — Ambulatory Visit: Payer: BC Managed Care – PPO | Admitting: Physical Therapy

## 2023-06-01 ENCOUNTER — Encounter: Payer: Self-pay | Admitting: *Deleted

## 2023-06-01 ENCOUNTER — Inpatient Hospital Stay: Payer: BC Managed Care – PPO | Attending: Adult Health | Admitting: *Deleted

## 2023-06-01 DIAGNOSIS — C61 Malignant neoplasm of prostate: Secondary | ICD-10-CM

## 2023-06-01 NOTE — Progress Notes (Addendum)
SCP reviewed and completed. Most recent PSA results are <0.015 on 03/25/2023. Next PSA labs will be 12/01/2023 and pt will see Dr. Laverle Patter on 12/08/2023.

## 2023-06-04 ENCOUNTER — Ambulatory Visit: Payer: BC Managed Care – PPO | Admitting: Internal Medicine

## 2023-06-04 ENCOUNTER — Encounter: Payer: Self-pay | Admitting: Internal Medicine

## 2023-06-04 VITALS — BP 134/70 | HR 61 | Resp 16 | Ht 69.0 in | Wt 231.6 lb

## 2023-06-04 DIAGNOSIS — I5042 Chronic combined systolic (congestive) and diastolic (congestive) heart failure: Secondary | ICD-10-CM | POA: Diagnosis not present

## 2023-06-04 DIAGNOSIS — E559 Vitamin D deficiency, unspecified: Secondary | ICD-10-CM | POA: Diagnosis not present

## 2023-06-04 DIAGNOSIS — Z23 Encounter for immunization: Secondary | ICD-10-CM

## 2023-06-04 DIAGNOSIS — K219 Gastro-esophageal reflux disease without esophagitis: Secondary | ICD-10-CM | POA: Diagnosis not present

## 2023-06-04 DIAGNOSIS — E785 Hyperlipidemia, unspecified: Secondary | ICD-10-CM

## 2023-06-04 DIAGNOSIS — Z6834 Body mass index (BMI) 34.0-34.9, adult: Secondary | ICD-10-CM | POA: Diagnosis not present

## 2023-06-04 DIAGNOSIS — E119 Type 2 diabetes mellitus without complications: Secondary | ICD-10-CM

## 2023-06-04 DIAGNOSIS — Z1159 Encounter for screening for other viral diseases: Secondary | ICD-10-CM

## 2023-06-04 DIAGNOSIS — I10 Essential (primary) hypertension: Secondary | ICD-10-CM

## 2023-06-04 DIAGNOSIS — E1169 Type 2 diabetes mellitus with other specified complication: Secondary | ICD-10-CM | POA: Diagnosis not present

## 2023-06-04 DIAGNOSIS — Z7984 Long term (current) use of oral hypoglycemic drugs: Secondary | ICD-10-CM

## 2023-06-04 DIAGNOSIS — Z1329 Encounter for screening for other suspected endocrine disorder: Secondary | ICD-10-CM

## 2023-06-04 DIAGNOSIS — D649 Anemia, unspecified: Secondary | ICD-10-CM

## 2023-06-04 NOTE — Patient Instructions (Signed)
It was a pleasure to see you today.  Thank you for giving [place] the opportunity to be involved in your care.  Below is a brief recap of your visit and next steps.  We will plan to see you again in 6 months.  Summary No medication changes today Repeat labs ordered Flu shot today Follow up in 6 months

## 2023-06-04 NOTE — Assessment & Plan Note (Signed)
 Remains adequately controlled on current antihypertensive regimen.  No medication changes are indicated today.

## 2023-06-04 NOTE — Assessment & Plan Note (Signed)
Repeat CBC ordered today.  GI follow-up is scheduled for next month.

## 2023-06-04 NOTE — Assessment & Plan Note (Signed)
Currently prescribed atorvastatin 80 mg daily.  Repeat lipid panel ordered today.

## 2023-06-04 NOTE — Assessment & Plan Note (Signed)
Remains euvolemic on exam today.  No medication changes are indicated.

## 2023-06-04 NOTE — Assessment & Plan Note (Signed)
Currently prescribed metformin 1000 mg twice daily and Farxiga 10 mg daily.  Repeat A1c and urine microalbumin/creatinine ratio ordered today.

## 2023-06-04 NOTE — Assessment & Plan Note (Signed)
Influenza vaccine administered today.

## 2023-06-04 NOTE — Progress Notes (Signed)
Established Patient Office Visit  Subjective   Patient ID: Andrew Arnold, male    DOB: 1956/02/19  Age: 67 y.o. MRN: [id number]  Chief Complaint  Patient presents with   Hypertension    Follow up visit    Andrew Arnold returns to care today for routine follow-up.  He was last evaluated by me on 8/22 as a new patient presenting to establish care.  No medication changes were made at that time and 42-month follow-up was arranged.  In the interim, he has attended physical therapy.  There have otherwise been no acute interval events. Andrew Arnold reports feeling well today. He is asymptomatic and has no acute concerns to discuss.   Past Medical History:  Diagnosis Date   Allergic rhinitis due to pollen    on allergy shots   Anemia    Arrhythmia    1980s hospitalized for HTN and ?irregular rhythm but no specific dx but resolved during hospitalization   Coronary artery disease    Diabetes mellitus without complication (HCC)    HTN (hypertension)    Hyperlipidemia    Nausea and vomiting 08/01/2021   OSA (obstructive sleep apnea)    mild obstructive sleep apnea with an AHI of 10.8/h with oxygen saturations as low as 85%.   prostate ca 07/2022   Past Surgical History:  Procedure Laterality Date   ANTERIOR CERVICAL DECOMP/DISCECTOMY FUSION N/A 06/03/2021   Procedure: Anterior Cervical Decompression Fusion  Cervical three-four;  Surgeon: Bedelia Person, MD;  Location: [facility] OR;  Service: Neurosurgery;  Laterality: N/A;   colonoscopy  07/14/2003   Dr. Jena Gauss: normal rectum, sigmoid diverticula in colonic mucosa   COLONOSCOPY N/A 02/15/2014   Procedure: COLONOSCOPY;  Surgeon: Corbin Ade, MD;  Location: [facility];  Service: Endoscopy;  Laterality: N/A;  2:00   COLONOSCOPY WITH PROPOFOL N/A 05/08/2022   Procedure: COLONOSCOPY WITH PROPOFOL;  Surgeon: Corbin Ade, MD;  Location: [facility];  Service: Endoscopy;  Laterality: N/A;  11:00 am   LYMPHADENECTOMY Bilateral 12/17/2022    Procedure: BILATERAL PELVIC LYMPHADENECTOMY;  Surgeon: Heloise Purpura, MD;  Location: [facility];  Service: Urology;  Laterality: Bilateral;   PROSTATE BIOPSY     RIGHT/LEFT HEART CATH AND CORONARY ANGIOGRAPHY N/A 05/23/2021   Procedure: RIGHT/LEFT HEART CATH AND CORONARY ANGIOGRAPHY;  Surgeon: Dolores Patty, MD;  Location: [facility];  Service: Cardiovascular;  Laterality: N/A;   ROBOT ASSISTED LAPAROSCOPIC RADICAL PROSTATECTOMY N/A 12/17/2022   Procedure: XI ROBOTIC ASSISTED LAPAROSCOPIC RADICAL PROSTATECTOMY LEVEL 2;  Surgeon: Heloise Purpura, MD;  Location: [facility];  Service: Urology;  Laterality: N/A;  210 MINUTES NEEDED FOR CASE   Social History   Tobacco Use   Smoking status: Never   Smokeless tobacco: Never  Vaping Use   Vaping status: Never Used  Substance Use Topics   Alcohol use: No   Drug use: No   Family History  Problem Relation Age of Onset   Breast cancer Mother 40   Cirrhosis Father        cirrhosis of the liver; alcohol related   Diabetes Sister    Other Other        neice was on allergy vaccine when young   Colon cancer Neg Hx    Allergies  Allergen Reactions   Penicillins     "I almost forgot I was allergic to it" cannot define what allergy is   Review of Systems  Constitutional:  Negative for chills and fever.  HENT:  Negative for sore throat.   Respiratory:  Negative for cough and shortness of breath.   Cardiovascular:  Negative for chest pain, palpitations and leg swelling.  Gastrointestinal:  Negative for abdominal pain, blood in stool, constipation, diarrhea, nausea and vomiting.  Genitourinary:  Negative for dysuria and hematuria.  Musculoskeletal:  Negative for myalgias.  Skin:  Negative for itching and rash.  Neurological:  Negative for dizziness and headaches.  Psychiatric/Behavioral:  Negative for depression and suicidal ideas.      Objective:     BP 134/70   Pulse 61   Resp 16   Ht 5\' 9"  (1.753 m)   Wt 231 lb 9.6 oz (105.1  kg)   SpO2 98%   BMI 34.20 kg/m  BP Readings from Last 3 Encounters:  06/04/23 134/70  03/04/23 128/68  02/23/23 120/65   Physical Exam Vitals reviewed.  Constitutional:      General: He is not in acute distress.    Appearance: Normal appearance. He is obese. He is not ill-appearing.  HENT:     Head: Normocephalic and atraumatic.     Right Ear: External ear normal.     Left Ear: External ear normal.     Nose: Nose normal. No congestion or rhinorrhea.     Mouth/Throat:     Mouth: Mucous membranes are moist.     Pharynx: Oropharynx is clear.  Eyes:     General: No scleral icterus.    Extraocular Movements: Extraocular movements intact.     Conjunctiva/sclera: Conjunctivae normal.     Pupils: Pupils are equal, round, and reactive to light.  Cardiovascular:     Rate and Rhythm: Normal rate and regular rhythm.     Pulses: Normal pulses.     Heart sounds: Murmur heard.  Pulmonary:     Effort: Pulmonary effort is normal.     Breath sounds: Normal breath sounds. No wheezing, rhonchi or rales.  Abdominal:     General: Abdomen is flat. Bowel sounds are normal. There is no distension.     Palpations: Abdomen is soft.     Tenderness: There is no abdominal tenderness.  Musculoskeletal:        General: No swelling or deformity. Normal range of motion.     Cervical back: Normal range of motion.  Skin:    General: Skin is warm and dry.     Capillary Refill: Capillary refill takes less than 2 seconds.  Neurological:     General: No focal deficit present.     Mental Status: He is alert and oriented to person, place, and time.     Motor: No weakness.  Psychiatric:        Mood and Affect: Mood normal.        Behavior: Behavior normal.        Thought Content: Thought content normal.   Last CBC Lab Results  Component Value Date   WBC 3.7 (L) 03/23/2023   HGB 12.0 (L) 03/23/2023   HCT 37.4 (L) 03/23/2023   MCV 96.1 03/23/2023   MCH 30.8 03/23/2023   RDW 14.0 03/23/2023   PLT  230 03/23/2023   Last metabolic panel Lab Results  Component Value Date   GLUCOSE 101 (H) 12/08/2022   NA 141 12/08/2022   K 4.5 12/08/2022   CL 110 12/08/2022   CO2 21 (L) 12/08/2022   BUN 28 (H) 12/08/2022   CREATININE 1.24 12/08/2022   GFRNONAA >60 12/08/2022   CALCIUM 9.3 12/08/2022   PHOS 3.5 05/15/2021  PROT 6.2 (L) 08/05/2021   ALBUMIN 3.0 (L) 08/05/2021   BILITOT 0.3 08/05/2021   ALKPHOS 92 08/05/2021   AST 14 (L) 08/05/2021   ALT 10 08/05/2021   ANIONGAP 10 12/08/2022   Last hemoglobin A1c Lab Results  Component Value Date   HGBA1C 6.0 (H) 12/08/2022   Last thyroid functions Lab Results  Component Value Date   TSH 0.333 (L) 08/29/2017   Last vitamin B12 and Folate Lab Results  Component Value Date   VITAMINB12 768 07/14/2022   FOLATE >24.0 07/14/2022     Assessment & Plan:   Problem List Items Addressed This Visit       Essential hypertension - Primary (Chronic)    Remains adequately controlled on current antihypertensive regimen.  No medication changes are indicated today.      CHF (congestive heart failure) (HCC)    Remains euvolemic on exam today.  No medication changes are indicated.      DM2 (diabetes mellitus, type 2) (HCC) (Chronic)    Currently prescribed metformin 1000 mg twice daily and Farxiga 10 mg daily.  Repeat A1c and urine microalbumin/creatinine ratio ordered today.      Hyperlipidemia associated with type 2 diabetes mellitus (HCC)    Currently prescribed atorvastatin 80 mg daily.  Repeat lipid panel ordered today.      Normocytic anemia    Repeat CBC ordered today.  GI follow-up is scheduled for next month.      Need for influenza vaccination    Influenza vaccine administered today      Return in about 6 months (around 12/02/2023).   Billie Lade, MD

## 2023-06-06 ENCOUNTER — Other Ambulatory Visit: Payer: Self-pay | Admitting: Internal Medicine

## 2023-06-06 DIAGNOSIS — E1169 Type 2 diabetes mellitus with other specified complication: Secondary | ICD-10-CM

## 2023-06-06 DIAGNOSIS — E559 Vitamin D deficiency, unspecified: Secondary | ICD-10-CM

## 2023-06-06 MED ORDER — VITAMIN D (ERGOCALCIFEROL) 1.25 MG (50000 UNIT) PO CAPS: 50000.0000 [IU] | ORAL_CAPSULE | ORAL | 0 refills | Status: DC

## 2023-06-06 MED ORDER — EZETIMIBE 10 MG PO TABS: 10.0000 mg | ORAL_TABLET | Freq: Every day | ORAL | 3 refills | Status: DC

## 2023-06-07 ENCOUNTER — Other Ambulatory Visit: Payer: Self-pay

## 2023-06-07 DIAGNOSIS — D649 Anemia, unspecified: Secondary | ICD-10-CM

## 2023-06-07 LAB — HCV AB W REFLEX TO QUANT PCR: HCV Ab: NONREACTIVE

## 2023-06-07 LAB — CMP14+EGFR
ALT: 15 [IU]/L (ref 0–44)
AST: 15 [IU]/L (ref 0–40)
Albumin: 4.5 g/dL (ref 3.9–4.9)
Alkaline Phosphatase: 99 IU/L (ref 44–121)
BUN/Creatinine Ratio: 15 (ref 10–24)
BUN: 15 mg/dL (ref 8–27)
Bilirubin Total: 0.5 mg/dL (ref 0.0–1.2)
CO2: 24 mmol/L (ref 20–29)
Calcium: 9.6 mg/dL (ref 8.6–10.2)
Chloride: 103 mmol/L (ref 96–106)
Creatinine, Ser: 1.03 mg/dL (ref 0.76–1.27)
Globulin, Total: 2.6 g/dL (ref 1.5–4.5)
Glucose: 91 mg/dL (ref 70–99)
Potassium: 4.9 mmol/L (ref 3.5–5.2)
Sodium: 139 mmol/L (ref 134–144)
Total Protein: 7.1 g/dL (ref 6.0–8.5)
eGFR: 79 mL/min/{1.73_m2} (ref >59)

## 2023-06-07 LAB — CBC WITH DIFFERENTIAL/PLATELET
Basophils Absolute: 0 10*3/uL (ref 0.0–0.2)
Basos: 0 %
EOS (ABSOLUTE): 0.1 10*3/uL (ref 0.0–0.4)
Eos: 2 %
Hematocrit: 39.8 % (ref 37.5–51.0)
Hemoglobin: 13.1 g/dL (ref 13.0–17.7)
Immature Grans (Abs): 0 10*3/uL (ref 0.0–0.1)
Immature Granulocytes: 0 %
Lymphocytes Absolute: 1.1 10*3/uL (ref 0.7–3.1)
Lymphs: 36 %
MCH: 31 pg (ref 26.6–33.0)
MCHC: 32.9 g/dL (ref 31.5–35.7)
MCV: 94 fL (ref 79–97)
Monocytes Absolute: 0.3 10*3/uL (ref 0.1–0.9)
Monocytes: 10 %
Neutrophils Absolute: 1.6 10*3/uL (ref 1.4–7.0)
Neutrophils: 52 %
Platelets: 259 10*3/uL (ref 150–450)
RBC: 4.22 x10E6/uL (ref 4.14–5.80)
RDW: 14.1 % (ref 11.6–15.4)
WBC: 3.1 10*3/uL — ABNORMAL LOW (ref 3.4–10.8)

## 2023-06-07 LAB — HCV INTERPRETATION

## 2023-06-07 LAB — LIPID PANEL
Chol/HDL Ratio: 4.4 ratio (ref 0.0–5.0)
Cholesterol, Total: 155 mg/dL (ref 100–199)
HDL: 35 mg/dL — ABNORMAL LOW (ref >39)
LDL Chol Calc (NIH): 98 mg/dL (ref 0–99)
Triglycerides: 121 mg/dL (ref 0–149)
VLDL Cholesterol Cal: 22 mg/dL (ref 5–40)

## 2023-06-07 LAB — TSH+FREE T4
Free T4: 1.69 ng/dL (ref 0.82–1.77)
TSH: 0.971 u[IU]/mL (ref 0.450–4.500)

## 2023-06-07 LAB — B12 AND FOLATE PANEL
Folate: 7.1 ng/mL (ref >3.0)
Vitamin B-12: 608 pg/mL (ref 232–1245)

## 2023-06-07 LAB — MICROALBUMIN / CREATININE URINE RATIO
Creatinine, Urine: 66.4 mg/dL
Microalb/Creat Ratio: 279 mg/g{creat} — ABNORMAL HIGH (ref 0–29)
Microalbumin, Urine: 185.1 ug/mL

## 2023-06-07 LAB — HEMOGLOBIN A1C
Est. average glucose Bld gHb Est-mCnc: 131 mg/dL
Hgb A1c MFr Bld: 6.2 % — ABNORMAL HIGH (ref 4.8–5.6)

## 2023-06-07 LAB — VITAMIN D 25 HYDROXY (VIT D DEFICIENCY, FRACTURES): Vit D, 25-Hydroxy: 19.9 ng/mL — ABNORMAL LOW (ref 30.0–100.0)

## 2023-06-08 ENCOUNTER — Ambulatory Visit: Payer: BC Managed Care – PPO

## 2023-06-08 DIAGNOSIS — M5459 Other low back pain: Secondary | ICD-10-CM | POA: Diagnosis not present

## 2023-06-08 DIAGNOSIS — R262 Difficulty in walking, not elsewhere classified: Secondary | ICD-10-CM | POA: Diagnosis not present

## 2023-06-08 DIAGNOSIS — M6281 Muscle weakness (generalized): Secondary | ICD-10-CM

## 2023-06-08 DIAGNOSIS — R2689 Other abnormalities of gait and mobility: Secondary | ICD-10-CM

## 2023-06-08 NOTE — Therapy (Signed)
OUTPATIENT PHYSICAL THERAPY TREATMENT   Patient Name: Andrew Arnold MRN: [id number] DOB:1955/08/12, 67 y.o., male Today's Date: 06/08/2023   END OF SESSION:  PT End of Session - 06/08/23 1607     Visit Number 7    Number of Visits 9    Date for PT Re-Evaluation 05/31/23    Authorization Type BC[employer]    PT Start Time 1615    PT Stop Time 1658    PT Time Calculation (min) 43 min    Activity Tolerance Patient tolerated treatment well    Behavior During Therapy WFL for tasks assessed/performed                 Past Medical History:  Diagnosis Date   Allergic rhinitis due to pollen    on allergy shots   Anemia    Arrhythmia    1980s hospitalized for HTN and ?irregular rhythm but no specific dx but resolved during hospitalization   Coronary artery disease    Diabetes mellitus without complication (HCC)    HTN (hypertension)    Hyperlipidemia    Nausea and vomiting 08/01/2021   OSA (obstructive sleep apnea)    mild obstructive sleep apnea with an AHI of 10.8/h with oxygen saturations as low as 85%.   prostate ca 07/2022   Past Surgical History:  Procedure Laterality Date   ANTERIOR CERVICAL DECOMP/DISCECTOMY FUSION N/A 06/03/2021   Procedure: Anterior Cervical Decompression Fusion  Cervical three-four;  Surgeon: Bedelia Person, MD;  Location: [facility] OR;  Service: Neurosurgery;  Laterality: N/A;   colonoscopy  07/14/2003   Dr. Jena Gauss: normal rectum, sigmoid diverticula in colonic mucosa   COLONOSCOPY N/A 02/15/2014   Procedure: COLONOSCOPY;  Surgeon: Corbin Ade, MD;  Location: [facility];  Service: Endoscopy;  Laterality: N/A;  2:00   COLONOSCOPY WITH PROPOFOL N/A 05/08/2022   Procedure: COLONOSCOPY WITH PROPOFOL;  Surgeon: Corbin Ade, MD;  Location: [facility];  Service: Endoscopy;  Laterality: N/A;  11:00 am   LYMPHADENECTOMY Bilateral 12/17/2022   Procedure: BILATERAL PELVIC LYMPHADENECTOMY;  Surgeon: Heloise Purpura, MD;  Location: [facility];  Service:  Urology;  Laterality: Bilateral;   PROSTATE BIOPSY     RIGHT/LEFT HEART CATH AND CORONARY ANGIOGRAPHY N/A 05/23/2021   Procedure: RIGHT/LEFT HEART CATH AND CORONARY ANGIOGRAPHY;  Surgeon: Dolores Patty, MD;  Location: [facility];  Service: Cardiovascular;  Laterality: N/A;   ROBOT ASSISTED LAPAROSCOPIC RADICAL PROSTATECTOMY N/A 12/17/2022   Procedure: XI ROBOTIC ASSISTED LAPAROSCOPIC RADICAL PROSTATECTOMY LEVEL 2;  Surgeon: Heloise Purpura, MD;  Location: [facility];  Service: Urology;  Laterality: N/A;  210 MINUTES NEEDED FOR CASE   Patient Active Problem List   Diagnosis Date Noted   Need for influenza vaccination 06/04/2023   CHF (congestive heart failure) (HCC) 03/04/2023   Hyperlipidemia associated with type 2 diabetes mellitus (HCC) 03/04/2023   Lumbar stenosis 03/04/2023   Prostate cancer (HCC) 12/17/2022   Malignant neoplasm of prostate (HCC) 10/22/2022   OSA (obstructive sleep apnea) 04/23/2022   H/O adenomatous polyp of colon 03/27/2022   Normocytic anemia 03/27/2022   Gait abnormality 03/10/2022   Chronic low back pain 03/10/2022   Paresthesia 03/10/2022   CAP (community acquired pneumonia) 08/01/2021   Nausea and vomiting 08/01/2021   DM2 (diabetes mellitus, type 2) (HCC) 08/01/2021   Coronary artery disease involving native coronary artery of native heart without angina pectoris 07/31/2021   Stenosis of cervical spine with myelopathy (HCC) 06/03/2021   Macrocytic anemia 05/15/2021  Bilateral lower extremity edema 05/15/2021   BPH (benign prostatic hyperplasia) 05/15/2021   Elevated PSA 05/15/2021   Cervical spinal stenosis 05/15/2021   DDD (degenerative disc disease), cervical 05/15/2021   Syncope 05/14/2021   Elevated hemoglobin A1c 04/10/2021   GERD (gastroesophageal reflux disease) 08/29/2017   Encounter for screening colonoscopy 02/01/2014   Essential hypertension 08/14/2008   Seasonal and perennial allergic rhinitis 04/23/2007    PCP: Billie Lade, MD  REFERRING PROVIDER: Janeece Riggers, FNP  REFERRING DIAG: [phone] (ICD-10-CM) - Spinal stenosis, lumbar region without neurogenic claudication   Rationale for Evaluation and Treatment: Rehabilitation  THERAPY DIAG:  Other low back pain  Muscle weakness (generalized)  Other abnormalities of gait and mobility  ONSET DATE: Chronic   SUBJECTIVE:                                                                                                                                                                                          SUBJECTIVE STATEMENT: Pt presents to PT with reports of continued decrease in lower back pain. Has been compliant with HEP.   PERTINENT HISTORY:  DM II, HTN, Cancer, CHF, ACDF 2022  PAIN:  Are you having pain?  Yes: NPRS scale: 2-3/10 Worst: 2/10 Pain location: lower back Pain description: sharp Aggravating factors: prolonged sitting Relieving factors: medication, movement  PRECAUTIONS: None  PATIENT GOALS: decrease back pain, start a good exercise program   OBJECTIVE:  PATIENT SURVEYS:  FOTO: 67% function; 72% predicted  05/25/2023: 72%   SENSATION: Light touch: Impaired - lateral L thigh  MUSCLE LENGTH: Hamstrings: Right DNT; Left DNT Maisie Fus test: Right (+); Left (+)  POSTURE: rounded shoulders, forward head, and increased lumbar lordosis  PALPATION: TTP to R gluteals  LUMBAR ROM:   AROM eval  Flexion WFL  Extension WFL  Right lateral flexion   Left lateral flexion   Right rotation 50% reduced  Left rotation WFL   (Blank rows = not tested)  LOWER EXTREMITY MMT:    MMT Right eval Left eval  Hip flexion 3+/5 4/5  Hip extension    Hip abduction 3+/5 3+/5  Hip adduction    Hip internal rotation    Hip external rotation    Knee flexion 4/5 5/5  Knee extension 4/5 5/5  Ankle dorsiflexion    Ankle plantarflexion    Ankle inversion    Ankle eversion     (Blank rows = not tested)  LUMBAR SPECIAL TESTS:   Straight leg raise test: Negative and Slump test: Negative  FUNCTIONAL TESTS:  30 Second Sit to Stand: 8 reps  GAIT: Distance walked: 24ft Assistive device utilized:  Single point cane Level of assistance: Modified independence Comments: antalgic gait to R   TREATMENT: [facility] Adult PT Treatment:                                                DATE: 06/08/2023 Therapeutic Exercise: NuStep L6 x 5 min with UE/LE while taking subjective Modified thomas stretch x 60" each Supine clamshell 2x20 black band Bridge with black band 2x10 Supine pilates SLR 2x15 each Seated knee ext 2x10 25# Seated knee flexion 2x10 50# Standing hip abd 2x10 25#  [facility] Adult PT Treatment:                                                DATE: 05/25/2023 Therapeutic Exercise: NuStep L6 x 6 min with UE/LE while taking subjective Piriformis stretch 2 x 20 sec each LT 4 x 5 sec each Supine pilates SLR 2 x 15 each Bridge 2 x 10 Hooklying unilateral clamshell with black 3 x 10 each Supine 90/90 table top hold 5 x 10 sec Modified crunch with physioball 2 x 10 with 3 sec hold  [facility] Adult PT Treatment:                                                DATE: 05/06/2023 Therapeutic Exercise: NuStep L6 x 5 min with UE/LE while taking subjective Supine clamshell x 20 black band Bridge with black band 2x10 Supine pilates SLR 2x15 each Supine 90/90 table top 2x25" Modified crunch with physioball 2x10 - 3" hold Seated knee ext 2x10 20# Seated knee flexion 2x10 50# Lateral walk RTB x 3 laps at counter Standing hip abd/ext 2x10 RTB Pallof press 2x10 10#  PATIENT EDUCATION:  Education details: HEP Person educated: Patient Education method: Programmer, multimedia, Demonstration Education comprehension: verbalized understanding and returned demonstration  HOME EXERCISE PROGRAM: Access Code: [email or web address]   ASSESSMENT: CLINICAL IMPRESSION: Pt was able to complete prescribed exercises with no adverse effect. Therapy once again  focused on core and hip strengthening with increased intensity. Will continue to progress as able per POC.   OBJECTIVE IMPAIRMENTS: Abnormal gait, decreased activity tolerance, decreased endurance, decreased mobility, difficulty walking, decreased ROM, decreased strength, and pain.   ACTIVITY LIMITATIONS: lifting, bending, sitting, standing, squatting, sleeping, stairs, transfers, and locomotion level  PARTICIPATION LIMITATIONS: driving, shopping, community activity, occupation, and yard work  PERSONAL FACTORS: 3+ comorbidities: DM II, HTN, Cancer, CHF, ACDF 2022  are also affecting patient's functional outcome.    GOALS: Goals reviewed with patient? No  SHORT TERM GOALS: Target date: 04/26/2023   Pt will be compliant and knowledgeable with initial HEP for improved comfort and carryover Baseline: initial HEP given  Goal status: INITIAL  2.  Pt will self report lower back pain no greater than 1/10 for improved comfort and functional ability Baseline: 2/10 at worst Goal status: INITIAL   LONG TERM GOALS: Target date: 05/31/2023   Pt will improve FOTO function score to no less than 72% as proxy for functional improvement Baseline: 67% function 05/25/2023: 72% Goal status: MET  2.  Pt will self report lower back pain  no greater than 0/10 for improved comfort and functional ability Baseline: 2/10 at worst Goal status: INITIAL   3.  Pt will increase 30 Second Sit to Stand rep count to no less than 10 reps for improved balance, strength, and functional mobility Baseline: 8 reps  Goal status: INITIAL   4.  Pt will improve all LE MMT to no less than 5/5 for improved comfort and functional mobility with decreased back pain Baseline: see MMT chart Goal status: INITIAL   PLAN: PT FREQUENCY: 1x/week  PT DURATION: 8 weeks  PLANNED INTERVENTIONS: Therapeutic exercises, Therapeutic activity, Neuromuscular re-education, Balance training, Gait training, Patient/Family education, Self  Care, Joint mobilization, Aquatic Therapy, Dry Needling, Electrical stimulation, Cryotherapy, Moist heat, Manual therapy, and Re-evaluation.  PLAN FOR NEXT SESSION: assess HEP response, core/hip strengthening, hip flexor stretching   Eloy End PT  06/08/23 4:58 PM

## 2023-06-15 ENCOUNTER — Ambulatory Visit: Payer: BC Managed Care – PPO | Attending: Nurse Practitioner

## 2023-06-15 DIAGNOSIS — M6281 Muscle weakness (generalized): Secondary | ICD-10-CM | POA: Diagnosis not present

## 2023-06-15 DIAGNOSIS — R2689 Other abnormalities of gait and mobility: Secondary | ICD-10-CM | POA: Diagnosis not present

## 2023-06-15 DIAGNOSIS — M5459 Other low back pain: Secondary | ICD-10-CM | POA: Insufficient documentation

## 2023-06-15 NOTE — Therapy (Unsigned)
OUTPATIENT PHYSICAL THERAPY TREATMENT   Patient Name: Andrew Arnold MRN: [id number] DOB:23-Dec-1955, 67 y.o., male Today's Date: 06/16/2023   END OF SESSION:  PT End of Session - 06/15/23 1615     Visit Number 8    Number of Visits 14    Date for PT Re-Evaluation 07/28/23    Authorization Type [employer]    PT Start Time 1615    PT Stop Time 1700    PT Time Calculation (min) 45 min    Activity Tolerance Patient tolerated treatment well    Behavior During Therapy WFL for tasks assessed/performed                 Past Medical History:  Diagnosis Date   Allergic rhinitis due to pollen    on allergy shots   Anemia    Arrhythmia    1980s hospitalized for HTN and ?irregular rhythm but no specific dx but resolved during hospitalization   Coronary artery disease    Diabetes mellitus without complication (HCC)    HTN (hypertension)    Hyperlipidemia    Nausea and vomiting 08/01/2021   OSA (obstructive sleep apnea)    mild obstructive sleep apnea with an AHI of 10.8/h with oxygen saturations as low as 85%.   prostate ca 07/2022   Past Surgical History:  Procedure Laterality Date   ANTERIOR CERVICAL DECOMP/DISCECTOMY FUSION N/A 06/03/2021   Procedure: Anterior Cervical Decompression Fusion  Cervical three-four;  Surgeon: Bedelia Person, MD;  Location: [facility] OR;  Service: Neurosurgery;  Laterality: N/A;   colonoscopy  07/14/2003   Dr. Jena Gauss: normal rectum, sigmoid diverticula in colonic mucosa   COLONOSCOPY N/A 02/15/2014   Procedure: COLONOSCOPY;  Surgeon: Corbin Ade, MD;  Location: [facility];  Service: Endoscopy;  Laterality: N/A;  2:00   COLONOSCOPY WITH PROPOFOL N/A 05/08/2022   Procedure: COLONOSCOPY WITH PROPOFOL;  Surgeon: Corbin Ade, MD;  Location: [facility];  Service: Endoscopy;  Laterality: N/A;  11:00 am   LYMPHADENECTOMY Bilateral 12/17/2022   Procedure: BILATERAL PELVIC LYMPHADENECTOMY;  Surgeon: Heloise Purpura, MD;  Location: [facility];  Service:  Urology;  Laterality: Bilateral;   PROSTATE BIOPSY     RIGHT/LEFT HEART CATH AND CORONARY ANGIOGRAPHY N/A 05/23/2021   Procedure: RIGHT/LEFT HEART CATH AND CORONARY ANGIOGRAPHY;  Surgeon: Dolores Patty, MD;  Location: [facility];  Service: Cardiovascular;  Laterality: N/A;   ROBOT ASSISTED LAPAROSCOPIC RADICAL PROSTATECTOMY N/A 12/17/2022   Procedure: XI ROBOTIC ASSISTED LAPAROSCOPIC RADICAL PROSTATECTOMY LEVEL 2;  Surgeon: Heloise Purpura, MD;  Location: [facility];  Service: Urology;  Laterality: N/A;  210 MINUTES NEEDED FOR CASE   Patient Active Problem List   Diagnosis Date Noted   Need for influenza vaccination 06/04/2023   CHF (congestive heart failure) (HCC) 03/04/2023   Hyperlipidemia associated with type 2 diabetes mellitus (HCC) 03/04/2023   Lumbar stenosis 03/04/2023   Prostate cancer (HCC) 12/17/2022   Malignant neoplasm of prostate (HCC) 10/22/2022   OSA (obstructive sleep apnea) 04/23/2022   H/O adenomatous polyp of colon 03/27/2022   Normocytic anemia 03/27/2022   Gait abnormality 03/10/2022   Chronic low back pain 03/10/2022   Paresthesia 03/10/2022   CAP (community acquired pneumonia) 08/01/2021   Nausea and vomiting 08/01/2021   DM2 (diabetes mellitus, type 2) (HCC) 08/01/2021   Coronary artery disease involving native coronary artery of native heart without angina pectoris 07/31/2021   Stenosis of cervical spine with myelopathy (HCC) 06/03/2021   Macrocytic anemia 05/15/2021  Bilateral lower extremity edema 05/15/2021   BPH (benign prostatic hyperplasia) 05/15/2021   Elevated PSA 05/15/2021   Cervical spinal stenosis 05/15/2021   DDD (degenerative disc disease), cervical 05/15/2021   Syncope 05/14/2021   Elevated hemoglobin A1c 04/10/2021   GERD (gastroesophageal reflux disease) 08/29/2017   Encounter for screening colonoscopy 02/01/2014   Essential hypertension 08/14/2008   Seasonal and perennial allergic rhinitis 04/23/2007    PCP: Billie Lade, MD  REFERRING PROVIDER: Janeece Riggers, FNP  REFERRING DIAG: [phone] (ICD-10-CM) - Spinal stenosis, lumbar region without neurogenic claudication   Rationale for Evaluation and Treatment: Rehabilitation  THERAPY DIAG:  Other low back pain  Muscle weakness (generalized)  Other abnormalities of gait and mobility  ONSET DATE: Chronic   SUBJECTIVE:                                                                                                                                                                                          SUBJECTIVE STATEMENT: Pt presents to PT with reports of continued decrease in lower back pain. Has been compliant with HEP.   PERTINENT HISTORY:  DM II, HTN, Cancer, CHF, ACDF 2022  PAIN:  Are you having pain?  Yes: NPRS scale: 2-3/10 Worst: 2/10 Pain location: lower back Pain description: sharp Aggravating factors: prolonged sitting Relieving factors: medication, movement  PRECAUTIONS: None  PATIENT GOALS: decrease back pain, start a good exercise program   OBJECTIVE:  PATIENT SURVEYS:  FOTO: 67% function; 72% predicted  05/25/2023: 72%   SENSATION: Light touch: Impaired - lateral L thigh  MUSCLE LENGTH: Hamstrings: Right DNT; Left DNT Maisie Fus test: Right (+); Left (+)  POSTURE: rounded shoulders, forward head, and increased lumbar lordosis  PALPATION: TTP to R gluteals  LUMBAR ROM:   AROM eval  Flexion [facility]  Extension WFL  Right lateral flexion   Left lateral flexion   Right rotation 50% reduced  Left rotation WFL   (Blank rows = not tested)  LOWER EXTREMITY MMT:    MMT Right eval Left eval Right 06/15/23 Left 06/15/23  Hip flexion 3+/5 4/5 4/5 485  Hip extension      Hip abduction 3+/5 3+/5 4/5 4/5  Hip adduction      Hip internal rotation      Hip external rotation      Knee flexion 4/5 5/5 4/5   Knee extension 4/5 5/5 4/5   Ankle dorsiflexion      Ankle plantarflexion      Ankle inversion      Ankle  eversion       (Blank rows = not tested)  LUMBAR SPECIAL TESTS:  Straight leg raise test: Negative and Slump test: Negative  FUNCTIONAL TESTS:  30 Second Sit to Stand: 8 reps 06/16/2023: 9 reps  GAIT: Distance walked: 66ft Assistive device utilized: Single point cane Level of assistance: Modified independence Comments: antalgic gait to R   TREATMENT: [facility] Adult PT Treatment:                                                DATE: 06/15/2023 Therapeutic Exercise: NuStep L6 x 5 min with UE/LE while taking subjective Modified thomas stretch x 60" each Supine clamshell 2x20 black band Bridge with black band 2x10 Supine pilates SLR 2x15 each Seated knee ext 2x10 35# Single leg knee ext 2x10 15# R Seated knee flexion 2x10 50# Standing hip abd/ext 2x10 25# Therapeutic Activity: Assessment of tests/measures, goals, and outcomes  [facility] Adult PT Treatment:                                                DATE: 06/08/2023 Therapeutic Exercise: NuStep L6 x 5 min with UE/LE while taking subjective Modified thomas stretch x 60" each Supine clamshell 2x20 black band Bridge with black band 2x10 Supine pilates SLR 2x15 each Seated knee ext 2x10 25# Seated knee flexion 2x10 50# Standing hip abd 2x10 25#  [facility] Adult PT Treatment:                                                DATE: 05/25/2023 Therapeutic Exercise: NuStep L6 x 6 min with UE/LE while taking subjective Piriformis stretch 2 x 20 sec each LT 4 x 5 sec each Supine pilates SLR 2 x 15 each Bridge 2 x 10 Hooklying unilateral clamshell with black 3 x 10 each Supine 90/90 table top hold 5 x 10 sec Modified crunch with physioball 2 x 10 with 3 sec hold  PATIENT EDUCATION:  Education details: HEP Person educated: Patient Education method: Programmer, multimedia, Demonstration Education comprehension: verbalized understanding and returned demonstration  HOME EXERCISE PROGRAM: Access Code: [email or web address]   ASSESSMENT: CLINICAL IMPRESSION: Pt  was able to complete prescribed exercises with no adverse effect. Therapy once again focused on core and hip strengthening with increased intensity. Over the course of PT treatment he has progressed well with therapy, showing improvement in FOTO score, decrease back pain, and improved LE strength and functional mobility. Pt still has strength and mobility deficits, particularly R LE. He would benefit from short PT extension, will continue to progress as able per POC.   OBJECTIVE IMPAIRMENTS: Abnormal gait, decreased activity tolerance, decreased endurance, decreased mobility, difficulty walking, decreased ROM, decreased strength, and pain.   ACTIVITY LIMITATIONS: lifting, bending, sitting, standing, squatting, sleeping, stairs, transfers, and locomotion level  PARTICIPATION LIMITATIONS: driving, shopping, community activity, occupation, and yard work  PERSONAL FACTORS: 3+ comorbidities: DM II, HTN, Cancer, CHF, ACDF 2022  are also affecting patient's functional outcome.    GOALS: Goals reviewed with patient? No  SHORT TERM GOALS: Target date: 04/26/2023   Pt will be compliant and knowledgeable with initial HEP for improved comfort and carryover Baseline: initial HEP given  Goal status: MET  2.  Pt will self report lower back pain no greater than 1/10 for improved comfort and functional ability Baseline: 2/10 at worst Goal status: MET   LONG TERM GOALS: Target date: 07/28/2023    Pt will improve FOTO function score to no less than 72% as proxy for functional improvement Baseline: 67% function 05/25/2023: 72% Goal status: MET  2.  Pt will self report lower back pain no greater than 0/10 for improved comfort and functional ability Baseline: 2/10 at worst 06/16/2023: 1/10 at worst Goal status: IN PROGRESS   3.  Pt will increase 30 Second Sit to Stand rep count to no less than 10 reps for improved balance, strength, and functional mobility Baseline: 8 reps  06/15/2023: 9 reps Goal  status: IN PROGRESS   4.  Pt will improve all LE MMT to no less than 5/5 for improved comfort and functional mobility with decreased back pain Baseline: see MMT chart Goal status: IN PROGRESS   PLAN: PT FREQUENCY: 1x/week  PT DURATION: 6 weeks  PLANNED INTERVENTIONS: Therapeutic exercises, Therapeutic activity, Neuromuscular re-education, Balance training, Gait training, Patient/Family education, Self Care, Joint mobilization, Aquatic Therapy, Dry Needling, Electrical stimulation, Cryotherapy, Moist heat, Manual therapy, and Re-evaluation.  PLAN FOR NEXT SESSION: assess HEP response, core/hip strengthening, hip flexor stretching   Eloy End PT  06/16/23 9:52 AM

## 2023-06-21 ENCOUNTER — Other Ambulatory Visit: Payer: Self-pay | Admitting: Thoracic Surgery (Cardiothoracic Vascular Surgery)

## 2023-06-21 DIAGNOSIS — I7121 Aneurysm of the ascending aorta, without rupture: Secondary | ICD-10-CM

## 2023-06-23 ENCOUNTER — Ambulatory Visit: Payer: BC Managed Care – PPO

## 2023-06-23 DIAGNOSIS — R2689 Other abnormalities of gait and mobility: Secondary | ICD-10-CM | POA: Diagnosis not present

## 2023-06-23 DIAGNOSIS — M5459 Other low back pain: Secondary | ICD-10-CM

## 2023-06-23 DIAGNOSIS — M6281 Muscle weakness (generalized): Secondary | ICD-10-CM | POA: Diagnosis not present

## 2023-06-23 NOTE — Therapy (Signed)
OUTPATIENT PHYSICAL THERAPY TREATMENT   Patient Name: Andrew Arnold MRN: [id number] DOB:25-Aug-1955, 67 y.o., male Today's Date: 06/24/2023   END OF SESSION:  PT End of Session - 06/23/23 1622     Visit Number 9    Number of Visits 14    Date for PT Re-Evaluation 07/28/23    Authorization Type BC[employer]    PT Start Time 1622   arrived late   PT Stop Time 1704    PT Time Calculation (min) 42 min    Activity Tolerance Patient tolerated treatment well    Behavior During Therapy WFL for tasks assessed/performed                  Past Medical History:  Diagnosis Date   Allergic rhinitis due to pollen    on allergy shots   Anemia    Arrhythmia    1980s hospitalized for HTN and ?irregular rhythm but no specific dx but resolved during hospitalization   Coronary artery disease    Diabetes mellitus without complication (HCC)    HTN (hypertension)    Hyperlipidemia    Nausea and vomiting 08/01/2021   OSA (obstructive sleep apnea)    mild obstructive sleep apnea with an AHI of 10.8/h with oxygen saturations as low as 85%.   prostate ca 07/2022   Past Surgical History:  Procedure Laterality Date   ANTERIOR CERVICAL DECOMP/DISCECTOMY FUSION N/A 06/03/2021   Procedure: Anterior Cervical Decompression Fusion  Cervical three-four;  Surgeon: Bedelia Person, MD;  Location: [facility] OR;  Service: Neurosurgery;  Laterality: N/A;   colonoscopy  07/14/2003   Dr. Jena Gauss: normal rectum, sigmoid diverticula in colonic mucosa   COLONOSCOPY N/A 02/15/2014   Procedure: COLONOSCOPY;  Surgeon: Corbin Ade, MD;  Location: [facility];  Service: Endoscopy;  Laterality: N/A;  2:00   COLONOSCOPY WITH PROPOFOL N/A 05/08/2022   Procedure: COLONOSCOPY WITH PROPOFOL;  Surgeon: Corbin Ade, MD;  Location: [facility];  Service: Endoscopy;  Laterality: N/A;  11:00 am   LYMPHADENECTOMY Bilateral 12/17/2022   Procedure: BILATERAL PELVIC LYMPHADENECTOMY;  Surgeon: Heloise Purpura, MD;  Location:  [facility] ORS;  Service: Urology;  Laterality: Bilateral;   PROSTATE BIOPSY     RIGHT/LEFT HEART CATH AND CORONARY ANGIOGRAPHY N/A 05/23/2021   Procedure: RIGHT/LEFT HEART CATH AND CORONARY ANGIOGRAPHY;  Surgeon: Dolores Patty, MD;  Location: [facility];  Service: Cardiovascular;  Laterality: N/A;   ROBOT ASSISTED LAPAROSCOPIC RADICAL PROSTATECTOMY N/A 12/17/2022   Procedure: XI ROBOTIC ASSISTED LAPAROSCOPIC RADICAL PROSTATECTOMY LEVEL 2;  Surgeon: Heloise Purpura, MD;  Location: [facility];  Service: Urology;  Laterality: N/A;  210 MINUTES NEEDED FOR CASE   Patient Active Problem List   Diagnosis Date Noted   Need for influenza vaccination 06/04/2023   CHF (congestive heart failure) (HCC) 03/04/2023   Hyperlipidemia associated with type 2 diabetes mellitus (HCC) 03/04/2023   Lumbar stenosis 03/04/2023   Prostate cancer (HCC) 12/17/2022   Malignant neoplasm of prostate (HCC) 10/22/2022   OSA (obstructive sleep apnea) 04/23/2022   H/O adenomatous polyp of colon 03/27/2022   Normocytic anemia 03/27/2022   Gait abnormality 03/10/2022   Chronic low back pain 03/10/2022   Paresthesia 03/10/2022   CAP (community acquired pneumonia) 08/01/2021   Nausea and vomiting 08/01/2021   DM2 (diabetes mellitus, type 2) (HCC) 08/01/2021   Coronary artery disease involving native coronary artery of native heart without angina pectoris 07/31/2021   Stenosis of cervical spine with myelopathy (HCC) 06/03/2021   Macrocytic  anemia 05/15/2021   Bilateral lower extremity edema 05/15/2021   BPH (benign prostatic hyperplasia) 05/15/2021   Elevated PSA 05/15/2021   Cervical spinal stenosis 05/15/2021   DDD (degenerative disc disease), cervical 05/15/2021   Syncope 05/14/2021   Elevated hemoglobin A1c 04/10/2021   GERD (gastroesophageal reflux disease) 08/29/2017   Encounter for screening colonoscopy 02/01/2014   Essential hypertension 08/14/2008   Seasonal and perennial allergic rhinitis 04/23/2007     PCP: Billie Lade, MD  REFERRING PROVIDER: Janeece Riggers, FNP  REFERRING DIAG: [phone] (ICD-10-CM) - Spinal stenosis, lumbar region without neurogenic claudication   Rationale for Evaluation and Treatment: Rehabilitation  THERAPY DIAG:  Other low back pain  Muscle weakness (generalized)  ONSET DATE: Chronic   SUBJECTIVE:                                                                                                                                                                                          SUBJECTIVE STATEMENT: Pt presents to PT with reports of decreased back pain. Has been compliant with HEP.   PERTINENT HISTORY:  DM II, HTN, Cancer, CHF, ACDF 2022  PAIN:  Are you having pain?  Yes: NPRS scale: 2-3/10 Worst: 2/10 Pain location: lower back Pain description: sharp Aggravating factors: prolonged sitting Relieving factors: medication, movement  PRECAUTIONS: None  PATIENT GOALS: decrease back pain, start a good exercise program   OBJECTIVE:  PATIENT SURVEYS:  FOTO: 67% function; 72% predicted  05/25/2023: 72%   SENSATION: Light touch: Impaired - lateral L thigh  MUSCLE LENGTH: Hamstrings: Right DNT; Left DNT Maisie Fus test: Right (+); Left (+)  POSTURE: rounded shoulders, forward head, and increased lumbar lordosis  PALPATION: TTP to R gluteals  LUMBAR ROM:   AROM eval  Flexion WFL  Extension WFL  Right lateral flexion   Left lateral flexion   Right rotation 50% reduced  Left rotation WFL   (Blank rows = not tested)  LOWER EXTREMITY MMT:    MMT Right eval Left eval Right 06/15/23 Left 06/15/23  Hip flexion 3+/5 4/5 4/5 485  Hip extension      Hip abduction 3+/5 3+/5 4/5 4/5  Hip adduction      Hip internal rotation      Hip external rotation      Knee flexion 4/5 5/5 4/5   Knee extension 4/5 5/5 4/5   Ankle dorsiflexion      Ankle plantarflexion      Ankle inversion      Ankle eversion       (Blank rows = not  tested)  LUMBAR SPECIAL TESTS:  Straight leg raise test: Negative and  Slump test: Negative  FUNCTIONAL TESTS:  30 Second Sit to Stand: 8 reps 06/16/2023: 9 reps  GAIT: Distance walked: 34ft Assistive device utilized: Single point cane Level of assistance: Modified independence Comments: antalgic gait to R   TREATMENT: [facility] Adult PT Treatment:                                                DATE: 06/23/2023 Therapeutic Exercise: NuStep L6 x 5 min with UE/LE while taking subjective Pallof press 2x10 13# FM row 2x10 23# Seated knee ext 2x10 25# Single knee ext 2x10 15# Seated knee flex 2x10 55# Standing hip abd/ext 2x10 30# Supine pilates SLR 2x15 STS 2x10 10# KB Step up 2x10 fwd each 8in  [facility] Adult PT Treatment:                                                DATE: 06/15/2023 Therapeutic Exercise: NuStep L6 x 5 min with UE/LE while taking subjective Modified thomas stretch x 60" each Supine clamshell 2x20 black band Bridge with black band 2x10 Supine pilates SLR 2x15 each Seated knee ext 2x10 35# Single leg knee ext 2x10 15# R Seated knee flexion 2x10 50# Standing hip abd/ext 2x10 25# Therapeutic Activity: Assessment of tests/measures, goals, and outcomes  [facility] Adult PT Treatment:                                                DATE: 06/08/2023 Therapeutic Exercise: NuStep L6 x 5 min with UE/LE while taking subjective Modified thomas stretch x 60" each Supine clamshell 2x20 black band Bridge with black band 2x10 Supine pilates SLR 2x15 each Seated knee ext 2x10 25# Seated knee flexion 2x10 50# Standing hip abd 2x10 25#  [facility] Adult PT Treatment:                                                DATE: 05/25/2023 Therapeutic Exercise: NuStep L6 x 6 min with UE/LE while taking subjective Piriformis stretch 2 x 20 sec each LT 4 x 5 sec each Supine pilates SLR 2 x 15 each Bridge 2 x 10 Hooklying unilateral clamshell with black 3 x 10 each Supine 90/90 table top hold 5  x 10 sec Modified crunch with physioball 2 x 10 with 3 sec hold  PATIENT EDUCATION:  Education details: HEP Person educated: Patient Education method: Programmer, multimedia, Demonstration Education comprehension: verbalized understanding and returned demonstration  HOME EXERCISE PROGRAM: Access Code: [email or web address]   ASSESSMENT: CLINICAL IMPRESSION: Pt was able to complete all prescribed exercises with no adverse effect. Therapy focused on continued core and proximal hip strengthening, emphasis placed on R LE secondary to increased contralateral weakness. Pt continues to benefit from skilled PT services as he is improving his strength and decreasing back pain. Will continue to progress as tolerated per POC.   OBJECTIVE IMPAIRMENTS: Abnormal gait, decreased activity tolerance, decreased endurance, decreased mobility, difficulty walking, decreased ROM, decreased strength, and pain.  ACTIVITY LIMITATIONS: lifting, bending, sitting, standing, squatting, sleeping, stairs, transfers, and locomotion level  PARTICIPATION LIMITATIONS: driving, shopping, community activity, occupation, and yard work  PERSONAL FACTORS: 3+ comorbidities: DM II, HTN, Cancer, CHF, ACDF 2022  are also affecting patient's functional outcome.    GOALS: Goals reviewed with patient? No  SHORT TERM GOALS: Target date: 04/26/2023   Pt will be compliant and knowledgeable with initial HEP for improved comfort and carryover Baseline: initial HEP given  Goal status: MET  2.  Pt will self report lower back pain no greater than 1/10 for improved comfort and functional ability Baseline: 2/10 at worst Goal status: MET   LONG TERM GOALS: Target date: 07/28/2023    Pt will improve FOTO function score to no less than 72% as proxy for functional improvement Baseline: 67% function 05/25/2023: 72% Goal status: MET  2.  Pt will self report lower back pain no greater than 0/10 for improved comfort and functional ability Baseline: 2/10 at  worst 06/16/2023: 1/10 at worst Goal status: IN PROGRESS   3.  Pt will increase 30 Second Sit to Stand rep count to no less than 10 reps for improved balance, strength, and functional mobility Baseline: 8 reps  06/15/2023: 9 reps Goal status: IN PROGRESS   4.  Pt will improve all LE MMT to no less than 5/5 for improved comfort and functional mobility with decreased back pain Baseline: see MMT chart Goal status: IN PROGRESS   PLAN: PT FREQUENCY: 1x/week  PT DURATION: 6 weeks  PLANNED INTERVENTIONS: Therapeutic exercises, Therapeutic activity, Neuromuscular re-education, Balance training, Gait training, Patient/Family education, Self Care, Joint mobilization, Aquatic Therapy, Dry Needling, Electrical stimulation, Cryotherapy, Moist heat, Manual therapy, and Re-evaluation.  PLAN FOR NEXT SESSION: assess HEP response, core/hip strengthening, hip flexor stretching   Eloy End PT  06/24/23 8:53 AM

## 2023-06-28 DIAGNOSIS — D649 Anemia, unspecified: Secondary | ICD-10-CM | POA: Diagnosis not present

## 2023-06-29 DIAGNOSIS — M48061 Spinal stenosis, lumbar region without neurogenic claudication: Secondary | ICD-10-CM | POA: Diagnosis not present

## 2023-06-29 LAB — CBC WITH DIFFERENTIAL/PLATELET
Absolute Lymphocytes: 1328 {cells}/uL (ref 850–3900)
Absolute Monocytes: 503 {cells}/uL (ref 200–950)
Basophils Absolute: 19 {cells}/uL (ref 0–200)
Basophils Relative: 0.5 %
Eosinophils Absolute: 59 {cells}/uL (ref 15–500)
Eosinophils Relative: 1.6 %
HCT: 39.5 % (ref 38.5–50.0)
Hemoglobin: 12.4 g/dL — ABNORMAL LOW (ref 13.2–17.1)
MCH: 30.3 pg (ref 27.0–33.0)
MCHC: 31.4 g/dL — ABNORMAL LOW (ref 32.0–36.0)
MCV: 96.6 fL (ref 80.0–100.0)
MPV: 9.7 fL (ref 7.5–12.5)
Monocytes Relative: 13.6 %
Neutro Abs: 1791 {cells}/uL (ref 1500–7800)
Neutrophils Relative %: 48.4 %
Platelets: 246 10*3/uL (ref 140–400)
RBC: 4.09 10*6/uL — ABNORMAL LOW (ref 4.20–5.80)
RDW: 13.8 % (ref 11.0–15.0)
Total Lymphocyte: 35.9 %
WBC: 3.7 10*3/uL — ABNORMAL LOW (ref 3.8–10.8)

## 2023-06-29 LAB — IRON,TIBC AND FERRITIN PANEL
%SAT: 23 % (ref 20–48)
Ferritin: 84 ng/mL (ref 24–380)
Iron: 73 ug/dL (ref 50–180)
TIBC: 320 ug/dL (ref 250–425)

## 2023-06-30 ENCOUNTER — Encounter: Payer: Self-pay | Admitting: Gastroenterology

## 2023-06-30 ENCOUNTER — Ambulatory Visit: Payer: BC Managed Care – PPO | Admitting: Gastroenterology

## 2023-06-30 ENCOUNTER — Telehealth: Payer: Self-pay | Admitting: Gastroenterology

## 2023-06-30 VITALS — BP 123/77 | HR 67 | Temp 98.4°F | Ht 69.0 in | Wt 235.6 lb

## 2023-06-30 DIAGNOSIS — D649 Anemia, unspecified: Secondary | ICD-10-CM

## 2023-06-30 DIAGNOSIS — K219 Gastro-esophageal reflux disease without esophagitis: Secondary | ICD-10-CM

## 2023-06-30 NOTE — Progress Notes (Signed)
GI Office Note    Referring Provider: Billie Lade, MD Primary Care Physician:  Billie Lade, MD  Primary Gastroenterologist: Roetta Sessions, MD   Chief Complaint   Chief Complaint  Patient presents with   Follow-up    Follow up on Normocytic anemia, pt states he is doing ok    History of Present Illness   Andrew Arnold is a 67 y.o. male here for follow-up.  Last seen in April 2024.  History of anemia.  Also with history of prostate cancer diagnosed earlier this year.  Recent labs June 28, 2023.  Hemoglobin was slightly low at 12.4, normal hematocrit of 39.5.  White blood cell count slightly low at 3700, platelets normal at 246.  Iron 73, iron sat 23%, TIBC 320, ferritin 84.  In November his B12 was 608, folate 7.1.  Patient has had mild anemia over the past year.  iFOBT negative back in June.  Completed colonoscopy 04/2022. He had diverticulosis. Next colonoscopy advised in 10 years.   Today: doing well. Recovered from his prostate cancer surgery. No bowel concerns, melena, brbpr, abdominal pain, heartburn, n/v, dysphagia. Continues on oral iron.   Medications   Current Outpatient Medications  Medication Sig Dispense Refill   acetaminophen (TYLENOL) 500 MG tablet Take 500 mg by mouth every 6 (six) hours as needed.     amLODipine (NORVASC) 10 MG tablet Take 1 tablet (10 mg total) by mouth daily. 30 tablet 0   atorvastatin (LIPITOR) 80 MG tablet Take 80 mg by mouth at bedtime.     carvedilol (COREG) 6.25 MG tablet Take 1 tablet (6.25 mg total) by mouth 2 (two) times daily with a meal. 60 tablet 0   cetirizine (ZYRTEC) 10 MG tablet Take 10 mg by mouth in the morning.     ezetimibe (ZETIA) 10 MG tablet Take 1 tablet (10 mg total) by mouth daily. 90 tablet 3   FARXIGA 10 MG TABS tablet TAKE 1 TABLET(10 MG) BY MOUTH DAILY. NEED FOLLOW UP APPOINTMENT FOR MORE REFILLS 30 tablet 0   ferrous sulfate 325 (65 FE) MG tablet Take 325 mg by mouth daily with breakfast.      metFORMIN (GLUCOPHAGE) 1000 MG tablet Take 1,000 mg by mouth 2 (two) times daily.     methocarbamol (ROBAXIN) 500 MG tablet Take 500 mg by mouth 2 (two) times daily as needed.     pantoprazole (PROTONIX) 40 MG tablet Take 40 mg by mouth daily.     potassium chloride SA (KLOR-CON M) 20 MEQ tablet TAKE 1 TABLET BY MOUTH EVERY DAY 90 tablet 3   sacubitril-valsartan (ENTRESTO) 24-26 MG TAKE 1 TABLET BY MOUTH TWICE DAILY 60 tablet 11   spironolactone (ALDACTONE) 25 MG tablet Take 0.5 tablets (12.5 mg total) by mouth daily. NEEDS FOLLOW UP APPOINTMENT FORE MORE REFILLS 15 tablet 6   Vitamin D, Ergocalciferol, (DRISDOL) 1.25 MG (50000 UNIT) CAPS capsule Take 1 capsule (50,000 Units total) by mouth every 7 (seven) days for 12 doses. 12 capsule 0   sildenafil (VIAGRA) 100 MG tablet Take 100 mg by mouth daily as needed. (Patient not taking: Reported on 06/30/2023)     No current facility-administered medications for this visit.    Allergies   Allergies as of 06/30/2023 - Review Complete 06/30/2023  Allergen Reaction Noted   Penicillins  09/17/2021        Review of Systems   General: Negative for anorexia, weight loss, fever, chills, fatigue, weakness. ENT: Negative for  hoarseness, difficulty swallowing , nasal congestion. CV: Negative for chest pain, angina, palpitations, dyspnea on exertion, peripheral edema.  Respiratory: Negative for dyspnea at rest, dyspnea on exertion, cough, sputum, wheezing.  GI: See history of present illness. GU:  Negative for dysuria, hematuria, urinary incontinence, urinary frequency, nocturnal urination.  Endo: Negative for unusual weight change.     Physical Exam   BP 123/77   Pulse 67   Temp 98.4 F (36.9 C)   Ht 5\' 9"  (1.753 m)   Wt 235 lb 9.6 oz (106.9 kg)   BMI 34.79 kg/m    General: Well-nourished, well-developed in no acute distress.  Eyes: No icterus. Mouth: Oropharyngeal mucosa moist and pink  Abdomen: Bowel sounds are normal, nontender,  nondistended, no hepatosplenomegaly or masses,  no abdominal bruits or hernia , no rebound or guarding. Rectus diastasis Rectal: not performed  Extremities: trace bilateral lower extremity edema. No clubbing or deformities. Neuro: Alert and oriented x 4   Skin: Warm and dry, no jaundice.   Psych: Alert and cooperative, normal mood and affect.  Labs   Lab Results  Component Value Date   NA 139 06/04/2023   CL 103 06/04/2023   K 4.9 06/04/2023   CO2 24 06/04/2023   BUN 15 06/04/2023   CREATININE 1.03 06/04/2023   EGFR 79 06/04/2023   CALCIUM 9.6 06/04/2023   PHOS 3.5 05/15/2021   ALBUMIN 4.5 06/04/2023   GLUCOSE 91 06/04/2023   Lab Results  Component Value Date   ALT 15 06/04/2023   AST 15 06/04/2023   ALKPHOS 99 06/04/2023   BILITOT 0.5 06/04/2023   Lab Results  Component Value Date   WBC 3.7 (L) 06/28/2023   HGB 12.4 (L) 06/28/2023   HCT 39.5 06/28/2023   MCV 96.6 06/28/2023   PLT 246 06/28/2023   Lab Results  Component Value Date   IRON 73 06/28/2023   TIBC 320 06/28/2023   FERRITIN 84 06/28/2023   Lab Results  Component Value Date   VITAMINB12 608 06/04/2023   Lab Results  Component Value Date   FOLATE 7.1 06/04/2023   Lab Results  Component Value Date   HGBA1C 6.2 (H) 06/04/2023    Imaging Studies   No results found.  Assessment/Plan:   Normocytic anemia: Likely anemia of chronic disease. No overt or occult GI bleeding. Ifobt negative earlier this year. Hgb slightly low but Hct normal.  Iron/B12/folate all normal. Colonoscopy up to date. No UGI symptoms.  -continue to monitor anemia with PCP, if decline in Hgb he should let us know. He should monitor for any GI symptoms such as abdominal pain, blood in the stool, melena, poorly controlled reflux.   Chronic GERD: well controlled on pantoprazole. Did not discuss while in the office, but will make contact with patient to clarify if he has ever had Barrett's esophagus screening with endoscopy. If he  has had reflux requiring medication for greater than 5 years, given additional risk factors, would advise screening endoscopy.  Return ov as needed.    Leanna Battles. Melvyn Neth, MHS, PA-C [facility]

## 2023-06-30 NOTE — Telephone Encounter (Signed)
Andrew Arnold, patient seen in the office today, can you call tomorrow and clarify how long he has been on medication for acid reflux and if he has ever had an upper endoscopy. If reflux for more than five years and no prior EGD, then he should consider one time upper endoscopy to screen for complications of gerd including Barrett's esophagus (precursor to esophageal cancer).

## 2023-06-30 NOTE — Patient Instructions (Signed)
It was great to see you today! I hope you enjoy your time off over the holidays!  Overall your anemia is very mild and has been stable. Continue to follow with Dr. Durwin Nora, he can keep a check on your labs periodically. You should monitor for any blood in the stools, black stools, stomach pain, reflux, etc. If you have any of these symptoms, please return to see [place]. Otherwise follow up as needed.

## 2023-07-01 DIAGNOSIS — M62838 Other muscle spasm: Secondary | ICD-10-CM | POA: Diagnosis not present

## 2023-07-01 DIAGNOSIS — M6281 Muscle weakness (generalized): Secondary | ICD-10-CM | POA: Diagnosis not present

## 2023-07-01 DIAGNOSIS — N393 Stress incontinence (female) (male): Secondary | ICD-10-CM | POA: Diagnosis not present

## 2023-07-01 NOTE — Telephone Encounter (Signed)
Pt states that he has been on the acid reflux medication for over 6 mths. Pt stated that he has not had an upper endoscopy in the past and states that he had reflux a long time ago and was put on medication but then was taken off of it.

## 2023-07-01 NOTE — Telephone Encounter (Signed)
Pt is aware.  

## 2023-07-01 NOTE — Telephone Encounter (Signed)
OK. It sounds like intermittent GERD or maybe chronic GERD requiring only intermittent PPI. No need for EGD at this time.

## 2023-07-02 ENCOUNTER — Other Ambulatory Visit (HOSPITAL_COMMUNITY): Payer: Self-pay | Admitting: Internal Medicine

## 2023-07-12 ENCOUNTER — Other Ambulatory Visit (HOSPITAL_COMMUNITY): Payer: Self-pay

## 2023-07-12 ENCOUNTER — Ambulatory Visit: Payer: BC Managed Care – PPO

## 2023-07-12 DIAGNOSIS — R2689 Other abnormalities of gait and mobility: Secondary | ICD-10-CM | POA: Diagnosis not present

## 2023-07-12 DIAGNOSIS — M5459 Other low back pain: Secondary | ICD-10-CM | POA: Diagnosis not present

## 2023-07-12 DIAGNOSIS — M6281 Muscle weakness (generalized): Secondary | ICD-10-CM

## 2023-07-12 NOTE — Therapy (Signed)
OUTPATIENT PHYSICAL THERAPY TREATMENT   Patient Name: Andrew Arnold MRN: [id number] DOB:[birth year], 67 y.o., male Today's Date: 07/12/2023   END OF SESSION:  PT End of Session - 07/12/23 0920     Visit Number 10    Number of Visits 14    Date for PT Re-Evaluation 07/28/23    Authorization Type BCBS    PT Start Time 0925    PT Stop Time 1010    PT Time Calculation (min) 45 min    Activity Tolerance Patient tolerated treatment well    Behavior During Therapy [facility] for tasks assessed/performed                   Past Medical History:  Diagnosis Date   Allergic rhinitis due to pollen    on allergy shots   Anemia    Arrhythmia    1980s hospitalized for HTN and ?irregular rhythm but no specific dx but resolved during hospitalization   Coronary artery disease    Diabetes mellitus without complication (HCC)    HTN (hypertension)    Hyperlipidemia    Nausea and vomiting 08/01/2021   OSA (obstructive sleep apnea)    mild obstructive sleep apnea with an AHI of 10.8/h with oxygen saturations as low as 85%.   prostate ca 07/2022   Past Surgical History:  Procedure Laterality Date   ANTERIOR CERVICAL DECOMP/DISCECTOMY FUSION N/A 06/03/2021   Procedure: Anterior Cervical Decompression Fusion  Cervical three-four;  Surgeon: Bedelia Person, MD;  Location: [facility] OR;  Service: Neurosurgery;  Laterality: N/A;   colonoscopy  07/14/2003   Dr. Jena Gauss: normal rectum, sigmoid diverticula in colonic mucosa   COLONOSCOPY N/A 02/15/2014   Procedure: COLONOSCOPY;  Surgeon: Corbin Ade, MD;  [facility];  Service: Endoscopy;  Laterality: N/A;  2:00   COLONOSCOPY WITH PROPOFOL N/A 05/08/2022   Procedure: COLONOSCOPY WITH PROPOFOL;  Surgeon: Corbin Ade, MD;  Location: [facility];  Service: Endoscopy;  Laterality: N/A;  11:00 am   LYMPHADENECTOMY Bilateral 12/17/2022   Procedure: BILATERAL PELVIC LYMPHADENECTOMY;  Surgeon: Heloise Purpura, MD;  Location: [facility];   Service: Urology;  Laterality: Bilateral;   PROSTATE BIOPSY     RIGHT/LEFT HEART CATH AND CORONARY ANGIOGRAPHY N/A 05/23/2021   Procedure: RIGHT/LEFT HEART CATH AND CORONARY ANGIOGRAPHY;  Surgeon: Dolores Patty, MD;  Location: [facility];  Service: Cardiovascular;  Laterality: N/A;   ROBOT ASSISTED LAPAROSCOPIC RADICAL PROSTATECTOMY N/A 12/17/2022   Procedure: XI ROBOTIC ASSISTED LAPAROSCOPIC RADICAL PROSTATECTOMY LEVEL 2;  Surgeon: Heloise Purpura, MD;  Location: [facility];  Service: Urology;  Laterality: N/A;  210 MINUTES NEEDED FOR CASE   Patient Active Problem List   Diagnosis Date Noted   Need for influenza vaccination 06/04/2023   CHF (congestive heart failure) (HCC) 03/04/2023   Hyperlipidemia associated with type 2 diabetes mellitus (HCC) 03/04/2023   Lumbar stenosis 03/04/2023   Prostate cancer (HCC) 12/17/2022   Malignant neoplasm of prostate (HCC) 10/22/2022   OSA (obstructive sleep apnea) 04/23/2022   H/O adenomatous polyp of colon 03/27/2022   Normocytic anemia 03/27/2022   Gait abnormality 03/10/2022   Chronic low back pain 03/10/2022   Paresthesia 03/10/2022   CAP (community acquired pneumonia) 08/01/2021   Nausea and vomiting 08/01/2021   DM2 (diabetes mellitus, type 2) (HCC) 08/01/2021   Coronary artery disease involving native coronary artery of native heart without angina pectoris 07/31/2021   Stenosis of cervical spine with myelopathy (HCC) 06/03/2021   Macrocytic anemia 05/15/2021  Bilateral lower extremity edema 05/15/2021   BPH (benign prostatic hyperplasia) 05/15/2021   Elevated PSA 05/15/2021   Cervical spinal stenosis 05/15/2021   DDD (degenerative disc disease), cervical 05/15/2021   Syncope 05/14/2021   Elevated hemoglobin A1c 04/10/2021   GERD (gastroesophageal reflux disease) 08/29/2017   Encounter for screening colonoscopy 02/01/2014   Essential hypertension 08/14/2008   Seasonal and perennial allergic rhinitis 04/23/2007    PCP: Billie Lade, MD  REFERRING PROVIDER: Janeece Riggers, FNP  REFERRING DIAG: [phone] (ICD-10-CM) - Spinal stenosis, lumbar region without neurogenic claudication   Rationale for Evaluation and Treatment: Rehabilitation  THERAPY DIAG:  Other low back pain  Muscle weakness (generalized)  ONSET DATE: Chronic   SUBJECTIVE:                                                                                                                                                                                          SUBJECTIVE STATEMENT: Pt presents to PT with reports of continued slight lower back and bilateral knee pain. He had a fall at his home two weeks ago due to his R knee buckling. Has continued HEP compliance.   PERTINENT HISTORY:  DM II, HTN, Cancer, CHF, ACDF 2022  PAIN:  Are you having pain?  Yes: NPRS scale: 2-3/10 Worst: 2/10 Pain location: lower back Pain description: sharp Aggravating factors: prolonged sitting Relieving factors: medication, movement  PRECAUTIONS: None  PATIENT GOALS: decrease back pain, start a good exercise program   OBJECTIVE:  PATIENT SURVEYS:  FOTO: 67% function; 72% predicted  05/25/2023: 72%   SENSATION: Light touch: Impaired - lateral L thigh  MUSCLE LENGTH: Hamstrings: Right DNT; Left DNT Maisie Fus test: Right (+); Left (+)  POSTURE: rounded shoulders, forward head, and increased lumbar lordosis  PALPATION: TTP to R gluteals  LUMBAR ROM:   AROM eval  Flexion [facility]  Extension WFL  Right lateral flexion   Left lateral flexion   Right rotation 50% reduced  Left rotation WFL   (Blank rows = not tested)  LOWER EXTREMITY MMT:    MMT Right eval Left eval Right 06/15/23 Left 06/15/23  Hip flexion 3+/5 4/5 4/5 485  Hip extension      Hip abduction 3+/5 3+/5 4/5 4/5  Hip adduction      Hip internal rotation      Hip external rotation      Knee flexion 4/5 5/5 4/5   Knee extension 4/5 5/5 4/5   Ankle dorsiflexion      Ankle  plantarflexion      Ankle inversion      Ankle eversion       (  Blank rows = not tested)  LUMBAR SPECIAL TESTS:  Straight leg raise test: Negative and Slump test: Negative  FUNCTIONAL TESTS:  30 Second Sit to Stand: 8 reps 06/16/2023: 9 reps  GAIT: Distance walked: 1ft Assistive device utilized: Single point cane Level of assistance: Modified independence Comments: antalgic gait to R   TREATMENT: [facility] Adult PT Treatment:                                                DATE: 07/12/2023 Therapeutic Exercise: NuStep L6 x 5 min with UE/LE while taking subjective Pallof press 2x10 13# FM row 2x10 23# Leg press 2x10 80# Supine pilates SLR 2.5# 2x10 Bridge with ball 2x15 Supine 90/90 hold 2x20" Modified crunch with physioball 2x10 3" hold Lateral walk GTB x 3 laps at counter Standing hip abd/ext x 10 GTB TKE x 10 - 5" hold each  [facility] Adult PT Treatment:                                                DATE: 06/23/2023 Therapeutic Exercise: NuStep L6 x 5 min with UE/LE while taking subjective Pallof press 2x10 13# FM row 2x10 23# Seated knee ext 2x10 25# Single knee ext 2x10 15# Seated knee flex 2x10 55# Standing hip abd/ext 2x10 30# Supine pilates SLR 2x15 STS 2x10 10# KB Step up 2x10 fwd each 8in  [facility] Adult PT Treatment:                                                DATE: 06/15/2023 Therapeutic Exercise: NuStep L6 x 5 min with UE/LE while taking subjective Modified thomas stretch x 60" each Supine clamshell 2x20 black band Bridge with black band 2x10 Supine pilates SLR 2x15 each Seated knee ext 2x10 35# Single leg knee ext 2x10 15# R Seated knee flexion 2x10 50# Standing hip abd/ext 2x10 25# Therapeutic Activity: Assessment of tests/measures, goals, and outcomes  PATIENT EDUCATION:  Education details: HEP Person educated: Patient Education method: Programmer, multimedia, Demonstration Education comprehension: verbalized understanding and returned demonstration  HOME  EXERCISE PROGRAM: Access Code: [email or web address]   ASSESSMENT: CLINICAL IMPRESSION: Pt was able to complete all prescribed exercises with no adverse effect. Therapy focused on continued core and proximal hip strengthening, continued to also work on quad strength as pt notes bilateral knee pain and weakness leading to recent fall. Pt continues to benefit from skilled PT services as he is improving his strength and decreasing back pain. Will continue to progress as tolerated per POC.   OBJECTIVE IMPAIRMENTS: Abnormal gait, decreased activity tolerance, decreased endurance, decreased mobility, difficulty walking, decreased ROM, decreased strength, and pain.   ACTIVITY LIMITATIONS: lifting, bending, sitting, standing, squatting, sleeping, stairs, transfers, and locomotion level  PARTICIPATION LIMITATIONS: driving, shopping, community activity, occupation, and yard work  PERSONAL FACTORS: 3+ comorbidities: DM II, HTN, Cancer, CHF, ACDF 2022  are also affecting patient's functional outcome.    GOALS: Goals reviewed with patient? No  SHORT TERM GOALS: Target date: 04/26/2023   Pt will be compliant and knowledgeable with initial HEP for improved comfort and carryover Baseline: initial  HEP given  Goal status: MET  2.  Pt will self report lower back pain no greater than 1/10 for improved comfort and functional ability Baseline: 2/10 at worst Goal status: MET   LONG TERM GOALS: Target date: 07/28/2023    Pt will improve FOTO function score to no less than 72% as proxy for functional improvement Baseline: 67% function 05/25/2023: 72% Goal status: MET  2.  Pt will self report lower back pain no greater than 0/10 for improved comfort and functional ability Baseline: 2/10 at worst 06/16/2023: 1/10 at worst Goal status: IN PROGRESS   3.  Pt will increase 30 Second Sit to Stand rep count to no less than 10 reps for improved balance, strength, and functional mobility Baseline: 8 reps  06/15/2023: 9  reps Goal status: IN PROGRESS   4.  Pt will improve all LE MMT to no less than 5/5 for improved comfort and functional mobility with decreased back pain Baseline: see MMT chart Goal status: IN PROGRESS   PLAN: PT FREQUENCY: 1x/week  PT DURATION: 6 weeks  PLANNED INTERVENTIONS: Therapeutic exercises, Therapeutic activity, Neuromuscular re-education, Balance training, Gait training, Patient/Family education, Self Care, Joint mobilization, Aquatic Therapy, Dry Needling, Electrical stimulation, Cryotherapy, Moist heat, Manual therapy, and Re-evaluation.  PLAN FOR NEXT SESSION: assess HEP response, core/hip strengthening, hip flexor stretching   Eloy End PT  07/12/23 10:10 AM

## 2023-07-20 ENCOUNTER — Ambulatory Visit: Payer: BC Managed Care – PPO | Attending: Nurse Practitioner

## 2023-07-20 DIAGNOSIS — M6281 Muscle weakness (generalized): Secondary | ICD-10-CM | POA: Diagnosis not present

## 2023-07-20 DIAGNOSIS — M5459 Other low back pain: Secondary | ICD-10-CM | POA: Diagnosis not present

## 2023-07-20 DIAGNOSIS — R2689 Other abnormalities of gait and mobility: Secondary | ICD-10-CM | POA: Diagnosis not present

## 2023-07-20 NOTE — Therapy (Signed)
 OUTPATIENT PHYSICAL THERAPY TREATMENT/DISCHARGE  PHYSICAL THERAPY DISCHARGE SUMMARY  Visits from Start of Care: 11  Current functional level related to goals / functional outcomes: See goals and objective   Remaining deficits: See goals and objective   Education / Equipment: HEP   Patient agrees to discharge. Patient goals were met. Patient is being discharged due to meeting the stated rehab goals.   Patient Name: Andrew Arnold MRN: [id number] DOB:22-Apr-1956, 68 y.o., male Today's Date: 07/21/2023   END OF SESSION:  PT End of Session - 07/20/23 1523     Visit Number 11    Number of Visits 14    Date for PT Re-Evaluation 07/28/23    Authorization Type BCBS    PT Start Time 1530    PT Stop Time 1615    PT Time Calculation (min) 45 min    Activity Tolerance Patient tolerated treatment well    Behavior During Therapy WFL for tasks assessed/performed                    Past Medical History:  Diagnosis Date   Allergic rhinitis due to pollen    on allergy  shots   Anemia    Arrhythmia    1980s hospitalized for HTN and ?irregular rhythm but no specific dx but resolved during hospitalization   Coronary artery disease    Diabetes mellitus without complication (HCC)    HTN (hypertension)    Hyperlipidemia    Nausea and vomiting 08/01/2021   OSA (obstructive sleep apnea)    mild obstructive sleep apnea with an AHI of 10.8/h with oxygen saturations as low as 85%.   prostate ca 07/2022   Past Surgical History:  Procedure Laterality Date   ANTERIOR CERVICAL DECOMP/DISCECTOMY FUSION N/A 06/03/2021   Procedure: Anterior Cervical Decompression Fusion  Cervical three-four;  Surgeon: Debby Dorn MATSU, MD;  Location: [facility] OR;  Service: Neurosurgery;  Laterality: N/A;   colonoscopy  07/14/2003   Dr. Shaaron: normal rectum, sigmoid diverticula in colonic mucosa   COLONOSCOPY N/A 02/15/2014   Procedure: COLONOSCOPY;  Surgeon: Lamar CHRISTELLA Shaaron, MD;  [facility];  Service: Endoscopy;  Laterality: N/A;  2:00   COLONOSCOPY WITH PROPOFOL  N/A 05/08/2022   Procedure: COLONOSCOPY WITH PROPOFOL ;  Surgeon: Shaaron Lamar CHRISTELLA, MD;  [facility]: [facility];  Service: Endoscopy;  Laterality: N/A;  11:00 am   LYMPHADENECTOMY Bilateral 12/17/2022   Procedure: BILATERAL PELVIC LYMPHADENECTOMY;  Surgeon: Renda Glance, MD;  Location: [facility];  Service: Urology;  Laterality: Bilateral;   PROSTATE BIOPSY     RIGHT/LEFT HEART CATH AND CORONARY ANGIOGRAPHY N/A 05/23/2021   Procedure: RIGHT/LEFT HEART CATH AND CORONARY ANGIOGRAPHY;  Surgeon: Cherrie Toribio SAUNDERS, MD;  Location: [facility];  Service: Cardiovascular;  Laterality: N/A;   ROBOT ASSISTED LAPAROSCOPIC RADICAL PROSTATECTOMY N/A 12/17/2022   Procedure: XI ROBOTIC ASSISTED LAPAROSCOPIC RADICAL PROSTATECTOMY LEVEL 2;  Surgeon: Renda Glance, MD;  Location: [facility];  Service: Urology;  Laterality: N/A;  210 MINUTES NEEDED FOR CASE   Patient Active Problem List   Diagnosis Date Noted   Need for influenza vaccination 06/04/2023   CHF (congestive heart failure) (HCC) 03/04/2023   Hyperlipidemia associated with type 2 diabetes mellitus (HCC) 03/04/2023   Lumbar stenosis 03/04/2023   Prostate cancer (HCC) 12/17/2022   Malignant neoplasm of prostate (HCC) 10/22/2022   OSA (obstructive sleep apnea) 04/23/2022   H/O adenomatous polyp of colon 03/27/2022   Normocytic anemia 03/27/2022   Gait abnormality 03/10/2022  Chronic low back pain 03/10/2022   Paresthesia 03/10/2022   CAP (community acquired pneumonia) 08/01/2021   Nausea and vomiting 08/01/2021   DM2 (diabetes mellitus, type 2) (HCC) 08/01/2021   Coronary artery disease involving native coronary artery of native heart without angina pectoris 07/31/2021   Stenosis of cervical spine with myelopathy (HCC) 06/03/2021   Macrocytic anemia 05/15/2021   Bilateral lower extremity edema 05/15/2021   BPH (benign prostatic hyperplasia) 05/15/2021   Elevated PSA  05/15/2021   Cervical spinal stenosis 05/15/2021   DDD (degenerative disc disease), cervical 05/15/2021   Syncope 05/14/2021   Elevated hemoglobin A1c 04/10/2021   GERD (gastroesophageal reflux disease) 08/29/2017   Encounter for screening colonoscopy 02/01/2014   Essential hypertension 08/14/2008   Seasonal and perennial allergic rhinitis 04/23/2007    PCP: Melvenia Manus BRAVO, MD  REFERRING PROVIDER: Garnell Harlene CROME, FNP  REFERRING DIAG: [phone] (ICD-10-CM) - Spinal stenosis, lumbar region without neurogenic claudication   Rationale for Evaluation and Treatment: Rehabilitation  THERAPY DIAG:  Other low back pain  Muscle weakness (generalized)  Other abnormalities of gait and mobility  ONSET DATE: Chronic   SUBJECTIVE:                                                                                                                                                                                          SUBJECTIVE STATEMENT: Pt presents to PT with continued reports of improving symptoms. Has continued HEP compliance and has been going to the [facility] in [place].   PERTINENT HISTORY:  DM II, HTN, Cancer, CHF, ACDF 2022  PAIN:  Are you having pain?  Yes: NPRS scale: 2-3/10 Worst: 2/10 Pain location: lower back Pain description: sharp Aggravating factors: prolonged sitting Relieving factors: medication, movement  PRECAUTIONS: None  PATIENT GOALS: decrease back pain, start a good exercise program   OBJECTIVE:  PATIENT SURVEYS:  FOTO: 67% function; 72% predicted  05/25/2023: 72%   SENSATION: Light touch: Impaired - lateral L thigh  MUSCLE LENGTH: Hamstrings: Right DNT; Left DNT Debby test: Right (+); Left (+)  POSTURE: rounded shoulders, forward head, and increased lumbar lordosis  PALPATION: TTP to R gluteals  LUMBAR ROM:   AROM eval  Flexion [facility]  Extension WFL  Right lateral flexion   Left lateral flexion   Right rotation 50% reduced  Left rotation  WFL   (Blank rows = not tested)  LOWER EXTREMITY MMT:    MMT Right eval Left eval Right 06/15/23 Left 06/15/23 Right 07/20/2023 Left 07/20/2023  Hip flexion 3+/5 4/5 4/5 4/5 4/5 4/5  Hip extension        Hip abduction 3+/5 3+/5 4/5 4/5  4/5 4/5  Hip adduction        Hip internal rotation        Hip external rotation        Knee flexion 4/5 5/5 4/5     Knee extension 4/5 5/5 4/5     Ankle dorsiflexion        Ankle plantarflexion        Ankle inversion        Ankle eversion         (Blank rows = not tested)  LUMBAR SPECIAL TESTS:  Straight leg raise test: Negative and Slump test: Negative  FUNCTIONAL TESTS:  30 Second Sit to Stand: 8 reps 06/16/2023: 9 reps 07/20/2023: 9 reps  GAIT: Distance walked: 5ft Assistive device utilized: Single point cane Level of assistance: Modified independence Comments: antalgic gait to R   TREATMENT: [facility] Adult PT Treatment:                                                DATE: 07/20/2023 Therapeutic Exercise: NuStep L6 x 5 min with UE/LE while taking subjective Seated knee ext 20# x 10 Single leg knee ext 10# x 10 Seated knee flexion 45# x 10 Pallof press 2x10 13# Bridge with black band 2x15 Supine clamshell 2x15 black band Lateral walk blue band x 3 laps Standing hip abd/ext 2x10 blue band Therapeutic Activity: Assessment of tests/measures, goals, and outcomes for discharge  [facility] Adult PT Treatment:                                                DATE: 07/12/2023 Therapeutic Exercise: NuStep L6 x 5 min with UE/LE while taking subjective Pallof press 2x10 13# FM row 2x10 23# Leg press 2x10 80# Supine pilates SLR 2.5# 2x10 Bridge with ball 2x15 Supine 90/90 hold 2x20 Modified crunch with physioball 2x10 3 hold Lateral walk GTB x 3 laps at counter Standing hip abd/ext x 10 GTB TKE x 10 - 5 hold each  [facility] Adult PT Treatment:                                                DATE: 06/23/2023 Therapeutic Exercise: NuStep L6 x 5 min  with UE/LE while taking subjective Pallof press 2x10 13# FM row 2x10 23# Seated knee ext 2x10 25# Single knee ext 2x10 15# Seated knee flex 2x10 55# Standing hip abd/ext 2x10 30# Supine pilates SLR 2x15 STS 2x10 10# KB Step up 2x10 fwd each 8in  [facility] Adult PT Treatment:                                                DATE: 06/15/2023 Therapeutic Exercise: NuStep L6 x 5 min with UE/LE while taking subjective Modified thomas stretch x 60 each Supine clamshell 2x20 black band Bridge with black band 2x10 Supine pilates SLR 2x15 each Seated knee ext 2x10 35# Single leg knee ext 2x10 15# R Seated knee flexion 2x10 50#  Standing hip abd/ext 2x10 25# Therapeutic Activity: Assessment of tests/measures, goals, and outcomes  PATIENT EDUCATION:  Education details: HEP Person educated: Patient Education method: Programmer, Multimedia, Demonstration Education comprehension: verbalized understanding and returned demonstration  HOME EXERCISE PROGRAM: Access Code: [email or web address]   ASSESSMENT: CLINICAL IMPRESSION: Pt was able to complete all prescribed exercises and demonstrated knowledge of HEP with no adverse effect. Over the course of PT treatment he has progressed very well, with noted decrease in back pain and improved strength and functional mobility. He should continue to improve with HEP compliance and is in agreement to discharge from skilled PT services at this time.   OBJECTIVE IMPAIRMENTS: Abnormal gait, decreased activity tolerance, decreased endurance, decreased mobility, difficulty walking, decreased ROM, decreased strength, and pain.   ACTIVITY LIMITATIONS: lifting, bending, sitting, standing, squatting, sleeping, stairs, transfers, and locomotion level  PARTICIPATION LIMITATIONS: driving, shopping, community activity, occupation, and yard work  PERSONAL FACTORS: 3+ comorbidities: DM II, HTN, Cancer, CHF, ACDF 2022  are also affecting patient's functional outcome.    GOALS: Goals reviewed  with patient? No  SHORT TERM GOALS: Target date: 04/26/2023   Pt will be compliant and knowledgeable with initial HEP for improved comfort and carryover Baseline: initial HEP given  Goal status: MET  2.  Pt will self report lower back pain no greater than 1/10 for improved comfort and functional ability Baseline: 2/10 at worst Goal status: MET   LONG TERM GOALS: Target date: 07/28/2023    Pt will improve FOTO function score to no less than 72% as proxy for functional improvement Baseline: 67% function 05/25/2023: 72% Goal status: MET  2.  Pt will self report lower back pain no greater than 0/10 for improved comfort and functional ability Baseline: 2/10 at worst 06/16/2023: 1/10 at worst Goal status: MET   3.  Pt will increase 30 Second Sit to Stand rep count to no less than 10 reps for improved balance, strength, and functional mobility Baseline: 8 reps  06/15/2023: 9 reps 07/20/2023: 9 reps Goal status: MOSTLY MET   4.  Pt will improve all LE MMT to no less than 5/5 for improved comfort and functional mobility with decreased back pain Baseline: see MMT chart Goal status: MOSTLY MET   PLAN: PT FREQUENCY: 1x/week  PT DURATION: 6 weeks  PLANNED INTERVENTIONS: Therapeutic exercises, Therapeutic activity, Neuromuscular re-education, Balance training, Gait training, Patient/Family education, Self Care, Joint mobilization, Aquatic Therapy, Dry Needling, Electrical stimulation, Cryotherapy, Moist heat, Manual therapy, and Re-evaluation.  PLAN FOR NEXT SESSION: assess HEP response, core/hip strengthening, hip flexor stretching   Alm JAYSON Kingdom PT  07/21/23 8:23 AM

## 2023-07-21 NOTE — Progress Notes (Signed)
[address]       [place] [address]             [phone]   PCP is Billie Lade, MD Referring Provider is Billie Lade, MD  Chief Complaint: Ascending thoracic aortic aneurysm   HPI: This is a 68 year old male with a past medical history of hypertension, DM, hyperlipidemia, anemia, CAD, OSA, and prostate cancer who was incidentally found to have a 4.5 cm ectatic ascending thoracic aortic aneurysm 2023. Subsequent scans have showed the ATAA to be 4.4 cm or less. Patient denies chest pain, pressure, or tightness. He is having low back pain and throbbing while lying down of right knee. He sees [facility] and has been told they are monitoring L3-L5.  Past Medical History:  Diagnosis Date   Allergic rhinitis due to pollen    on allergy shots   Anemia    Arrhythmia    1980s hospitalized for HTN and ?irregular rhythm but no specific dx but resolved during hospitalization   Coronary artery disease    Diabetes mellitus without complication (HCC)    HTN (hypertension)    Hyperlipidemia    Nausea and vomiting 08/01/2021   OSA (obstructive sleep apnea)    mild obstructive sleep apnea with an AHI of 10.8/h with oxygen saturations as low as 85%.   prostate ca 07/2022    Past Surgical History:  Procedure Laterality Date   ANTERIOR CERVICAL DECOMP/DISCECTOMY FUSION N/A 06/03/2021   Procedure: Anterior Cervical Decompression Fusion  Cervical three-four;  Surgeon: Bedelia Person, MD;  Location: [facility] OR;  Service: Neurosurgery;  Laterality: N/A;   colonoscopy  07/14/2003   Dr. Jena Gauss: normal rectum, sigmoid diverticula in colonic mucosa   COLONOSCOPY N/A 02/15/2014   Procedure: COLONOSCOPY;  Surgeon: Corbin Ade, MD;  Location: [facility] SUITE;  Service: Endoscopy;  Laterality: N/A;  2:00   COLONOSCOPY WITH PROPOFOL N/A 05/08/2022   Procedure: COLONOSCOPY WITH PROPOFOL;  Surgeon: Corbin Ade, MD;  Location: [facility] SU[facility];  Service: Endoscopy;  Laterality:  N/A;  11:00 am   LYMPHADENECTOMY Bilateral 12/17/2022   Procedure: BILATERAL PELVIC LYMPHADENECTOMY;  Surgeon: Heloise Purpura, MD;  Location: [facility] ORS;  Service: Urology;  Laterality: Bilateral;   PROSTATE BIOPSY     RIGHT/LEFT HEART CATH AND CORONARY ANGIOGRAPHY N/A 05/23/2021   Procedure: RIGHT/LEFT HEART CATH AND CORONARY ANGIOGRAPHY;  Surgeon: Dolores Patty, MD;  Location: [facility] INVASIVE CV LAB;  Service: Cardiovascular;  Laterality: N/A;   ROBOT ASSISTED LAPAROSCOPIC RADICAL PROSTATECTOMY N/A 12/17/2022   Procedure: XI ROBOTIC ASSISTED LAPAROSCOPIC RADICAL PROSTATECTOMY LEVEL 2;  Surgeon: Heloise Purpura, MD;  Location: [facility] ORS;  Service: Urology;  Laterality: N/A;  210 MINUTES NEEDED FOR CASE    Family History  Problem Relation Age of Onset   Breast cancer Mother 72   Cirrhosis Father        cirrhosis of the liver; alcohol related   Diabetes Sister    Other Other        neice was on allergy vaccine when young   Colon cancer Neg Hx     Social History Social History   Tobacco Use   Smoking status: Never   Smokeless tobacco: Never  Vaping Use   Vaping status: Never Used  Substance Use Topics   Alcohol use: No   Drug use: No    Current Outpatient Medications  Medication Sig Dispense Refill   acetaminophen (TYLENOL) 500 MG tablet Take 500  mg by mouth every 6 (six) hours as needed.     amLODipine (NORVASC) 10 MG tablet Take 1 tablet (10 mg total) by mouth daily. 30 tablet 0   atorvastatin (LIPITOR) 80 MG tablet Take 80 mg by mouth at bedtime.     carvedilol (COREG) 6.25 MG tablet Take 1 tablet (6.25 mg total) by mouth 2 (two) times daily with a meal. 60 tablet 0   cetirizine (ZYRTEC) 10 MG tablet Take 10 mg by mouth in the morning.     ezetimibe (ZETIA) 10 MG tablet Take 1 tablet (10 mg total) by mouth daily. 90 tablet 3   FARXIGA 10 MG TABS tablet TAKE 1 TABLET(10 MG) BY MOUTH DAILY. NEED FOLLOW UP APPOINTMENT FOR MORE REFILLS 30 tablet 0   ferrous sulfate 325 (65 FE) MG tablet  Take 325 mg by mouth daily with breakfast.     metFORMIN (GLUCOPHAGE) 1000 MG tablet Take 1,000 mg by mouth 2 (two) times daily.     methocarbamol (ROBAXIN) 500 MG tablet Take 500 mg by mouth 2 (two) times daily as needed.     pantoprazole (PROTONIX) 40 MG tablet Take 40 mg by mouth daily.     potassium chloride SA (KLOR-CON M) 20 MEQ tablet TAKE 1 TABLET BY MOUTH EVERY DAY 90 tablet 3   sacubitril-valsartan (ENTRESTO) 24-26 MG TAKE 1 TABLET BY MOUTH TWICE DAILY 60 tablet 11   sildenafil (VIAGRA) 100 MG tablet Take 100 mg by mouth daily as needed. (Patient not taking: Reported on 06/30/2023)     spironolactone (ALDACTONE) 25 MG tablet Take 0.5 tablets (12.5 mg total) by mouth daily. NEEDS FOLLOW UP APPOINTMENT FORE MORE REFILLS 15 tablet 6   Vitamin D, Ergocalciferol, (DRISDOL) 1.25 MG (50000 UNIT) CAPS capsule Take 1 capsule (50,000 Units total) by mouth every 7 (seven) days for 12 doses. 12 capsule 0   Allergies  Allergen Reactions   Penicillins     "I almost forgot I was allergic to it" cannot define what allergy is    Vital Signs: Vitals:   08/04/23 1408  BP: 122/75  Pulse: 68  Resp: 18  SpO2: 99%       Physical Exam: CV- Neck- Pulmonary- Abdomen- Extremities- Neurologic-   Diagnostic Tests: Narrative & Impression  CLINICAL DATA:  Thoracic aortic aneurysm follow-up.   EXAM: CT ANGIOGRAPHY CHEST WITH CONTRAST   TECHNIQUE: Multidetector CT imaging of the chest was performed using the standard protocol during bolus administration of intravenous contrast. Multiplanar CT image reconstructions and MIPs were obtained to evaluate the vascular anatomy.   RADIATION DOSE REDUCTION: This exam was performed according to the departmental dose-optimization program which includes automated exposure control, adjustment of the mA and/or kV according to patient size and/or use of iterative reconstruction technique.   CONTRAST:  75mL ISOVUE-370 IOPAMIDOL (ISOVUE-370) INJECTION  76%   COMPARISON:  CT chest dated June 26, 2022.   FINDINGS: Cardiovascular: 4.2 cm ascending thoracic aortic aneurysm, previously 4.2 cm (by my measurements). No dissection. Mild atherosclerotic calcification. No pulmonary embolism. Normal heart size. Unchanged trace pericardial effusion.   Mediastinum/Nodes: No enlarged mediastinal, hilar, or axillary lymph nodes. Thyroid gland, trachea, and esophagus demonstrate no significant findings.   Lungs/Pleura: No focal consolidation, pleural effusion, or pneumothorax.   Upper Abdomen: No acute abnormality.  Unchanged small hiatal hernia.   Musculoskeletal: No chest wall abnormality. No acute or significant osseous findings.   Review of the MIP images confirms the above findings.   IMPRESSION: 1. Unchanged 4.2 cm ascending  thoracic aortic aneurysm. Recommend annual imaging followup by CTA or MRA. This recommendation follows 2010 ACCF/AHA/AATS/ACR/ASA/SCA/SCAI/SIR/STS/SVM Guidelines for the Diagnosis and Management of Patients with Thoracic Aortic Disease. Circulation. 2010; 121: G295-M841. Aortic aneurysm NOS (ICD10-I71.9) 2.  Aortic atherosclerosis (ICD10-I70.0).     Electronically Signed   By: Obie Dredge M.D.   On: 08/04/2023 12:47   Impression and Plan: CTA with a 4.2  cm ascending aortic aneurysm.  Echocardiogram done November 2022 showed the aortic valve is tricuspid, no aortic valve regurgitation or aortic stenosis is present . We discussed the natural history and and risk factors for growth of ascending aortic aneurysms.  We covered the importance of smoking cessation, tight blood pressure control, refraining from lifting heavy objects, and avoiding fluoroquinolones.  The patient is aware of signs and symptoms of aortic dissection and when to present to the emergency department.  Since all subsequent scans have been below 4.4 cm, we will follow yearly with CTA.  Ardelle Balls, PA-C [facility] [phone]

## 2023-07-22 DIAGNOSIS — B351 Tinea unguium: Secondary | ICD-10-CM | POA: Diagnosis not present

## 2023-07-27 ENCOUNTER — Encounter: Payer: Self-pay | Admitting: Thoracic Surgery (Cardiothoracic Vascular Surgery)

## 2023-08-03 ENCOUNTER — Other Ambulatory Visit (HOSPITAL_COMMUNITY): Payer: Self-pay | Admitting: Family Medicine

## 2023-08-03 ENCOUNTER — Other Ambulatory Visit: Payer: Self-pay | Admitting: Internal Medicine

## 2023-08-04 ENCOUNTER — Ambulatory Visit
Admission: RE | Admit: 2023-08-04 | Discharge: 2023-08-04 | Disposition: A | Discharge status: Home / Self Care | Payer: BC Managed Care – PPO | Source: Ambulatory Visit | Attending: Thoracic Surgery (Cardiothoracic Vascular Surgery) | Admitting: Thoracic Surgery (Cardiothoracic Vascular Surgery)

## 2023-08-04 ENCOUNTER — Encounter: Payer: Self-pay | Admitting: Physician Assistant

## 2023-08-04 ENCOUNTER — Other Ambulatory Visit (HOSPITAL_COMMUNITY): Payer: Self-pay

## 2023-08-04 ENCOUNTER — Ambulatory Visit: Payer: BC Managed Care – PPO | Admitting: Physician Assistant

## 2023-08-04 VITALS — BP 122/75 | HR 68 | Resp 18 | Ht 69.0 in | Wt 233.0 lb

## 2023-08-04 DIAGNOSIS — I7121 Aneurysm of the ascending aorta, without rupture: Secondary | ICD-10-CM | POA: Diagnosis not present

## 2023-08-04 DIAGNOSIS — I5032 Chronic diastolic (congestive) heart failure: Secondary | ICD-10-CM

## 2023-08-04 MED ORDER — PANTOPRAZOLE SODIUM 40 MG PO TBEC: 40.0000 mg | DELAYED_RELEASE_TABLET | Freq: Every day | ORAL | 1 refills | Status: DC

## 2023-08-04 MED ORDER — IOPAMIDOL (ISOVUE-370) INJECTION 76%
75.0000 mL | Freq: Once | INTRAVENOUS | Status: AC | PRN
  Administered 2023-08-04: 75 mL via INTRAVENOUS

## 2023-08-04 NOTE — Patient Instructions (Signed)
  Risk Modification in those with ascending thoracic aortic aneurysm:  Continue good control of blood pressure (prefer SBP 130/80 or less)-continue Carvedilol (Coreg) and Amlodipine (Norvasc)  2. Avoid fluoroquinolone antibiotics (I.e Ciprofloxacin, Avelox, Levofloxacin, Ofloxacin)  3.  Use of statin (to decrease cardiovascular risk)-continue Atorvastatin (Lipitor)  4.  Exercise and activity limitations is individualized, but in general, contact sports are to be  avoided and one should avoid heavy lifting (defined as half of ideal body weight) and exercises involving sustained Valsalva maneuver.  5. Counseling for those suspected of having genetically mediated disease. First-degree relatives of those with TAA disease should be screened as well as those who have a connective tissue disease (I.e with Marfan syndrome, Ehlers-Danlos syndrome,  and Loeys-Dietz syndrome) or a  bicuspid aortic valve,have an increased risk for complications related to TAA. Patient with no family history of connective tissue disease. Echo done November 2022 showed the aortic valve is tricuspid, no aortic valve regurgitation or aortic stenosis is present .  6. He does not have a history of tobacco abuse

## 2023-08-10 DIAGNOSIS — M48061 Spinal stenosis, lumbar region without neurogenic claudication: Secondary | ICD-10-CM | POA: Diagnosis not present

## 2023-08-25 DIAGNOSIS — B351 Tinea unguium: Secondary | ICD-10-CM | POA: Diagnosis not present

## 2023-08-25 NOTE — Progress Notes (Signed)
 ADVANCED HF CLINIC NOTE   Primary Care: Dr Sudie Bailey  HF Cardiologist: Dr. Gala Romney Neuro Surgeon: Dr Maisie Fus   HPI: Andrew Arnold is a 68 y.o. with history of syncope, HTN, DM2, HL and diastolic HF  On 05/15/2021 presented ED with syncope. Standing in front of the refrigerator then woke up on the floor.  EKG SR 64 bpm. ECHO EF 65-70%, LVH, grade II DD, and normal RV. Carotids and lower extremity dopplers normal.  Seen in [facility] with progessive LE edema and SOB. Meds adjusted and referred for cath as part of pre-op clearance. Cath 05/23/21 as below non-obstructive CAD and normal RHC.  He underwent successful cervical decompression on 06/03/21 without complication.   Admitted 1/23 with PNA/sepsis. CT chest/abd/pelvis findings suspicious for PNA vs malignancy. Hospitalization c/b AKI and low BP, Entresto, HCTZ, and clonidine stopped at discharge.  Today he returns for HF follow up. Going to outpatient rehab at [facility] 2x/week and getting around better. Using single cane. Denies SOB, edema, orthopnea or PND. Compliant with meds. No dizziness or low BP.   Cardiac Studies: - Echo (11/22): EF 65-70%, LVH, grade II DD, and normal RV.   - Cath 05/23/21     Prox RCA lesion is 20% stenosed.   Prox Cx lesion is 30% stenosed.   Prox LAD to Mid LAD lesion is 30% stenosed.   Dist LAD-1 lesion is 50% stenosed.   Dist LAD-2 lesion is 70% stenosed.   The left ventricular ejection fraction is greater than 65% by visual estimate. Ao  = 132/81 (99) LV = 147/10 RA = 2 RV = 26/6 PA = 24/1 (14) PCW = 6 Fick cardiac output/index = 8.0/3.5 PVR = 1.0 WU Ao sat = 99% PA sat = 72%, 73% SVC sat = 76%   Assessment: 1. Mild to moderate non-obstructive CAD 2. EF 65-70% 3. Normal filling pressures 4. High cardiac output with no evidence of intracardiac shunting   Past Medical History:  Diagnosis Date   Allergic rhinitis due to pollen    on allergy shots   Anemia    Arrhythmia    1980s  hospitalized for HTN and ?irregular rhythm but no specific dx but resolved during hospitalization   Coronary artery disease    Diabetes mellitus without complication (HCC)    HTN (hypertension)    Hyperlipidemia    Nausea and vomiting 08/01/2021   OSA (obstructive sleep apnea)    mild obstructive sleep apnea with an AHI of 10.8/h with oxygen saturations as low as 85%.   prostate ca 07/2022   Current Outpatient Medications  Medication Sig Dispense Refill   acetaminophen (TYLENOL) 500 MG tablet Take 500 mg by mouth every 6 (six) hours as needed.     amLODipine (NORVASC) 10 MG tablet Take 1 tablet (10 mg total) by mouth daily. 30 tablet 0   atorvastatin (LIPITOR) 80 MG tablet Take 80 mg by mouth at bedtime.     carvedilol (COREG) 6.25 MG tablet Take 1 tablet (6.25 mg total) by mouth 2 (two) times daily with a meal. 60 tablet 0   cetirizine (ZYRTEC) 10 MG tablet Take 10 mg by mouth in the morning.     ezetimibe (ZETIA) 10 MG tablet Take 1 tablet (10 mg total) by mouth daily. 90 tablet 3   FARXIGA 10 MG TABS tablet TAKE 1 TABLET(10 MG) BY MOUTH DAILY. NEED FOLLOW UP APPOINTMENT FOR MORE REFILLS 30 tablet 0   ferrous sulfate 325 (65 FE) MG tablet Take 325 mg  by mouth daily with breakfast.     metFORMIN (GLUCOPHAGE) 1000 MG tablet Take 1,000 mg by mouth 2 (two) times daily.     pantoprazole (PROTONIX) 40 MG tablet Take 1 tablet (40 mg total) by mouth daily. 30 tablet 1   potassium chloride SA (KLOR-CON M) 20 MEQ tablet Take 1 tablet (20 mEq total) by mouth daily. NEEDS FOLLOW UP APPOINTMENT FOR ANYMORE REFILLS 30 tablet 0   sacubitril-valsartan (ENTRESTO) 24-26 MG TAKE 1 TABLET BY MOUTH TWICE DAILY 60 tablet 11   sildenafil (VIAGRA) 100 MG tablet Take 100 mg by mouth daily as needed.     spironolactone (ALDACTONE) 25 MG tablet TAKE 1/2 TABLET(12.5 MG) BY MOUTH DAILY. NEED FOLLOW UP APPOINTMENT FORE MORE REFILLS 15 tablet 0   No current facility-administered medications for this visit.    Allergies  Allergen Reactions   Penicillins     "I almost forgot I was allergic to it" cannot define what allergy is   Quinolones     ATAA (ascending thoracic aortic aneurysm)    Social History   Socioeconomic History   Marital status: Single    Spouse name: Not on file   Number of children: Not on file   Years of education: Not on file   Highest education level: Master's degree (e.g., MA, MS, MEng, MEd, MSW, MBA)  Occupational History   Occupation: school counselor  Tobacco Use   Smoking status: Never   Smokeless tobacco: Never  Vaping Use   Vaping status: Never Used  Substance and Sexual Activity   Alcohol use: No   Drug use: No   Sexual activity: Yes  Other Topics Concern   Not on file  Social History Narrative   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[place], [place] Lives in [place]   Social Drivers of Health   Financial Resource Strain: Low Risk  (05/31/2023)   Overall Financial Resource Strain (CARDIA)    Difficulty of Paying Living Expenses: Not hard at all  Food Insecurity: No Food Insecurity (05/31/2023)   Hunger Vital Sign    Worried About Running Out of Food in the Last Year: Never true    Ran Out of Food in the Last Year: Never true  Transportation Needs: No Transportation Needs (05/31/2023)   PRAPARE - Administrator, Civil Service (Medical): No    Lack of Transportation (Non-Medical): No  Physical Activity: Insufficiently Active (05/31/2023)   Exercise Vital Sign    Days of Exercise per Week: 2 days    Minutes of Exercise per Session: 20 min  Stress: No Stress Concern Present (05/31/2023)   [employer] of Occupational Health - Occupational Stress Questionnaire    Feeling of Stress : Not at all  Social Connections: Moderately Isolated (05/31/2023)   Social Connection and Isolation Panel [NHANES]    Frequency of Communication with Friends and Family: Once a week    Frequency of Social Gatherings with Friends and Family: Once a week    Attends  Religious Services: 1 to 4 times per year    Active Member of [employer] or Organizations: Yes    Attends Banker Meetings: 1 to 4 times per year    Marital Status: Never married  Intimate Partner Violence: Not At Risk (12/17/2022)   Humiliation, Afraid, Rape, and Kick questionnaire    Fear of Current or Ex-Partner: No    Emotionally Abused: No    Physically Abused: No    Sexually Abused: No   Family History  Problem Relation Age  of Onset   Breast cancer Mother 56   Cirrhosis Father        cirrhosis of the liver; alcohol related   Diabetes Sister    Other Other        neice was on allergy vaccine when young   Colon cancer Neg Hx    There were no vitals taken for this visit.  Wt Readings from Last 3 Encounters:  08/04/23 105.7 kg (233 lb)  06/30/23 106.9 kg (235 lb 9.6 oz)  06/04/23 105.1 kg (231 lb 9.6 oz)   PHYSICAL EXAM: General:  Well appearing. No resp difficulty HEENT: normal Neck: supple. no JVD. Carotids 2+ bilat; no bruits. No lymphadenopathy or thryomegaly appreciated. Cor: PMI nondisplaced. Regular rate & rhythm. No rubs, gallops or murmurs. Lungs: clear Abdomen: obese soft, nontender, nondistended. No hepatosplenomegaly. No bruits or masses. Good bowel sounds. Extremities: no cyanosis, clubbing, rash, tr edema + compression hose Neuro: alert & orientedx3, cranial nerves grossly intact. moves all 4 extremities w/o difficulty. Affect pleasant   ASSESSMENT & PLAN:  1. CAD - Cath 11/22 with moderate non-obstructive CAD  - No s/s angina - Continue atorva + ASA. - Lipids managed by PCP  2. Chronic HFpEF - Echo 05/15/21 EF 65-70% RV normal - Cath 11/22 normal filling pressures. May have component of venous insufficiency. - NYHA I-II, volume looks good today. - Continue Farxiga 10 mg daily.  - Continue Entresto 24/26 mg bid. - Continue carvedilol 6.25 mg bid. - Encouraged compression hose. - Consider PYP in future.  3.Syncope  - Had syncopal episode  05/14/21 . Unclear etiology.  - Evaluated in the ED - Carotid Dopplers negative. Echo EF 65-70%  - Zio 11/22 No significant dysrhythmias.  - No driving x 6 months (~1/61) - No recurrence  4. HTN  - BP elevated today. - Better control on home readings. - Continue to check BP at home and log. Bring readings to next visit.  5. DMII - Continue Francoise Ceo, FNP  2:18 PM

## 2023-08-26 ENCOUNTER — Telehealth (HOSPITAL_COMMUNITY): Payer: Self-pay

## 2023-08-26 NOTE — Telephone Encounter (Signed)
Called and left patient a voice message to confirm/remind patient of their appointment at the [facility] on 08/27/23.   And to bring in all medications and/or complete list.

## 2023-08-27 ENCOUNTER — Ambulatory Visit (HOSPITAL_COMMUNITY)
Admission: RE | Admit: 2023-08-27 | Discharge: 2023-08-27 | Disposition: A | Discharge status: Home / Self Care | Payer: BC Managed Care – PPO | Source: Ambulatory Visit | Attending: Family Medicine | Admitting: Family Medicine

## 2023-08-27 ENCOUNTER — Encounter (HOSPITAL_COMMUNITY): Payer: Self-pay

## 2023-08-27 VITALS — BP 120/64 | HR 55 | Ht 69.0 in | Wt 234.6 lb

## 2023-08-27 DIAGNOSIS — E1100 Type 2 diabetes mellitus with hyperosmolarity without nonketotic hyperglycemic-hyperosmolar coma (NKHHC): Secondary | ICD-10-CM | POA: Diagnosis not present

## 2023-08-27 DIAGNOSIS — I110 Hypertensive heart disease with heart failure: Secondary | ICD-10-CM | POA: Insufficient documentation

## 2023-08-27 DIAGNOSIS — E785 Hyperlipidemia, unspecified: Secondary | ICD-10-CM | POA: Insufficient documentation

## 2023-08-27 DIAGNOSIS — Z7984 Long term (current) use of oral hypoglycemic drugs: Secondary | ICD-10-CM | POA: Insufficient documentation

## 2023-08-27 DIAGNOSIS — I447 Left bundle-branch block, unspecified: Secondary | ICD-10-CM | POA: Diagnosis not present

## 2023-08-27 DIAGNOSIS — I5032 Chronic diastolic (congestive) heart failure: Secondary | ICD-10-CM

## 2023-08-27 DIAGNOSIS — E119 Type 2 diabetes mellitus without complications: Secondary | ICD-10-CM | POA: Diagnosis not present

## 2023-08-27 DIAGNOSIS — I10 Essential (primary) hypertension: Secondary | ICD-10-CM | POA: Diagnosis not present

## 2023-08-27 DIAGNOSIS — I2510 Atherosclerotic heart disease of native coronary artery without angina pectoris: Secondary | ICD-10-CM | POA: Diagnosis not present

## 2023-08-27 LAB — BASIC METABOLIC PANEL
Anion gap: 11 (ref 5–15)
BUN: 15 mg/dL (ref 8–23)
CO2: 22 mmol/L (ref 22–32)
Calcium: 9.6 mg/dL (ref 8.9–10.3)
Chloride: 105 mmol/L (ref 98–111)
Creatinine, Ser: 1.12 mg/dL (ref 0.61–1.24)
GFR, Estimated: >60 mL/min (ref >60)
Glucose, Bld: 92 mg/dL (ref 70–99)
Potassium: 4.8 mmol/L (ref 3.5–5.1)
Sodium: 138 mmol/L (ref 135–145)

## 2023-08-27 LAB — BRAIN NATRIURETIC PEPTIDE: B Natriuretic Peptide: 46.8 pg/mL (ref 0.0–100.0)

## 2023-08-27 MED ORDER — ASPIRIN 81 MG PO TBEC: 81.0000 mg | DELAYED_RELEASE_TABLET | Freq: Every day | ORAL | 11 refills | Status: AC

## 2023-08-27 MED ORDER — POTASSIUM CHLORIDE CRYS ER 20 MEQ PO TBCR: 20.0000 meq | EXTENDED_RELEASE_TABLET | Freq: Every day | ORAL | 11 refills | Status: DC

## 2023-08-27 MED ORDER — CARVEDILOL 6.25 MG PO TABS
6.2500 mg | ORAL_TABLET | Freq: Two times a day (BID) | ORAL | 11 refills | Status: AC
Start: 2023-08-27 — End: ?

## 2023-08-27 MED ORDER — ATORVASTATIN CALCIUM 80 MG PO TABS: 80.0000 mg | ORAL_TABLET | Freq: Every day | ORAL | 6 refills | Status: AC

## 2023-08-27 MED ORDER — AMLODIPINE BESYLATE 5 MG PO TABS: 5.0000 mg | ORAL_TABLET | Freq: Every day | ORAL | 3 refills | Status: AC

## 2023-08-27 MED ORDER — SPIRONOLACTONE 25 MG PO TABS: 25.0000 mg | ORAL_TABLET | Freq: Every day | ORAL | 6 refills | Status: DC

## 2023-08-27 MED ORDER — DAPAGLIFLOZIN PROPANEDIOL 10 MG PO TABS: 10.0000 mg | ORAL_TABLET | Freq: Every day | ORAL | 11 refills | Status: AC

## 2023-08-27 NOTE — Patient Instructions (Addendum)
Thank you for coming in today  If you had labs drawn today, any labs that are abnormal the clinic will call you No news is good news  Medications: RESTART Aspirin 81 mg 1 tablet daily Increase Spironolactone 25 mg 1 tablet daily  Decrease Amlodipine to 5 mg 1 tablet daily  Stop potassium   Follow up appointments:  Your physician recommends that you schedule a follow-up appointment in:  3-4 months With Dr. Gala Romney  You will receive a reminder letter in the mail a few months in advance. If you don't receive a letter, please call our office to schedule the follow-up appointment.  Your physician has requested that you have an echocardiogram. Echocardiography is a painless test that uses sound waves to create images of your heart. It provides your doctor with information about the size and shape of your heart and how well your heart's chambers and valves are working. This procedure takes approximately one hour. There are no restrictions for this procedure.      Do the following things EVERYDAY: Weigh yourself in the morning before breakfast. Write it down and keep it in a log. Take your medicines as prescribed Eat low salt foods--Limit salt (sodium) to 2000 mg per day.  Stay as active as you can everyday Limit all fluids for the day to less than 2 liters   At the [facility], you and your health needs are our priority. As part of our continuing mission to provide you with exceptional heart care, we have created designated Provider Care Teams. These Care Teams include your primary Cardiologist (physician) and Advanced Practice Providers (APPs- Physician Assistants and Nurse Practitioners) who all work together to provide you with the care you need, when you need it.   You may see any of the following providers on your designated Care Team at your next follow up: Dr Arvilla Meres Dr Marca Ancona Dr. Marcos Eke, NP Robbie Lis, [place] [facility] [place], [place] Brynda Peon, NP Karle Plumber, PharmD   Please be sure to bring in all your medications bottles to every appointment.    Thank you for choosing [facility]  If you have any questions or concerns before your next appointment please send [place] a message through [place] or call our office at [phone].    TO LEAVE A MESSAGE FOR THE NURSE SELECT OPTION 2, PLEASE LEAVE A MESSAGE INCLUDING: YOUR NAME DATE OF BIRTH CALL BACK NUMBER REASON FOR CALL**this is important as we prioritize the call backs  YOU WILL RECEIVE A CALL BACK THE SAME DAY AS LONG AS YOU CALL BEFORE 4:00 PM

## 2023-09-03 ENCOUNTER — Other Ambulatory Visit: Payer: Self-pay | Admitting: Internal Medicine

## 2023-09-03 ENCOUNTER — Ambulatory Visit (HOSPITAL_COMMUNITY)
Admission: RE | Admit: 2023-09-03 | Discharge: 2023-09-03 | Disposition: A | Discharge status: Home / Self Care | Payer: BC Managed Care – PPO | Source: Ambulatory Visit | Attending: Cardiology | Admitting: Cardiology

## 2023-09-03 DIAGNOSIS — I5032 Chronic diastolic (congestive) heart failure: Secondary | ICD-10-CM | POA: Insufficient documentation

## 2023-09-03 DIAGNOSIS — E559 Vitamin D deficiency, unspecified: Secondary | ICD-10-CM

## 2023-09-03 LAB — BASIC METABOLIC PANEL
Anion gap: 6 (ref 5–15)
BUN: 13 mg/dL (ref 8–23)
CO2: 25 mmol/L (ref 22–32)
Calcium: 9.3 mg/dL (ref 8.9–10.3)
Chloride: 108 mmol/L (ref 98–111)
Creatinine, Ser: 1.15 mg/dL (ref 0.61–1.24)
GFR, Estimated: >60 mL/min (ref >60)
Glucose, Bld: 97 mg/dL (ref 70–99)
Potassium: 4.9 mmol/L (ref 3.5–5.1)
Sodium: 139 mmol/L (ref 135–145)

## 2023-09-17 ENCOUNTER — Ambulatory Visit (HOSPITAL_COMMUNITY)
Admission: RE | Admit: 2023-09-17 | Discharge: 2023-09-17 | Disposition: A | Discharge status: Home / Self Care | Payer: BC Managed Care – PPO | Source: Ambulatory Visit | Attending: Internal Medicine | Admitting: Internal Medicine

## 2023-09-17 DIAGNOSIS — I5032 Chronic diastolic (congestive) heart failure: Secondary | ICD-10-CM | POA: Diagnosis not present

## 2023-09-17 LAB — ECHOCARDIOGRAM COMPLETE
Area-P 1/2: 3.26 cm2
S' Lateral: 2.9 cm

## 2023-10-08 DIAGNOSIS — M1611 Unilateral primary osteoarthritis, right hip: Secondary | ICD-10-CM | POA: Diagnosis not present

## 2023-10-08 DIAGNOSIS — M25551 Pain in right hip: Secondary | ICD-10-CM | POA: Diagnosis not present

## 2023-10-08 DIAGNOSIS — M1711 Unilateral primary osteoarthritis, right knee: Secondary | ICD-10-CM | POA: Diagnosis not present

## 2023-10-08 DIAGNOSIS — M25561 Pain in right knee: Secondary | ICD-10-CM | POA: Diagnosis not present

## 2023-10-19 DIAGNOSIS — M48061 Spinal stenosis, lumbar region without neurogenic claudication: Secondary | ICD-10-CM | POA: Diagnosis not present

## 2023-11-12 DIAGNOSIS — M1711 Unilateral primary osteoarthritis, right knee: Secondary | ICD-10-CM | POA: Diagnosis not present

## 2023-11-12 DIAGNOSIS — M1611 Unilateral primary osteoarthritis, right hip: Secondary | ICD-10-CM | POA: Diagnosis not present

## 2023-11-18 DIAGNOSIS — M1611 Unilateral primary osteoarthritis, right hip: Secondary | ICD-10-CM | POA: Diagnosis not present

## 2023-11-23 DIAGNOSIS — B351 Tinea unguium: Secondary | ICD-10-CM | POA: Diagnosis not present

## 2023-12-01 DIAGNOSIS — C61 Malignant neoplasm of prostate: Secondary | ICD-10-CM | POA: Diagnosis not present

## 2023-12-03 ENCOUNTER — Encounter: Payer: Self-pay | Admitting: Internal Medicine

## 2023-12-03 ENCOUNTER — Ambulatory Visit: Payer: BC Managed Care – PPO | Admitting: Internal Medicine

## 2023-12-03 VITALS — BP 129/72 | HR 68 | Ht 69.0 in | Wt 241.2 lb

## 2023-12-03 DIAGNOSIS — E559 Vitamin D deficiency, unspecified: Secondary | ICD-10-CM | POA: Insufficient documentation

## 2023-12-03 DIAGNOSIS — E1169 Type 2 diabetes mellitus with other specified complication: Secondary | ICD-10-CM

## 2023-12-03 DIAGNOSIS — I10 Essential (primary) hypertension: Secondary | ICD-10-CM

## 2023-12-03 DIAGNOSIS — E1159 Type 2 diabetes mellitus with other circulatory complications: Secondary | ICD-10-CM

## 2023-12-03 DIAGNOSIS — E785 Hyperlipidemia, unspecified: Secondary | ICD-10-CM

## 2023-12-03 DIAGNOSIS — E119 Type 2 diabetes mellitus without complications: Secondary | ICD-10-CM

## 2023-12-03 NOTE — Assessment & Plan Note (Signed)
 Noted on labs from November.  He has completed high-dose, weekly vitamin D  supplementation.  Repeat vitamin D  level ordered today.

## 2023-12-03 NOTE — Progress Notes (Signed)
 Established Patient Office Visit  Subjective   Patient ID: Andrew Arnold, male    DOB: 1956/04/01  Age: 68 y.o. MRN: [id number]  Chief Complaint  Patient presents with   Hypertension   Andrew Arnold returns to care today for routine follow-up.  He was last evaluated by me in November 2024.  No medication changes were made at that time, repeat labs ordered, and 61-month follow-up arranged.  In the interim, he has been seen by gastroenterology, CT surgery, cardiology, podiatry, and orthopedic surgery for follow up. There have otherwise been no acute interval events.  Today he reports feeling well and has no acute concerns to discuss.  Past Medical History:  Diagnosis Date   Allergic rhinitis due to pollen    on allergy  shots   Anemia    Arrhythmia    1980s hospitalized for HTN and ?irregular rhythm but no specific dx but resolved during hospitalization   Coronary artery disease    Diabetes mellitus without complication (HCC)    HTN (hypertension)    Hyperlipidemia    Nausea and vomiting 08/01/2021   OSA (obstructive sleep apnea)    mild obstructive sleep apnea with an AHI of 10.8/h with oxygen saturations as low as 85%.   prostate ca 07/2022   Past Surgical History:  Procedure Laterality Date   ANTERIOR CERVICAL DECOMP/DISCECTOMY FUSION N/A 06/03/2021   Procedure: Anterior Cervical Decompression Fusion  Cervical three-four;  Surgeon: Van Gelinas, MD;  Location: [facility] OR;  Service: Neurosurgery;  Laterality: N/A;   colonoscopy  07/14/2003   Dr. Riley Cheadle: normal rectum, sigmoid diverticula in colonic mucosa   COLONOSCOPY N/A 02/15/2014   Procedure: COLONOSCOPY;  Surgeon: Suzette Espy, MD;  Location: [facility] SUITE;  Service: Endoscopy;  Laterality: N/A;  2:00   COLONOSCOPY WITH PROPOFOL  N/A 05/08/2022   Procedure: COLONOSCOPY WITH PROPOFOL ;  Surgeon: Suzette Espy, MD;  Location: [facility];  Service: Endoscopy;  Laterality: N/A;  11:00 am   LYMPHADENECTOMY Bilateral  12/17/2022   Procedure: BILATERAL PELVIC LYMPHADENECTOMY;  Surgeon: Florencio Hunting, MD;  Location: [facility] ORS;  Service: Urology;  Laterality: Bilateral;   PROSTATE BIOPSY     RIGHT/LEFT HEART CATH AND CORONARY ANGIOGRAPHY N/A 05/23/2021   Procedure: RIGHT/LEFT HEART CATH AND CORONARY ANGIOGRAPHY;  Surgeon: Mardell Shade, MD;  Location: [facility];  Service: Cardiovascular;  Laterality: N/A;   ROBOT ASSISTED LAPAROSCOPIC RADICAL PROSTATECTOMY N/A 12/17/2022   Procedure: XI ROBOTIC ASSISTED LAPAROSCOPIC RADICAL PROSTATECTOMY LEVEL 2;  Surgeon: Florencio Hunting, MD;  Location: [facility] ORS;  Service: Urology;  Laterality: N/A;  210 MINUTES NEEDED FOR CASE   Social History   Tobacco Use   Smoking status: Never   Smokeless tobacco: Never  Vaping Use   Vaping status: Never Used  Substance Use Topics   Alcohol use: No   Drug use: No   Family History  Problem Relation Age of Onset   Breast cancer Mother 32   Cirrhosis Father        cirrhosis of the liver; alcohol related   Diabetes Sister    Other Other        neice was on allergy  vaccine when young   Colon cancer Neg Hx    Allergies  Allergen Reactions   Penicillins     "I almost forgot I was allergic to it" cannot define what allergy  is   Quinolones     ATAA (ascending thoracic aortic aneurysm)   Review of Systems  Constitutional:  Negative  for chills and fever.  HENT:  Negative for sore throat.   Respiratory:  Negative for cough and shortness of breath.   Cardiovascular:  Negative for chest pain, palpitations and leg swelling.  Gastrointestinal:  Negative for abdominal pain, blood in stool, constipation, diarrhea, nausea and vomiting.  Genitourinary:  Negative for dysuria and hematuria.  Musculoskeletal:  Negative for myalgias.  Skin:  Negative for itching and rash.  Neurological:  Negative for dizziness and headaches.  Psychiatric/Behavioral:  Negative for depression and suicidal ideas.       Objective:     BP 129/72    Pulse 68   Ht 5\' 9"  (1.753 m)   Wt 241 lb 4 oz (109.4 kg)   SpO2 97%   BMI 35.63 kg/m  BP Readings from Last 3 Encounters:  12/03/23 129/72  08/27/23 120/64  08/04/23 122/75   Physical Exam Vitals reviewed.  Constitutional:      General: He is not in acute distress.    Appearance: Normal appearance. He is obese. He is not ill-appearing.  HENT:     Head: Normocephalic and atraumatic.     Right Ear: External ear normal.     Left Ear: External ear normal.     Nose: Nose normal. No congestion or rhinorrhea.     Mouth/Throat:     Mouth: Mucous membranes are moist.     Pharynx: Oropharynx is clear.  Eyes:     General: No scleral icterus.    Extraocular Movements: Extraocular movements intact.     Conjunctiva/sclera: Conjunctivae normal.     Pupils: Pupils are equal, round, and reactive to light.  Cardiovascular:     Rate and Rhythm: Normal rate and regular rhythm.     Pulses: Normal pulses.     Heart sounds: Murmur heard.  Pulmonary:     Effort: Pulmonary effort is normal.     Breath sounds: Normal breath sounds. No wheezing, rhonchi or rales.  Abdominal:     General: Abdomen is flat. Bowel sounds are normal. There is no distension.     Palpations: Abdomen is soft.     Tenderness: There is no abdominal tenderness.  Musculoskeletal:        General: No swelling or deformity. Normal range of motion.     Cervical back: Normal range of motion.  Skin:    General: Skin is warm and dry.     Capillary Refill: Capillary refill takes less than 2 seconds.  Neurological:     General: No focal deficit present.     Mental Status: He is alert and oriented to person, place, and time.     Motor: No weakness.     Gait: Gait abnormal (Ambulates with a cane).  Psychiatric:        Mood and Affect: Mood normal.        Behavior: Behavior normal.        Thought Content: Thought content normal.   Last CBC Lab Results  Component Value Date   WBC 3.7 (L) 06/28/2023   HGB 12.4 (L)  06/28/2023   HCT 39.5 06/28/2023   MCV 96.6 06/28/2023   MCH 30.3 06/28/2023   RDW 13.8 06/28/2023   PLT 246 06/28/2023   Last metabolic panel Lab Results  Component Value Date   GLUCOSE 97 09/03/2023   NA 139 09/03/2023   K 4.9 09/03/2023   CL 108 09/03/2023   CO2 25 09/03/2023   BUN 13 09/03/2023   CREATININE 1.15 09/03/2023   GFRNONAA >60  09/03/2023   CALCIUM  9.3 09/03/2023   PHOS 3.5 05/15/2021   PROT 7.1 06/04/2023   ALBUMIN 4.5 06/04/2023   LABGLOB 2.6 06/04/2023   BILITOT 0.5 06/04/2023   ALKPHOS 99 06/04/2023   AST 15 06/04/2023   ALT 15 06/04/2023   ANIONGAP 6 09/03/2023   Last lipids Lab Results  Component Value Date   CHOL 155 06/04/2023   HDL 35 (L) 06/04/2023   LDLCALC 98 06/04/2023   TRIG 121 06/04/2023   CHOLHDL 4.4 06/04/2023   Last hemoglobin A1c Lab Results  Component Value Date   HGBA1C 6.2 (H) 06/04/2023   Last thyroid  functions Lab Results  Component Value Date   TSH 0.971 06/04/2023   Last vitamin D  Lab Results  Component Value Date   VD25OH 19.9 (L) 06/04/2023   Last vitamin B12 and Folate Lab Results  Component Value Date   VITAMINB12 608 06/04/2023   FOLATE 7.1 06/04/2023   The 10-year ASCVD risk score (Arnett DK, et al., 2019) is: 34.4%    Assessment & Plan:   Problem List Items Addressed This Visit       Essential hypertension (Chronic)   Remains adequately controlled on current antihypertensive regimen.  No medication changes are indicated today.      DM2 (diabetes mellitus, type 2) (HCC) - Primary (Chronic)   A1c 6.2 on labs from November 2024.  He remains on metformin  1000 mg twice daily and Farxiga  10 mg daily.  Repeat A1c ordered today.      Hyperlipidemia associated with type 2 diabetes mellitus (HCC)   Lipid panel updated in November.  Total cholesterol 155 and LDL 98.  Zetia  10 mg daily was added in light of these results.  He is additionally prescribed atorvastatin  80 mg daily.  Repeat lipid panel ordered  today.      Vitamin D  deficiency   Noted on labs from November.  He has completed high-dose, weekly vitamin D  supplementation.  Repeat vitamin D  level ordered today.      Return in about 6 months (around 06/04/2024).   Tobi Fortes, MD

## 2023-12-03 NOTE — Patient Instructions (Signed)
 It was a pleasure to see you today.  Thank you for giving [place] the opportunity to be involved in your care.  Below is a brief recap of your visit and next steps.  We will plan to see you again in 6 months.  Summary No medication changes today Repeat labs ordered Follow up in 6 months

## 2023-12-03 NOTE — Assessment & Plan Note (Signed)
 A1c 6.2 on labs from November 2024.  He remains on metformin  1000 mg twice daily and Farxiga  10 mg daily.  Repeat A1c ordered today.

## 2023-12-03 NOTE — Assessment & Plan Note (Signed)
 Lipid panel updated in November.  Total cholesterol 155 and LDL 98.  Zetia  10 mg daily was added in light of these results.  He is additionally prescribed atorvastatin  80 mg daily.  Repeat lipid panel ordered today.

## 2023-12-03 NOTE — Assessment & Plan Note (Signed)
 Remains adequately controlled on current antihypertensive regimen.  No medication changes are indicated today.

## 2023-12-04 LAB — LIPID PANEL
Chol/HDL Ratio: 3.2 ratio (ref 0.0–5.0)
Cholesterol, Total: 126 mg/dL (ref 100–199)
HDL: 40 mg/dL (ref >39)
LDL Chol Calc (NIH): 70 mg/dL (ref 0–99)
Triglycerides: 79 mg/dL (ref 0–149)
VLDL Cholesterol Cal: 16 mg/dL (ref 5–40)

## 2023-12-04 LAB — HEMOGLOBIN A1C
Est. average glucose Bld gHb Est-mCnc: 123 mg/dL
Hgb A1c MFr Bld: 5.9 % — ABNORMAL HIGH (ref 4.8–5.6)

## 2023-12-04 LAB — VITAMIN D 25 HYDROXY (VIT D DEFICIENCY, FRACTURES): Vit D, 25-Hydroxy: 27.1 ng/mL — ABNORMAL LOW (ref 30.0–100.0)

## 2023-12-06 ENCOUNTER — Ambulatory Visit: Payer: Self-pay | Admitting: Internal Medicine

## 2023-12-08 DIAGNOSIS — N5201 Erectile dysfunction due to arterial insufficiency: Secondary | ICD-10-CM | POA: Diagnosis not present

## 2023-12-08 DIAGNOSIS — C61 Malignant neoplasm of prostate: Secondary | ICD-10-CM | POA: Diagnosis not present

## 2023-12-08 DIAGNOSIS — N393 Stress incontinence (female) (male): Secondary | ICD-10-CM | POA: Diagnosis not present

## 2024-01-06 ENCOUNTER — Other Ambulatory Visit (HOSPITAL_COMMUNITY): Payer: Self-pay | Admitting: Internal Medicine

## 2024-01-11 DIAGNOSIS — M48061 Spinal stenosis, lumbar region without neurogenic claudication: Secondary | ICD-10-CM | POA: Diagnosis not present

## 2024-04-05 ENCOUNTER — Other Ambulatory Visit: Payer: Self-pay | Admitting: Internal Medicine

## 2024-04-07 ENCOUNTER — Other Ambulatory Visit (HOSPITAL_COMMUNITY): Payer: Self-pay | Admitting: Cardiology

## 2024-04-07 MED ORDER — SPIRONOLACTONE 25 MG PO TABS: 25.0000 mg | ORAL_TABLET | Freq: Every day | ORAL | 6 refills | Status: AC

## 2024-04-17 ENCOUNTER — Other Ambulatory Visit (HOSPITAL_COMMUNITY): Payer: Self-pay

## 2024-04-17 DIAGNOSIS — I5032 Chronic diastolic (congestive) heart failure: Secondary | ICD-10-CM

## 2024-04-17 MED ORDER — PANTOPRAZOLE SODIUM 40 MG PO TBEC: 40.0000 mg | DELAYED_RELEASE_TABLET | Freq: Every day | ORAL | 1 refills | Status: DC

## 2024-05-21 DIAGNOSIS — R011 Cardiac murmur, unspecified: Secondary | ICD-10-CM | POA: Diagnosis not present

## 2024-05-21 DIAGNOSIS — J209 Acute bronchitis, unspecified: Secondary | ICD-10-CM | POA: Diagnosis not present

## 2024-05-26 DIAGNOSIS — B351 Tinea unguium: Secondary | ICD-10-CM | POA: Diagnosis not present

## 2024-05-31 DIAGNOSIS — E119 Type 2 diabetes mellitus without complications: Secondary | ICD-10-CM | POA: Diagnosis not present

## 2024-06-05 ENCOUNTER — Ambulatory Visit (INDEPENDENT_AMBULATORY_CARE_PROVIDER_SITE_OTHER)

## 2024-06-05 VITALS — BP 114/69 | HR 74 | Resp 18 | Ht 69.0 in | Wt 240.1 lb

## 2024-06-05 DIAGNOSIS — E1169 Type 2 diabetes mellitus with other specified complication: Secondary | ICD-10-CM

## 2024-06-05 DIAGNOSIS — I10 Essential (primary) hypertension: Secondary | ICD-10-CM | POA: Diagnosis not present

## 2024-06-05 DIAGNOSIS — E785 Hyperlipidemia, unspecified: Secondary | ICD-10-CM

## 2024-06-05 DIAGNOSIS — Z7984 Long term (current) use of oral hypoglycemic drugs: Secondary | ICD-10-CM

## 2024-06-05 DIAGNOSIS — Z23 Encounter for immunization: Secondary | ICD-10-CM

## 2024-06-05 DIAGNOSIS — E119 Type 2 diabetes mellitus without complications: Secondary | ICD-10-CM

## 2024-06-05 DIAGNOSIS — E559 Vitamin D deficiency, unspecified: Secondary | ICD-10-CM

## 2024-06-05 DIAGNOSIS — I5032 Chronic diastolic (congestive) heart failure: Secondary | ICD-10-CM

## 2024-06-05 MED ORDER — METFORMIN HCL 1000 MG PO TABS: 1000.0000 mg | ORAL_TABLET | Freq: Two times a day (BID) | ORAL | 3 refills | Status: AC

## 2024-06-05 MED ORDER — EZETIMIBE 10 MG PO TABS: 10.0000 mg | ORAL_TABLET | Freq: Every day | ORAL | 3 refills | Status: AC

## 2024-06-05 NOTE — Progress Notes (Unsigned)
 Established Patient Office Visit  Subjective   Patient ID: Andrew Arnold, male    DOB: July 01, 1956  Age: 68 y.o. MRN: [id number]  Chief Complaint  Patient presents with   Medical Management of Chronic Issues    6 month follow up     HPI   Patient Active Problem List   Diagnosis Date Noted   Vitamin D  deficiency 12/03/2023   Need for influenza vaccination 06/04/2023   CHF (congestive heart failure) (HCC) 03/04/2023   Hyperlipidemia associated with type 2 diabetes mellitus (HCC) 03/04/2023   Lumbar stenosis 03/04/2023   Prostate cancer (HCC) 12/17/2022   Malignant neoplasm of prostate (HCC) 10/22/2022   OSA (obstructive sleep apnea) 04/23/2022   H/O adenomatous polyp of colon 03/27/2022   Normocytic anemia 03/27/2022   Gait abnormality 03/10/2022   Chronic low back pain 03/10/2022   Paresthesia 03/10/2022   CAP (community acquired pneumonia) 08/01/2021   Nausea and vomiting 08/01/2021   DM2 (diabetes mellitus, type 2) (HCC) 08/01/2021   Coronary artery disease involving native coronary artery of native heart without angina pectoris 07/31/2021   Stenosis of cervical spine with myelopathy (HCC) 06/03/2021   Macrocytic anemia 05/15/2021   Bilateral lower extremity edema 05/15/2021   BPH (benign prostatic hyperplasia) 05/15/2021   Elevated PSA 05/15/2021   Cervical spinal stenosis 05/15/2021   DDD (degenerative disc disease), cervical 05/15/2021   Syncope 05/14/2021   Elevated hemoglobin A1c 04/10/2021   GERD (gastroesophageal reflux disease) 08/29/2017   Encounter for screening colonoscopy 02/01/2014   Essential hypertension 08/14/2008   Seasonal and perennial allergic rhinitis 04/23/2007      ROS    Objective:     BP 114/69   Pulse 74   Resp 18   Ht 5' 9 (1.753 m)   Wt 240 lb 1.3 oz (108.9 kg)   SpO2 96%   BMI 35.45 kg/m  BP Readings from Last 3 Encounters:  06/05/24 114/69  12/03/23 129/72  08/27/23 120/64   Wt Readings from Last 3 Encounters:   06/05/24 240 lb 1.3 oz (108.9 kg)  12/03/23 241 lb 4 oz (109.4 kg)  08/27/23 234 lb 9.6 oz (106.4 kg)      Physical Exam Vitals and nursing note reviewed.  Constitutional:      Appearance: Normal appearance.  HENT:     Head: Normocephalic.     Right Ear: Tympanic membrane, ear canal and external ear normal.     Left Ear: Tympanic membrane, ear canal and external ear normal.     Nose: Nose normal.     Mouth/Throat:     Mouth: Mucous membranes are moist.     Pharynx: Oropharynx is clear.  Eyes:     Extraocular Movements: Extraocular movements intact.     Conjunctiva/sclera: Conjunctivae normal.  Cardiovascular:     Rate and Rhythm: Normal rate and regular rhythm.  Pulmonary:     Effort: Pulmonary effort is normal.     Breath sounds: Normal breath sounds.  Musculoskeletal:        General: Normal range of motion.     Cervical back: Normal range of motion and neck supple.  Skin:    General: Skin is warm and dry.  Neurological:     Mental Status: He is alert and oriented to person, place, and time.  Psychiatric:        Mood and Affect: Mood normal.        Thought Content: Thought content normal.    {Perform Simple Foot Exam  Perform Detailed exam:1} Diabetic foot exam was performed with the following findings:   No deformities, ulcerations, or other skin breakdown Normal sensation of 10g monofilament Intact posterior tibialis and dorsalis pedis pulses      No results found for any visits on 06/05/24.  {Labs (Optional):23779}  The ASCVD Risk score (Arnett DK, et al., 2019) failed to calculate for the following reasons:   The valid total cholesterol range is 130 to 320 mg/dL    Assessment & Plan:   Problem List Items Addressed This Visit   None Visit Diagnoses       Encounter for immunization    -  Primary   Relevant Orders   Flu vaccine HIGH DOSE PF(Fluzone Trivalent) (Completed)       No follow-ups on file.    Leita Longs, FNP

## 2024-06-07 DIAGNOSIS — E785 Hyperlipidemia, unspecified: Secondary | ICD-10-CM | POA: Diagnosis not present

## 2024-06-07 DIAGNOSIS — I10 Essential (primary) hypertension: Secondary | ICD-10-CM | POA: Diagnosis not present

## 2024-06-07 DIAGNOSIS — E119 Type 2 diabetes mellitus without complications: Secondary | ICD-10-CM | POA: Diagnosis not present

## 2024-06-07 DIAGNOSIS — E559 Vitamin D deficiency, unspecified: Secondary | ICD-10-CM | POA: Diagnosis not present

## 2024-06-07 DIAGNOSIS — E1169 Type 2 diabetes mellitus with other specified complication: Secondary | ICD-10-CM | POA: Diagnosis not present

## 2024-06-07 MED ORDER — PANTOPRAZOLE SODIUM 40 MG PO TBEC: 40.0000 mg | DELAYED_RELEASE_TABLET | Freq: Every day | ORAL | 5 refills | Status: AC

## 2024-06-07 NOTE — Assessment & Plan Note (Signed)
 Remains adequately controlled on current antihypertensive regimen.  No medication changes are indicated today.

## 2024-06-07 NOTE — Assessment & Plan Note (Signed)
 He has completed high-dose, weekly vitamin D  supplementation.  Repeat vitamin D  level ordered today.

## 2024-06-07 NOTE — Assessment & Plan Note (Signed)
 A1c 6.2 on labs from November 2024.  He remains on metformin  1000 mg twice daily and Farxiga  10 mg daily.  Repeat A1c ordered today.

## 2024-06-07 NOTE — Assessment & Plan Note (Signed)
 He is prescribed atorvastatin  80 mg daily.  Repeat lipid panel ordered today.

## 2024-06-10 LAB — CBC WITH DIFFERENTIAL/PLATELET
Basophils Absolute: 0 x10E3/uL (ref 0.0–0.2)
Basos: 1 %
EOS (ABSOLUTE): 0.1 x10E3/uL (ref 0.0–0.4)
Eos: 2 %
Hematocrit: 34.8 % — ABNORMAL LOW (ref 37.5–51.0)
Hemoglobin: 10.9 g/dL — ABNORMAL LOW (ref 13.0–17.7)
Immature Grans (Abs): 0 x10E3/uL (ref 0.0–0.1)
Immature Granulocytes: 0 %
Lymphocytes Absolute: 1.2 x10E3/uL (ref 0.7–3.1)
Lymphs: 26 %
MCH: 31.1 pg (ref 26.6–33.0)
MCHC: 31.3 g/dL — ABNORMAL LOW (ref 31.5–35.7)
MCV: 99 fL — ABNORMAL HIGH (ref 79–97)
Monocytes Absolute: 0.4 x10E3/uL (ref 0.1–0.9)
Monocytes: 10 %
Neutrophils Absolute: 2.7 x10E3/uL (ref 1.4–7.0)
Neutrophils: 61 %
Platelets: 276 x10E3/uL (ref 150–450)
RBC: 3.51 x10E6/uL — ABNORMAL LOW (ref 4.14–5.80)
RDW: 13.3 % (ref 11.6–15.4)
WBC: 4.5 x10E3/uL (ref 3.4–10.8)

## 2024-06-10 LAB — CMP14+EGFR
ALT: 17 IU/L (ref 0–44)
AST: 14 IU/L (ref 0–40)
Albumin: 4.2 g/dL (ref 3.9–4.9)
Alkaline Phosphatase: 77 IU/L (ref 47–123)
BUN/Creatinine Ratio: 19 (ref 10–24)
BUN: 33 mg/dL — ABNORMAL HIGH (ref 8–27)
Bilirubin Total: 0.3 mg/dL (ref 0.0–1.2)
CO2: 22 mmol/L (ref 20–29)
Calcium: 9.5 mg/dL (ref 8.6–10.2)
Chloride: 105 mmol/L (ref 96–106)
Creatinine, Ser: 1.76 mg/dL — ABNORMAL HIGH (ref 0.76–1.27)
Globulin, Total: 2.5 g/dL (ref 1.5–4.5)
Glucose: 87 mg/dL (ref 70–99)
Potassium: 5.6 mmol/L — ABNORMAL HIGH (ref 3.5–5.2)
Sodium: 141 mmol/L (ref 134–144)
Total Protein: 6.7 g/dL (ref 6.0–8.5)
eGFR: 41 mL/min/1.73 — ABNORMAL LOW (ref >59)

## 2024-06-10 LAB — HEMOGLOBIN A1C
Est. average glucose Bld gHb Est-mCnc: 126 mg/dL
Hgb A1c MFr Bld: 6 % — ABNORMAL HIGH (ref 4.8–5.6)

## 2024-06-10 LAB — LIPID PANEL
Chol/HDL Ratio: 3.2 ratio (ref 0.0–5.0)
Cholesterol, Total: 112 mg/dL (ref 100–199)
HDL: 35 mg/dL — ABNORMAL LOW (ref >39)
LDL Chol Calc (NIH): 60 mg/dL (ref 0–99)
Triglycerides: 87 mg/dL (ref 0–149)
VLDL Cholesterol Cal: 17 mg/dL (ref 5–40)

## 2024-06-10 LAB — MICROALBUMIN / CREATININE URINE RATIO
Creatinine, Urine: 230.5 mg/dL
Microalb/Creat Ratio: 32 mg/g{creat} — ABNORMAL HIGH (ref 0–29)
Microalbumin, Urine: 73 ug/mL

## 2024-06-10 LAB — VITAMIN D 25 HYDROXY (VIT D DEFICIENCY, FRACTURES): Vit D, 25-Hydroxy: 26.4 ng/mL — ABNORMAL LOW (ref 30.0–100.0)

## 2024-06-21 ENCOUNTER — Other Ambulatory Visit: Payer: Self-pay | Admitting: Thoracic Surgery (Cardiothoracic Vascular Surgery)

## 2024-06-21 DIAGNOSIS — I7121 Aneurysm of the ascending aorta, without rupture: Secondary | ICD-10-CM

## 2024-07-03 ENCOUNTER — Ambulatory Visit (HOSPITAL_COMMUNITY)
Admission: RE | Admit: 2024-07-03 | Discharge: 2024-07-03 | Disposition: A | Discharge status: Home / Self Care | Source: Ambulatory Visit | Attending: Cardiovascular Disease | Admitting: Cardiovascular Disease

## 2024-07-03 DIAGNOSIS — I7121 Aneurysm of the ascending aorta, without rupture: Secondary | ICD-10-CM | POA: Diagnosis not present

## 2024-07-03 DIAGNOSIS — I3139 Other pericardial effusion (noninflammatory): Secondary | ICD-10-CM | POA: Diagnosis not present

## 2024-07-03 MED ORDER — IOHEXOL 350 MG/ML SOLN
100.0000 mL | Freq: Once | INTRAVENOUS | Status: AC | PRN
  Administered 2024-07-03: 100 mL via INTRAVENOUS

## 2024-07-13 ENCOUNTER — Encounter: Payer: Self-pay | Admitting: Gastroenterology

## 2024-07-27 ENCOUNTER — Other Ambulatory Visit (HOSPITAL_COMMUNITY)

## 2024-07-27 NOTE — Progress Notes (Signed)
 Andrew Arnold                                          MRN: [id number]   07/27/2024   The [facility] Quality Team Specialist reviewed this patient medical record for the purposes of chart review for care gap closure. The following were reviewed: chart review for care gap closure-kidney health evaluation for diabetes:eGFR  and uACR.    [facility] Quality Team

## 2024-08-10 ENCOUNTER — Ambulatory Visit

## 2024-08-10 VITALS — BP 146/78 | HR 63 | Resp 20 | Ht 68.0 in | Wt 244.8 lb

## 2024-08-10 DIAGNOSIS — I7121 Aneurysm of the ascending aorta, without rupture: Secondary | ICD-10-CM | POA: Diagnosis present

## 2024-08-10 DIAGNOSIS — I712 Thoracic aortic aneurysm, without rupture, unspecified: Secondary | ICD-10-CM | POA: Insufficient documentation

## 2024-08-10 NOTE — Patient Instructions (Signed)
 Risk Modification in those with ascending thoracic aortic aneurysm:   Continue control of blood pressure (prefer BP 130/80 or less)   2. Avoid fluoroquinolone antibiotics (I.e Ciprofloxacin, Avelox, Levofloxacin, Ofloxacin)   3.  Use of statin (to decrease cardiovascular risk)   4.  Exercise and activity limitations is individualized, but in general, contact sports are to be avoided and one should avoid heavy lifting (defined as half of ideal body weight) and exercises involving sustained Valsalva maneuver.   5.  Follow-up in one year with CTA chest.  OK to use a non-contrast CT if you have had a recent study for surveillance of the lung nodule.

## 2024-08-10 NOTE — Progress Notes (Signed)
 "      [address] [place] [address]             [phone]            Andrew Arnold [id number] 1956/05/02   History of Present Illness:  Andrew Arnold is a 69 year old man with medical history of hypertension, coronary artery disease, congestive heart failure, OSA, GERD, type 2 diabetes, hyperlipidemia, stenosis of cervical spine, degenerative disc disease, BPH, and prostate cancer who returns for continued surveillance of ascending thoracic aortic aneurysm. Aneurysm has remained stable in size and on recent CTA of chest measured 4.3 cm. Echocardiogram on 09/2023 showed that the aortic valve is tricuspid.  The aortic valve had mild calcification without aortic regurgitation or aortic stenosis.   He reports that he has been doing well.  His blood pressure is elevated at today's visit. He is compliant with his medications. 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  He had a steroid injection which was helpful.  He would like to start water  aerobics again. He denies chest pain, shortness of breath and palpations.     Medications Ordered Prior to Encounter[1]   ROS: Review of Systems  Constitutional:  Negative for malaise/fatigue.  Respiratory: Negative.  Negative for cough and shortness of breath.   Cardiovascular: Negative.  Negative for chest pain and leg swelling.     BP (!) 146/78 (BP Location: Left Arm, Patient Position: Sitting, Cuff Size: Large)   Pulse 63   Resp 20   Ht 5' 8 (1.727 m)   Wt 244 lb 12.8 oz (111 kg)   SpO2 98% Comment: RA  BMI 37.22 kg/m   Physical Exam Constitutional:      Appearance: Normal appearance.  HENT:     Head: Normocephalic and atraumatic.  Skin:    General: Skin is warm and dry.  Neurological:     General: No focal deficit present.     Mental Status: He is alert and oriented to person, place, and time.      Imaging: CLINICAL DATA:  Aortic aneurysm  suspected.   EXAM: CT ANGIOGRAPHY CHEST WITH CONTRAST   TECHNIQUE: Multidetector CT imaging of the chest was performed using the standard protocol during bolus administration of intravenous contrast. Multiplanar CT image reconstructions and MIPs were obtained to evaluate the vascular anatomy.   RADIATION DOSE REDUCTION: This exam was performed according to the departmental dose-optimization program which includes automated exposure control, adjustment of the mA and/or kV according to patient size and/or use of iterative reconstruction technique.   CONTRAST:  OMNIPAQUE  IOHEXOL  350 MG/ML SOLN   COMPARISON:  Chest CT dated 08/04/2023.   FINDINGS: Cardiovascular: There is no cardiomegaly. Small pericardial effusion. There is mild atherosclerotic calcification of the thoracic aorta. Mildly dilated ascending aorta measures 4.3 cm in caliber. No aortic dissection. No pulmonary artery embolus identified.   Mediastinum/Nodes: No hilar or mediastinal adenopathy. Small hiatal hernia. The esophagus is grossly unremarkable. No mediastinal fluid collection.   Lungs/Pleura: The lungs are clear. There is no pleural effusion or pneumothorax. The central airways are patent.   Upper Abdomen: No acute abnormality.   Musculoskeletal: Degenerative changes of the spine. No acute osseous pathology.   Review of the MIP images confirms the above findings.   IMPRESSION: 1. No acute intrathoracic pathology. No CT evidence of pulmonary artery embolus. 2. A 4.3 cm aneurysmal dilatation of  the ascending aorta. Recommend annual imaging followup by CTA or MRA. This recommendation follows 2010 ACCF/AHA/AATS/ACR/ASA/SCA/SCAI/SIR/STS/SVM Guidelines for the Diagnosis and Management of Patients with Thoracic Aortic Disease. Circulation. 2010; 121: Z733-z630. Aortic aneurysm NOS (ICD10-I71.9) 3.  Aortic Atherosclerosis (ICD10-I70.0).     Electronically Signed   By: Vanetta Chou M.D.   On:  07/03/2024 16:42   A/P: Aneurysm of ascending aorta without rupture -4.3 cm ascending thoracic aortic aneurysm on CTA of chest. Echocardiogram showed tricuspid aortic valve.  -We discussed the natural history and and risk factors for growth of ascending aortic aneurysms. Discussed recommendations to minimize the risk of further expansion or dissection including careful blood pressure control, avoidance of contact sports and heavy lifting, attention to lipid management.  We covered the importance of staying never user of tobacco.  The patient does not yet meet surgical criteria of >5.5cm. The patient is aware of signs and symptoms of aortic dissection and when to present to the emergency department   -Follow up in one year with CTA of chest for continued surveillance    Risk Modification:  Statin:  atorvastatin    Smoking cessation instruction/counseling given:  never user  Patient was counseled on importance of Blood Pressure Control  They are instructed to contact their Primary Care Physician if they start to have blood pressure readings over 130s/90s. Do not ever stop blood pressure medications on your own, unless instructed by healthcare professional.  Please avoid use of Fluoroquinolones as this can potentially increase your risk of Aortic Rupture and/or Dissection  Patient educated on signs and symptoms of Aortic Dissection, handout also provided in AVS  Andrew CHRISTELLA Rough, PA-C 08/11/24     [1]  Current Outpatient Medications on File Prior to Visit  Medication Sig Dispense Refill   acetaminophen  (TYLENOL ) 500 MG tablet Take 500 mg by mouth as needed.     amLODipine  (NORVASC ) 5 MG tablet Take 1 tablet (5 mg total) by mouth daily. 180 tablet 3   aspirin  EC 81 MG tablet Take 1 tablet (81 mg total) by mouth daily. Swallow whole. 30 tablet 11   atorvastatin  (LIPITOR ) 80 MG tablet Take 1 tablet (80 mg total) by mouth at bedtime. 30 tablet 6   carvedilol  (COREG ) 6.25 MG tablet Take 1  tablet (6.25 mg total) by mouth 2 (two) times daily with a meal. 60 tablet 11   cetirizine (ZYRTEC) 10 MG tablet Take 10 mg by mouth in the morning.     dapagliflozin  propanediol (FARXIGA ) 10 MG TABS tablet Take 1 tablet (10 mg total) by mouth daily. 30 tablet 11   ezetimibe  (ZETIA ) 10 MG tablet Take 1 tablet (10 mg total) by mouth daily. 90 tablet 3   ferrous sulfate 325 (65 FE) MG tablet Take 325 mg by mouth daily with breakfast.     metFORMIN  (GLUCOPHAGE ) 1000 MG tablet Take 1 tablet (1,000 mg total) by mouth 2 (two) times daily with a meal. 180 tablet 3   pantoprazole  (PROTONIX ) 40 MG tablet Take 1 tablet (40 mg total) by mouth daily. 60 tablet 5   sacubitril -valsartan  (ENTRESTO ) 24-26 MG TAKE 1 TABLET BY MOUTH TWICE DAILY 180 tablet 3   sildenafil (VIAGRA) 100 MG tablet Take 100 mg by mouth daily as needed.     spironolactone  (ALDACTONE ) 25 MG tablet Take 1 tablet (25 mg total) by mouth daily. 30 tablet 6   No current facility-administered medications on file prior to visit.   "

## 2024-09-11 ENCOUNTER — Ambulatory Visit

## 2024-12-05 ENCOUNTER — Ambulatory Visit
